# Patient Record
Sex: Male | Born: 1950 | Race: White | Hispanic: No | Marital: Single | Smoking: Current every day smoker
Health system: Southern US, Community
[De-identification: ages and names within clinical notes are randomized; demographics above are authoritative.]

## PROBLEM LIST (undated history)

## (undated) DIAGNOSIS — K219 Gastro-esophageal reflux disease without esophagitis: Secondary | ICD-10-CM

## (undated) DIAGNOSIS — N182 Chronic kidney disease, stage 2 (mild): Secondary | ICD-10-CM

## (undated) DIAGNOSIS — F329 Major depressive disorder, single episode, unspecified: Secondary | ICD-10-CM

## (undated) DIAGNOSIS — J449 Chronic obstructive pulmonary disease, unspecified: Secondary | ICD-10-CM

## (undated) DIAGNOSIS — I2510 Atherosclerotic heart disease of native coronary artery without angina pectoris: Secondary | ICD-10-CM

## (undated) DIAGNOSIS — Z95810 Presence of automatic (implantable) cardiac defibrillator: Secondary | ICD-10-CM

## (undated) DIAGNOSIS — F32A Depression, unspecified: Secondary | ICD-10-CM

## (undated) DIAGNOSIS — I471 Supraventricular tachycardia: Secondary | ICD-10-CM

## (undated) DIAGNOSIS — I4891 Unspecified atrial fibrillation: Secondary | ICD-10-CM

## (undated) DIAGNOSIS — E785 Hyperlipidemia, unspecified: Secondary | ICD-10-CM

## (undated) DIAGNOSIS — G20 Parkinson's disease: Secondary | ICD-10-CM

## (undated) DIAGNOSIS — S72001A Fracture of unspecified part of neck of right femur, initial encounter for closed fracture: Secondary | ICD-10-CM

## (undated) DIAGNOSIS — E1165 Type 2 diabetes mellitus with hyperglycemia: Secondary | ICD-10-CM

## (undated) DIAGNOSIS — I4821 Permanent atrial fibrillation: Secondary | ICD-10-CM

## (undated) DIAGNOSIS — E1140 Type 2 diabetes mellitus with diabetic neuropathy, unspecified: Secondary | ICD-10-CM

## (undated) DIAGNOSIS — I739 Peripheral vascular disease, unspecified: Secondary | ICD-10-CM

## (undated) DIAGNOSIS — R4182 Altered mental status, unspecified: Secondary | ICD-10-CM

## (undated) DIAGNOSIS — E119 Type 2 diabetes mellitus without complications: Secondary | ICD-10-CM

## (undated) DIAGNOSIS — I119 Hypertensive heart disease without heart failure: Secondary | ICD-10-CM

## (undated) DIAGNOSIS — Z09 Encounter for follow-up examination after completed treatment for conditions other than malignant neoplasm: Secondary | ICD-10-CM

## (undated) DIAGNOSIS — A0472 Enterocolitis due to Clostridium difficile, not specified as recurrent: Secondary | ICD-10-CM

## (undated) DIAGNOSIS — Z96642 Presence of left artificial hip joint: Secondary | ICD-10-CM

## (undated) DIAGNOSIS — Z9889 Other specified postprocedural states: Secondary | ICD-10-CM

---

## 2015-06-25 ENCOUNTER — Encounter (HOSPITAL_COMMUNITY): Payer: Self-pay

## 2015-06-25 ENCOUNTER — Inpatient Hospital Stay (HOSPITAL_COMMUNITY)
Admission: AD | Admit: 2015-06-25 | Discharge: 2015-07-01 | DRG: 469 | Disposition: A | Discharge status: Skilled Nursing Facility | Payer: Medicare PPO | Source: Other Acute Inpatient Hospital | Attending: Internal Medicine | Admitting: Internal Medicine

## 2015-06-25 ENCOUNTER — Inpatient Hospital Stay (HOSPITAL_COMMUNITY): Payer: Medicare PPO

## 2015-06-25 ENCOUNTER — Encounter (HOSPITAL_COMMUNITY): Payer: Self-pay | Admitting: Pulmonary Disease

## 2015-06-25 DIAGNOSIS — R6510 Systemic inflammatory response syndrome (SIRS) of non-infectious origin without acute organ dysfunction: Secondary | ICD-10-CM | POA: Diagnosis present

## 2015-06-25 DIAGNOSIS — I82412 Acute embolism and thrombosis of left femoral vein: Secondary | ICD-10-CM | POA: Diagnosis present

## 2015-06-25 DIAGNOSIS — J449 Chronic obstructive pulmonary disease, unspecified: Secondary | ICD-10-CM | POA: Diagnosis present

## 2015-06-25 DIAGNOSIS — F329 Major depressive disorder, single episode, unspecified: Secondary | ICD-10-CM | POA: Diagnosis present

## 2015-06-25 DIAGNOSIS — S72009A Fracture of unspecified part of neck of unspecified femur, initial encounter for closed fracture: Secondary | ICD-10-CM

## 2015-06-25 DIAGNOSIS — E782 Mixed hyperlipidemia: Secondary | ICD-10-CM | POA: Diagnosis present

## 2015-06-25 DIAGNOSIS — G8918 Other acute postprocedural pain: Secondary | ICD-10-CM | POA: Diagnosis not present

## 2015-06-25 DIAGNOSIS — K5900 Constipation, unspecified: Secondary | ICD-10-CM | POA: Diagnosis not present

## 2015-06-25 DIAGNOSIS — R0600 Dyspnea, unspecified: Secondary | ICD-10-CM | POA: Diagnosis present

## 2015-06-25 DIAGNOSIS — W19XXXS Unspecified fall, sequela: Secondary | ICD-10-CM | POA: Diagnosis present

## 2015-06-25 DIAGNOSIS — I2699 Other pulmonary embolism without acute cor pulmonale: Secondary | ICD-10-CM | POA: Diagnosis present

## 2015-06-25 DIAGNOSIS — G20A1 Parkinson's disease without dyskinesia, without mention of fluctuations: Secondary | ICD-10-CM | POA: Diagnosis present

## 2015-06-25 DIAGNOSIS — G20 Parkinson's disease: Secondary | ICD-10-CM | POA: Diagnosis present

## 2015-06-25 DIAGNOSIS — S72001A Fracture of unspecified part of neck of right femur, initial encounter for closed fracture: Principal | ICD-10-CM | POA: Diagnosis present

## 2015-06-25 DIAGNOSIS — I482 Chronic atrial fibrillation: Secondary | ICD-10-CM | POA: Diagnosis present

## 2015-06-25 DIAGNOSIS — N182 Chronic kidney disease, stage 2 (mild): Secondary | ICD-10-CM | POA: Diagnosis present

## 2015-06-25 DIAGNOSIS — K760 Fatty (change of) liver, not elsewhere classified: Secondary | ICD-10-CM | POA: Diagnosis present

## 2015-06-25 DIAGNOSIS — Z7982 Long term (current) use of aspirin: Secondary | ICD-10-CM

## 2015-06-25 DIAGNOSIS — I2601 Septic pulmonary embolism with acute cor pulmonale: Secondary | ICD-10-CM | POA: Diagnosis not present

## 2015-06-25 DIAGNOSIS — J9601 Acute respiratory failure with hypoxia: Secondary | ICD-10-CM | POA: Diagnosis present

## 2015-06-25 DIAGNOSIS — D62 Acute posthemorrhagic anemia: Secondary | ICD-10-CM | POA: Diagnosis present

## 2015-06-25 DIAGNOSIS — R55 Syncope and collapse: Secondary | ICD-10-CM | POA: Diagnosis present

## 2015-06-25 DIAGNOSIS — E1122 Type 2 diabetes mellitus with diabetic chronic kidney disease: Secondary | ICD-10-CM | POA: Diagnosis present

## 2015-06-25 DIAGNOSIS — Z95810 Presence of automatic (implantable) cardiac defibrillator: Secondary | ICD-10-CM | POA: Diagnosis not present

## 2015-06-25 DIAGNOSIS — E872 Acidosis: Secondary | ICD-10-CM | POA: Diagnosis present

## 2015-06-25 DIAGNOSIS — K219 Gastro-esophageal reflux disease without esophagitis: Secondary | ICD-10-CM | POA: Diagnosis present

## 2015-06-25 DIAGNOSIS — I129 Hypertensive chronic kidney disease with stage 1 through stage 4 chronic kidney disease, or unspecified chronic kidney disease: Secondary | ICD-10-CM | POA: Diagnosis present

## 2015-06-25 DIAGNOSIS — Z79899 Other long term (current) drug therapy: Secondary | ICD-10-CM

## 2015-06-25 DIAGNOSIS — I471 Supraventricular tachycardia: Secondary | ICD-10-CM | POA: Diagnosis not present

## 2015-06-25 DIAGNOSIS — I2510 Atherosclerotic heart disease of native coronary artery without angina pectoris: Secondary | ICD-10-CM | POA: Diagnosis present

## 2015-06-25 DIAGNOSIS — R269 Unspecified abnormalities of gait and mobility: Secondary | ICD-10-CM | POA: Diagnosis not present

## 2015-06-25 DIAGNOSIS — S72141D Displaced intertrochanteric fracture of right femur, subsequent encounter for closed fracture with routine healing: Secondary | ICD-10-CM | POA: Diagnosis not present

## 2015-06-25 DIAGNOSIS — I2782 Chronic pulmonary embolism: Secondary | ICD-10-CM

## 2015-06-25 DIAGNOSIS — F17200 Nicotine dependence, unspecified, uncomplicated: Secondary | ICD-10-CM | POA: Diagnosis present

## 2015-06-25 DIAGNOSIS — I82402 Acute embolism and thrombosis of unspecified deep veins of left lower extremity: Secondary | ICD-10-CM | POA: Diagnosis not present

## 2015-06-25 HISTORY — DX: Atherosclerotic heart disease of native coronary artery without angina pectoris: I25.10

## 2015-06-25 HISTORY — DX: Chronic kidney disease, stage 2 (mild): N18.2

## 2015-06-25 HISTORY — DX: Parkinson's disease: G20

## 2015-06-25 HISTORY — DX: Supraventricular tachycardia: I47.1

## 2015-06-25 HISTORY — DX: Fracture of unspecified part of neck of right femur, initial encounter for closed fracture: S72.001A

## 2015-06-25 HISTORY — DX: Chronic obstructive pulmonary disease, unspecified: J44.9

## 2015-06-25 HISTORY — DX: Gastro-esophageal reflux disease without esophagitis: K21.9

## 2015-06-25 HISTORY — DX: Hyperlipidemia, unspecified: E78.5

## 2015-06-25 HISTORY — DX: Depression, unspecified: F32.A

## 2015-06-25 HISTORY — DX: Presence of automatic (implantable) cardiac defibrillator: Z95.810

## 2015-06-25 HISTORY — DX: Unspecified atrial fibrillation: I48.91

## 2015-06-25 HISTORY — DX: Major depressive disorder, single episode, unspecified: F32.9

## 2015-06-25 LAB — BASIC METABOLIC PANEL
Anion gap: 8 (ref 5–15)
BUN: 21 mg/dL — AB (ref 6–20)
CALCIUM: 8.4 mg/dL — AB (ref 8.9–10.3)
CHLORIDE: 101 mmol/L (ref 101–111)
CO2: 27 mmol/L (ref 22–32)
CREATININE: 1.28 mg/dL — AB (ref 0.61–1.24)
GFR calc Af Amer: >60 mL/min (ref >60)
GFR calc non Af Amer: 57 mL/min — ABNORMAL LOW (ref >60)
Glucose, Bld: 132 mg/dL — ABNORMAL HIGH (ref 65–99)
Potassium: 4.3 mmol/L (ref 3.5–5.1)
SODIUM: 136 mmol/L (ref 135–145)

## 2015-06-25 LAB — CBC
HCT: 41.2 % (ref 39.0–52.0)
HEMOGLOBIN: 13.6 g/dL (ref 13.0–17.0)
MCH: 31.6 pg (ref 26.0–34.0)
MCHC: 33 g/dL (ref 30.0–36.0)
MCV: 95.8 fL (ref 78.0–100.0)
PLATELETS: 201 10*3/uL (ref 150–400)
RBC: 4.3 MIL/uL (ref 4.22–5.81)
RDW: 13.2 % (ref 11.5–15.5)
WBC: 11.8 10*3/uL — ABNORMAL HIGH (ref 4.0–10.5)

## 2015-06-25 LAB — GLUCOSE, CAPILLARY
GLUCOSE-CAPILLARY: 127 mg/dL — AB (ref 65–99)
Glucose-Capillary: 127 mg/dL — ABNORMAL HIGH (ref 65–99)

## 2015-06-25 LAB — COMPREHENSIVE METABOLIC PANEL
ALBUMIN: 3.3 g/dL — AB (ref 3.5–5.0)
ALT: 22 U/L (ref 17–63)
ANION GAP: 8 (ref 5–15)
AST: 22 U/L (ref 15–41)
Alkaline Phosphatase: 32 U/L — ABNORMAL LOW (ref 38–126)
BUN: 22 mg/dL — ABNORMAL HIGH (ref 6–20)
CHLORIDE: 103 mmol/L (ref 101–111)
CO2: 26 mmol/L (ref 22–32)
Calcium: 8.5 mg/dL — ABNORMAL LOW (ref 8.9–10.3)
Creatinine, Ser: 1.26 mg/dL — ABNORMAL HIGH (ref 0.61–1.24)
GFR calc non Af Amer: 58 mL/min — ABNORMAL LOW (ref >60)
GLUCOSE: 140 mg/dL — AB (ref 65–99)
Potassium: 4.1 mmol/L (ref 3.5–5.1)
SODIUM: 137 mmol/L (ref 135–145)
Total Bilirubin: 0.9 mg/dL (ref 0.3–1.2)
Total Protein: 6.2 g/dL — ABNORMAL LOW (ref 6.5–8.1)

## 2015-06-25 LAB — HEPARIN LEVEL (UNFRACTIONATED)
Heparin Unfractionated: 0.17 IU/mL — ABNORMAL LOW (ref 0.30–0.70)
Heparin Unfractionated: 0.51 IU/mL (ref 0.30–0.70)
Heparin Unfractionated: 0.37 IU/mL (ref 0.30–0.70)

## 2015-06-25 LAB — LACTIC ACID, PLASMA: LACTIC ACID, VENOUS: 1.4 mmol/L (ref 0.5–2.0)

## 2015-06-25 LAB — PHOSPHORUS: PHOSPHORUS: 3.1 mg/dL (ref 2.5–4.6)

## 2015-06-25 LAB — TROPONIN I
TROPONIN I: 0.06 ng/mL — AB (ref <0.031)
TROPONIN I: 0.06 ng/mL — AB (ref <0.031)
TROPONIN I: 0.04 ng/mL — AB (ref <0.031)

## 2015-06-25 LAB — MAGNESIUM: Magnesium: 1.9 mg/dL (ref 1.7–2.4)

## 2015-06-25 LAB — ANTITHROMBIN III: AntiThromb III Func: 88 % (ref 75–120)

## 2015-06-25 LAB — BRAIN NATRIURETIC PEPTIDE: B Natriuretic Peptide: 200.8 pg/mL — ABNORMAL HIGH (ref 0.0–100.0)

## 2015-06-25 LAB — MRSA PCR SCREENING: MRSA BY PCR: NEGATIVE

## 2015-06-25 MED ORDER — FENTANYL CITRATE (PF) 100 MCG/2ML IJ SOLN
25.0000 ug | INTRAMUSCULAR | Status: DC | PRN
  Administered 2015-06-25: 100 ug via INTRAVENOUS
  Filled 2015-06-25: qty 2

## 2015-06-25 MED ORDER — AMIODARONE HCL 100 MG PO TABS
200.0000 mg | ORAL_TABLET | Freq: Every day | ORAL | Status: DC
  Administered 2015-06-25 – 2015-07-01 (×7): 200 mg via ORAL
  Filled 2015-06-25: qty 2
  Filled 2015-06-25 (×2): qty 1
  Filled 2015-06-25: qty 2
  Filled 2015-06-25: qty 1
  Filled 2015-06-25: qty 2
  Filled 2015-06-25: qty 1

## 2015-06-25 MED ORDER — CARVEDILOL 6.25 MG PO TABS
6.2500 mg | ORAL_TABLET | Freq: Two times a day (BID) | ORAL | Status: DC
Start: 2015-06-25 — End: 2015-07-02
  Administered 2015-06-25 – 2015-07-01 (×13): 6.25 mg via ORAL
  Filled 2015-06-25 (×14): qty 1

## 2015-06-25 MED ORDER — SODIUM CHLORIDE 0.9 % IV SOLN
INTRAVENOUS | Status: DC
Start: 2015-06-25 — End: 2015-06-25
  Administered 2015-06-25: 03:00:00 via INTRAVENOUS

## 2015-06-25 MED ORDER — PERFLUTREN LIPID MICROSPHERE
1.0000 mL | INTRAVENOUS | Status: AC | PRN
Start: 2015-06-25 — End: 2015-06-25
  Administered 2015-06-25: 2 mL via INTRAVENOUS
  Filled 2015-06-25 (×2): qty 10

## 2015-06-25 MED ORDER — ASPIRIN EC 325 MG PO TBEC
325.0000 mg | DELAYED_RELEASE_TABLET | Freq: Every day | ORAL | Status: DC
  Administered 2015-06-25 – 2015-07-01 (×7): 325 mg via ORAL
  Filled 2015-06-25 (×8): qty 1

## 2015-06-25 MED ORDER — OMEGA-3-ACID ETHYL ESTERS 1 G PO CAPS
1.0000 g | ORAL_CAPSULE | Freq: Three times a day (TID) | ORAL | Status: DC
  Administered 2015-06-25 – 2015-07-01 (×19): 1 g via ORAL
  Filled 2015-06-25 (×20): qty 1

## 2015-06-25 MED ORDER — HEPARIN BOLUS VIA INFUSION
3000.0000 [IU] | Freq: Once | INTRAVENOUS | Status: AC
  Administered 2015-06-25: 3000 [IU] via INTRAVENOUS
  Filled 2015-06-25: qty 3000

## 2015-06-25 MED ORDER — PANTOPRAZOLE SODIUM 40 MG PO TBEC
40.0000 mg | DELAYED_RELEASE_TABLET | Freq: Every day | ORAL | Status: DC
  Administered 2015-06-25 – 2015-07-01 (×7): 40 mg via ORAL
  Filled 2015-06-25 (×8): qty 1

## 2015-06-25 MED ORDER — MOMETASONE FURO-FORMOTEROL FUM 100-5 MCG/ACT IN AERO
2.0000 | INHALATION_SPRAY | Freq: Two times a day (BID) | RESPIRATORY_TRACT | Status: DC
  Administered 2015-06-25 – 2015-07-01 (×12): 2 via RESPIRATORY_TRACT
  Filled 2015-06-25 (×2): qty 8.8

## 2015-06-25 MED ORDER — MAGNESIUM SULFATE 2 GM/50ML IV SOLN
2.0000 g | Freq: Once | INTRAVENOUS | Status: AC
  Administered 2015-06-25: 2 g via INTRAVENOUS
  Filled 2015-06-25: qty 50

## 2015-06-25 MED ORDER — SPIRONOLACTONE 25 MG PO TABS
25.0000 mg | ORAL_TABLET | Freq: Every day | ORAL | Status: DC
  Administered 2015-06-25 – 2015-07-01 (×7): 25 mg via ORAL
  Filled 2015-06-25 (×7): qty 1

## 2015-06-25 MED ORDER — ONDANSETRON HCL 4 MG/2ML IJ SOLN: 4.0000 mg | Freq: Four times a day (QID) | INTRAMUSCULAR | Status: DC | PRN

## 2015-06-25 MED ORDER — CETYLPYRIDINIUM CHLORIDE 0.05 % MT LIQD
7.0000 mL | Freq: Two times a day (BID) | OROMUCOSAL | Status: DC
  Administered 2015-06-25 – 2015-07-01 (×12): 7 mL via OROMUCOSAL

## 2015-06-25 MED ORDER — TIOTROPIUM BROMIDE MONOHYDRATE 18 MCG IN CAPS
18.0000 ug | ORAL_CAPSULE | Freq: Every day | RESPIRATORY_TRACT | Status: DC
  Administered 2015-06-25 – 2015-07-01 (×7): 18 ug via RESPIRATORY_TRACT
  Filled 2015-06-25 (×2): qty 5

## 2015-06-25 MED ORDER — ACETAMINOPHEN 325 MG PO TABS: 650.0000 mg | ORAL_TABLET | ORAL | Status: DC | PRN

## 2015-06-25 MED ORDER — SIMVASTATIN 20 MG PO TABS
20.0000 mg | ORAL_TABLET | Freq: Every day | ORAL | Status: DC
  Administered 2015-06-25 – 2015-07-01 (×7): 20 mg via ORAL
  Filled 2015-06-25 (×7): qty 1

## 2015-06-25 MED ORDER — SODIUM CHLORIDE 0.9 % IV SOLN: 250.0000 mL | INTRAVENOUS | Status: DC | PRN

## 2015-06-25 MED ORDER — CARBIDOPA-LEVODOPA ER 50-200 MG PO TBCR
2.0000 | EXTENDED_RELEASE_TABLET | Freq: Two times a day (BID) | ORAL | Status: DC
  Administered 2015-06-25 – 2015-06-27 (×5): 2 via ORAL
  Filled 2015-06-25 (×6): qty 2

## 2015-06-25 MED ORDER — MORPHINE SULFATE (PF) 2 MG/ML IV SOLN
2.0000 mg | INTRAVENOUS | Status: DC | PRN
  Administered 2015-06-25 (×2): 4 mg via INTRAVENOUS
  Administered 2015-06-25: 2 mg via INTRAVENOUS
  Administered 2015-06-26 (×2): 4 mg via INTRAVENOUS
  Administered 2015-06-27 – 2015-06-28 (×4): 2 mg via INTRAVENOUS
  Filled 2015-06-25: qty 1
  Filled 2015-06-25: qty 2
  Filled 2015-06-25 (×2): qty 1
  Filled 2015-06-25: qty 2
  Filled 2015-06-25 (×2): qty 1
  Filled 2015-06-25 (×2): qty 2
  Filled 2015-06-25: qty 1

## 2015-06-25 MED ORDER — FENOFIBRATE 160 MG PO TABS
160.0000 mg | ORAL_TABLET | Freq: Every day | ORAL | Status: DC
  Administered 2015-06-25 – 2015-07-01 (×7): 160 mg via ORAL
  Filled 2015-06-25 (×7): qty 1

## 2015-06-25 MED ORDER — HEPARIN (PORCINE) IN NACL 100-0.45 UNIT/ML-% IJ SOLN
2200.0000 [IU]/h | INTRAMUSCULAR | Status: DC
  Administered 2015-06-25 (×2): 2000 [IU]/h via INTRAVENOUS
  Administered 2015-06-25: 1600 [IU]/h via INTRAVENOUS
  Administered 2015-06-26: 2200 [IU]/h via INTRAVENOUS
  Filled 2015-06-25 (×5): qty 250

## 2015-06-25 NOTE — Progress Notes (Signed)
[phone] called Elink MD, pt c/o 8/10 pain in right hip. Pt states fentanyl did not provide relief. Orders given for Morphine 2-4 mg q 3 PRN.

## 2015-06-25 NOTE — Progress Notes (Signed)
eLink Physician-Brief Progress Note Patient Name: Jonathan SosDavid B Stafford DOB: [birth year] MRN: [id number]   Date of Service  06/25/2015  HPI/Events of Note  New arrival from [facility] Syncope today > hip fracture > ER > found to have large PE with RV strain HD stable, but requiring venturi mask for to maintain O2 saturation   eICU Interventions  On ground team to evaluate Will place basic admission orders, heparin per pharmacy, O2, pain meds, basic labs     Intervention Category Evaluation Type: New Patient Evaluation  Max FickleDouglas Jahnyla Parrillo 06/25/2015, 2:26 AM

## 2015-06-25 NOTE — Progress Notes (Signed)
Transferred -in from medical ICU awake and alert.

## 2015-06-25 NOTE — Consult Note (Signed)
ORTHOPAEDIC CONSULTATION  REQUESTING PHYSICIAN: Marshell Garfinkel, MD  Chief Complaint: Right hip fracture.  HPI: Jonathan Stafford is a 64 y.o. male who complains of  a syncopal fall onto his right side with pain. He has a history of PE and is on a heparin drip for this which was discovered a day ago. He has a history of A. fib.  Past Medical History  Diagnosis Date  . Atrial fibrillation ([place])   . CAD (coronary artery disease)   . HLD (hyperlipidemia)   . COPD (chronic obstructive pulmonary disease) ([place])   . Chronic kidney disease (CKD), stage II (mild)   . SVT (supraventricular tachycardia) ([place])   . Presence of combination internal cardiac defibrillator (ICD) and pacemaker   . GERD (gastroesophageal reflux disease)   . Parkinson's disease ([place])   . Depression    History reviewed. No pertinent past surgical history. Social History   Social History  . Marital Status: Single    Spouse Name: N/A  . Number of Children: N/A  . Years of Education: N/A   Social History Main Topics  . Smoking status: Current Every Day Smoker  . Smokeless tobacco: None  . Alcohol Use: None  . Drug Use: None  . Sexual Activity: Not Asked   Other Topics Concern  . None   Social History Narrative   History reviewed. No pertinent family history. No Known Allergies Prior to Admission medications   Medication Sig Start Date End Date Taking? Authorizing Provider  amiodarone (PACERONE) 200 MG tablet Take 200 mg by mouth daily.   Yes Historical Provider, MD  aspirin 325 MG EC tablet Take 325 mg by mouth daily.   Yes Historical Provider, MD  carbidopa-levodopa (SINEMET CR) 50-200 MG tablet Take 2 tablets by mouth 3 (three) times daily.    Yes Historical Provider, MD  carvedilol (COREG) 6.25 MG tablet Take 6.25 mg by mouth 2 (two) times daily with a meal.   Yes Historical Provider, MD  fenofibrate (TRICOR) 145 MG tablet Take 145 mg by mouth daily.   Yes Historical Provider, MD    Fluticasone-Salmeterol (ADVAIR) 250-50 MCG/DOSE AEPB Inhale 1 puff into the lungs 2 (two) times daily.   Yes Historical Provider, MD  omega-3 acid ethyl esters (LOVAZA) 1 G capsule Take 1 g by mouth 3 (three) times daily.   Yes Historical Provider, MD  pantoprazole (PROTONIX) 40 MG tablet Take 40 mg by mouth daily.   Yes Historical Provider, MD  ranolazine (RANEXA) 500 MG 12 hr tablet Take 500 mg by mouth 2 (two) times daily.   Yes Historical Provider, MD  simvastatin (ZOCOR) 20 MG tablet Take 20 mg by mouth daily.   Yes Historical Provider, MD  spironolactone (ALDACTONE) 25 MG tablet Take 25 mg by mouth daily.   Yes Historical Provider, MD  sucralfate (CARAFATE) 1 G tablet Take 1 g by mouth 4 (four) times daily.   Yes Historical Provider, MD  tiotropium (SPIRIVA) 18 MCG inhalation capsule Place 18 mcg into inhaler and inhale daily.   Yes Historical Provider, MD  Vortioxetine HBr (BRINTELLIX) 20 MG TABS Take 20 mg by mouth daily.   Yes Historical Provider, MD  zolpidem (AMBIEN) 5 MG tablet Take 5-10 mg by mouth at bedtime as needed for sleep.   Yes Historical Provider, MD   No results found.  Positive ROS: All other systems have been reviewed and were otherwise negative with the exception of those mentioned in the HPI and as above.  Labs cbc  Recent Labs  06/25/15 0805  WBC 11.8*  HGB 13.6  HCT 41.2  PLT 201    Labs inflam No results for input(s): CRP in the last 72 hours.  Invalid input(s): ESR  Labs coag No results for input(s): INR, PTT in the last 72 hours.  Invalid input(s): PT   Recent Labs  06/25/15 0341 06/25/15 0805  NA 137 136  K 4.1 4.3  CL 103 101  CO2 26 27  GLUCOSE 140* 132*  BUN 22* 21*  CREATININE 1.26* 1.28*  CALCIUM 8.5* 8.4*    Physical Exam: Filed Vitals:   06/25/15 1000  BP: 105/57  Pulse: 83  Temp:   Resp: 17   General: Alert, no acute distress Cardiovascular: No pedal edema Respiratory: No cyanosis, no use of accessory  musculature GI: No organomegaly, abdomen is soft and non-tender Skin: No lesions in the area of chief complaint other than those listed below in MSK exam.  Neurologic: Sensation intact distally Psychiatric: Patient is competent for consent with normal mood and affect Lymphatic: No axillary or cervical lymphadenopathy  MUSCULOSKELETAL:  Right lower sham E compartments are soft mild ecchymosis over his lateral thigh. He is distally neurovascularly intact. No increased work of breathing. Other extremities are atraumatic with painless ROM and NVI.  Assessment: Right femoral neck fracture  Plan: I discussed this case with the critical care team specifically Dr. Felix Ahmadi who they do not require any additional pain workup and are comfortable with holding heparin on the day of surgery. We will restarted after surgery. Weight Bearing Status: WBAT post op PT VTE px: SCD's and heparin post op   Renette Butters, MD Cell [phone]   06/25/2015 10:46 AM

## 2015-06-25 NOTE — Progress Notes (Signed)
  Echocardiogram 2D Echocardiogram with Definity has been performed.  Nolon RodBrown, Tony 06/25/2015, 1:48 PM

## 2015-06-25 NOTE — Progress Notes (Signed)
ANTICOAGULATION CONSULT NOTE - Follow Up Consult  Pharmacy Consult for heparin Indication: pulmonary embolus   Labs:  Recent Labs  06/25/15 0341  HEPARINUNFRC 0.17*     Assessment: 64yo male subtherapeutic on heparin with initial dosing for PE.  Goal of Therapy:  Heparin level 0.3-0.7 units/ml   Plan:  Will bolus with heparin 3000 units and increase gtt by 4 units/kg/hr to 2000 units/hr and check level in 6hr.  Vernard GamblesVeronda Rosilyn Coachman, PharmD, BCPS  06/25/2015,4:25 AM

## 2015-06-25 NOTE — Progress Notes (Addendum)
Verbal orders given per ortho to NOT ambulate pt before surgery tomorrow. Molli KnockYacoub made aware.

## 2015-06-25 NOTE — Progress Notes (Signed)
Orders given to ambulate patient. I will ambulate with PT after pt has had lower extremity doppler has been completed.

## 2015-06-25 NOTE — Progress Notes (Addendum)
Returned call to [facility]. Nurse still unavailable to take report. Will call back in 15 min

## 2015-06-25 NOTE — Progress Notes (Signed)
PULMONARY / CRITICAL CARE MEDICINE   Name: Jonathan Stafford MRN: [id number] DOB: Nov 27, 1950    ADMISSION DATE:  06/25/2015 CONSULTATION DATE:  06/25/2015  REFERRING MD :  Duke Salvia EDP  CHIEF COMPLAINT:  Syncope  INITIAL PRESENTATION: 64 year old male with PMH of cardiovascular disease including tachyarrhythmia. Has had progressive SOB over the past few weeks. Syncopal episode 10/25. Found to have large PE with significant clot burden and increased RV/LV ratio. Transferred to [facility] for possible EKOS.    STUDIES:  CTA chest 10/25 > Large volume emboli throughout bilateral lungs. RV/LV ratio 1.09. Underlying COPD. Concern for cirrhosis. DG hip 10/25 > Mildly displaced, non-comminuted, fracture of R femoral neck with mild carus angulation.   SIGNIFICANT EVENTS: 10/25 > presented to [facility] with syncope. PE diagnosed 10/26 > transfer to [facility] for possible EKOS 10/26 > Transfer to SDU and to [facility]  HISTORY OF PRESENT ILLNESS:  64 year old male with PMH as below, which includes SVT and Atrial fibrillation with indwelling pacemaker/defibrillator, CAD, COPD, CKD2, and parkinson's disease. He was recently admitted to [facility] 05/2015 for ablation. He was there for 3 days, and almost immediately after being discharged he reports "not feeling right".  Since that time his has had significant functional decline. For the past week or two SOB has developed, which greatly reduced his functional status. He has been unable to even leave the house, and more recently (last couple of days) he had been unable to ambulate 10 feet without becoming markedly short of breath. He denies associated chest pain, fevers, chills, or cough. 10/25 he suffered a syncopal episode. He denies prodrome, and states that he just fell out out of nowhere. He did lose consciousness. He does not recall hitting his head during fall. He presented to [facility] ED after syncope. CTA of the chest was performed and found Large volume  emboli throughout bilateral lungs. RV/LV ratio 1.09. He was hypoxemic requiring venti mask to keep sats in 90s. Also due to fall he suffered R hip fracture.   SUBJECTIVE: No events overnight.  VITAL SIGNS: Temp:  [98.1 F (36.7 C)-99.4 F (37.4 C)] 98.1 F (36.7 C) (10/26 0744) Pulse Rate:  [83-103] 83 (10/26 1000) Resp:  [14-23] 17 (10/26 1000) BP: (96-150)/(57-88) 105/57 mmHg (10/26 1000) SpO2:  [92 %-97 %] 95 % (10/26 1000) FiO2 (%):  [50 %] 50 % (10/26 0215) Weight:  [105.5 kg (232 lb 9.4 oz)] 105.5 kg (232 lb 9.4 oz) (10/26 0252) HEMODYNAMICS:   VENTILATOR SETTINGS: Vent Mode:  [-]  FiO2 (%):  [50 %] 50 % INTAKE / OUTPUT:  Intake/Output Summary (Last 24 hours) at 06/25/15 1035 Last data filed at 06/25/15 0700  Gross per 24 hour  Intake 357.62 ml  Output    925 ml  Net -567.38 ml    PHYSICAL EXAMINATION: General:  Male appear older than stated age Neuro:  Alert, oriented, non-focal. Mild tremor Head: [place]/AT. EENT:  No JVD. PERRL, EOM-I. Cardiovascular:  RRR, no MRG Lungs:  Clear bilateral breath sounds Abdomen:  Soft, non-tender, non-distended Musculoskeletal:  Pain R hip, peripheral pulses intact Skin:  Grossly intact  LABS:  CBC  Recent Labs Lab 06/25/15 0805  WBC 11.8*  HGB 13.6  HCT 41.2  PLT 201   Coag's No results for input(s): APTT, INR in the last 168 hours. BMET  Recent Labs Lab 06/25/15 0341 06/25/15 0805  NA 137 136  K 4.1 4.3  CL 103 101  CO2 26 27  BUN 22* 21*  CREATININE 1.26* 1.28*  GLUCOSE 140* 132*   Electrolytes  Recent Labs Lab 06/25/15 0341 06/25/15 0805  CALCIUM 8.5* 8.4*  MG 1.9  --   PHOS 3.1  --    Sepsis Markers  Recent Labs Lab 06/25/15 0341  LATICACIDVEN 1.4   ABG No results for input(s): PHART, PCO2ART, PO2ART in the last 168 hours. Liver Enzymes  Recent Labs Lab 06/25/15 0341  AST 22  ALT 22  ALKPHOS 32*  BILITOT 0.9  ALBUMIN 3.3*   Cardiac Enzymes  Recent Labs Lab 06/25/15 0341  06/25/15 0805  TROPONINI 0.06* 0.06*   Glucose  Recent Labs Lab 06/25/15 0227 06/25/15 0740  GLUCAP 127* 127*   Imaging No results found.  ASSESSMENT / PLAN:  PULMONARY A: Acute hypoxemic respiratory failure secondary to large volume pulmonary emboli.  COPD without acute exacerbation.   P:   Supplemental O2 as needed to maintain SpO2 greater than 92% Continue home advair, spiriva Smoking cessation. Will not start coumadin until after the hip is addressed.  CARDIOVASCULAR A:  Atrial fibrillation s/p recent ablation (currently sinus) Syncope with history of same, cardiogenic in past, suspect now secondary to PE. HTN H/o CAD, HLD Pacemaker, defibrillator in-situ  P:  Telemetry monitoring. Interrogate pacemaker to ensure no cardiac cause of syncope. Continue home amiodarone, ASA, coreg, fenofibrate, simvastatin. Echo today and pending.  RENAL A:   CKD stage 2, unclear if acute component. SCr 1.3 on admission.  High AG metabolic acidosis  P:   Follow Bmet. Replace electrolytes as indicated. KVO IVF.  GASTROINTESTINAL A:   GERD ?Cirrhosis noted on CTA chest  P:   Heart healthy diet. Protonix.  HEMATOLOGIC A:   Bilateral submassive pulmonary emboli > provoked in setting of recent hospitalization and sedentary.  P:  Heparin per pharmacy. Trend coag's. BLE dopplers pending. No lytic therapy.  MUSCULOSKELETAL  A:   R hip fracture  P:   Ortho consult called. Fentanyl for pain control.  INFECTIOUS A:   SIRS, no obvious source Leukocytosis (18 on admission)  P:   Monitor off ABX Trend WBC and fever curve.  ENDOCRINE A:   DM managed by diet  P:   Follow glucose on chemistries  NEUROLOGIC A:   Parkinson's disease Depression  P:   RASS goal: 0 Hold sedating meds Continue carbidopa/levadopa.  Hold brintellix > dizziness side effect  FAMILY  - Updates: patient and son updated at bedside  - Inter-disciplinary family meet or  Palliative Care meeting due by:  11/2  [facility] 10/27.  Alyson ReedyWesam G. Jedediah Noda, M.D. [facility] Pulmonary/Critical Care Medicine. Pager: [phone]. After hours pager: [phone].  06/25/2015 10:35 AM

## 2015-06-25 NOTE — Progress Notes (Signed)
ANTICOAGULATION CONSULT NOTE - Initial Consult  Pharmacy Consult for Heparin  Indication: pulmonary embolus  No Known Allergies  Vital Signs: Temp: 99.1 F (37.3 C) (10/26 0230) Temp Source: Oral (10/26 0230) BP: 150/88 mmHg (10/26 0215) Pulse Rate: 97 (10/26 0215)  Labs: Outside hospital   Assessment: 64 y/[facility], heparin is currently infusing at 1600 units/hr, looks like it was started around 1430 on 10/25. Hgb/INR are WNL from [facility].   Goal of Therapy:  Heparin level 0.3-0.7 units/ml Monitor platelets by anticoagulation protocol: Yes   Plan:  -Continue heparin at 1600 units/hr -Will check a HL now -Daily CBC/HL -Monitor for bleeding  Abran DukeLedford, Vaughan Garfinkle 06/25/2015,2:30 AM

## 2015-06-25 NOTE — Progress Notes (Signed)
ANTICOAGULATION CONSULT NOTE - Follow Up Consult  Pharmacy Consult for Heparin Indication: pulmonary embolus  No Known Allergies  Patient Measurements: Height: 5\' 11"  (180.3 cm) Weight: 232 lb 9.4 oz (105.5 kg) IBW/kg (Calculated) : 75.3 Heparin Dosing Weight:   Vital Signs: Temp: 98.1 F (36.7 C) (10/26 0744) Temp Source: Oral (10/26 0744) BP: 105/57 mmHg (10/26 1000) Pulse Rate: 83 (10/26 1000)  Labs:  Recent Labs  06/25/15 0341 06/25/15 0805 06/25/15 1010  HGB  --  13.6  --   HCT  --  41.2  --   PLT  --  201  --   HEPARINUNFRC 0.17*  --  0.51  CREATININE 1.26* 1.28*  --   TROPONINI 0.06* 0.06*  --     Estimated Creatinine Clearance: 71.1 mL/min (by C-G formula based on Cr of 1.28).   Medications:  Infusions:  . heparin 2,000 Units/hr (06/25/15 0600)   Assessment: 64 year old male on IV heparin for submassive PE.   Heparin level is therapeutic this AM on 2000 units/hr.   Goal of Therapy:  Heparin level 0.3-0.7 units/ml Monitor platelets by anticoagulation protocol: Yes   Plan:  Continue heparin at 2000 units/hr. Recheck heparin level in 6 hours.  Daily heparin level and CBC.  Monitor for bleeding.   Link SnufferJessica Woodley Petzold, PharmD, BCPS Clinical Pharmacist [phone] 06/25/2015,11:32 AM

## 2015-06-25 NOTE — Progress Notes (Signed)
ANTICOAGULATION CONSULT NOTE - Follow Up Consult  Pharmacy Consult for Heparin Indication: pulmonary embolus  No Known Allergies  Patient Measurements: Height: 5\' 11"  (180.3 cm) Weight: 232 lb 9.4 oz (105.5 kg) IBW/kg (Calculated) : 75.3   Vital Signs: Temp: 98.9 F (37.2 C) (10/26 1936) Temp Source: Oral (10/26 1936) BP: 111/61 mmHg (10/26 1936) Pulse Rate: 84 (10/26 2100)  Labs:  Recent Labs  06/25/15 0341 06/25/15 0805 06/25/15 1010 06/25/15 1410 06/25/15 2056  HGB  --  13.6  --   --   --   HCT  --  41.2  --   --   --   PLT  --  201  --   --   --   HEPARINUNFRC 0.17*  --  0.51  --  0.37  CREATININE 1.26* 1.28*  --   --   --   TROPONINI 0.06* 0.06*  --  0.04*  --     Estimated Creatinine Clearance: 71.1 mL/min (by C-G formula based on Cr of 1.28).   Medications:  Scheduled:  . amiodarone  200 mg Oral Daily  . antiseptic oral rinse  7 mL Mouth Rinse BID  . aspirin  325 mg Oral Daily  . carbidopa-levodopa  2 tablet Oral BID  . carvedilol  6.25 mg Oral BID WC  . fenofibrate  160 mg Oral Daily  . mometasone-formoterol  2 puff Inhalation BID  . omega-3 acid ethyl esters  1 g Oral TID  . pantoprazole  40 mg Oral Daily  . simvastatin  20 mg Oral Daily  . spironolactone  25 mg Oral Daily  . tiotropium  18 mcg Inhalation Daily   Infusions:  . heparin 2,000 Units/hr (06/25/15 1529)    Assessment: 64 year old male on IV heparin for submassive PE.    Results for Jonathan Stafford, Jonathan Stafford (MRN [id number]) as of 06/25/2015 21:27  Ref. Range 06/25/2015 10:10 06/25/2015 20:56  Heparin Unfractionated Latest Ref Range: 0.30-0.70 IU/mL 0.51 0.37    Goal of Therapy:  Heparin level 0.3-0.7 units/ml Monitor platelets by anticoagulation protocol: Yes   Plan:  Increase heparin to 2050 units/hr Continue Daily HL and CBC Monitor s/sxs bleeding  Elige KoLatousha P. Ciarrah Rae, Pharm.D., BCPS Clinical Pharmacist [phone] Pager 06/25/2015 9:33 PM

## 2015-06-25 NOTE — Progress Notes (Signed)
eLink Physician-Brief Progress Note Patient Name: Jonathan SosDavid B Stafford DOB: [birth year] MRN: [id number]   Date of Service  06/25/2015  HPI/Events of Note  Hip pain, fentanyl ineffective  eICU Interventions  Morphine PRN D/c fentanyl     Intervention Category Intermediate Interventions: Pain - evaluation and management  Max FickleDouglas Kelin Nixon 06/25/2015, 4:28 AM

## 2015-06-25 NOTE — Progress Notes (Signed)
Attempted to call report to 2H. Nurse unavailable. Will call back in 20 minutes.

## 2015-06-25 NOTE — H&P (Signed)
PULMONARY / CRITICAL CARE MEDICINE   Name: Jonathan Stafford MRN: [id number] DOB: [birth year]    ADMISSION DATE:  06/25/2015 CONSULTATION DATE:  06/25/2015  REFERRING MD :  [facility] Salvia EDP  CHIEF COMPLAINT:  Syncope  INITIAL PRESENTATION: 64 year old male with PMH of cardiovascular disease including tachyarrhythmia. Has had progressive SOB over the past few weeks. Syncopal episode 10/25. Found to have large PE with significant clot burden and increased RV/LV ratio. Transferred to [facility] for possible EKOS.    STUDIES:  CTA chest 10/25 > Large volume emboli throughout bilateral lungs. RV/LV ratio 1.09. Underlying COPD. Concern for cirrhosis. DG hip 10/25 > Mildly displaced, non-comminuted, fracture of R femoral neck with mild carus angulation.   SIGNIFICANT EVENTS: 10/25 > presented to [facility] with syncope. PE diagnosed 10/26 > transfer to [facility] for possible EKOS   HISTORY OF PRESENT ILLNESS:  64 year old male with PMH as below, which includes SVT and Atrial fibrillation with indwelling pacemaker/defibrillator, CAD, COPD, CKD2, and parkinson's disease. He was recently admitted to [facility] regional 05/2015 for ablation. He was there for 3 days, and almost immediately after being discharged he reports "not feeling right".  Since that time his has had significant functional decline. For the past week or two SOB has developed, which greatly reduced his functional status. He has been unable to even leave the house, and more recently (last couple of days) he had been unable to ambulate 10 feet without becoming markedly short of breath. He denies associated chest pain, fevers, chills, or cough. 10/25 he suffered a syncopal episode. He denies prodrome, and states that he just fell out out of nowhere. He did lose consciousness. He does not recall hitting his head during fall. He presented to [facility] ED after syncope. CTA of the chest was performed and found Large volume emboli throughout bilateral lungs.  RV/LV ratio 1.09. He was hypoxemic requiring venti mask to keep sats in 90s. Also due to fall he suffered R hip fracture.   PAST MEDICAL HISTORY :   has a past medical history of Atrial fibrillation (HCC); CAD (coronary artery disease); HLD (hyperlipidemia); COPD (chronic obstructive pulmonary disease) (HCC); Chronic kidney disease (CKD), stage II (mild); SVT (supraventricular tachycardia) (HCC); Presence of combination internal cardiac defibrillator (ICD) and pacemaker; GERD (gastroesophageal reflux disease); Parkinson's disease (HCC); and Depression.  has no past surgical history on file. Prior to Admission medications   Not on File   No Known Allergies  FAMILY HISTORY:  has no family status information on file.  SOCIAL HISTORY:  reports that he has been smoking.  He does not have any smokeless tobacco history on file.  REVIEW OF SYSTEMS:   Review of Systems:   Bolds are positive  Constitutional: weight loss, gain, night sweats, Fevers, chills, fatigue .  HEENT: headaches, Sore throat, sneezing, nasal congestion, post nasal drip, Difficulty swallowing, Tooth/dental problems, visual complaints visual changes, ear ache CV:  chest pain, radiates: ,Orthopnea, PND, swelling in lower extremities, dizziness, palpitations, syncope.  GI  heartburn, indigestion, abdominal pain, nausea, vomiting, diarrhea, change in bowel habits, loss of appetite, bloody stools.  Resp: cough, productive: , hemoptysis, dyspnea on exertion, chest pain, pleuritic.  Skin: rash or itching or icterus GU: dysuria, change in color of urine, urgency or frequency. flank pain, hematuria  MS: joint pain or swelling. decreased range of motion  Psych: change in mood or affect. depression or anxiety.  Neuro: difficulty with speech, weakness, numbness, ataxia    SUBJECTIVE:  VITAL SIGNS: Temp:  [99.1 F (37.3 C)] 99.1 F (37.3 C) (10/26 0230) Pulse Rate:  [93-97] 94 (10/26 0245) Resp:  [16-23] 16 (10/26 0245) BP:  (117-150)/(70-88) 123/75 mmHg (10/26 0245) SpO2:  [95 %-97 %] 95 % (10/26 0245) FiO2 (%):  [50 %] 50 % (10/26 0215) Weight:  [105.5 kg (232 lb 9.4 oz)] 105.5 kg (232 lb 9.4 oz) (10/26 0252) HEMODYNAMICS:   VENTILATOR SETTINGS: Vent Mode:  [-]  FiO2 (%):  [50 %] 50 % INTAKE / OUTPUT:  Intake/Output Summary (Last 24 hours) at 06/25/15 0258 Last data filed at 06/25/15 0200  Gross per 24 hour  Intake      0 ml  Output    700 ml  Net   -700 ml    PHYSICAL EXAMINATION: General:  Male appear older than stated age Neuro:  Alert, oriented, non-focal. Mild tremor HEENT:  [place]/AT, no JVD. PERRL Cardiovascular:  RRR, no MRG Lungs:  Clear bilateral breath sounds Abdomen:  Soft, non-tender, non-distended Musculoskeletal:  Pain R hip, peripheral pulses intact Skin:  Grossly intact  LABS:  CBC No results for input(s): WBC, HGB, HCT, PLT in the last 168 hours. Coag's No results for input(s): APTT, INR in the last 168 hours. BMET No results for input(s): NA, K, CL, CO2, BUN, CREATININE, GLUCOSE in the last 168 hours. Electrolytes No results for input(s): CALCIUM, MG, PHOS in the last 168 hours. Sepsis Markers No results for input(s): LATICACIDVEN, PROCALCITON, O2SATVEN in the last 168 hours. ABG No results for input(s): PHART, PCO2ART, PO2ART in the last 168 hours. Liver Enzymes No results for input(s): AST, ALT, ALKPHOS, BILITOT, ALBUMIN in the last 168 hours. Cardiac Enzymes No results for input(s): TROPONINI, PROBNP in the last 168 hours. Glucose  Recent Labs Lab 06/25/15 0227  GLUCAP 127*    Imaging No results found.   ASSESSMENT / PLAN:  PULMONARY A: Acute hypoxemic respiratory failure secondary to large volume pulmonary emboli.  COPD without acute exacerbation.   P:   Supplemental O2 as needed to maintain SpO2 greater than 92% Continue home advair, spiriva  CARDIOVASCULAR A:  Atrial fibrillation s/p recent ablation (currently sinus) Syncope with history of  same, cardiogenic in past, suspect now secondary to PE. HTN H/o CAD, HLD Pacemaker, defibrillator in-situ  P:  Telemetry monitoring Interrogate pacemaker to ensure no cardiac cause of syncope Assess lactic acid, troponin Continue home amiodarone, ASA, coreg, fenofibrate, simvastatin  RENAL A:   CKD stage 2, unclear if acute component. SCr 1.3 on admission.  High AG metabolic acidosis  P:   Follow Bmet Lactic pending  GASTROINTESTINAL A:   GERD ?Cirrhosis noted on CTA chest  P:   NPO except sips for now while considering EKOS Protonix Check LFT  HEMATOLOGIC A:   Bilateral submassive pulmonary emboli > provoked in setting of recent hospitalization and sedentary.  P:  Heparin per pharmacy Trend coag's Assess BLE doppler Echocardiogram Possible EKOS candidate, will discuss with attending physician. Please see his attestation.   MUSCULOSKELETAL  A:   R hip fracture  P:   Consult Ortho in AM Fentanyl for pain control  INFECTIOUS A:   SIRS, no obvious source Leukocytosis (18 on admission)  P:   Monitor off ABX Trend WBC and fever curve.  ENDOCRINE A:   DM managed by diet  P:   Follow glucose on chemistries  NEUROLOGIC A:   Parkinson's disease Depression  P:   RASS goal: 0 Hold sedating meds Continue carbidopa/levadopa.  Hold brintellix >  dizziness side effect  FAMILY  - Updates: patient and son updated at bedside  - Inter-disciplinary family meet or Palliative Care meeting due by:  11/2   Joneen Roach, AGACNP-BC [place] [facility]/Critical [facility] Pager [phone] or [phone]  06/25/2015 3:37 AM

## 2015-06-26 ENCOUNTER — Ambulatory Visit (HOSPITAL_COMMUNITY): Payer: Medicare PPO

## 2015-06-26 ENCOUNTER — Encounter (HOSPITAL_COMMUNITY): Payer: Self-pay | Admitting: Certified Registered"

## 2015-06-26 ENCOUNTER — Inpatient Hospital Stay (HOSPITAL_COMMUNITY): Payer: Medicare PPO | Admitting: Anesthesiology

## 2015-06-26 ENCOUNTER — Encounter (HOSPITAL_COMMUNITY)
Admission: AD | Disposition: A | Discharge status: Skilled Nursing Facility | Payer: Self-pay | Source: Other Acute Inpatient Hospital | Attending: Internal Medicine

## 2015-06-26 ENCOUNTER — Inpatient Hospital Stay (HOSPITAL_COMMUNITY): Payer: Medicare PPO

## 2015-06-26 DIAGNOSIS — I2699 Other pulmonary embolism without acute cor pulmonale: Secondary | ICD-10-CM

## 2015-06-26 HISTORY — PX: HIP ARTHROPLASTY: SHX981

## 2015-06-26 LAB — PHOSPHORUS: PHOSPHORUS: 2.3 mg/dL — AB (ref 2.5–4.6)

## 2015-06-26 LAB — BETA-2-GLYCOPROTEIN I ABS, IGG/M/A: Beta-2-Glycoprotein I IgM: <9 GPI IgM units (ref 0–32)

## 2015-06-26 LAB — CARDIOLIPIN ANTIBODIES, IGG, IGM, IGA: Anticardiolipin IgM: <9 MPL U/mL (ref 0–12)

## 2015-06-26 LAB — CBC
HEMATOCRIT: 37.5 % — AB (ref 39.0–52.0)
Hemoglobin: 12.7 g/dL — ABNORMAL LOW (ref 13.0–17.0)
MCH: 32.2 pg (ref 26.0–34.0)
MCHC: 33.9 g/dL (ref 30.0–36.0)
MCV: 94.9 fL (ref 78.0–100.0)
PLATELETS: 199 10*3/uL (ref 150–400)
RBC: 3.95 MIL/uL — ABNORMAL LOW (ref 4.22–5.81)
RDW: 12.9 % (ref 11.5–15.5)
WBC: 12.2 10*3/uL — AB (ref 4.0–10.5)

## 2015-06-26 LAB — PROTIME-INR
INR: 1.24 (ref 0.00–1.49)
Prothrombin Time: 15.7 seconds — ABNORMAL HIGH (ref 11.6–15.2)

## 2015-06-26 LAB — BASIC METABOLIC PANEL
ANION GAP: 8 (ref 5–15)
BUN: 19 mg/dL (ref 6–20)
CALCIUM: 8 mg/dL — AB (ref 8.9–10.3)
CO2: 26 mmol/L (ref 22–32)
Chloride: 99 mmol/L — ABNORMAL LOW (ref 101–111)
Creatinine, Ser: 1.19 mg/dL (ref 0.61–1.24)
Glucose, Bld: 139 mg/dL — ABNORMAL HIGH (ref 65–99)
Potassium: 3.8 mmol/L (ref 3.5–5.1)
Sodium: 133 mmol/L — ABNORMAL LOW (ref 135–145)

## 2015-06-26 LAB — HEPARIN LEVEL (UNFRACTIONATED): HEPARIN UNFRACTIONATED: 0.28 [IU]/mL — AB (ref 0.30–0.70)

## 2015-06-26 LAB — HOMOCYSTEINE: HOMOCYSTEINE-NORM: 8.8 umol/L (ref 0.0–15.0)

## 2015-06-26 LAB — APTT: APTT: 29 s (ref 24–37)

## 2015-06-26 LAB — MAGNESIUM: MAGNESIUM: 2.2 mg/dL (ref 1.7–2.4)

## 2015-06-26 LAB — PROTEIN C, TOTAL: Protein C, Total: 84 % (ref 60–150)

## 2015-06-26 SURGERY — HEMIARTHROPLASTY, HIP, DIRECT ANTERIOR APPROACH, FOR FRACTURE
Anesthesia: Spinal | Site: Hip | Laterality: Right

## 2015-06-26 MED ORDER — ROCURONIUM BROMIDE 50 MG/5ML IV SOLN
INTRAVENOUS | Status: AC
  Filled 2015-06-26: qty 1

## 2015-06-26 MED ORDER — CEFAZOLIN SODIUM-DEXTROSE 2-3 GM-% IV SOLR
2.0000 g | INTRAVENOUS | Status: AC
  Administered 2015-06-26: 2 g via INTRAVENOUS
  Filled 2015-06-26 (×2): qty 50

## 2015-06-26 MED ORDER — MENTHOL 3 MG MT LOZG: 1.0000 | LOZENGE | OROMUCOSAL | Status: DC | PRN

## 2015-06-26 MED ORDER — HEPARIN BOLUS VIA INFUSION
2000.0000 [IU] | Freq: Once | INTRAVENOUS | Status: AC
Start: 2015-06-26 — End: 2015-06-26
  Administered 2015-06-26: 2000 [IU] via INTRAVENOUS
  Filled 2015-06-26: qty 2000

## 2015-06-26 MED ORDER — POLYETHYLENE GLYCOL 3350 17 G PO PACK: 17.0000 g | PACK | Freq: Every day | ORAL | Status: DC | PRN

## 2015-06-26 MED ORDER — CHLORHEXIDINE GLUCONATE 4 % EX LIQD: 60.0000 mL | Freq: Once | CUTANEOUS | Status: DC

## 2015-06-26 MED ORDER — PHENYLEPHRINE HCL 10 MG/ML IJ SOLN
10.0000 mg | INTRAVENOUS | Status: DC | PRN
  Administered 2015-06-26: 25 ug/min via INTRAVENOUS

## 2015-06-26 MED ORDER — PHENOL 1.4 % MT LIQD: 1.0000 | OROMUCOSAL | Status: DC | PRN

## 2015-06-26 MED ORDER — FENTANYL CITRATE (PF) 250 MCG/5ML IJ SOLN
INTRAMUSCULAR | Status: DC | PRN
  Administered 2015-06-26 (×2): 50 ug via INTRAVENOUS

## 2015-06-26 MED ORDER — CEFAZOLIN SODIUM-DEXTROSE 2-3 GM-% IV SOLR
2.0000 g | Freq: Four times a day (QID) | INTRAVENOUS | Status: AC
  Administered 2015-06-26 – 2015-06-27 (×2): 2 g via INTRAVENOUS
  Filled 2015-06-26 (×2): qty 50

## 2015-06-26 MED ORDER — ACETAMINOPHEN 325 MG PO TABS: 325.0000 mg | ORAL_TABLET | ORAL | Status: DC | PRN

## 2015-06-26 MED ORDER — MIDAZOLAM HCL 2 MG/2ML IJ SOLN
INTRAMUSCULAR | Status: AC
  Filled 2015-06-26: qty 4

## 2015-06-26 MED ORDER — ACETAMINOPHEN 160 MG/5ML PO SOLN
325.0000 mg | ORAL | Status: DC | PRN
  Filled 2015-06-26: qty 20.3

## 2015-06-26 MED ORDER — ONDANSETRON HCL 4 MG/2ML IJ SOLN
INTRAMUSCULAR | Status: AC
  Filled 2015-06-26: qty 2

## 2015-06-26 MED ORDER — DEXAMETHASONE SODIUM PHOSPHATE 4 MG/ML IJ SOLN
INTRAMUSCULAR | Status: AC
  Filled 2015-06-26: qty 2

## 2015-06-26 MED ORDER — OXYCODONE HCL 5 MG/5ML PO SOLN: 5.0000 mg | Freq: Once | ORAL | Status: DC | PRN

## 2015-06-26 MED ORDER — LIDOCAINE HCL (CARDIAC) 20 MG/ML IV SOLN
INTRAVENOUS | Status: AC
  Filled 2015-06-26: qty 5

## 2015-06-26 MED ORDER — PROPOFOL 10 MG/ML IV BOLUS
INTRAVENOUS | Status: AC
  Filled 2015-06-26: qty 20

## 2015-06-26 MED ORDER — METHOCARBAMOL 500 MG PO TABS: 500.0000 mg | ORAL_TABLET | Freq: Four times a day (QID) | ORAL | Status: AC

## 2015-06-26 MED ORDER — METOCLOPRAMIDE HCL 10 MG PO TABS: 5.0000 mg | ORAL_TABLET | Freq: Three times a day (TID) | ORAL | Status: DC | PRN

## 2015-06-26 MED ORDER — MIDAZOLAM HCL 5 MG/5ML IJ SOLN
INTRAMUSCULAR | Status: DC | PRN
  Administered 2015-06-26 (×2): 1 mg via INTRAVENOUS

## 2015-06-26 MED ORDER — LACTATED RINGERS IV SOLN
INTRAVENOUS | Status: DC
  Administered 2015-06-26 (×2): via INTRAVENOUS

## 2015-06-26 MED ORDER — METOCLOPRAMIDE HCL 5 MG/ML IJ SOLN: 5.0000 mg | Freq: Three times a day (TID) | INTRAMUSCULAR | Status: DC | PRN

## 2015-06-26 MED ORDER — PROPOFOL 500 MG/50ML IV EMUL
INTRAVENOUS | Status: DC | PRN
  Administered 2015-06-26: 20 ug/kg/min via INTRAVENOUS

## 2015-06-26 MED ORDER — OXYCODONE HCL 5 MG PO TABS: 5.0000 mg | ORAL_TABLET | Freq: Once | ORAL | Status: DC | PRN

## 2015-06-26 MED ORDER — FENTANYL CITRATE (PF) 100 MCG/2ML IJ SOLN: 25.0000 ug | INTRAMUSCULAR | Status: DC | PRN

## 2015-06-26 MED ORDER — EPHEDRINE SULFATE 50 MG/ML IJ SOLN
INTRAMUSCULAR | Status: DC | PRN
  Administered 2015-06-26 (×2): 10 mg via INTRAVENOUS

## 2015-06-26 MED ORDER — 0.9 % SODIUM CHLORIDE (POUR BTL) OPTIME
TOPICAL | Status: DC | PRN
  Administered 2015-06-26: 1000 mL

## 2015-06-26 MED ORDER — HEPARIN (PORCINE) IN NACL 100-0.45 UNIT/ML-% IJ SOLN
2800.0000 [IU]/h | INTRAMUSCULAR | Status: AC
  Administered 2015-06-27: 2200 [IU]/h via INTRAVENOUS
  Administered 2015-06-27 – 2015-06-28 (×2): 2400 [IU]/h via INTRAVENOUS
  Administered 2015-06-28: 2800 [IU]/h via INTRAVENOUS
  Administered 2015-06-28: 2400 [IU]/h via INTRAVENOUS
  Administered 2015-06-29 – 2015-06-30 (×3): 2800 [IU]/h via INTRAVENOUS
  Filled 2015-06-26 (×10): qty 250

## 2015-06-26 MED ORDER — BUPIVACAINE IN DEXTROSE 0.75-8.25 % IT SOLN
INTRATHECAL | Status: DC | PRN
  Administered 2015-06-26: 1.6 mL via INTRATHECAL

## 2015-06-26 MED ORDER — FENTANYL CITRATE (PF) 250 MCG/5ML IJ SOLN
INTRAMUSCULAR | Status: AC
  Filled 2015-06-26: qty 5

## 2015-06-26 MED ORDER — HYDROCODONE-ACETAMINOPHEN 5-325 MG PO TABS: 1.0000 | ORAL_TABLET | Freq: Four times a day (QID) | ORAL | Status: AC | PRN

## 2015-06-26 MED ORDER — ACETAMINOPHEN 500 MG PO TABS
1000.0000 mg | ORAL_TABLET | Freq: Once | ORAL | Status: AC
  Administered 2015-06-26: 1000 mg via ORAL
  Filled 2015-06-26: qty 2

## 2015-06-26 MED ORDER — HYDROCODONE-ACETAMINOPHEN 5-325 MG PO TABS
1.0000 | ORAL_TABLET | Freq: Four times a day (QID) | ORAL | Status: DC | PRN
  Administered 2015-06-26: 2 via ORAL
  Filled 2015-06-26: qty 2

## 2015-06-26 SURGICAL SUPPLY — 53 items
BIT DRILL 7/64X5 DISP (BIT) ×3 IMPLANT
BLADE SAGITTAL 25.0X1.27X90 (BLADE) ×2 IMPLANT
BLADE SAGITTAL 25.0X1.27X90MM (BLADE) ×1
CAPT HIP HEMI 2 ×3 IMPLANT
CLOSURE STERI-STRIP 1/2X4 (GAUZE/BANDAGES/DRESSINGS)
CLOSURE WOUND 1/2 X4 (GAUZE/BANDAGES/DRESSINGS) ×1
CLSR STERI-STRIP ANTIMIC 1/2X4 (GAUZE/BANDAGES/DRESSINGS) IMPLANT
COVER SURGICAL LIGHT HANDLE (MISCELLANEOUS) ×3 IMPLANT
DRAPE IMP U-DRAPE 54X76 (DRAPES) ×3 IMPLANT
DRAPE ORTHO SPLIT 77X108 STRL (DRAPES) ×4
DRAPE SURG ORHT 6 SPLT 77X108 (DRAPES) ×2 IMPLANT
DRAPE U-SHAPE 47X51 STRL (DRAPES) ×3 IMPLANT
DRSG MEPILEX BORDER 4X8 (GAUZE/BANDAGES/DRESSINGS) ×3 IMPLANT
DURAPREP 26ML APPLICATOR (WOUND CARE) ×3 IMPLANT
ELECT BLADE 4.0 EZ CLEAN MEGAD (MISCELLANEOUS) ×3
ELECT CAUTERY BLADE 6.4 (BLADE) ×3 IMPLANT
ELECT REM PT RETURN 9FT ADLT (ELECTROSURGICAL) ×3
ELECTRODE BLDE 4.0 EZ CLN MEGD (MISCELLANEOUS) ×1 IMPLANT
ELECTRODE REM PT RTRN 9FT ADLT (ELECTROSURGICAL) ×1 IMPLANT
FACESHIELD WRAPAROUND (MASK) ×3 IMPLANT
GLOVE BIO SURGEON STRL SZ7 (GLOVE) ×3 IMPLANT
GLOVE BIO SURGEON STRL SZ7.5 (GLOVE) ×3 IMPLANT
GLOVE BIOGEL PI IND STRL 7.0 (GLOVE) ×1 IMPLANT
GLOVE BIOGEL PI IND STRL 8 (GLOVE) ×1 IMPLANT
GLOVE BIOGEL PI INDICATOR 7.0 (GLOVE) ×2
GLOVE BIOGEL PI INDICATOR 8 (GLOVE) ×2
GOWN STRL REUS W/ TWL LRG LVL3 (GOWN DISPOSABLE) ×1 IMPLANT
GOWN STRL REUS W/ TWL XL LVL3 (GOWN DISPOSABLE) ×2 IMPLANT
GOWN STRL REUS W/TWL LRG LVL3 (GOWN DISPOSABLE) ×2
GOWN STRL REUS W/TWL XL LVL3 (GOWN DISPOSABLE) ×4
HIP CAPITATED HEMI 2 ×1 IMPLANT
KIT BASIN OR (CUSTOM PROCEDURE TRAY) ×3 IMPLANT
KIT ROOM TURNOVER OR (KITS) ×3 IMPLANT
MANIFOLD NEPTUNE II (INSTRUMENTS) ×3 IMPLANT
NS IRRIG 1000ML POUR BTL (IV SOLUTION) ×3 IMPLANT
PACK TOTAL JOINT (CUSTOM PROCEDURE TRAY) ×3 IMPLANT
PACK UNIVERSAL I (CUSTOM PROCEDURE TRAY) ×3 IMPLANT
PAD ARMBOARD 7.5X6 YLW CONV (MISCELLANEOUS) ×6 IMPLANT
PILLOW ABDUCTION HIP (SOFTGOODS) IMPLANT
RETRIEVER SUT HEWSON (MISCELLANEOUS) ×3 IMPLANT
STRIP CLOSURE SKIN 1/2X4 (GAUZE/BANDAGES/DRESSINGS) ×2 IMPLANT
SUT FIBERWIRE #2 38 REV NDL BL (SUTURE) ×6
SUT MNCRL AB 4-0 PS2 18 (SUTURE) IMPLANT
SUT MON AB 2-0 CT1 36 (SUTURE) ×3 IMPLANT
SUT VIC AB 0 CT1 27 (SUTURE) ×2
SUT VIC AB 0 CT1 27XBRD ANBCTR (SUTURE) ×1 IMPLANT
SUT VIC AB 1 CT1 27 (SUTURE) ×4
SUT VIC AB 1 CT1 27XBRD ANBCTR (SUTURE) ×2 IMPLANT
SUT VIC AB 2-0 CT1 27 (SUTURE) ×4
SUT VIC AB 2-0 CT1 TAPERPNT 27 (SUTURE) ×2 IMPLANT
SUTURE FIBERWR#2 38 REV NDL BL (SUTURE) ×2 IMPLANT
TOWEL OR 17X24 6PK STRL BLUE (TOWEL DISPOSABLE) ×3 IMPLANT
TOWEL OR 17X26 10 PK STRL BLUE (TOWEL DISPOSABLE) ×3 IMPLANT

## 2015-06-26 NOTE — Progress Notes (Signed)
PT Cancellation Note and D/C note  Patient Details Name: Jonathan Stafford MRN: [id number] DOB: [birth year]   Cancelled Treatment:    Reason Eval/Treat Not Completed: Patient at procedure or test/unavailable (Pt going for hip arthroplasty.  Will need reorders.  Thanks.)Signing off.  Please reorder after surgery.     [name], Trejuan Matherne F 06/26/2015, 11:57 AM [name],PT Acute Rehabilitation [phone] (pager)

## 2015-06-26 NOTE — Anesthesia Procedure Notes (Signed)
Spinal Patient location during procedure: OR Staffing Anesthesiologist: Chellsea Beckers Preanesthetic Checklist Completed: patient identified, surgical consent, pre-op evaluation, timeout performed, IV checked, risks and benefits discussed and monitors and equipment checked Spinal Block Patient position: right lateral decubitus Prep: site prepped and draped and DuraPrep Patient monitoring: heart rate, cardiac monitor, continuous pulse ox and blood pressure Approach: midline Location: L3-4 Injection technique: single-shot Needle Needle type: Pencan  Needle gauge: 24 G Needle length: 10 cm Assessment Sensory level: T6   

## 2015-06-26 NOTE — Progress Notes (Signed)
ANTICOAGULATION CONSULT NOTE - Follow Up Consult  Pharmacy Consult for Heparin  Indication: pulmonary embolus  No Known Allergies  Patient Measurements: Height: 5\' 11"  (180.3 cm) Weight: 234 lb (106.142 kg) IBW/kg (Calculated) : 75.3  Vital Signs: Temp: 98.8 F (37.1 C) (10/27 0004) Temp Source: Oral (10/27 0004) BP: 96/52 mmHg (10/27 0004) Pulse Rate: 86 (10/27 0004)  Labs:  Recent Labs  06/25/15 0341 06/25/15 0805 06/25/15 1010 06/25/15 1410 06/25/15 2056 06/26/15 0225  HGB  --  13.6  --   --   --  12.7*  HCT  --  41.2  --   --   --  37.5*  PLT  --  201  --   --   --  199  HEPARINUNFRC 0.17*  --  0.51  --  0.37 0.28*  CREATININE 1.26* 1.28*  --   --   --   --   TROPONINI 0.06* 0.06*  --  0.04*  --   --     Estimated Creatinine Clearance: 71.3 mL/min (by C-G formula based on Cr of 1.28).  Assessment: HL is sub-therapeutic this AM, no issues per RN.   Goal of Therapy:  Heparin level 0.3-0.7 units/ml Monitor platelets by anticoagulation protocol: Yes   Plan:  -Heparin 2000 units BOLUS -Increase heparin to 2200 units/hr -1100 HL -Daily CBC/HL -Monitor for bleeding  Abran DukeLedford, Ron Beske 06/26/2015,3:21 AM

## 2015-06-26 NOTE — Progress Notes (Signed)
*  PRELIMINARY RESULTS* Vascular Ultrasound Lower extremity venous duplex has been completed.  Preliminary findings: Subacute DVT noted in the left common femoral vein. Chronic vs indeterminate age DVT noted in the left popliteal vein. No DVT RLE.  Farrel DemarkJill Eunice, RDMS, RVT  06/26/2015, 2:10 PM

## 2015-06-26 NOTE — Progress Notes (Signed)
[place] TEAM 1 - Stepdown/ICU TEAM Progress Note  Jonathan Stafford ZOX:[id number] DOB: [birth year] DOA: 06/25/2015 PCP: No primary care provider on file.  Admit HPI / Brief Narrative: 64 year old male with PMHx  Depression, SVT and Atrial fibrillation with indwelling pacemaker/defibrillator, CAD, COPD, CKD stage 2, and parkinson's disease.   Recently admitted to [facility] regional 05/2015 for ablation. He was there for 3 days, and almost immediately after being discharged he reports "not feeling right". Since that time his has had significant functional decline. For the past week or two SOB has developed, which greatly reduced his functional status. He has been unable to even leave the house, and more recently (last couple of days) he had been unable to ambulate 10 feet without becoming markedly short of breath. He denies associated chest pain, fevers, chills, or cough. 10/25 he suffered a syncopal episode. He denies prodrome, and states that he just fell out out of nowhere. He did lose consciousness. He does not recall hitting his head during fall. He presented to [facility] ED after syncope. CTA of the chest was performed and found Large volume emboli throughout bilateral lungs. RV/LV ratio 1.09. He was hypoxemic requiring venti mask to keep sats in 90s. Also due to fall he suffered R hip fracture.   HPI/Subjective: 10/27 attempted to see patient twice today patient remains in the OR.  Assessment/Plan:  Acute hypoxemic respiratory failure secondary to large volume pulmonary emboli.    COPD without acute exacerbation.   Bilateral submassive pulmonary emboli or DVT -provoked in setting of recent hospitalization and sedentary. -Heparin per pharmacy and surgery -Echocardiogram; RV dysfunction moderate to severe see results below  Atrial fibrillation s/p recent ablation (currently sinus)   Syncope with history of same, cardiogenic in past, suspect now secondary to PE.   HTN  H/o CAD,  Pacemaker, defibrillator in-situ   HLD   CKD stage 2, unclear if acute component. SCr 1.3 on admission.    ?Cirrhosis noted on CTA chest  Rt hip fracture -Currently in OR right hip   SIRS, no obvious source Leukocytosis (18 on admission)  DM managed by diet -Hemoglobin A1c pending -Lipid panel pending  NEUROLOGIC A:  Parkinson's disease Depression   Code Status: FULL Family Communication: no family present at time of exam Disposition Plan: Per surgery    Consultants: Dr.Timothy D Murphy (orthopedic surgery)   Procedure/Significant Events: 10/26 echocardiogram;- LVEF= 50% to 55%. - (grade 1 diastolic dysfunction). - Ventricular septum: diastolic flattening. - Right ventricle: Systolic function was moderately toseverely reduced.    Culture   Antibiotics: Ancef 10/27 1 dose  DVT prophylaxis: SCD's and heparin post op   Devices    LINES / TUBES:      Continuous Infusions: . heparin 2,200 Units/hr (06/26/15 0333)    Objective: VITAL SIGNS: Temp: 98.2 F (36.8 C) (10/27 0736) Temp Source: Oral (10/27 0736) BP: 116/66 mmHg (10/27 0736) Pulse Rate: 85 (10/27 0736) SPO2; FIO2:   Intake/Output Summary (Last 24 hours) at 06/26/15 0757 Last data filed at 06/26/15 0700  Gross per 24 hour  Intake 1214.5 ml  Output   1125 ml  Net   89.5 ml     Exam: General: In OR    Data Reviewed: Basic Metabolic Panel:  Recent Labs Lab 06/25/15 0341 06/25/15 0805 06/26/15 0225  NA 137 136 133*  K 4.1 4.3 3.8  CL 103 101 99*  CO2 GLUCOSE 140* 132* 139*  BUN 22* 21* 19  CREATININE 1.26* 1.28* 1.19  CALCIUM 8.5* 8.4* 8.0*  MG 1.9  --  2.2  PHOS 3.1  --  2.3*   Liver Function Tests:  Recent Labs Lab 06/25/15 0341  AST 22  ALT 22  ALKPHOS 32*  BILITOT 0.9  PROT 6.2*  ALBUMIN 3.3*   No results for input(s): LIPASE, AMYLASE in the last 168 hours. No results for input(s): AMMONIA in the last 168 hours. CBC:  Recent  Labs Lab 06/25/15 0805 06/26/15 0225  WBC 11.8* 12.2*  HGB 13.6 12.7*  HCT 41.2 37.5*  MCV 95.8 94.9  PLT 201 199   Cardiac Enzymes:  Recent Labs Lab 06/25/15 0341 06/25/15 0805 06/25/15 1410  TROPONINI 0.06* 0.06* 0.04*   BNP (last 3 results)  Recent Labs  06/25/15 0341  BNP 200.8*    ProBNP (last 3 results) No results for input(s): PROBNP in the last 8760 hours.  CBG:  Recent Labs Lab 06/25/15 0227 06/25/15 0740  GLUCAP 127* 127*    Recent Results (from the past 240 hour(s))  MRSA PCR Screening     Status: None   Collection Time: 06/25/15  2:11 AM  Result Value Ref Range Status   MRSA by PCR NEGATIVE NEGATIVE Final    Comment:        The GeneXpert MRSA Assay (FDA approved for NASAL specimens only), is one component of a comprehensive MRSA colonization surveillance program. It is not intended to diagnose MRSA infection nor to guide or monitor treatment for MRSA infections.      Studies:  Recent x-ray studies have been reviewed in detail by the Attending Physician  Scheduled Meds:  Scheduled Meds: . amiodarone  200 mg Oral Daily  . antiseptic oral rinse  7 mL Mouth Rinse BID  . aspirin  325 mg Oral Daily  . carbidopa-levodopa  2 tablet Oral BID  . carvedilol  6.25 mg Oral BID WC  . fenofibrate  160 mg Oral Daily  . mometasone-formoterol  2 puff Inhalation BID  . omega-3 acid ethyl esters  1 g Oral TID  . pantoprazole  40 mg Oral Daily  . simvastatin  20 mg Oral Daily  . spironolactone  25 mg Oral Daily  . tiotropium  18 mcg Inhalation Daily    Time spent on care of this patient: 40 mins   WOODS, Roselind MessierURTIS J , MD  [facility] Office  [phone] Pager [phone]- [phone]  On-Call/Text Page:      [email or web address]      password [email or web address]  If 7PM-7AM, please contact night-coverage [email or web address] Password [email or web address] 06/26/2015, 7:57 AM   LOS: 1 day   Care during the described time interval was provided by me .  I have reviewed this patient's  available data, including medical history, events of note, physical examination, and all test results as part of my evaluation. I have personally reviewed and interpreted all radiology studies.   Carolyne Littlesurtis Woods, MD [phone] Pager

## 2015-06-26 NOTE — Op Note (Signed)
06/25/2015 - 06/26/2015  7:05 PM  PATIENT:  Jonathan Stafford   MRN: [id number]  PRE-OPERATIVE DIAGNOSIS:  R HIP FRACTURE  POST-OPERATIVE DIAGNOSIS:  R HIP FRACTURE  PROCEDURE:  Procedure(s): ARTHROPLASTY BIPOLAR HIP (HEMIARTHROPLASTY)  PREOPERATIVE INDICATIONS:  Jonathan Stafford is an 64 y.o. male who was admitted 06/25/2015 with a diagnosis of <principal problem not specified> and elected for surgical management.  The risks benefits and alternatives were discussed with the patient including but not limited to the risks of nonoperative treatment, versus surgical intervention including infection, bleeding, nerve injury, periprosthetic fracture, the need for revision surgery, dislocation, leg length discrepancy, blood clots, cardiopulmonary complications, morbidity, mortality, among others, and they were willing to proceed.  Predicted outcome is good, although there will be at least a six to nine month expected recovery.   OPERATIVE REPORT     SURGEON:  Edmonia Lynch, MD    ASSISTANT:  Lovett Calender, PA-C, She was present and scrubbed throughout the case, critical for completion in a timely fashion, and for retraction, instrumentation, and closure.     ANESTHESIA:  General    COMPLICATIONS:  None.      COMPONENTS:  [employer] Acolade: Femoral stem: 5, Femoral Head:56, Neck:0   PROCEDURE IN DETAIL: The patient was met in the holding area and identified.  The appropriate hip  was marked at the operative site. The patient was then transported to the OR and  placed under general anesthesia.  At that point, the patient was  placed in the lateral decubitus position with the operative side up and  secured to the operating room table and all bony prominences padded.     The operative lower extremity was prepped from the iliac crest to the toes.  Sterile draping was performed.  Time out was performed prior to incision.      A routine posterolateral approach was utilized via sharp dissection   carried down to the subcutaneous tissue.  Gross bleeders were Bovie  coagulated.  The iliotibial band was identified and incised  along the length of the skin incision.  Self-retaining retractors were  inserted.  With the hip internally rotated, the short external rotators  were identified. The piriformis was tagged with FiberWire, and the hip capsule released in a T-type fashion.  The femoral neck was exposed, and I resected the femoral neck using the appropriate jig. This was performed at approximately a thumb's breadth above the lesser trochanter.    I then exposed the deep acetabulum, cleared out any tissue including the ligamentum teres, and included the hip capsule in the FiberWire used above and below the T.    I then prepared the proximal femur using the cookie-cutter, the lateralizing reamer, and then sequentially broached.  A trial utilized, and I reduced the hip and it was found to have excellent stability with functional range of motion. The trial components were then removed.   The canal and acetabulum were thoroughly irrigated  I inserted the pressfit stem and placed the head and neck collar. The hip was reduced with appropriate force and was stable through a range of motion.   I then used a 2 mm drill bits to pass the FiberWire suture from the capsule and puriform is through the greater trochanter, and secured this. Excellent posterior capsular repair was achieved. I also closed the T in the capsule.  I then irrigated the hip copiously again with pulse lavage, and repaired the fascia with Vicryl, followed by Vicryl for the subcutaneous tissue, Monocryl  for the skin, Steri-Strips and sterile gauze. The wounds were injected. The patient was then awakened and returned to PACU in stable and satisfactory condition. There were no complications.  POST-OP PLAN: Weight bearing as tolerated. DVT px will consist of SCD's and Heparin drip  Edmonia Lynch, MD Orthopedic Surgeon [phone]    06/26/2015 7:05 PM   This note was generated using a template and dragon dictation system. In light of that, I have reviewed the note and all aspects of it are applicable to this case. Any dictation errors are due to the computerized dictation system.

## 2015-06-26 NOTE — Transfer of Care (Signed)
Immediate Anesthesia Transfer of Care Note  Patient: Jonathan Stafford  Procedure(s) Performed: Procedure(s): ARTHROPLASTY BIPOLAR HIP (HEMIARTHROPLASTY) (Right)  Patient Location: PACU  Anesthesia Type:Spinal  Level of Consciousness: awake, oriented and patient cooperative  Airway & Oxygen Therapy: Patient Spontanous Breathing and Patient connected to nasal cannula oxygen  Post-op Assessment: Report given to RN, Post -op Vital signs reviewed and stable and Spinal  Moving uppper ext  Post vital signs: Reviewed and stable  Last Vitals:  Filed Vitals:   06/26/15 1934  BP:   Pulse:   Temp: 36.8 C  Resp:     Complications: No apparent anesthesia complications

## 2015-06-26 NOTE — Anesthesia Preprocedure Evaluation (Addendum)
Anesthesia Evaluation  Patient identified by MRN, date of birth, ID band Patient awake    Reviewed: Allergy & Precautions, NPO status , Patient's Chart, lab work & pertinent test results  History of Anesthesia Complications Negative for: history of anesthetic complications  Airway Mallampati: II  TM Distance: >3 FB Neck ROM: Full    Dental  (+) Teeth Intact   Pulmonary COPD,  COPD inhaler, Current Smoker, PE   breath sounds clear to auscultation       Cardiovascular hypertension, + CAD  (-) Past MI and (-) CHF + dysrhythmias Atrial Fibrillation + pacemaker + Cardiac Defibrillator  Rhythm:Regular     Neuro/Psych PSYCHIATRIC DISORDERS Depression parkinsons CVA, No Residual Symptoms    GI/Hepatic GERD  Medicated and Controlled,(+) Cirrhosis       ,   Endo/Other  negative endocrine ROS  Renal/GU CRFRenal disease     Musculoskeletal  (+) Arthritis ,   Abdominal   Peds  Hematology negative hematology ROS (+)   Anesthesia Other Findings   Reproductive/Obstetrics                            Anesthesia Physical Anesthesia Plan  ASA: III  Anesthesia Plan: Spinal   Post-op Pain Management:    Induction: Intravenous  Airway Management Planned: Nasal Cannula  Additional Equipment: None  Intra-op Plan:   Post-operative Plan:   Informed Consent: I have reviewed the patients History and Physical, chart, labs and discussed the procedure including the risks, benefits and alternatives for the proposed anesthesia with the patient or authorized representative who has indicated his/her understanding and acceptance.   Dental advisory given  Plan Discussed with: CRNA and Surgeon  Anesthesia Plan Comments:        Anesthesia Quick Evaluation

## 2015-06-26 NOTE — Progress Notes (Signed)
ANTICOAGULATION CONSULT NOTE - Follow Up Consult  Pharmacy Consult for Heparin  Indication: pulmonary embolus  No Known Allergies  Patient Measurements: Height: 5\' 11"  (180.3 cm) Weight: 234 lb (106.142 kg) IBW/kg (Calculated) : 75.3  Vital Signs: Temp: 96.8 F (36 C) (10/27 2110) Temp Source: Oral (10/27 1222) BP: 109/66 mmHg (10/27 2051) Pulse Rate: 85 (10/27 2110)  Labs:  Recent Labs  06/25/15 0341 06/25/15 0805 06/25/15 1010 06/25/15 1410 06/25/15 2056 06/26/15 0225 06/26/15 1715  HGB  --  13.6  --   --   --  12.7*  --   HCT  --  41.2  --   --   --  37.5*  --   PLT  --  201  --   --   --  199  --   APTT  --   --   --   --   --   --  29  LABPROT  --   --   --   --   --   --  15.7*  INR  --   --   --   --   --   --  1.24  HEPARINUNFRC 0.17*  --  0.51  --  0.37 0.28*  --   CREATININE 1.26* 1.28*  --   --   --  1.19  --   TROPONINI 0.06* 0.06*  --  0.04*  --   --   --     Assessment: [age] yoM with submassive PE now s/p R hip sx on 10/27  Heparin dosing:  10/26: HL - 0351 on 2000/ hr continue same  10/26: HL - 0.37 on 2000/hr - rate increased to 2050/hr  10/27: HL - 0.28 on 2050/hr - bolus 2000 units and rate increased to 2200 units/hr  10/27: restart heparin at 2200 units/hr   Goal of Therapy:  Heparin level 0.3-0.7 units/ml Monitor platelets by anticoagulation protocol: Yes   Plan:  -resume heparin at 2200 units/hr -HL in 6 hours  -Daily CBC/HL -Monitor for bleeding  Pollyann SamplesAndy Jacquel Redditt, PharmD, BCPS 06/26/2015, 9:29 PM Pager: [phone]

## 2015-06-26 NOTE — Progress Notes (Signed)
Patient is to have heparin restarted post-op.  Pharmacy will continue to manage.

## 2015-06-26 NOTE — Anesthesia Postprocedure Evaluation (Signed)
  Anesthesia Post-op Note  Patient: Jonathan Stafford  Procedure(s) Performed: Procedure(s) (LRB): ARTHROPLASTY BIPOLAR HIP (HEMIARTHROPLASTY) (Right)  Patient Location: PACU  Anesthesia Type: Spinal  Level of Consciousness: awake and alert   Airway and Oxygen Therapy: Patient Spontanous Breathing  Post-op Pain: mild  Post-op Assessment: Post-op Vital signs reviewed, Patient's Cardiovascular Status Stable, Respiratory Function Stable, Patent Airway and No signs of Nausea or vomiting  Last Vitals:  Filed Vitals:   06/26/15 2006  BP: 107/62  Pulse: 81  Temp:   Resp: 15    Post-op Vital Signs: stable   Complications: No apparent anesthesia complications

## 2015-06-27 ENCOUNTER — Encounter (HOSPITAL_COMMUNITY): Payer: Self-pay | Admitting: Orthopedic Surgery

## 2015-06-27 DIAGNOSIS — S72141D Displaced intertrochanteric fracture of right femur, subsequent encounter for closed fracture with routine healing: Secondary | ICD-10-CM

## 2015-06-27 DIAGNOSIS — I482 Chronic atrial fibrillation: Secondary | ICD-10-CM

## 2015-06-27 DIAGNOSIS — G20 Parkinson's disease: Secondary | ICD-10-CM

## 2015-06-27 DIAGNOSIS — G8918 Other acute postprocedural pain: Secondary | ICD-10-CM

## 2015-06-27 DIAGNOSIS — I2699 Other pulmonary embolism without acute cor pulmonale: Secondary | ICD-10-CM

## 2015-06-27 DIAGNOSIS — R269 Unspecified abnormalities of gait and mobility: Secondary | ICD-10-CM

## 2015-06-27 DIAGNOSIS — I25810 Atherosclerosis of coronary artery bypass graft(s) without angina pectoris: Secondary | ICD-10-CM

## 2015-06-27 DIAGNOSIS — I471 Supraventricular tachycardia: Secondary | ICD-10-CM

## 2015-06-27 DIAGNOSIS — D62 Acute posthemorrhagic anemia: Secondary | ICD-10-CM

## 2015-06-27 DIAGNOSIS — I4891 Unspecified atrial fibrillation: Secondary | ICD-10-CM

## 2015-06-27 DIAGNOSIS — Z9660 Presence of unspecified orthopedic joint implant: Secondary | ICD-10-CM

## 2015-06-27 LAB — CBC WITH DIFFERENTIAL/PLATELET
Basophils Absolute: 0 10*3/uL (ref 0.0–0.1)
Basophils Relative: 0 %
Eosinophils Absolute: 0.4 10*3/uL (ref 0.0–0.7)
Eosinophils Relative: 5 %
HCT: 33.3 % — ABNORMAL LOW (ref 39.0–52.0)
Hemoglobin: 11.1 g/dL — ABNORMAL LOW (ref 13.0–17.0)
Lymphocytes Relative: 25 %
Lymphs Abs: 2.4 10*3/uL (ref 0.7–4.0)
MCH: 31.1 pg (ref 26.0–34.0)
MCHC: 33.3 g/dL (ref 30.0–36.0)
MCV: 93.3 fL (ref 78.0–100.0)
Monocytes Absolute: 1.4 10*3/uL — ABNORMAL HIGH (ref 0.1–1.0)
Monocytes Relative: 14 %
Neutro Abs: 5.5 10*3/uL (ref 1.7–7.7)
Neutrophils Relative %: 56 %
Platelets: 199 10*3/uL (ref 150–400)
RBC: 3.57 MIL/uL — ABNORMAL LOW (ref 4.22–5.81)
RDW: 12.7 % (ref 11.5–15.5)
WBC: 9.8 10*3/uL (ref 4.0–10.5)

## 2015-06-27 LAB — COMPREHENSIVE METABOLIC PANEL
ALBUMIN: 2.7 g/dL — AB (ref 3.5–5.0)
ALT: 13 U/L — ABNORMAL LOW (ref 17–63)
AST: 21 U/L (ref 15–41)
Alkaline Phosphatase: 29 U/L — ABNORMAL LOW (ref 38–126)
Anion gap: 12 (ref 5–15)
BUN: 15 mg/dL (ref 6–20)
CHLORIDE: 100 mmol/L — AB (ref 101–111)
CO2: 23 mmol/L (ref 22–32)
Calcium: 8.1 mg/dL — ABNORMAL LOW (ref 8.9–10.3)
Creatinine, Ser: 1.05 mg/dL (ref 0.61–1.24)
GFR calc Af Amer: >60 mL/min (ref >60)
Glucose, Bld: 122 mg/dL — ABNORMAL HIGH (ref 65–99)
POTASSIUM: 3.6 mmol/L (ref 3.5–5.1)
Sodium: 135 mmol/L (ref 135–145)
Total Bilirubin: 0.6 mg/dL (ref 0.3–1.2)
Total Protein: 5.7 g/dL — ABNORMAL LOW (ref 6.5–8.1)

## 2015-06-27 LAB — LIPID PANEL
Cholesterol: 125 mg/dL (ref 0–200)
HDL: 32 mg/dL — ABNORMAL LOW (ref >40)
LDL Cholesterol: 62 mg/dL (ref 0–99)
Total CHOL/HDL Ratio: 3.9 RATIO
Triglycerides: 153 mg/dL — ABNORMAL HIGH (ref <150)
VLDL: 31 mg/dL (ref 0–40)

## 2015-06-27 LAB — MAGNESIUM: Magnesium: 2 mg/dL (ref 1.7–2.4)

## 2015-06-27 LAB — PROTEIN S ACTIVITY: Protein S Activity: 116 % (ref 63–140)

## 2015-06-27 LAB — PROTEIN S, TOTAL: PROTEIN S AG TOTAL: 141 % (ref 60–150)

## 2015-06-27 LAB — HEPARIN LEVEL (UNFRACTIONATED)
HEPARIN UNFRACTIONATED: 0.22 [IU]/mL — AB (ref 0.30–0.70)
HEPARIN UNFRACTIONATED: 0.43 [IU]/mL (ref 0.30–0.70)

## 2015-06-27 LAB — PROTEIN C ACTIVITY: PROTEIN C ACTIVITY: 114 % (ref 73–180)

## 2015-06-27 MED ORDER — CYCLOBENZAPRINE HCL 10 MG PO TABS
10.0000 mg | ORAL_TABLET | Freq: Once | ORAL | Status: AC
  Administered 2015-06-27: 10 mg via ORAL
  Filled 2015-06-27: qty 1

## 2015-06-27 MED ORDER — MORPHINE SULFATE (PF) 4 MG/ML IV SOLN
4.0000 mg | Freq: Once | INTRAVENOUS | Status: AC
  Administered 2015-06-27: 4 mg via INTRAVENOUS
  Filled 2015-06-27: qty 1

## 2015-06-27 MED ORDER — HYDROCODONE-ACETAMINOPHEN 5-325 MG PO TABS
1.0000 | ORAL_TABLET | ORAL | Status: DC | PRN
  Administered 2015-06-27: 2 via ORAL
  Administered 2015-06-28 – 2015-06-30 (×4): 1 via ORAL
  Filled 2015-06-27 (×2): qty 1
  Filled 2015-06-27: qty 2
  Filled 2015-06-27 (×2): qty 1

## 2015-06-27 MED ORDER — BETHANECHOL CHLORIDE 10 MG PO TABS
10.0000 mg | ORAL_TABLET | Freq: Four times a day (QID) | ORAL | Status: DC
  Filled 2015-06-27 (×3): qty 1

## 2015-06-27 MED ORDER — BETHANECHOL CHLORIDE 10 MG PO TABS
10.0000 mg | ORAL_TABLET | Freq: Four times a day (QID) | ORAL | Status: AC
  Administered 2015-06-27 – 2015-06-28 (×3): 10 mg via ORAL
  Filled 2015-06-27 (×3): qty 1

## 2015-06-27 MED ORDER — CARBIDOPA-LEVODOPA ER 50-200 MG PO TBCR
2.0000 | EXTENDED_RELEASE_TABLET | Freq: Three times a day (TID) | ORAL | Status: DC
  Filled 2015-06-27 (×2): qty 2

## 2015-06-27 MED ORDER — HYDROCODONE-ACETAMINOPHEN 5-325 MG PO TABS
2.0000 | ORAL_TABLET | Freq: Once | ORAL | Status: AC
  Administered 2015-06-27: 2 via ORAL
  Filled 2015-06-27: qty 2

## 2015-06-27 MED ORDER — CARBIDOPA-LEVODOPA ER 50-200 MG PO TBCR
2.0000 | EXTENDED_RELEASE_TABLET | Freq: Three times a day (TID) | ORAL | Status: DC
  Administered 2015-06-27 – 2015-07-01 (×13): 2 via ORAL
  Filled 2015-06-27 (×15): qty 2

## 2015-06-27 NOTE — Clinical Documentation Improvement (Addendum)
Internal Medicine  Can the diagnosis of Hyperlipidemia be further specified? Thank you   Specify the type as being:  Group A- pure hypercholesterolemia.  Group B- pure hyperglyceridemia.  Group C- mixed hyperlipidemia.  Group D- hyperchylomicronemia.  Familial combined hyperlipidemia.  Other condition  Clinically Undertermined    Please exercise your independent, professional judgment when responding. A specific answer is not anticipated or expected.   Thank You, Menucha Dicesare J Nasean [employer] [place] [phone] 

## 2015-06-27 NOTE — Progress Notes (Signed)
[place] TEAM 1 - Stepdown/ICU TEAM PROGRESS NOTE  Jonathan Stafford ZOX:[id number] DOB: Dec 30, 1950 DOA: 06/25/2015 PCP: No primary care provider on file.  Admit HPI / Brief Narrative: 64 year old male with HxDepression, SVT and Atrial fibrillation with indwelling pacemaker/defibrillator, CAD, COPD, CKD stage 2, and Parkinson's disease admitted to [facility] 05/2015 for an ablation. He was there for 3 days and almost immediately after being discharged reported "not feeling right". Since that time he experienced significant functional decline. SOB developed which greatly reduced his functional status. He had been unable to ambulate 10 feet without becoming markedly short of breath.  10/25 he suffered a syncopal episode.  He presented to [facility] ED. CTA of the chest noted large volume emboli throughout bilateral lungs. RV/LV ratio 1.09. He was hypoxemic requiring venti mask to keep sats in 90s. Also due to fall he suffered R hip fracture.   SIGNIFICANT EVENTS: 10/25 > presented to [facility] with syncope. PE diagnosed 10/26 > transfer to [facility] for possible EKOS 10/26 > transfer to SDU and to [facility]  HPI/Subjective: The patient is having a significant amount hip pain but is tolerating it well.  He denies chest pain shortness of breath fevers chills nausea vomiting or abdominal pain.  Assessment/Plan:  Acute hypoxemic respiratory failure secondary to large volume pulmonary emboli Clinically stable at present - continue anticoagulation   Bilateral submassive pulmonary emboli or DVT -provoked in setting of recent hospitalization  -Heparin per pharmacy  -TTE - RV dysfunction moderate to severe   Rt hip fracture -s/p surgical correction - PT/OT have begun   Atrial fibrillation s/p recent ablation NSR presently - follow on tele   Syncope history of same, cardiogenic in past, suspect now secondary to PE  HTN BP currently well controlled  H/o CAD, Pacemaker, defibrillator  in-situ  HLD  CKD stage 2 Cr 1.3 on admission - improved w/ hydration - follow    ?Cirrhosis noted on CTA chest Check acute hepatitis panel - f/u LFTs in AM   COPD without acute exacerbation Well compensated at present   SIRS, no obvious source of infection  Resolved   DM managed by diet A1c pending - CBG well controlled  Parkinson's disease Cont home medical regimen   Depression  Code Status: FULL Family Communication: no family present at time of exam Disposition Plan: SDU overnight   Consultants: PCCM  Orthopedic Surgery   Procedures: 10/26 TTE EF 50% to 55% - grade 1 diastolic dysfunction - Right ventricle: Systolic function was moderately toseverely reduced 10/27 Venous dopplers - Subacute DVT noted in the left common femoral vein. Chronic vs indeterminate age DVT noted in the left popliteal vein. No DVT RLE. 10/27 R hip bipolar hemiarthroplasty   Antibiotics: none  DVT prophylaxis: IV heparin   Objective: Blood pressure 137/74, pulse 92, temperature 97.9 F (36.6 C), temperature source Oral, resp. rate 19, height  (1.803 m), weight 106.142 kg (234 lb), SpO2 94 %.  Intake/Output Summary (Last 24 hours) at 06/27/15 1016 Last data filed at 06/27/15 0700  Gross per 24 hour  Intake 1558.87 ml  Output   1250 ml  Net 308.87 ml   Exam: General: No acute respiratory distress Lungs: Clear to auscultation bilaterally without wheezes or crackles Cardiovascular: Regular rate and rhythm without murmur gallop or rub normal S1 and S2 Abdomen: Nontender, nondistended, soft, bowel sounds positive, no rebound, no ascites, no appreciable mass Extremities: No significant cyanosis, clubbing, or edema bilateral lower extremities  Data Reviewed:  Basic Metabolic Panel:  Recent Labs Lab 06/25/15 0341 06/25/15 0805 06/26/15 0225 06/27/15 0400  NA 137 136 133* 135  K 4.1 4.3 3.8 3.6  CL 103 101 99* 100*  CO2 26 27 26 23   GLUCOSE 140* 132* 139* 122*  BUN  22* 21* 19 15  CREATININE 1.26* 1.28* 1.19 1.05  CALCIUM 8.5* 8.4* 8.0* 8.1*  MG 1.9  --  2.2 2.0  PHOS 3.1  --  2.3*  --     CBC:  Recent Labs Lab 06/25/15 0805 06/26/15 0225 06/27/15 0400  WBC 11.8* 12.2* 9.8  NEUTROABS  --   --  5.5  HGB 13.6 12.7* 11.1*  HCT 41.2 37.5* 33.3*  MCV 95.8 94.9 93.3  PLT 201 199 199    Liver Function Tests:  Recent Labs Lab 06/25/15 0341 06/27/15 0400  AST 22 21  ALT 22 13*  ALKPHOS 32* 29*  BILITOT 0.9 0.6  PROT 6.2* 5.7*  ALBUMIN 3.3* 2.7*   Coags:  Recent Labs Lab 06/26/15 1715  INR 1.24    Recent Labs Lab 06/26/15 1715  APTT 29    Cardiac Enzymes:  Recent Labs Lab 06/25/15 0341 06/25/15 0805 06/25/15 1410  TROPONINI 0.06* 0.06* 0.04*    CBG:  Recent Labs Lab 06/25/15 0227 06/25/15 0740  GLUCAP 127* 127*    Recent Results (from the past 240 hour(s))  MRSA PCR Screening     Status: None   Collection Time: 06/25/15  2:11 AM  Result Value Ref Range Status   MRSA by PCR NEGATIVE NEGATIVE Final    Comment:        The GeneXpert MRSA Assay (FDA approved for NASAL specimens only), is one component of a comprehensive MRSA colonization surveillance program. It is not intended to diagnose MRSA infection nor to guide or monitor treatment for MRSA infections.      Studies:   Recent x-ray studies have been reviewed in detail by the Attending Physician  Scheduled Meds:  Scheduled Meds: . amiodarone  200 mg Oral Daily  . antiseptic oral rinse  7 mL Mouth Rinse BID  . aspirin  325 mg Oral Daily  . carbidopa-levodopa  2 tablet Oral BID  . carvedilol  6.25 mg Oral BID WC  . fenofibrate  160 mg Oral Daily  . mometasone-formoterol  2 puff Inhalation BID  . omega-3 acid ethyl esters  1 g Oral TID  . pantoprazole  40 mg Oral Daily  . simvastatin  20 mg Oral Daily  . spironolactone  25 mg Oral Daily  . tiotropium  18 mcg Inhalation Daily    Time spent on care of this patient: 35  mins   MCCLUNG,JEFFREY T , MD   [facility] Office  [phone] Pager - Text Page per Loretha StaplerAmion as per below:  On-Call/Text Page:      [email or web address]      password [email or web address]  If 7PM-7AM, please contact night-coverage [email or web address] Password [email or web address] 06/27/2015, 10:16 AM   LOS: 2 days

## 2015-06-27 NOTE — Progress Notes (Signed)
     Subjective:  POD#1 R hip hemiarthroplasty. Patient reports pain as mild to moderate.  Resting comfortably in bed this morning.  Heparin restarted after surgery for bilateral PE.  Will see how the patient mobilizes with PT today but suspect he will need SNF at discharge.   Objective:   VITALS:   Filed Vitals:   06/27/15 0400 06/27/15 0500 06/27/15 0743 06/27/15 0800  BP: 123/66 123/57  137/74  Pulse: 95 95 93 92  Temp:    97.9 F (36.6 C)  TempSrc:    Oral  Resp: 17 17 14 19   Height:      Weight:      SpO2: 93% 93% 95% 94%    Neurologically intact ABD soft Neurovascular intact Sensation intact distally Intact pulses distally Dorsiflexion/Plantar flexion intact Incision: dressing C/D/I   Lab Results  Component Value Date   WBC 9.8 06/27/2015   HGB 11.1* 06/27/2015   HCT 33.3* 06/27/2015   MCV 93.3 06/27/2015   PLT 199 06/27/2015   BMET    Component Value Date/Time   NA 135 06/27/2015 0400   K 3.6 06/27/2015 0400   CL 100* 06/27/2015 0400   CO2 23 06/27/2015 0400   GLUCOSE 122* 06/27/2015 0400   BUN 15 06/27/2015 0400   CREATININE 1.05 06/27/2015 0400   CALCIUM 8.1* 06/27/2015 0400   GFRNONAA >60 06/27/2015 0400   GFRAA >60 06/27/2015 0400     Assessment/Plan: 1 Day Post-Op   Active Problems:   Pulmonary embolism (HCC)   Up with therapy WBAT in the RLE with posterior hip precautions for 6 weeks Heparin drip resumed for bilateral PE   Heleena Miceli Marie 06/27/2015, 8:08 AM Cell [phone]

## 2015-06-27 NOTE — Progress Notes (Signed)
ANTICOAGULATION CONSULT NOTE - Follow Up Consult  Pharmacy Consult for Heparin Indication: pulmonary embolus  No Known Allergies  Patient Measurements: Height: 5\' 11"  (180.3 cm) Weight: 214 lb 15.2 oz (97.5 kg) IBW/kg (Calculated) : 75.3 Heparin Dosing Weight: 94 kg  Vital Signs: Temp: 98.1 F (36.7 C) (10/28 1125) Temp Source: Oral (10/28 1125) BP: 111/78 mmHg (10/28 1125) Pulse Rate: 95 (10/28 1125)  Labs:  Recent Labs  06/25/15 0341  06/25/15 0805  06/25/15 1410  06/26/15 0225 06/26/15 1715 06/27/15 0400 06/27/15 1246  HGB  --   < > 13.6  --   --   --  12.7*  --  11.1*  --   HCT  --   --  41.2  --   --   --  37.5*  --  33.3*  --   PLT  --   --  201  --   --   --  199  --  199  --   APTT  --   --   --   --   --   --   --  29  --   --   LABPROT  --   --   --   --   --   --   --  15.7*  --   --   INR  --   --   --   --   --   --   --  1.24  --   --   HEPARINUNFRC 0.17*  --   --   < >  --   < > 0.28*  --  0.22* 0.43  CREATININE 1.26*  --  1.28*  --   --   --  1.19  --  1.05  --   TROPONINI 0.06*  --  0.06*  --  0.04*  --   --   --   --   --   < > = values in this interval not displayed.  Estimated Creatinine Clearance: 83.5 mL/min (by C-G formula based on Cr of 1.05).  Assessment:   On IV heparin for submassive PE 06/24/15.    Right hip fracture due to fall with syncopal event prior on 06/24/15. Found to have submassive PE. [facility]     POD# 1 right THA.   Heparin drip resumed post-op on 10/27 pm.    Heparin level is now therapeutic (0.43) on 2400 units/hr, after rate increased from 2200 units/hr this am, when heparin level was 0.22.  Goal of Therapy:  Heparin level 0.3-0.7 units/ml Monitor platelets by anticoagulation protocol: Yes   Plan:   Continue Heparin drip at 2400 units/hr.  Daily heparin level and CBC.  Next labs in am.  Dennie Fettersgan, Karysa Heft Donovan, RPh Pager: [phone] 06/27/2015,2:28 PM

## 2015-06-27 NOTE — Consult Note (Signed)
Physical Medicine and Rehabilitation Consult Reason for Consult: Pulmonary emboli, right hip fracture with postop acute respiratory failure Referring Physician: [facility]   HPI: Jonathan Stafford is a 64 y.o. male with history of coronary artery disease, atrial fibrillation maintained on aspirin 325 mg daily, SVT with pacemaker, Parkinson's disease. Independent prior to admission living alone in first floor apt. With one step to enter. Patient does not drive. Sister and nephew assist as needed. Presented 06/24/2015 with reports of syncope and fall without loss of consciousness complaints of right hip pain. EKG was unremarkable. CTA of the chest showed large volume emboli throughout bilateral lungs. X-rays and imaging with right femoral neck fracture. Placed on intravenous heparin. Echocardiogram ejection fraction 55% grade 1 diastolic dysfunction no wall motion abnormalities. Venous Dopplers lower extremity showed subacute DVT left common femoral vein. Chronic versus indeterminate age DVT noted left popliteal vein. No DVT right lower extremity. Patient cleared for surgery underwent right hip bipolar hemiarthroplasty 06/26/2015 per Dr. Margarita Rana. Weightbearing as tolerated with posterior hip cautions for 6 weeks. Hospital course pain management. Currently remains on IV heparin as well as aspirin. Follow-up physical therapy evaluation after hip surgery pending. M.D. has requested physical medicine rehabilitation consult.   Review of Systems  Constitutional: Negative for fever and chills.  HENT: Negative for hearing loss.   Eyes: Negative for blurred vision and double vision.  Respiratory: Negative for cough and shortness of breath.   Cardiovascular: Positive for palpitations and leg swelling.  Gastrointestinal: Positive for constipation. Negative for nausea, vomiting and abdominal pain.       GERD  Genitourinary: Negative for dysuria and hematuria.  Musculoskeletal: Positive for joint pain  and falls.  Skin: Negative for rash.  Neurological: Positive for tremors. Negative for seizures, loss of consciousness and headaches.  Psychiatric/Behavioral: Positive for depression.  All other systems reviewed and are negative.  Past Medical History  Diagnosis Date  . Atrial fibrillation (HCC)   . CAD (coronary artery disease)   . HLD (hyperlipidemia)   . COPD (chronic obstructive pulmonary disease) (HCC)   . Chronic kidney disease (CKD), stage II (mild)   . SVT (supraventricular tachycardia) (HCC)   . Presence of combination internal cardiac defibrillator (ICD) and pacemaker   . GERD (gastroesophageal reflux disease)   . Parkinson's disease (HCC)   . Depression    History reviewed. No pertinent past surgical history. History reviewed. No pertinent family history. Social History:  reports that he has been smoking.  He does not have any smokeless tobacco history on file. His alcohol and drug histories are not on file. Allergies: No Known Allergies Medications Prior to Admission  Medication Sig Dispense Refill  . amiodarone (PACERONE) 200 MG tablet Take 200 mg by mouth daily.    [name] aspirin 325 MG EC tablet Take 325 mg by mouth daily.    . carbidopa-levodopa (SINEMET CR) 50-200 MG tablet Take 2 tablets by mouth 3 (three) times daily.     . carvedilol (COREG) 6.25 MG tablet Take 6.25 mg by mouth 2 (two) times daily with a meal.    . fenofibrate (TRICOR) 145 MG tablet Take 145 mg by mouth daily.    . Fluticasone-Salmeterol (ADVAIR) 250-50 MCG/DOSE AEPB Inhale 1 puff into the lungs 2 (two) times daily.    [name] omega-3 acid ethyl esters (LOVAZA) 1 G capsule Take 1 g by mouth 3 (three) times daily.    . pantoprazole (PROTONIX) 40 MG tablet Take 40 mg by mouth daily.    [name]  ranolazine (RANEXA) 500 MG 12 hr tablet Take 500 mg by mouth 2 (two) times daily.    . simvastatin (ZOCOR) 20 MG tablet Take 20 mg by mouth daily.    [name] spironolactone (ALDACTONE) 25 MG tablet Take 25 mg by mouth daily.    .  sucralfate (CARAFATE) 1 G tablet Take 1 g by mouth 4 (four) times daily.    [name] tiotropium (SPIRIVA) 18 MCG inhalation capsule Place 18 mcg into inhaler and inhale daily.    . Vortioxetine HBr (BRINTELLIX) 20 MG TABS Take 20 mg by mouth daily.    [name] zolpidem (AMBIEN) 5 MG tablet Take 5-10 mg by mouth at bedtime as needed for sleep.      Home: Home Living Family/patient expects to be discharged to:: Private residence Living Arrangements: Alone Available Help at Discharge: Family, Other (Comment), Available PRN/intermittently (sister, nephew next door; sister unable to physically assist) Type of Home: Apartment Home Access: Level entry Home Layout: One level Bathroom Shower/Tub: Tub/shower unit, Engineer, building services: Standard Home Equipment: Arts development officer, Environmental consultant - 2 wheels, Shower seat  Functional History: Prior Function Level of Independence: Independent with assistive device(s) (RW) Functional Status:  Mobility: Bed Mobility Overal bed mobility: Needs Assistance, +2 for physical assistance Bed Mobility: Supine to Sit Supine to sit: Mod assist, +2 for physical assistance General bed mobility comments: VCs to maintain hip precautions        ADL: ADL Overall ADL's : Needs assistance/impaired Eating/Feeding: Independent  Cognition: Cognition Overall Cognitive Status: Within Functional Limits for tasks assessed Orientation Level: Oriented X4 Cognition Arousal/Alertness: Awake/alert Behavior During Therapy: WFL for tasks assessed/performed Overall Cognitive Status: Within Functional Limits for tasks assessed  Blood pressure 99/84, pulse 96, temperature 97.9 F (36.6 C), temperature source Oral, resp. rate 19, height 5\' 11"  (1.803 m), weight 106.142 kg (234 lb), SpO2 96 %. Physical Exam  Vitals reviewed. Constitutional: He is oriented to person, place, and time. He appears well-developed and well-nourished.  HENT:  Head: Normocephalic and atraumatic.  Eyes:  Conjunctivae and EOM are normal.  Neck: Normal range of motion. Neck supple. No thyromegaly present.  Cardiovascular: Regular rhythm.   No murmur heard. Tachycardic  Respiratory: Effort normal and breath sounds normal. No respiratory distress.  +[place]  GI: Soft. Bowel sounds are normal. He exhibits no distension.  Musculoskeletal: He exhibits edema and tenderness.  Strength b/l UE 5/5 grossly RLE hip flexion 2/5, ankle dorsi/plantar flexion 5/5 LLE hip flexion 4-/5, ankle dorsi/plantar flexion 5/5  Neurological: He is alert and oriented to person, place, and time.  DTRs symmetric   Skin: Skin is warm and dry.  Hip incision is dressed appropriately tender Scattered abrasions RLE  Psychiatric: He has a normal mood and affect. His behavior is normal.    Results for orders placed or performed during the hospital encounter of 06/25/15 (from the past 24 hour(s))  Protime-INR     Status: Abnormal   Collection Time: 06/26/15  5:15 PM  Result Value Ref Range   Prothrombin Time 15.7 (H) 11.6 - 15.2 seconds   INR 1.24 0.00 - 1.49  APTT     Status: None   Collection Time: 06/26/15  5:15 PM  Result Value Ref Range   aPTT 29 24 - 37 seconds  Comprehensive metabolic panel     Status: Abnormal   Collection Time: 06/27/15  4:00 AM  Result Value Ref Range   Sodium 135 135 - 145 mmol/L   Potassium 3.6 3.5 - 5.1 mmol/L  Chloride 100 (L) 101 - 111 mmol/L   CO2 23 22 - 32 mmol/L   Glucose, Bld 122 (H) 65 - 99 mg/dL   BUN 15 6 - 20 mg/dL   Creatinine, Ser 1.61 0.61 - 1.24 mg/dL   Calcium 8.1 (L) 8.9 - 10.3 mg/dL   Total Protein 5.7 (L) 6.5 - 8.1 g/dL   Albumin 2.7 (L) 3.5 - 5.0 g/dL   AST 21 15 - 41 U/L   ALT 13 (L) 17 - 63 U/L   Alkaline Phosphatase 29 (L) 38 - 126 U/L   Total Bilirubin 0.6 0.3 - 1.2 mg/dL   GFR calc non Af Amer >60 >60 mL/min   GFR calc Af Amer >60 >60 mL/min   Anion gap 12 5 - 15  CBC with Differential/Platelet     Status: Abnormal   Collection Time: 06/27/15  4:00 AM    Result Value Ref Range   WBC 9.8 4.0 - 10.5 K/uL   RBC 3.57 (L) 4.22 - 5.81 MIL/uL   Hemoglobin 11.1 (L) 13.0 - 17.0 g/dL   HCT 09.6 (L) 04.5 - 40.9 %   MCV 93.3 78.0 - 100.0 fL   MCH 31.1 26.0 - 34.0 pg   MCHC 33.3 30.0 - 36.0 g/dL   RDW 81.1 91.4 - 78.2 %   Platelets 199 150 - 400 K/uL   Neutrophils Relative % 56 %   Neutro Abs 5.5 1.7 - 7.7 K/uL   Lymphocytes Relative 25 %   Lymphs Abs 2.4 0.7 - 4.0 K/uL   Monocytes Relative 14 %   Monocytes Absolute 1.4 (H) 0.1 - 1.0 K/uL   Eosinophils Relative 5 %   Eosinophils Absolute 0.4 0.0 - 0.7 K/uL   Basophils Relative 0 %   Basophils Absolute 0.0 0.0 - 0.1 K/uL  Magnesium     Status: None   Collection Time: 06/27/15  4:00 AM  Result Value Ref Range   Magnesium 2.0 1.7 - 2.4 mg/dL  Lipid panel     Status: Abnormal   Collection Time: 06/27/15  4:00 AM  Result Value Ref Range   Cholesterol 125 0 - 200 mg/dL   Triglycerides 956 (H) <150 mg/dL   HDL 32 (L) >21 mg/dL   Total CHOL/HDL Ratio 3.9 RATIO   VLDL 31 0 - 40 mg/dL   LDL Cholesterol 62 0 - 99 mg/dL  Heparin level (unfractionated)     Status: Abnormal   Collection Time: 06/27/15  4:00 AM  Result Value Ref Range   Heparin Unfractionated 0.22 (L) 0.30 - 0.70 IU/mL   Pelvis Portable  06/26/2015  CLINICAL DATA:  Postop right hip arthroplasty. EXAM: PORTABLE PELVIS 1-2 VIEWS COMPARISON:  06/24/2015 FINDINGS: Patient has undergone right hip hemiarthroplasty. The femoral head component appears seated within the acetabulum on this frontal view. No acute fractures are identified. IMPRESSION: Status post hemi arthroplasty.  No adverse features. Electronically Signed   By: Norva Pavlov M.D.   On: 06/26/2015 20:23    Assessment/Plan: Diagnosis: Right hip fracture with postop acute respiratory failure with hx of Parkinson's Labs and images independently reviewed.  Records reviewed and summated above. Continue incentive spirometry to prevent atelectasis.   Fall precautions.  Cont  statin, BB, ASA, at home BP control goal <150 SBP per JNC 8 Oral pharmacological pain control  Bowel program: consider colace, miralax and/or Senna. PRN suppository Monitor surgical wound and skin especially over pressure sensitive areas Prevent immobility complications: pressure ulcers, contractures, HO Consider modalities, such as TENs  1. Does the need for close, 24 hr/day medical supervision in concert with the patient's rehab needs make it unreasonable for this patient to be served in a less intensive setting? Yes 2. Co-Morbidities requiring supervision/potential complications:  coronary artery disease (Monitor in accordance with increased physical exertion and avoid UE resistance exercises, cont meds), atrial fibrillation (Cont meds, monitor with increased activity), SVT with pacemaker (Monitor with therapies), Parkinson's disease (Cont meds, use adaptive techniques to address limitations, when necessary, i.e. Wrist weights, metronome, etc.), tachypnea, post-op pain (Consider transition to oral pain meds), ABLA (transfuse if necessary to ensure appropriate perfusion for increased activity tolerance), PE (Cont meds and monitor for signs of increased distress) 3. Due to safety, skin/wound care, disease management, medication administration, pain management and patient education, does the patient require 24 hr/day rehab nursing? Yes 4. Does the patient require coordinated care of a physician, rehab nurse, PT (1-2 hrs/day, 5 days/week) and OT (1-2 hrs/day, 5 days/week) to address physical and functional deficits in the context of the above medical diagnosis(es)? Yes Addressing deficits in the following areas: bathing, dressing, toileting and psychosocial support, strength, endurance, mobility, balance, safety, transfers, ambulation, and ADL's with assistive devices as necessary.  5. Can the patient actively participate in an intensive therapy program of at least 3 hrs of therapy per day at least 5  days per week? Potentially 6. The potential for patient to make measurable gains while on inpatient rehab is excellent 7. Anticipated functional outcomes upon discharge from inpatient rehab are modified independent  with PT, independent and modified independent with OT, n/a with SLP. 8. Estimated rehab length of stay to reach the above functional goals is: 12-15 days. 9. Does the patient have adequate social supports and living environment to accommodate these discharge functional goals? Potentially 10. Anticipated D/C setting: Home 11. Anticipated post D/C treatments: HH therapy and Home excercise program 12. Overall Rehab/Functional Prognosis: excellent  RECOMMENDATIONS: This patient's condition is appropriate for continued rehabilitative care in the following setting: CIR Patient has agreed to participate in recommended program. Yes Note that insurance prior authorization may be required for reimbursement for recommended care.  Comment: Rehab Admissions Coordinator to follow up.  Maryla MorrowAnkit Patel, MD 06/27/2015

## 2015-06-27 NOTE — Clinical Documentation Improvement (Addendum)
Internal Medicine  Can the diagnosis of Atrial Fibrillation be further specified? Thank you    Chronic Atrial fibrillation  Paroxysmal Atrial fibrillation  Permanent Atrial fibrillation  Persistent Atrial fibrillation  Other  Clinically Undetermined  Document any associated diagnoses/conditions.     Please exercise your independent, professional judgment when responding. A specific answer is not anticipated or expected.   Thank You,  Lavonda JumboLawanda J Kalynne [facility] [place] [phone]

## 2015-06-27 NOTE — Evaluation (Signed)
Physical Therapy Evaluation Patient Details Name: Jonathan Stafford MRN: [id number] DOB: Jul 14, 1951 Today's Date: 06/27/2015   History of Present Illness  64 year old male with PMH of cardiovascular disease including tachyarrhythmia. Has had progressive SOB over the past few weeks. Syncopal episode 10/25 with Mildly displaced, non-comminuted, fracture of R femoral neck . Found to have large PE with significant clot burden and increased RV/LV ratio. Transferred to [facility] for possible EKOS. S/P HEMIARTHROPLASTY 06/26/15  Clinical Impression  Pt presents with impairments as indicated below.  He would benefit from further skilled acute PT services to maximize safety and independence with functional mobility and to ensure understanding/compliance with posterior hip precautions.  PT recommends CIR to progress pt to modified independence prior to discharge home.    Follow Up Recommendations CIR;Supervision for mobility/OOB    Equipment Recommendations  Rolling walker with 5" wheels;None recommended by PT    Recommendations for Other Services Rehab consult     Precautions / Restrictions Precautions Precautions: Posterior Hip Precaution Booklet Issued: Yes (comment) (reviewed with pt) Precaution Comments: Pt educated on posterior hip precautions with good recall. Restrictions Weight Bearing Restrictions: No RLE Weight Bearing: Weight bearing as tolerated      Mobility  Bed Mobility Overal bed mobility: Needs Assistance;+2 for physical assistance Bed Mobility: Supine to Sit     Supine to sit: Mod assist;+2 for physical assistance     General bed mobility comments: VCs to maintain hip precautions  Transfers Overall transfer level: Needs assistance Equipment used: Rolling walker (2 wheeled);2 person hand held assist Transfers: Sit to/from Stand Sit to Stand: Max assist;+2 physical assistance;From elevated surface            Ambulation/Gait Ambulation/Gait assistance: +2 physical  assistance;Mod assist Ambulation Distance (Feet): 5 Feet Assistive device: Rolling walker (2 wheeled) Gait Pattern/deviations: Step-to pattern;Antalgic;Trunk flexed Gait velocity: slow Gait velocity interpretation: <1.8 ft/sec, indicative of risk for recurrent falls General Gait Details: Antalgic gait.  VCs for technique.  Stairs            Wheelchair Mobility    Modified Rankin (Stroke Patients Only)       Balance Overall balance assessment: Needs assistance Sitting-balance support: No upper extremity supported;Feet unsupported Sitting balance-Leahy Scale: Fair     Standing balance support: Bilateral upper extremity supported Standing balance-Leahy Scale: Poor                               Pertinent Vitals/Pain Pain Assessment: 0-10 Pain Score: 5  Pain Location: RLE Pain Descriptors / Indicators: Aching;Guarding;Operative site guarding Pain Intervention(s): Limited activity within patient's tolerance;Premedicated before session;Monitored during session    Home Living Family/patient expects to be discharged to:: Private residence Living Arrangements: Alone Available Help at Discharge: Family;Other (Comment);Available PRN/intermittently Type of Home: Apartment Home Access: Level entry     Home Layout: One level Home Equipment: Bedside commode;Walker - 2 wheels;Shower seat      Prior Function Level of Independence: Independent with assistive device(s) (RW)               Hand Dominance   Dominant Hand: Right    Extremity/Trunk Assessment   Upper Extremity Assessment: Overall WFL for tasks assessed           Lower Extremity Assessment: RLE deficits/detail RLE Deficits / Details: s/p R femur hemiarthroplasty       Communication   Communication: No difficulties  Cognition Arousal/Alertness: Awake/alert Behavior During Therapy: [facility]  for tasks assessed/performed Overall Cognitive Status: Within Functional Limits for tasks  assessed                      General Comments      Exercises        Assessment/Plan    PT Assessment Patient needs continued PT services  PT Diagnosis Difficulty walking;Acute pain   PT Problem List Decreased strength;Decreased range of motion;Decreased activity tolerance;Decreased balance;Decreased mobility;Decreased coordination;Decreased knowledge of use of DME;Decreased safety awareness;Decreased knowledge of precautions  PT Treatment Interventions DME instruction;Gait training;Functional mobility training;Therapeutic activities;Therapeutic exercise;Balance training;Patient/family education   PT Goals (Current goals can be found in the Care Plan section) Acute Rehab PT Goals Patient Stated Goal: to not go to SNF PT Goal Formulation: With patient Time For Goal Achievement: 07/11/15 Potential to Achieve Goals: Good    Frequency Min 4X/week   Barriers to discharge Decreased caregiver support      Co-evaluation PT/OT/SLP Co-Evaluation/Treatment: Yes Reason for Co-Treatment: Complexity of the patient's impairments (multi-system involvement);For patient/therapist safety PT goals addressed during session: Mobility/safety with mobility;Proper use of DME OT goals addressed during session: Strengthening/ROM       End of Session Equipment Utilized During Treatment: Gait belt;Oxygen Activity Tolerance: Patient tolerated treatment well Patient left: in chair;with call bell/phone within reach;with chair alarm set;with nursing/sitter in room Nurse Communication: Mobility status         Time: [id number] PT Time Calculation (min) (ACUTE ONLY): 32 min   Charges:    1 PT Visit 1 PT Eval      PT G CodesArnoldo Morale:        Cher Egnor 06/27/2015, 12:35 PM  Arnoldo MoraleAmanda Lucille Witts, SPT

## 2015-06-27 NOTE — Progress Notes (Signed)
ANTICOAGULATION CONSULT NOTE - Follow Up Consult  Pharmacy Consult for Heparin  Indication: pulmonary embolus  No Known Allergies  Patient Measurements: Height: 5\' 11"  (180.3 cm) Weight: 234 lb (106.142 kg) IBW/kg (Calculated) : 75.3  Vital Signs: Temp: 98.6 F (37 C) (10/28 0300) Temp Source: Oral (10/28 0300) BP: 123/66 mmHg (10/28 0400) Pulse Rate: 95 (10/28 0400)  Labs:  Recent Labs  06/25/15 0341 06/25/15 0805  06/25/15 1410 06/25/15 2056 06/26/15 0225 06/26/15 1715 06/27/15 0400  HGB  --  13.6  --   --   --  12.7*  --   --   HCT  --  41.2  --   --   --  37.5*  --   --   PLT  --  201  --   --   --  199  --   --   APTT  --   --   --   --   --   --  29  --   LABPROT  --   --   --   --   --   --  15.7*  --   INR  --   --   --   --   --   --  1.24  --   HEPARINUNFRC 0.17*  --   < >  --  0.37 0.28*  --  0.22*  CREATININE 1.26* 1.28*  --   --   --  1.19  --   --   TROPONINI 0.06* 0.06*  --  0.04*  --   --   --   --   < > = values in this interval not displayed.  Estimated Creatinine Clearance: 76.7 mL/min (by C-G formula based on Cr of 1.19).  Assessment: HL is sub-therapeutic this AM after re-start s/p ortho surgery. No issues per RN.   Goal of Therapy:  Heparin level 0.3-0.7 units/ml Monitor platelets by anticoagulation protocol: Yes   Plan:  -Increase heparin to 2400 units/hr -1300 HL -Daily CBC/HL -Monitor for bleeding  Abran DukeLedford, Johnthan Axtman 06/27/2015,5:40 AM

## 2015-06-27 NOTE — Evaluation (Signed)
Occupational Therapy Evaluation Patient Details Name: Jonathan Stafford MRN: [id number] DOB: 1951/01/03 Today's Date: 06/27/2015    History of Present Illness 64 year old male with PMH of cardiovascular disease including tachyarrhythmia. Has had progressive SOB over the past few weeks. Syncopal episode 10/25 with Mildly displaced, non-comminuted, fracture of R femoral neck . Found to have large PE with significant clot burden and increased RV/LV ratio. Transferred to [facility] for possible EKOS. S/P HEMIARTHROPLASTY 06/26/15   Clinical Impression   This 64 yo male admitted and underwent above presents to acute OT with decreased balance, decreased mobility, posterior hip precautions, and increased pain all affecting his ability to care for himself at an independent level as he was pta. He will benefit from acute OT with follow up OT on CIR to get to a Mod I to return home with only occassional A from sister and nephew that live next door.    Follow Up Recommendations  CIR    Equipment Recommendations   (TBD next venue)       Precautions / Restrictions Precautions Precautions: Posterior Hip Precaution Booklet Issued: Yes (comment) (reviewed with pt) Precaution Comments: Pt educated on posterior hip precautions with good recall. Restrictions Weight Bearing Restrictions: No RLE Weight Bearing: Weight bearing as tolerated      Mobility Bed Mobility Overal bed mobility: Needs Assistance;+2 for physical assistance Bed Mobility: Supine to Sit     Supine to sit: Mod assist;+2 for physical assistance     General bed mobility comments: VCs to maintain hip precautions  Transfers Overall transfer level: Needs assistance Equipment used: Rolling walker (2 wheeled);2 person hand held assist Transfers: Sit to/from Stand Sit to Stand: Max assist;+2 physical assistance;From elevated surface              Balance Overall balance assessment: Needs assistance Sitting-balance support: No  upper extremity supported;Feet supported Sitting balance-Leahy Scale: Fair     Standing balance support: Bilateral upper extremity supported Standing balance-Leahy Scale: Poor                              ADL Overall ADL's : Needs assistance/impaired Eating/Feeding: Independent;Sitting   Grooming: Set up;Sitting   Upper Body Bathing: Set up;Sitting   Lower Body Bathing: Maximal assistance (with Max +2 sit<>stand from raised bed)   Upper Body Dressing : Set up;Sitting   Lower Body Dressing: Total assistance (with Max +2 sit<>stand from raised bed)   Toilet Transfer: Maximal assistance;+2 for physical assistance;Ambulation;RW (raised bed>8 feet>sit in recliner behind him)   Toileting- ArchitectClothing Manipulation and Hygiene: Total assistance (with Max +2 sit<>stand from raised bed)                         Pertinent Vitals/Pain Pain Assessment: 0-10 Pain Score: 5  Pain Location: RLE Pain Descriptors / Indicators: Aching;Guarding;Operative site guarding Pain Intervention(s): Limited activity within patient's tolerance;Premedicated before session;Monitored during session     Hand Dominance Right   Extremity/Trunk Assessment Upper Extremity Assessment Upper Extremity Assessment: Overall WFL for tasks assessed   Lower Extremity Assessment Lower Extremity Assessment: RLE deficits/detail RLE Deficits / Details: s/p R femur hemiarthroplasty       Communication Communication Communication: No difficulties   Cognition Arousal/Alertness: Awake/alert Behavior During Therapy: WFL for tasks assessed/performed Overall Cognitive Status: Within Functional Limits for tasks assessed  Home Living Family/patient expects to be discharged to:: Private residence Living Arrangements: Alone Available Help at Discharge: Family;Other (Comment);Available PRN/intermittently (sister, nephew next door; sister unable to physically  assist) Type of Home: Apartment Home Access: Level entry     Home Layout: One level     Bathroom Shower/Tub: Tub/shower unit;Curtain Shower/tub characteristics: Engineer, building services: Standard     Home Equipment: Bedside commode;Walker - 2 wheels;Shower seat          Prior Functioning/Environment Level of Independence: Independent with assistive device(s) (RW)             OT Diagnosis: Generalized weakness;Acute pain   OT Problem List: Decreased strength;Decreased range of motion;Impaired balance (sitting and/or standing);Pain;Decreased knowledge of use of DME or AE;Decreased knowledge of precautions   OT Treatment/Interventions: Self-care/ADL training;Patient/family education;Balance training;DME and/or AE instruction;Therapeutic activities    OT Goals(Current goals can be found in the care plan section) Acute Rehab OT Goals Patient Stated Goal: to not go to SNF OT Goal Formulation: With patient Time For Goal Achievement: 07/11/15 Potential to Achieve Goals: Good  OT Frequency: Min 2X/week   Barriers to D/C: Decreased caregiver support          Co-evaluation PT/OT/SLP Co-Evaluation/Treatment: Yes Reason for Co-Treatment: Complexity of the patient's impairments (multi-system involvement);For patient/therapist safety PT goals addressed during session: Mobility/safety with mobility;Proper use of DME OT goals addressed during session: Strengthening/ROM      End of Session Equipment Utilized During Treatment: Gait belt;Rolling walker Nurse Communication: Mobility status (+2 max A sit<>stand;+2 min-mod A ambulation; posterior hip precautions placed above HOB)  Activity Tolerance: Patient tolerated treatment well Patient left: in chair;with call bell/phone within reach;with chair alarm set   Time: 1610-9604 OT Time Calculation (min): 33 min Charges:  OT General Charges $OT Visit: 1 Procedure OT Evaluation $Initial OT Evaluation Tier I: 1  Procedure  Evette Georges [phone] 06/27/2015, 12:18 PM

## 2015-06-28 ENCOUNTER — Encounter (HOSPITAL_COMMUNITY): Payer: Self-pay | Admitting: Orthopedic Surgery

## 2015-06-28 DIAGNOSIS — I82402 Acute embolism and thrombosis of unspecified deep veins of left lower extremity: Secondary | ICD-10-CM

## 2015-06-28 DIAGNOSIS — S72001A Fracture of unspecified part of neck of right femur, initial encounter for closed fracture: Secondary | ICD-10-CM

## 2015-06-28 HISTORY — DX: Fracture of unspecified part of neck of right femur, initial encounter for closed fracture: S72.001A

## 2015-06-28 LAB — CBC
HEMATOCRIT: 31.1 % — AB (ref 39.0–52.0)
Hemoglobin: 10.6 g/dL — ABNORMAL LOW (ref 13.0–17.0)
MCH: 32.1 pg (ref 26.0–34.0)
MCHC: 34.1 g/dL (ref 30.0–36.0)
MCV: 94.2 fL (ref 78.0–100.0)
Platelets: 225 10*3/uL (ref 150–400)
RBC: 3.3 MIL/uL — AB (ref 4.22–5.81)
RDW: 12.9 % (ref 11.5–15.5)
WBC: 11.2 10*3/uL — AB (ref 4.0–10.5)

## 2015-06-28 LAB — COMPREHENSIVE METABOLIC PANEL
ALBUMIN: 2.4 g/dL — AB (ref 3.5–5.0)
ALT: 5 U/L — ABNORMAL LOW (ref 17–63)
ANION GAP: 10 (ref 5–15)
AST: 21 U/L (ref 15–41)
Alkaline Phosphatase: 28 U/L — ABNORMAL LOW (ref 38–126)
BILIRUBIN TOTAL: 0.7 mg/dL (ref 0.3–1.2)
BUN: 12 mg/dL (ref 6–20)
CHLORIDE: 98 mmol/L — AB (ref 101–111)
CO2: 26 mmol/L (ref 22–32)
Calcium: 8.1 mg/dL — ABNORMAL LOW (ref 8.9–10.3)
Creatinine, Ser: 1.03 mg/dL (ref 0.61–1.24)
GFR calc Af Amer: >60 mL/min (ref >60)
GFR calc non Af Amer: >60 mL/min (ref >60)
GLUCOSE: 123 mg/dL — AB (ref 65–99)
POTASSIUM: 4.2 mmol/L (ref 3.5–5.1)
SODIUM: 134 mmol/L — AB (ref 135–145)
TOTAL PROTEIN: 5.6 g/dL — AB (ref 6.5–8.1)

## 2015-06-28 LAB — HEPARIN LEVEL (UNFRACTIONATED)
Heparin Unfractionated: 0.27 IU/mL — ABNORMAL LOW (ref 0.30–0.70)
Heparin Unfractionated: 0.22 IU/mL — ABNORMAL LOW (ref 0.30–0.70)

## 2015-06-28 LAB — HEMOGLOBIN A1C
Hgb A1c MFr Bld: 5.9 % — ABNORMAL HIGH (ref 4.8–5.6)
Mean Plasma Glucose: 123 mg/dL

## 2015-06-28 MED ORDER — RANOLAZINE ER 500 MG PO TB12
500.0000 mg | ORAL_TABLET | Freq: Two times a day (BID) | ORAL | Status: DC
  Administered 2015-06-28 – 2015-07-01 (×7): 500 mg via ORAL
  Filled 2015-06-28 (×7): qty 1

## 2015-06-28 MED ORDER — VORTIOXETINE HBR 20 MG PO TABS
20.0000 mg | ORAL_TABLET | Freq: Every day | ORAL | Status: DC
  Administered 2015-06-28 – 2015-07-01 (×4): 20 mg via ORAL
  Filled 2015-06-28 (×5): qty 20

## 2015-06-28 MED ORDER — SENNOSIDES-DOCUSATE SODIUM 8.6-50 MG PO TABS
1.0000 | ORAL_TABLET | Freq: Two times a day (BID) | ORAL | Status: DC
  Administered 2015-06-28 – 2015-07-01 (×7): 1 via ORAL
  Filled 2015-06-28 (×7): qty 1

## 2015-06-28 MED ORDER — MAGNESIUM CITRATE PO SOLN
1.0000 | Freq: Once | ORAL | Status: DC | PRN
  Filled 2015-06-28: qty 296

## 2015-06-28 MED ORDER — POLYETHYLENE GLYCOL 3350 17 G PO PACK
17.0000 g | PACK | Freq: Two times a day (BID) | ORAL | Status: DC
  Administered 2015-06-28 – 2015-07-01 (×6): 17 g via ORAL
  Filled 2015-06-28 (×8): qty 1

## 2015-06-28 MED ORDER — HEPARIN BOLUS VIA INFUSION
2000.0000 [IU] | Freq: Once | INTRAVENOUS | Status: AC
  Administered 2015-06-28: 2000 [IU] via INTRAVENOUS
  Filled 2015-06-28: qty 2000

## 2015-06-28 NOTE — Progress Notes (Signed)
ANTICOAGULATION CONSULT NOTE - Follow Up Consult  Pharmacy Consult for Heparin Indication: pulmonary embolus  No Known Allergies  Patient Measurements: Height: 5\' 11"  (180.3 cm) Weight: 230 lb 6.4 oz (104.509 kg) IBW/kg (Calculated) : 75.3 Heparin Dosing Weight: 94 kg  Vital Signs: Temp: 98 F (36.7 C) (10/29 1156) Temp Source: Oral (10/29 1156) BP: 107/63 mmHg (10/29 1300) Pulse Rate: 87 (10/29 1300)  Labs:  Recent Labs  06/26/15 0225 06/26/15 1715 06/27/15 0400 06/27/15 1246 06/28/15 0242  HGB 12.7*  --  11.1*  --  10.6*  HCT 37.5*  --  33.3*  --  31.1*  PLT 199  --  199  --  225  APTT  --  29  --   --   --   LABPROT  --  15.7*  --   --   --   INR  --  1.24  --   --   --   HEPARINUNFRC 0.28*  --  0.22* 0.43 0.27*  CREATININE 1.19  --  1.05  --  1.03    Estimated Creatinine Clearance: 88 mL/min (by C-G formula based on Cr of 1.03).  Assessment: 64 y/o M on IV heparin for submassive PE 06/24/15. Right hip fracture due to fall with syncopal event prior on 06/24/15. Found to have submassive PE. [facility]. POD# 2 right THA. Heparin drip resumed post-op on 10/27 pm. HL was therapeutic at 0.42 on 2400u/hr, however, AM HL came back at 0.27. Hgb 10.6, plts 225, CrCl 88 mL/min.   Goal of Therapy:  Heparin level 0.3-0.7 units/ml Monitor platelets by anticoagulation protocol: Yes   Plan:  Increase heparin to 2600 units/hr Will check 6 hr HL (2030) Daily HL and CBC  Sandi CarneNick Julene Rahn, PharmD Pharmacy Resident Pager: [phone]  06/28/2015,2:22 PM

## 2015-06-28 NOTE — Clinical Social Work Note (Addendum)
Clinical Social Work Assessment  Patient Details  Name: Jonathan Stafford MRN: [id number] Date of Birth: 02/24/1951  Date of referral:  06/28/15               Reason for consult:  Facility Placement                Permission sought to share information with:  Other Permission granted to share information::  Yes, Verbal Permission Granted  Name::     [place]per::     Relationship::     Contact Information:     Housing/Transportation Living arrangements for the past 2 months:  Apartment Source of Information:  Patient Patient Interpreter Needed:  None Criminal Activity/Legal Involvement Pertinent to Current Situation/Hospitalization:  No - Comment as needed Significant Relationships:  Siblings, Adult Children, Other Family Members Lives with:  Self Do you feel safe going back to the place where you live?  Yes Need for family participation in patient care:  No (Coment)  Care giving concerns: Is agreeable to go to CIR for short term rehab; agrees with PT's recommendation. Lives home alone.  Social Worker assessment / plan:  CSW met with patient this afternoon to discuss PT's recommendation for CIR and possible other d/c options for after hospital care. Patient states that he would agree to CIR but adamantly refuses SNF option.  Patient states that his sister lives next door in her own apartment and he has a great deal of family support including his son, nephew and other family.  Patient states that he was independent of his ADL's prior to this hospitalization and feels that he would be able to manage well at home. He is agreeable to [facility] if he cannot go to CIR. Does not have a preference for [facility] agencies.  Note left for weekend [facility] re: above. CSW discussed with patient that due to his current [employer]- it may not pay for CIR. He verbalizes understanding of same and feels home would be only other option to CIR. CSW will sign off  but available to assist should d/c plan change.    Employment status:  Retired Nurse, adult PT Recommendations:  Inpatient [place] / Referral to community resources:   (None at this time)  Patient/Family's Response to care:  Patient feels he is doing better and wants to get better to return home. He does not feel the need to consider short term SNF. "I am already doing much better."  Patient/Family's Understanding of and Emotional Response to Diagnosis, Current Treatment, and Prognosis: Patient is able to verbalize a good understanding of his current medical condition, treatment needs and prognosis. He states that he recognizes the benefits of rehab and that he wants to go home as soon as possible but does not feel that SNF placement would be a good "fit" for him.   Emotional Assessment Appearance:  Appears stated age Attitude/Demeanor/Rapport:   (Cooperative, rational, relaxed) Affect (typically observed):  Pleasant, Quiet, Calm Orientation:  Oriented to Self, Oriented to Place, Oriented to  Time, Oriented to Situation Alcohol / Substance use:  Tobacco Use Psych involvement (Current and /or in the community):  No (Comment)  Discharge Needs  Concerns to be addressed:  Care Coordination Readmission within the last 30 days:  No Current discharge risk:  Lives alone Barriers to Discharge:  Continued Medical Work up   Kendell Bane T, Marlinda Mike 06/28/2015, 6:10 PM

## 2015-06-28 NOTE — Progress Notes (Signed)
ANTICOAGULATION CONSULT NOTE - Follow Up Consult  Pharmacy Consult for Heparin Indication: pulmonary embolus  No Known Allergies  Patient Measurements: Height: 5\' 11"  (180.3 cm) Weight: 230 lb 6.4 oz (104.509 kg) IBW/kg (Calculated) : 75.3 Heparin Dosing Weight: 94 kg  Vital Signs: Temp: 99.6 F (37.6 C) (10/29 2013) Temp Source: Oral (10/29 2013) BP: 108/61 mmHg (10/29 2013) Pulse Rate: 84 (10/29 2013)  Labs:  Recent Labs  06/26/15 0225 06/26/15 1715 06/27/15 0400 06/27/15 1246 06/28/15 0242 06/28/15 2019  HGB 12.7*  --  11.1*  --  10.6*  --   HCT 37.5*  --  33.3*  --  31.1*  --   PLT 199  --  199  --  225  --   APTT  --  29  --   --   --   --   LABPROT  --  15.7*  --   --   --   --   INR  --  1.24  --   --   --   --   HEPARINUNFRC 0.28*  --  0.22* 0.43 0.27* 0.22*  CREATININE 1.19  --  1.05  --  1.03  --     Estimated Creatinine Clearance: 88 mL/min (by C-G formula based on Cr of 1.03).  Assessment: 64 y/o M on IV heparin for submassive PE 06/24/15. Right hip fracture due to fall with syncopal event prior on 06/24/15. Found to have submassive PE. [facility] on [facility]. POD# 2 right THA. Heparin drip resumed post-op on 10/27 pm. HL was therapeutic at 0.42 on 2400u/hr, however, AM HL came back at 0.27. Hgb 10.6, plts 225, CrCl 88 mL/min.   Follow up HL came back low at 0.22 on 2600 units/hr. Will give 2000 unit bolus and increase rate to 2800 units/hr.  Goal of Therapy:  Heparin level 0.3-0.7 units/ml Monitor platelets by anticoagulation protocol: Yes   Plan:  Heparin 2000 unit bolus Increase heparin to 2800 units/hr Daily HL and CBC  Sheppard CoilFrank Wilson PharmD., BCPS Clinical Pharmacist Pager [phone] 06/28/2015 9:27 PM

## 2015-06-28 NOTE — Progress Notes (Signed)
[facility] Progress Note Weekend Coverage  Subjective  Doing well R hip sore but ok  No SOB  Tolerating diet  Last BM was Monday (06/23/2015) + Flatus   ROS As above   Objective   BP 93/61 mmHg  Pulse 83  Temp(Src) 98 F (36.7 C) (Oral)  Resp 18  Ht _0  (1.803 m)  Wt 104.509 kg (230 lb 6.4 oz)  BMI 32.15 kg/m2  SpO2 96%  Intake/Output      10/28 0701 - 10/29 0700 10/29 0701 - 10/30 0700   P.O. 1070 240   I.V. (mL/kg) 606 (6.2) 120 (1.1)   IV Piggyback     Total Intake(mL/kg) 1676 (17.2) 360 (3.4)   Urine (mL/kg/hr) 775 (0.3) 275 (0.5)   Total Output 775 275   Net +901 +85          Labs  Results for Jonathan Stafford, Jonathan Stafford (MRN [id number]) as of 06/28/2015 12:44  Ref. Range 06/28/2015 02:42  Sodium Latest Ref Range: 135-145 mmol/L 134 (L)  Potassium Latest Ref Range: 3.5-5.1 mmol/L 4.2  Chloride Latest Ref Range: 101-111 mmol/L 98 (L)  CO2 Latest Ref Range: 22-32 mmol/L 26  BUN Latest Ref Range: 6-20 mg/dL 12  Creatinine Latest Ref Range: 0.61-1.24 mg/dL 1.03  Calcium Latest Ref Range: 8.9-10.3 mg/dL 8.1 (L)  EGFR (Non-African Amer.) Latest Ref Range: >60 mL/min >60  EGFR (African American) Latest Ref Range: >60 mL/min >60  Glucose Latest Ref Range: 65-99 mg/dL 123 (H)  Anion gap Latest Ref Range: 5-15  10  Alkaline Phosphatase Latest Ref Range: 38-126 U/L 28 (L)  Albumin Latest Ref Range: 3.5-5.0 g/dL 2.4 (L)  AST Latest Ref Range: 15-41 U/L 21  ALT Latest Ref Range: 17-63 U/L 5 (L)  Total Protein Latest Ref Range: 6.5-8.1 g/dL 5.6 (L)  Total Bilirubin Latest Ref Range: 0.3-1.2 mg/dL 0.7  WBC Latest Ref Range: 4.0-10.5 K/uL 11.2 (H)  RBC Latest Ref Range: 4.22-5.81 MIL/uL 3.30 (L)  Hemoglobin Latest Ref Range: 13.0-17.0 g/dL 10.6 (L)  HCT Latest Ref Range: 39.0-52.0 % 31.1 (L)  MCV Latest Ref Range: 78.0-100.0 fL 94.2  MCH Latest Ref Range: 26.0-34.0 pg 32.1  MCHC Latest Ref Range: 30.0-36.0 g/dL 34.1  RDW Latest Ref Range: 11.5-15.5  % 12.9  Platelets Latest Ref Range: 150-400 K/uL 225    Exam  Gen: resting comfortably, sitting in chair Lungs: clear anterior fields Cardiac: irregular  Abd: + BS, NT, mildly distended  Ext:       Right Lower Extremity   Dressing stable  Scant drainage noted  Swelling stable  Ext warm   + DP pulse  No DCT  Distal motor and sensory functions intact    Assessment and Plan   POD/HD#: 2   64 y/o male s/p ground level fall with R femoral neck fracture and B PE   1. Fall  2. R FNF s/p R hip hemiarthroplasty  WBAT  Posterior hip precautions  Dressing change tomorrow  PT/OT  Ice prn   3. B PE  Per medicine  4. FEN  Advance bowel regimen   5. Dispo  Ortho issues stable  Continue with therapies    Jari Pigg, PA-C [facility] [phone] (O) 06/28/2015 12:43 PM

## 2015-06-28 NOTE — Progress Notes (Signed)
[place] TEAM 1 - Stepdown/ICU TEAM PROGRESS NOTE  Jonathan Stafford HQI:[id number] DOB: 06/18/1951 DOA: 06/25/2015 PCP: No primary care provider on file.  Admit HPI / Brief Narrative: 64 year old male with HxDepression, SVT and Atrial fibrillation with indwelling pacemaker/defibrillator, CAD, COPD, CKD stage 2, and Parkinson's disease admitted to [facility] 05/2015 for an ablation. He was there for 3 days and almost immediately after being discharged reported "not feeling right". Since that time he experienced significant functional decline. SOB developed which greatly reduced his functional status. He had been unable to ambulate 10 feet without becoming markedly short of breath.  10/25 he suffered a syncopal episode.  He presented to [facility] ED. CTA of the chest noted large volume emboli throughout bilateral lungs. RV/LV ratio 1.09. He was hypoxemic requiring venti mask to keep sats in 90s. Also due to fall he suffered R hip fracture.   SIGNIFICANT EVENTS: 10/25 > presented to [facility] with syncope. PE diagnosed 10/26 > transfer to [facility] for possible EKOS 10/26 > transfer to SDU and to [facility]  HPI/Subjective: The patient is resting comfortably in bed.  He reports very little hip pain.  He denies chest pain shortness of breath fevers chills nausea or vomiting.  He has not had a bowel movement in many days.  Assessment/Plan:  Acute hypoxemic respiratory failure secondary to large volume acute pulmonary emboli Clinically stable at present - requiring only 2 L supplemental nasal cannula oxygen - continue anticoagulation   Bilateral submassive pulmonary emboli or DVT -provoked in setting of recent hospitalization  -Heparin per pharmacy - discussed choices for conversion to oral treatment to include benefits and disadvantages associated with each agent and patient desires a NOAC - will ask case management to investigate co-pay/cost of the new medications and transition when that  information is available -TTE - RV dysfunction moderate to severe   Rt hip fracture -s/p surgical correction - PT/OT to continue per Orthopedics   Chronic Atrial fibrillation s/p ablation Sept 2016 NSR presently - follow on tele - not previously on chronic anticoagulation  Syncope history of same, cardiogenic in past, suspect now secondary to PE  HTN BP currently well controlled  H/o CAD, defibrillator in-situ  HLD  CKD stage 2 Cr 1.3 on admission - renal function has now normalized  ?Cirrhosis noted on CTA chest LFTs normal - coags normal - acute hepatitis panel pending - further monitoring in outpt setting will be indicated    COPD without acute exacerbation Well compensated at present   SIRS, no obvious source of infection  Resolved   DM managed by diet A1c 5.9 - CBG well controlled  Parkinson's disease Cont home medical regimen   Depression  Code Status: FULL Family Communication: no family present at time of exam Disposition Plan: Stable for transfer to medical bed - continue PT/OT - ? CIR though patient highly motivated to DC home  Consultants: PCCM  Orthopedic Surgery   Procedures: 10/26 TTE EF 50% to 55% - grade 1 diastolic dysfunction - Right ventricle: Systolic function was moderately toseverely reduced 10/27 Venous dopplers - Subacute DVT noted in the left common femoral vein. Chronic vs indeterminate age DVT noted in the left popliteal vein. No DVT RLE. 10/27 R hip bipolar hemiarthroplasty   Antibiotics: none  DVT prophylaxis: IV heparin   Objective: Blood pressure 111/58, pulse 88, temperature 98.1 F (36.7 C), temperature source Oral, resp. rate 17, height  (1.803 m), weight 104.509 kg (230 lb 6.4 oz), SpO2  95 %.  Intake/Output Summary (Last 24 hours) at 06/28/15 1042 Last data filed at 06/28/15 1000  Gross per 24 hour  Intake   1526 ml  Output   1050 ml  Net    476 ml   Exam: General: No acute respiratory distress - alert and  conversant Lungs: Clear to auscultation bilaterally without wheezes/crackles Cardiovascular: Regular rate and rhythm without murmur gallop or rub  Abdomen: Nontender, nondistended, soft, bowel sounds positive, no rebound, no ascites, no appreciable mass Extremities: No significant cyanosis, clubbing, edema bilateral lower extremities  Data Reviewed: Basic Metabolic Panel:  Recent Labs Lab 06/25/15 0341 06/25/15 0805 06/26/15 0225 06/27/15 0400 06/28/15 0242  NA 137 136 133* 135 134*  K 4.1 4.3 3.8 3.6 4.2  CL 103 101 99* 100* 98*  CO2 26 27 26 23 26   GLUCOSE 140* 132* 139* 122* 123*  BUN 22* 21* 19 15 12   CREATININE 1.26* 1.28* 1.19 1.05 1.03  CALCIUM 8.5* 8.4* 8.0* 8.1* 8.1*  MG 1.9  --  2.2 2.0  --   PHOS 3.1  --  2.3*  --   --     CBC:  Recent Labs Lab 06/25/15 0805 06/26/15 0225 06/27/15 0400 06/28/15 0242  WBC 11.8* 12.2* 9.8 11.2*  NEUTROABS  --   --  5.5  --   HGB 13.6 12.7* 11.1* 10.6*  HCT 41.2 37.5* 33.3* 31.1*  MCV 95.8 94.9 93.3 94.2  PLT 201 199 199 225    Liver Function Tests:  Recent Labs Lab 06/25/15 0341 06/27/15 0400 06/28/15 0242  AST 22 21 21   ALT 22 13* 5*  ALKPHOS 32* 29* 28*  BILITOT 0.9 0.6 0.7  PROT 6.2* 5.7* 5.6*  ALBUMIN 3.3* 2.7* 2.4*   Coags:  Recent Labs Lab 06/26/15 1715  INR 1.24    Recent Labs Lab 06/26/15 1715  APTT 29    Cardiac Enzymes:  Recent Labs Lab 06/25/15 0341 06/25/15 0805 06/25/15 1410  TROPONINI 0.06* 0.06* 0.04*    CBG:  Recent Labs Lab 06/25/15 0227 06/25/15 0740  GLUCAP 127* 127*    Recent Results (from the past 240 hour(s))  MRSA PCR Screening     Status: None   Collection Time: 06/25/15  2:11 AM  Result Value Ref Range Status   MRSA by PCR NEGATIVE NEGATIVE Final    Comment:        The GeneXpert MRSA Assay (FDA approved for NASAL specimens only), is one component of a comprehensive MRSA colonization surveillance program. It is not intended to diagnose  MRSA infection nor to guide or monitor treatment for MRSA infections.      Studies:   Recent x-ray studies have been reviewed in detail by the Attending Physician  Scheduled Meds:  Scheduled Meds: . amiodarone  200 mg Oral Daily  . antiseptic oral rinse  7 mL Mouth Rinse BID  . aspirin  325 mg Oral Daily  . carbidopa-levodopa  2 tablet Oral TID  . carvedilol  6.25 mg Oral BID WC  . fenofibrate  160 mg Oral Daily  . mometasone-formoterol  2 puff Inhalation BID  . omega-3 acid ethyl esters  1 g Oral TID  . pantoprazole  40 mg Oral Daily  . simvastatin  20 mg Oral Daily  . spironolactone  25 mg Oral Daily  . tiotropium  18 mcg Inhalation Daily    Time spent on care of this patient: 35 mins   Rachael Zapanta T , MD   [facility] Office  [phone] Pager - Text Page per Loretha Stapler as per below:  On-Call/Text Page:      [email or web address]      password [email or web address]  If 7PM-7AM, please contact night-coverage [email or web address] Password [email or web address] 06/28/2015, 10:42 AM   LOS: 3 days

## 2015-06-28 NOTE — Progress Notes (Signed)
I will follow up Monday [facility] for a possible inpt rehab admission pendng their approval. SNF backup is advised with this payor. [phone]

## 2015-06-29 DIAGNOSIS — I2601 Septic pulmonary embolism with acute cor pulmonale: Secondary | ICD-10-CM

## 2015-06-29 DIAGNOSIS — D62 Acute posthemorrhagic anemia: Secondary | ICD-10-CM | POA: Diagnosis present

## 2015-06-29 DIAGNOSIS — R55 Syncope and collapse: Secondary | ICD-10-CM | POA: Diagnosis present

## 2015-06-29 DIAGNOSIS — G20 Parkinson's disease: Secondary | ICD-10-CM | POA: Diagnosis present

## 2015-06-29 DIAGNOSIS — I82412 Acute embolism and thrombosis of left femoral vein: Secondary | ICD-10-CM | POA: Diagnosis present

## 2015-06-29 LAB — HEPATITIS PANEL, ACUTE
HEP A IGM: NEGATIVE
HEP B C IGM: NEGATIVE
HEP B S AG: NEGATIVE

## 2015-06-29 LAB — CBC
HCT: 28.9 % — ABNORMAL LOW (ref 39.0–52.0)
Hemoglobin: 9.6 g/dL — ABNORMAL LOW (ref 13.0–17.0)
MCH: 31.1 pg (ref 26.0–34.0)
MCHC: 33.2 g/dL (ref 30.0–36.0)
MCV: 93.5 fL (ref 78.0–100.0)
PLATELETS: 265 10*3/uL (ref 150–400)
RBC: 3.09 MIL/uL — ABNORMAL LOW (ref 4.22–5.81)
RDW: 12.7 % (ref 11.5–15.5)
WBC: 11.3 10*3/uL — ABNORMAL HIGH (ref 4.0–10.5)

## 2015-06-29 LAB — HEPARIN LEVEL (UNFRACTIONATED): HEPARIN UNFRACTIONATED: 0.53 [IU]/mL (ref 0.30–0.70)

## 2015-06-29 NOTE — Progress Notes (Signed)
[facility] Progress Note Weekend Coverage  Subjective  Doing well R hip sore but tolerable  No SOB, chest pain, N/V, numbness or tingling in extremities  Tolerating diet  Still no BM despite bowel regimen  Last BM was Monday (06/23/2015)  + Flatus   ROS As above   Objective   BP 128/66 mmHg  Pulse 80  Temp(Src) 98.4 F (36.9 C) (Oral)  Resp 16  Ht 5\' 11"  (1.803 m)  Wt 106.6 kg (235 lb 0.2 oz)  BMI 32.79 kg/m2  SpO2 95%  Intake/Output      10/29 0701 - 10/30 0700 10/30 0701 - 10/31 0700   P.O. 240 240   I.V. (mL/kg) 368 (3.5)    Total Intake(mL/kg) 608 (5.7) 240 (2.3)   Urine (mL/kg/hr) 1275 (0.5)    Total Output 1275     Net -667 +240        Urine Occurrence 2 x      Labs  Results for [name], Jonathan Stafford (MRN [id number]) as of 06/29/2015 12:23  Ref. Range 06/29/2015 08:45  WBC Latest Ref Range: 4.0-10.5 K/uL 11.3 (H)  RBC Latest Ref Range: 4.22-5.81 MIL/uL 3.09 (L)  Hemoglobin Latest Ref Range: 13.0-17.0 g/dL 9.6 (L)  HCT Latest Ref Range: 39.0-52.0 % 28.9 (L)  MCV Latest Ref Range: 78.0-100.0 fL 93.5  MCH Latest Ref Range: 26.0-34.0 pg 31.1  MCHC Latest Ref Range: 30.0-36.0 g/dL 40.933.2  RDW Latest Ref Range: 11.5-15.5 % 12.7  Platelets Latest Ref Range: 150-400 K/uL 265     Exam  Gen: resting comfortably in bed Lungs: clear anterior fields Cardiac: irregular  Abd: + BS, NT, mildly distended  Ext:       Right Lower Extremity   Dressing stable, scant drainage  Swelling stable  Ext warm   + DP pulse  No DCT  Distal motor and sensory functions intact    Assessment and Plan   POD/HD#: 873   64 y/o male s/p ground level fall with R femoral neck fracture and Stafford PE   1. Fall  2. R FNF s/p R hip hemiarthroplasty  WBAT  Posterior hip precautions  Dressing changes as needed  PT/OT  Ice prn    Acute blood loss anemia   Appears asymptomatic    Monitor   CBC in am   3. Stafford PE  Per medicine  4. FEN  Continue bowel regimen    5. Dispo  Ortho issues stable  Continue with therapies    Mearl LatinKeith W. Noah Lembke, PA-C [facility] [phone](P) [phone] (O) 06/29/2015 12:22 PM

## 2015-06-29 NOTE — Progress Notes (Signed)
ANTICOAGULATION CONSULT NOTE - Follow Up Consult  Pharmacy Consult for heparin Indication: pulmonary embolus  No Known Allergies  Patient Measurements: Height: 5\' 11"  (180.3 cm) Weight: 235 lb 0.2 oz (106.6 kg) IBW/kg (Calculated) : 75.3 Heparin Dosing Weight: 94 kg  Vital Signs: Temp: 98.4 F (36.9 C) (10/30 0521) Temp Source: Oral (10/30 0521) BP: 128/66 mmHg (10/30 0521) Pulse Rate: 80 (10/30 0521)  Labs:  Recent Labs  06/26/15 1715  06/27/15 0400  06/28/15 0242 06/28/15 2019 06/29/15 0845  HGB  --   < > 11.1*  --  10.6*  --  9.6*  HCT  --   --  33.3*  --  31.1*  --  28.9*  PLT  --   --  199  --  225  --  265  APTT 29  --   --   --   --   --   --   LABPROT 15.7*  --   --   --   --   --   --   INR 1.24  --   --   --   --   --   --   HEPARINUNFRC  --   --  0.22*  < > 0.27* 0.22* 0.53  CREATININE  --   --  1.05  --  1.03  --   --   < > = values in this interval not displayed.  Estimated Creatinine Clearance: 88.8 mL/min (by C-G formula based on Cr of 1.03).  Assessment:  64 y/o M on IV heparin for submassive PE 06/24/15. Right hip fracture due to fall with syncopal event prior on 06/24/15. Found to have submassive PE. [facility]. POD# 3 right THA. Heparin drip resumed post-op on 10/27 pm. CrCl ~ 88 mL/min, hgb 9.6, plts 265. HL last PM subtherapeutic at 0.22. Pt was given a 2000u bolus and inc rate to 2800 units/hr. Now HL 0.53  Goal of Therapy:  Heparin level 0.3-0.7 units/ml Monitor platelets by anticoagulation protocol: Yes   Plan:  Continue heparin 2800 units/hr Daily HL and CBC Monitor for S&S of bleed  Sandi CarneNick Josphine Laffey, PharmD Pharmacy Resident Pager: [phone] 06/29/2015,11:46 AM

## 2015-06-29 NOTE — Progress Notes (Signed)
PROGRESS NOTE  Jonathan Stafford ZOX:[id number] DOB: 1951/01/20 DOA: 06/25/2015 PCP: No primary care provider on file.  Admit HPI / Brief Narrative: 64 year old male with HxDepression, SVT and Atrial fibrillation with indwelling pacemaker/defibrillator, CAD, COPD, CKD stage 2, and Parkinson's disease admitted to [facility] 05/2015 for an ablation. He was there for 3 days and almost immediately after being discharged reported "not feeling right". Since that time he experienced significant functional decline. SOB developed which greatly reduced his functional status. He had been unable to ambulate 10 feet without becoming markedly short of breath.  10/25 he suffered a syncopal episode.  He presented to [facility] ED. CTA of the chest noted large volume emboli throughout bilateral lungs. RV/LV ratio 1.09. He was hypoxemic requiring venti mask to keep sats in 90s. Also due to fall he suffered R hip fracture.   SIGNIFICANT EVENTS: 10/25 > presented to [facility] with syncope. PE diagnosed 10/26 > transfer to [facility] for possible EKOS 10/26 > transfer to SDU and to [facility]  HPI/Subjective: The patient is resting comfortably in bed.  He reports very little hip pain.  He denies chest pain shortness of breath fevers chills nausea or vomiting.  He has not had a bowel movement in many days.  Assessment/Plan:  Acute hypoxemic respiratory failure secondary to large volume acute pulmonary emboli Clinically stable at present - requiring only 2 L supplemental nasal cannula oxygen - continue anticoagulation   Bilateral submassive pulmonary emboli or DVT -Provoked in setting of recent hospitalization  -Heparin per pharmacy - discussed choices for conversion to oral treatment to include benefits and disadvantages associated with each agent and patient desires a NOAC - will ask case management to investigate co-pay/cost of the new medications and transition when that information is available -TTE - RV  dysfunction moderate to severe  Will switch to Eliquis today. Ask pharmacy for news adjustment.  Rt hip fracture -s/p surgical correction - PT/OT to continue per Orthopedics   Chronic Atrial fibrillation s/p ablation Sept 2016 NSR presently - follow on tele - not previously on chronic anticoagulation  Syncope history of same, cardiogenic in past, suspect now secondary to PE  HTN BP currently well controlled  H/o CAD, defibrillator in-situ  HLD  CKD stage 2 Cr 1.3 on admission - renal function has now normalized  ?Cirrhosis noted on CTA chest Normal LFTs and INR, negative acute hepatitis panel. Likely hepatic steatosis rather than hepatic cirrhosis. Probably safe for the NOACs.  COPD without acute exacerbation Well compensated at present   SIRS, no obvious source of infection  Resolved   DM managed by diet A1c 5.9 - CBG well controlled  Parkinson's disease Cont home medical regimen   Depression  Code Status: FULL Family Communication: no family present at time of exam Disposition Plan: Stable for transfer to medical bed - continue PT/OT - ? CIR though patient highly motivated to DC home  Consultants: PCCM  Orthopedic Surgery   Procedures: 10/26 TTE EF 50% to 55% - grade 1 diastolic dysfunction - Right ventricle: Systolic function was moderately toseverely reduced 10/27 Venous dopplers - Subacute DVT noted in the left common femoral vein. Chronic vs indeterminate age DVT noted in the left popliteal vein. No DVT RLE. 10/27 R hip bipolar hemiarthroplasty   Antibiotics: none  DVT prophylaxis: IV heparin   Objective: Blood pressure 128/66, pulse 80, temperature 98.4 F (36.9 C), temperature source Oral, resp. rate 16, height  (1.803 m), weight 106.6 kg (235 lb 0.2  oz), SpO2 95 %.  Intake/Output Summary (Last 24 hours) at 06/29/15 1300 Last data filed at 06/29/15 0900  Gross per 24 hour  Intake    464 ml  Output   1000 ml  Net   -536 ml    Exam: General: No acute respiratory distress - alert and conversant Lungs: Clear to auscultation bilaterally without wheezes/crackles Cardiovascular: Regular rate and rhythm without murmur gallop or rub  Abdomen: Nontender, nondistended, soft, bowel sounds positive, no rebound, no ascites, no appreciable mass Extremities: No significant cyanosis, clubbing, edema bilateral lower extremities  Data Reviewed: Basic Metabolic Panel:  Recent Labs Lab 06/25/15 0341 06/25/15 0805 06/26/15 0225 06/27/15 0400 06/28/15 0242  NA 137 136 133* 135 134*  K 4.1 4.3 3.8 3.6 4.2  CL 103 101 99* 100* 98*  CO2 GLUCOSE 140* 132* 139* 122* 123*  BUN 22* 21* CREATININE 1.26* 1.28* 1.19 1.05 1.03  CALCIUM 8.5* 8.4* 8.0* 8.1* 8.1*  MG 1.9  --  2.2 2.0  --   PHOS 3.1  --  2.3*  --   --     CBC:  Recent Labs Lab 06/25/15 0805 06/26/15 0225 06/27/15 0400 06/28/15 0242 06/29/15 0845  WBC 11.8* 12.2* 9.8 11.2* 11.3*  NEUTROABS  --   --  5.5  --   --   HGB 13.6 12.7* 11.1* 10.6* 9.6*  HCT 41.2 37.5* 33.3* 31.1* 28.9*  MCV 95.8 94.9 93.3 94.2 93.5  PLT 201 199 199 225 265    Liver Function Tests:  Recent Labs Lab 06/25/15 0341 06/27/15 0400 06/28/15 0242  AST ALT 22 13* 5*  ALKPHOS 32* 29* 28*  BILITOT 0.9 0.6 0.7  PROT 6.2* 5.7* 5.6*  ALBUMIN 3.3* 2.7* 2.4*   Coags:  Recent Labs Lab 06/26/15 1715  INR 1.24    Recent Labs Lab 06/26/15 1715  APTT 29    Cardiac Enzymes:  Recent Labs Lab 06/25/15 0341 06/25/15 0805 06/25/15 1410  TROPONINI 0.06* 0.06* 0.04*    CBG:  Recent Labs Lab 06/25/15 0227 06/25/15 0740  GLUCAP 127* 127*    Recent Results (from the past 240 hour(s))  MRSA PCR Screening     Status: None   Collection Time: 06/25/15  2:11 AM  Result Value Ref Range Status   MRSA by PCR NEGATIVE NEGATIVE Final    Comment:        The GeneXpert MRSA Assay (FDA approved for NASAL specimens only), is one component  of a comprehensive MRSA colonization surveillance program. It is not intended to diagnose MRSA infection nor to guide or monitor treatment for MRSA infections.      Studies:   Recent x-ray studies have been reviewed in detail by the Attending Physician  Scheduled Meds:  Scheduled Meds: . amiodarone  200 mg Oral Daily  . antiseptic oral rinse  7 mL Mouth Rinse BID  . aspirin  325 mg Oral Daily  . carbidopa-levodopa  2 tablet Oral TID  . carvedilol  6.25 mg Oral BID WC  . fenofibrate  160 mg Oral Daily  . mometasone-formoterol  2 puff Inhalation BID  . omega-3 acid ethyl esters  1 g Oral TID  . pantoprazole  40 mg Oral Daily  . polyethylene glycol  17 g Oral BID  . ranolazine  500 mg Oral BID  . senna-docusate  1 tablet Oral BID  . simvastatin  20 mg Oral Daily  .  spironolactone  25 mg Oral Daily  . tiotropium  18 mcg Inhalation Daily  . Vortioxetine HBr  20 mg Oral Daily    Time spent on care of this patient: 35 mins   Clint LippsELMAHI,Kayelynn Abdou A , MD   [facility] Office  [phone] Pager - Text Page per Loretha StaplerAmion as per below:  On-Call/Text Page:      [email or web address]      password [email or web address]  If 7PM-7AM, please contact night-coverage [email or web address] Password [email or web address] 06/29/2015, 1:00 PM   LOS: 4 days

## 2015-06-30 DIAGNOSIS — R55 Syncope and collapse: Secondary | ICD-10-CM

## 2015-06-30 DIAGNOSIS — I82412 Acute embolism and thrombosis of left femoral vein: Secondary | ICD-10-CM

## 2015-06-30 DIAGNOSIS — G20 Parkinson's disease: Secondary | ICD-10-CM

## 2015-06-30 LAB — CBC
HEMATOCRIT: 28.6 % — AB (ref 39.0–52.0)
HEMOGLOBIN: 9.4 g/dL — AB (ref 13.0–17.0)
MCH: 30.9 pg (ref 26.0–34.0)
MCHC: 32.9 g/dL (ref 30.0–36.0)
MCV: 94.1 fL (ref 78.0–100.0)
Platelets: 329 10*3/uL (ref 150–400)
RBC: 3.04 MIL/uL — AB (ref 4.22–5.81)
RDW: 12.9 % (ref 11.5–15.5)
WBC: 11.8 10*3/uL — ABNORMAL HIGH (ref 4.0–10.5)

## 2015-06-30 LAB — FACTOR 5 LEIDEN

## 2015-06-30 LAB — PROTHROMBIN GENE MUTATION

## 2015-06-30 LAB — HEPARIN LEVEL (UNFRACTIONATED): HEPARIN UNFRACTIONATED: 0.33 [IU]/mL (ref 0.30–0.70)

## 2015-06-30 MED ORDER — ASPIRIN 81 MG PO TBEC: 81.0000 mg | DELAYED_RELEASE_TABLET | Freq: Every day | ORAL | Status: AC

## 2015-06-30 MED ORDER — APIXABAN 5 MG PO TABS
10.0000 mg | ORAL_TABLET | Freq: Two times a day (BID) | ORAL | Status: DC
  Administered 2015-06-30 – 2015-07-01 (×3): 10 mg via ORAL
  Filled 2015-06-30 (×3): qty 2

## 2015-06-30 MED ORDER — APIXABAN 5 MG PO TABS: 5.0000 mg | ORAL_TABLET | Freq: Two times a day (BID) | ORAL | Status: DC

## 2015-06-30 MED ORDER — ASPIRIN 81 MG PO TBEC: 325.0000 mg | DELAYED_RELEASE_TABLET | Freq: Every day | ORAL | Status: DC

## 2015-06-30 MED ORDER — APIXABAN 5 MG PO TABS: ORAL_TABLET | ORAL | Status: AC

## 2015-06-30 NOTE — Progress Notes (Signed)
     Subjective:  POD#4 R hip hemiarthroplasty. Patient reports pain as mild.  Resting comfortably in bed this morning.  Still feeling constipated but passing gas. Patient expresses desire to go home at discharge.  He will have one flight of stairs to climb to enter his residence.  Will have PT work with him today on stairs.   Objective:   VITALS:   Filed Vitals:   06/29/15 1500 06/29/15 1950 06/29/15 1956 06/30/15 0536  BP: 126/63  114/61 102/63  Pulse: 78  72 72  Temp: 98.3 F (36.8 C)  98.7 F (37.1 C) 98.3 F (36.8 C)  TempSrc: Oral  Oral Oral  Resp: 18  19 19   Height:      Weight:      SpO2: 98% 97% 99% 98%    Neurologically intact ABD soft Neurovascular intact Sensation intact distally Intact pulses distally Dorsiflexion/Plantar flexion intact Incision: scant drainage   Lab Results  Component Value Date   WBC 11.8* 06/30/2015   HGB 9.4* 06/30/2015   HCT 28.6* 06/30/2015   MCV 94.1 06/30/2015   PLT 329 06/30/2015   BMET    Component Value Date/Time   NA 134* 06/28/2015 0242   K 4.2 06/28/2015 0242   CL 98* 06/28/2015 0242   CO2 26 06/28/2015 0242   GLUCOSE 123* 06/28/2015 0242   BUN 12 06/28/2015 0242   CREATININE 1.03 06/28/2015 0242   CALCIUM 8.1* 06/28/2015 0242   GFRNONAA >60 06/28/2015 0242   GFRAA >60 06/28/2015 0242     Assessment/Plan: 4 Days Post-Op   Principal Problem:   Pulmonary embolism (HCC) Active Problems:   Fracture of femoral neck, right (HCC)   Acute blood loss anemia   Syncope and collapse   Parkinson's disease (HCC)   Acute deep vein thrombosis (DVT) of femoral vein of left lower extremity (HCC)   Up with therapy WBAT in the RLE with posterior hip precautions for 6 weeks Heparin drip for dvt prophylaxis for now but medicine plans to switch to orals Will order a soap suds enema to help with constipation OK from ortho standpoint to discharge home when ready from medicine standpoint.  Will continue to follow on  outpatient basis.     Khristen Cheyney Hilda LiasMarie 06/30/2015, 6:33 AM Cell [phone]

## 2015-06-30 NOTE — Progress Notes (Signed)
Occupational Therapy Treatment Patient Details Name: Jonathan Stafford MRN: [id number] DOB: October 16, 1950 Today's Date: 06/30/2015    History of present illness 64 year old male with PMH of cardiovascular disease including tachyarrhythmia. Has had progressive SOB over the past few weeks. Syncopal episode 10/25 with Mildly displaced, non-comminuted, fracture of R femoral neck . Found to have large PE with significant clot burden and increased RV/LV ratio. [facility] EKOS. S/P HEMIARTHROPLASTY 06/26/15   OT comments  Pt states that he plans to d/c home - not planning on SNF rehab. [facility] and 24/7 supervision as well as a/e secondary to posterior hip precautions. Currently Min guard assist for functional mobility and transfers and Min guard LB ADL's using a/e w/ Min vc's for hip precautions. Pt states that he has tub bench at home & plans to sponge bathe initially.   Follow Up Recommendations  Home health OT;Supervision/Assistance - 24 hour    Equipment Recommendations  Other (comment) (A/E recomended )    Recommendations for Other Services      Precautions / Restrictions Precautions Precautions: Posterior Hip Precaution Booklet Issued: Yes (comment) Precaution Comments: Pt educated on posterior hip precautions with good recall. Restrictions Weight Bearing Restrictions: Yes RLE Weight Bearing: Weight bearing as tolerated       Mobility Bed Mobility               General bed mobility comments: Pt up in recliner   Transfers Overall transfer level: Needs assistance Equipment used: Rolling walker (2 wheeled) Transfers: Sit to/from UGI CorporationStand;Stand Pivot Transfers Sit to Stand: Min guard Stand pivot transfers: Min guard            Balance Overall balance assessment: Needs assistance Sitting-balance support: No upper extremity supported;Feet supported Sitting balance-Leahy Scale: Good     Standing balance support: Bilateral upper  extremity supported Standing balance-Leahy Scale: Fair                     ADL Overall ADL's : Needs assistance/impaired     Grooming: Wash/dry hands;Wash/dry face;Standing;Supervision/safety;Min guard               Lower Body Dressing: Min guard;Supervision/safety;Sit to/from stand;Adhering to hip precautions;With adaptive equipment   Toilet Transfer: Min guard;RW;Ambulation;Comfort height toilet   Toileting- ArchitectClothing Manipulation and Hygiene: Min guard;Sit to/from stand;Adhering to hip precautions   Tub/ Shower Transfer: Tub transfer;Minimal assistance;Adhering to hip precautions (Reviewed tub transfer verbally as pt states that he has tub bench at home but currently plans to sponge bathe after d/c)   Functional mobility during ADLs: Supervision/safety;Min guard;Rolling walker General ADL Comments: Pt participated in ADL retraining session today with focus on transfers, grooming standing at sink and LB dressing using a/e. Pt should benefit from A/E and this was reviewed with him secondary to posterior hip precautions. Reviewed 3/3 total hip precautions with pt and min vc's for adhering to these during functional activities related to ADL's/transfers today.      Vision  No change from baseline                   Perception     Praxis      Cognition   Behavior During Therapy: [facility] for tasks assessed/performed Overall Cognitive Status: Within Functional Limits for tasks assessed                       Extremity/Trunk Assessment  Exercises     Shoulder Instructions       General Comments      Pertinent Vitals/ Pain       Pain Assessment: 0-10 Pain Score: 3  Pain Location: RLE Pain Descriptors / Indicators: Sore;Operative site guarding Pain Intervention(s): Limited activity within patient's tolerance;Premedicated before session;Monitored during session;Repositioned  Home Living                                           Prior Functioning/Environment              Frequency Min 2X/week     Progress Toward Goals  OT Goals(current goals can now be found in the care plan section)  Progress towards OT goals: Progressing toward goals  Acute Rehab OT Goals Patient Stated Goal: to not go to SNF - to go home with intermittent family assist  Plan Discharge plan needs to be updated    Co-evaluation                 End of Session Equipment Utilized During Treatment: Gait belt;Rolling walker   Activity Tolerance Patient tolerated treatment well   Patient Left in chair;with call bell/phone within reach   Nurse Communication          Time: 2956-2130 OT Time Calculation (min): 24 min  Charges: OT General Charges $OT Visit: 1 Procedure OT Treatments $Self Care/Home Management : 23-37 mins  Barnhill, Amy Beth Dixon, OTR/L 06/30/2015, 9:46 AM

## 2015-06-30 NOTE — Progress Notes (Signed)
I met with pt at bedside. He prefers to go directly home if possible. His sister and nephew live next door and are unemployed. He needs to practice stairs with therapy today. I will follow up later today. [phone]

## 2015-06-30 NOTE — Progress Notes (Signed)
ANTICOAGULATION CONSULT NOTE - Follow Up Consult  Pharmacy Consult for Heparin -> Elquis Indication: pulmonary embolus  No Known Allergies  Patient Measurements: Height: 5\' 11"  (180.3 cm) Weight: 235 lb 0.2 oz (106.6 kg) IBW/kg (Calculated) : 75.3 Heparin Dosing Weight: 94 kg  Vital Signs: Temp: 98.3 F (36.8 C) (10/31 0536) Temp Source: Oral (10/31 0536) BP: 102/63 mmHg (10/31 0536) Pulse Rate: 72 (10/31 0536)  Labs:  Recent Labs  06/28/15 0242 06/28/15 2019 06/29/15 0845 06/30/15 0332  HGB 10.6*  --  9.6* 9.4*  HCT 31.1*  --  28.9* 28.6*  PLT 225  --  265 329  HEPARINUNFRC 0.27* 0.22* 0.53 0.33  CREATININE 1.03  --   --   --     Estimated Creatinine Clearance: 88.8 mL/min (by C-G formula based on Cr of 1.03).  Assessment:   On IV heparin for submassive PE 06/24/15.    Right hip fracture due to fall with syncopal event prior on 06/24/15. Found to have submassive PE. [facility].     POD#4  right THA.   Heparin drip resumed post-op on 10/27 pm.   Heparin level is therapeutic (0.33) on 2800 units/hr.   To transition to Eliquis today.   On Aspirin 325 mg daily as prior to admission.  Goal of Therapy:  Heparin level 0.3-0.7 units/ml appropriate Eliquis dose for indication Monitor platelets by anticoagulation protocol: Yes   Plan:   Eliquis 10 mg po BID x 7 days, then 5 mg BID  Heparin to stop when giving first Eliquis dose.  Decrease Aspirin from 325 to 81 mg daily?  Dennie FettersEgan, Evaleen Sant Donovan, RPh Pager: [phone] 06/30/2015,9:20 AM

## 2015-06-30 NOTE — Progress Notes (Signed)
Physical Therapy Treatment Patient Details Name: Jonathan Stafford MRN: [id number] DOB: 1951/08/20 Today's Date: 06/30/2015    History of Present Illness 64 year old male with PMH of cardiovascular disease including tachyarrhythmia. Has had progressive SOB over the past few weeks. Syncopal episode 10/25 with Mildly displaced, non-comminuted, fracture of R femoral neck . Found to have large PE with significant clot burden and increased RV/LV ratio. Transferred to [facility] for possible EKOS. S/P HEMIARTHROPLASTY 06/26/15    PT Comments    Pt able to gait train ~70 feet, pt c/o shortness of breath, O2 sats were 90. Pt stated he will be going to a SNF once discharged. PT will perform stair/curb training tomorrow.   Follow Up Recommendations  SNF           Precautions / Restrictions Precautions Precautions: Posterior Hip Precaution Booklet Issued: Yes (comment) Precaution Comments: Pt able to recall precautions, sheet with precautions given for future reference. Restrictions Weight Bearing Restrictions: Yes RLE Weight Bearing: Weight bearing as tolerated    Mobility  Bed Mobility Overal bed mobility: Modified Independent Bed Mobility: Supine to Sit (HOB elevated)     Supine to sit: Modified independent (Device/Increase time)     General bed mobility comments: Pt able to sit EOB requiring modified assistance from bedrails.   Transfers Overall transfer level: Needs assistance Equipment used: Rolling walker (2 wheeled) Transfers: Sit to/from Stand Sit to Stand: Min guard Stand pivot transfers: Min guard       General transfer comment: Pt required min guard assistance to increase safety and decrease fast descent to recliner. Pt educated on sticking RLE out when sitting down to avoid bending hip past 90 degrees.  Ambulation/Gait Ambulation/Gait assistance: Min guard Ambulation Distance (Feet): 70 Feet Assistive device: Rolling walker (2 wheeled) Gait Pattern/deviations:  Decreased stance time - right;Step-through pattern;Decreased weight shift to right;Antalgic;Trunk flexed   Gait velocity interpretation: <1.8 ft/sec, indicative of risk for recurrent falls General Gait Details: Pt displayed signs of antalgic gait. Pt stated it was slightly painful to bear weight through RLE.                        Cognition Arousal/Alertness: Awake/alert Behavior During Therapy: WFL for tasks assessed/performed Overall Cognitive Status: Within Functional Limits for tasks assessed                      Exercises Total Joint Exercises Ankle Circles/Pumps: AROM;Both;20 reps;Seated Quad Sets: Strengthening;Right;15 reps;Seated (5 second hold) Heel Slides: Strengthening;Right;10 reps;Seated (reclined back of recliner to avoid bending hip past 90) Long Arc Quad: Strengthening;Both;10 reps;Seated (5 second hold)    General Comments General comments (skin integrity, edema, etc.): Pt able to follow hip precautions during treatment.       Pertinent Vitals/Pain Pain Assessment: No/denies pain Pain Score: 0-No pain           PT Goals (current goals can now be found in the care plan section) Progress towards PT goals: Progressing toward goals    Frequency  Min 4X/week    PT Plan Current plan remains appropriate       End of Session Equipment Utilized During Treatment: Gait belt Activity Tolerance: Patient tolerated treatment well Patient left: in chair;with call bell/phone within reach     Time: 4098-1191 PT Time Calculation (min) (ACUTE ONLY): 21 min  Charges:  1 Gait  Laurena SlimmerKayli Zamari Stafford, VirginiaPTA [phone] OFFICE  06/30/2015, 4:27 PM

## 2015-06-30 NOTE — Discharge Instructions (Signed)
INSTRUCTIONS o Remove items at home which could result in a fall. This includes throw rugs or furniture in walking pathways o ICE to the affected joint every three hours while awake for 30 minutes at a time, for at least the first 3-5 days, and then as needed for pain and swelling.  Continue to use ice for pain and swelling. You may notice swelling that will progress down to the foot and ankle.  This is normal after surgery.  Elevate your leg when you are not up walking on it.   o Continue to use the breathing machine you got in the hospital (incentive spirometer) which will help keep your temperature down.  It is common for your temperature to cycle up and down following surgery, especially at night when you are not up moving around and exerting yourself.  The breathing machine keeps your lungs expanded and your temperature down.   DIET:  As you were doing prior to hospitalization, we recommend a well-balanced diet.  DRESSING / WOUND CARE / SHOWERING  Keep the surgical dressing until follow up.  IF THE DRESSING FALLS OFF or the wound gets wet inside, change the dressing with sterile gauze.  Please use good hand washing techniques before changing the dressing.  Do not use any lotions or creams on the incision until instructed by your surgeon.    ACTIVITY  o Increase activity slowly as tolerated, but follow the weight bearing instructions below.   o No driving for 6 weeks or until further direction given by your physician.  You cannot drive while taking narcotics.  o No lifting or carrying greater than 10 lbs. until further directed by your surgeon. o Avoid periods of inactivity such as sitting longer than an hour when not asleep. This helps prevent blood clots.  o You may return to work once you are authorized by your doctor.     WEIGHT BEARING   Weight bearing as tolerated with assist device (walker, cane, etc) as directed, use it as long as suggested by your surgeon or therapist, typically  at least 4-6 weeks. POSTERIOR HIP PRECAUTIONS FOR 6WKS   CONSTIPATION  Constipation is defined medically as fewer than three stools per week and severe constipation as less than one stool per week.  Even if you have a regular bowel pattern at home, your normal regimen is likely to be disrupted due to multiple reasons following surgery.  Combination of anesthesia, postoperative narcotics, change in appetite and fluid intake all can affect your bowels.   YOU MUST use at least one of the following options; they are listed in order of increasing strength to get the job done.  They are all available over the counter, and you may need to use some, POSSIBLY even all of these options:    Drink plenty of fluids (prune juice may be helpful) and high fiber foods Colace 100 mg by mouth twice a day  Senokot for constipation as directed and as needed Dulcolax (bisacodyl), take with full glass of water  Miralax (polyethylene glycol) once or twice a day as needed.  If you have tried all these things and are unable to have a bowel movement in the first 3-4 days after surgery call either your surgeon or your primary doctor.    If you experience loose stools or diarrhea, hold the medications until you stool forms back up.  If your symptoms do not get better within 1 week or if they get worse, check with your doctor.  If you experience "the worst abdominal pain ever" or develop nausea or vomiting, please contact the office immediately for further recommendations for treatment.   ITCHING:  If you experience itching with your medications, try taking only a single pain pill, or even half a pain pill at a time.  You can also use Benadryl over the counter for itching or also to help with sleep.   TED HOSE STOCKINGS:  Use stockings on both legs until for at least 2 weeks or as directed by physician office. They may be removed at night for sleeping.  MEDICATIONS:  See your medication summary on the After Visit Summary  that nursing will review with you.  You may have some home medications which will be placed on hold until you complete the course of blood thinner medication.  It is important for you to complete the blood thinner medication as prescribed.  PRECAUTIONS:  If you experience chest pain or shortness of breath - call 911 immediately for transfer to the hospital emergency department.   If you develop a fever greater that 101 F, purulent drainage from wound, increased redness or drainage from wound, foul odor from the wound/dressing, or calf pain - CONTACT YOUR SURGEON.                                                   FOLLOW-UP APPOINTMENTS:  If you do not already have a post-op appointment, please call the office for an appointment to be seen by your surgeon.  Guidelines for how soon to be seen are listed in your After Visit Summary, but are typically between 1-4 weeks after surgery.  MAKE SURE YOU:   Understand these instructions.   Get help right away if you are not doing well or get worse.    Thank you for letting us be a part of your medical care team.  It is a privilege we respect greatly.  We hope these instructions will help you stay on track for a fast and full recovery!   ----------------------------------------------------------------------------------------------------------------------------------------  Information on my medicine - ELIQUIS (apixaban)  This medication education was reviewed with me or my healthcare representative as part of my discharge preparation.  The pharmacist that spoke with me during my hospital stay was:  Dennie Fettersgan, Charvi Gammage Donovan, [facility]  Why was Eliquis prescribed for you? Eliquis was prescribed to treat blood clots that may have been found in the veins of your legs (deep vein thrombosis) or in your lungs (pulmonary embolism) and to reduce the risk of them occurring again.  What do You need to know about Eliquis ? The starting dose is 10 mg (two 5 mg  tablets) taken TWICE daily for the FIRST SEVEN (7) DAYS, then on (enter date)  07/07/15  the dose is reduced to ONE 5 mg tablet taken TWICE daily.  Eliquis may be taken with or without food.   Try to take the dose about the same time in the morning and in the evening. If you have difficulty swallowing the tablet whole please discuss with your pharmacist how to take the medication safely.  Take Eliquis exactly as prescribed and DO NOT stop taking Eliquis without talking to the doctor who prescribed the medication.  Stopping may increase your risk of developing a new blood clot.  Refill your prescription before you run out.  After discharge,  you should have regular check-up appointments with your healthcare provider that is prescribing your Eliquis.    What do you do if you miss a dose? If a dose of ELIQUIS is not taken at the scheduled time, take it as soon as possible on the same day and twice-daily administration should be resumed. The dose should not be doubled to make up for a missed dose.  Important Safety Information A possible side effect of Eliquis is bleeding. You should call your healthcare provider right away if you experience any of the following: ? Bleeding from an injury or your nose that does not stop. ? Unusual colored urine (red or dark brown) or unusual colored stools (red or black). ? Unusual bruising for unknown reasons. ? A serious fall or if you hit your head (even if there is no bleeding).  Some medicines may interact with Eliquis and might increase your risk of bleeding or clotting while on Eliquis. To help avoid this, consult your healthcare provider or pharmacist prior to using any new prescription or non-prescription medications, including herbals, vitamins, non-steroidal anti-inflammatory drugs (NSAIDs) and supplements.  This website has more information on Eliquis (apixaban): http://www.eliquis.com/eliquis/home

## 2015-06-30 NOTE — Care Management Important Message (Signed)
Important Message  Patient Details  Name: Jonathan Stafford MRN: [id number] Date of Birth: 03-29-1951   Medicare Important Message Given:  Yes-second notification given    Kyla BalzarineShealy, Drury Ardizzone [place]ena 06/30/2015, 5:42 PM

## 2015-06-30 NOTE — Discharge Summary (Addendum)
Physician Discharge Summary  Jonathan Stafford JXB:[id number] DOB: 1950/10/13 DOA: 06/25/2015  PCP: No primary care provider on file.  Admit date: 06/25/2015 Discharge date: 06/30/2015  Time spent: 40 minutes  Recommendations for Outpatient Follow-up:  1. Follow up with the nursing home M.D. 2. Eliquis prescribed for PE, take 2 mg by mouth twice a day for total 14 days, then continue 5 mg by mouth twice a day for at least 6 months.  Discharge Diagnoses:  Principal Problem:   Pulmonary embolism (HCC) Active Problems:   Fracture of femoral neck, right (HCC)   Acute blood loss anemia   Syncope and collapse   Parkinson's disease (HCC)   Acute deep vein thrombosis (DVT) of femoral vein of left lower extremity [facility])   Discharge Condition: Stable  Diet recommendation: Heart healthy  Filed Weights   06/27/15 0900 06/28/15 0816 06/29/15 0500  Weight: 97.5 kg (214 lb 15.2 oz) 104.509 kg (230 lb 6.4 oz) 106.6 kg (235 lb 0.2 oz)    History of present illness:  64 year old male with PMH as below, which includes SVT and Atrial fibrillation with indwelling pacemaker/defibrillator, CAD, COPD, CKD2, and parkinson's disease. He was recently admitted to [facility] 05/2015 for ablation. He was there for 3 days, and almost immediately after being discharged he reports "not feeling right". Since that time his has had significant functional decline. For the past week or two SOB has developed, which greatly reduced his functional status. He has been unable to even leave the house, and more recently (last couple of days) he had been unable to ambulate 10 feet without becoming markedly short of breath. He denies associated chest pain, fevers, chills, or cough. 10/25 he suffered a syncopal episode. He denies prodrome, and states that he just fell out out of nowhere. He did lose consciousness. He does not recall hitting his head during fall. He presented to [facility] ED after syncope. CTA of the chest  was performed and found Large volume emboli throughout bilateral lungs. RV/LV ratio 1.09. He was hypoxemic requiring venti mask to keep sats in 90s. Also due to fall he suffered R hip fracture.  Hospital Course:   Acute hypoxemic respiratory failure secondary to large volume acute pulmonary emboli Clinically stable at present - requiring only 2 L supplemental nasal cannula oxygen - continue anticoagulation   Bilateral submassive pulmonary emboli or DVT -Provoked in setting of recent hospitalization  -TTE - RV dysfunction moderate to severe  -This is treated initially with heparin for 5 conservative days. -Started on Eliquis on discharge, suggest to take for 6 month.  Rt hip fracture -Secondary to fall which is likely secondary to the submassive PE. -Status post hip replacement. PT/OT to continue per Orthopedics  -Discharged to nursing home, patient is on Eliquis for treatment of PE, covers for DVT prophylaxis.  Chronic Atrial fibrillation s/p ablation Sept 2016 NSR presently,  not previously on chronic anticoagulation  Syncope history of same, cardiogenic in past, suspect now secondary to PE  HTN BP currently well controlled  H/o CAD, defibrillator in-situ  HLD  CKD stage 2 Cr 1.3 on admission - renal function has now normalized  ?Cirrhosis noted on CTA chest Normal LFTs and INR, negative acute hepatitis panel. Likely hepatic steatosis rather than hepatic cirrhosis. Probably safe for the NOACs.  COPD without acute exacerbation Well compensated at present   SIRS, no obvious source of infection  Resolved   DM managed by diet A1c 5.9 - CBG well controlled  Parkinson's disease Cont home medical regimen    Procedures:  Right hip hemiarthroplasty done by Dr. Wandra Feinstein. Murphy on 06/26/2015  Consultations:  Orthopedics surgery.  Patient was under PCCM, till 10/27.  Discharge Exam: Filed Vitals:   06/30/15 1300  BP: 114/57  Pulse: 74  Temp: 98.2 F (36.8 C)   Resp: 20   General: Alert and awake, oriented x3, not in any acute distress. HEENT: anicteric sclera, pupils reactive to light and accommodation, EOMI CVS: S1-S2 clear, no murmur rubs or gallops Chest: clear to auscultation bilaterally, no wheezing, rales or rhonchi Abdomen: soft nontender, nondistended, normal bowel sounds, no organomegaly Extremities: no cyanosis, clubbing or edema noted bilaterally Neuro: Cranial nerves II-XII intact, no focal neurological deficits  Discharge Instructions   Discharge Instructions    Diet - low sodium heart healthy    Complete by:  As directed      Increase activity slowly    Complete by:  As directed      Posterior total hip precautions    Complete by:  As directed      Weight bearing as tolerated    Complete by:  As directed   Laterality:  right  Extremity:  Lower          Current Discharge Medication List    START taking these medications   Details  apixaban (ELIQUIS) 5 MG TABS tablet Take 2 tablets total of 10 mg by mouth twice a day for 14 days, then switch to 5 mg twice a day    HYDROcodone-acetaminophen (NORCO) 5-325 MG tablet Take 1-2 tablets by mouth every 6 (six) hours as needed for moderate pain. Qty: 90 tablet, Refills: 0    methocarbamol (ROBAXIN) 500 MG tablet Take 1 tablet (500 mg total) by mouth 4 (four) times daily. Qty: 60 tablet, Refills: 0      CONTINUE these medications which have CHANGED   Details  aspirin 81 MG EC tablet Take 1 tablet (81 mg total) by mouth daily. Qty: 30 tablet      CONTINUE these medications which have NOT CHANGED   Details  amiodarone (PACERONE) 200 MG tablet Take 200 mg by mouth daily.    carbidopa-levodopa (SINEMET CR) 50-200 MG tablet Take 2 tablets by mouth 3 (three) times daily.     carvedilol (COREG) 6.25 MG tablet Take 6.25 mg by mouth 2 (two) times daily with a meal.    fenofibrate (TRICOR) 145 MG tablet Take 145 mg by mouth daily.    Fluticasone-Salmeterol (ADVAIR) 250-50  MCG/DOSE AEPB Inhale 1 puff into the lungs 2 (two) times daily.    omega-3 acid ethyl esters (LOVAZA) 1 G capsule Take 1 g by mouth 3 (three) times daily.    pantoprazole (PROTONIX) 40 MG tablet Take 40 mg by mouth daily.    ranolazine (RANEXA) 500 MG 12 hr tablet Take 500 mg by mouth 2 (two) times daily.    simvastatin (ZOCOR) 20 MG tablet Take 20 mg by mouth daily.    spironolactone (ALDACTONE) 25 MG tablet Take 25 mg by mouth daily.    sucralfate (CARAFATE) 1 G tablet Take 1 g by mouth 4 (four) times daily.    tiotropium (SPIRIVA) 18 MCG inhalation capsule Place 18 mcg into inhaler and inhale daily.    Vortioxetine HBr (BRINTELLIX) 20 MG TABS Take 20 mg by mouth daily.    zolpidem (AMBIEN) 5 MG tablet Take 5-10 mg by mouth at bedtime as needed for sleep.       No  Known Allergies Follow-up Information    Follow up with MURPHY, TIMOTHY D, MD In 10 days.   Specialty:  Orthopedic Surgery   Contact information:   [address] [place] [address] [phone]        The results of significant diagnostics from this hospitalization (including imaging, microbiology, ancillary and laboratory) are listed below for reference.    Significant Diagnostic Studies: Pelvis Portable  06/26/2015  CLINICAL DATA:  Postop right hip arthroplasty. EXAM: PORTABLE PELVIS 1-2 VIEWS COMPARISON:  06/24/2015 FINDINGS: Patient has undergone right hip hemiarthroplasty. The femoral head component appears seated within the acetabulum on this frontal view. No acute fractures are identified. IMPRESSION: Status post hemi arthroplasty.  No adverse features. Electronically Signed   By: Norva Pavlov M.D.   On: 06/26/2015 20:23    Microbiology: Recent Results (from the past 240 hour(s))  MRSA PCR Screening     Status: None   Collection Time: 06/25/15  2:11 AM  Result Value Ref Range Status   MRSA by PCR NEGATIVE NEGATIVE Final    Comment:        The GeneXpert MRSA Assay  (FDA approved for NASAL specimens only), is one component of a comprehensive MRSA colonization surveillance program. It is not intended to diagnose MRSA infection nor to guide or monitor treatment for MRSA infections.      Labs: Basic Metabolic Panel:  Recent Labs Lab 06/25/15 0341 06/25/15 0805 06/26/15 0225 06/27/15 0400 06/28/15 0242  NA 137 136 133* 135 134*  K 4.1 4.3 3.8 3.6 4.2  CL 103 101 99* 100* 98*  CO2 GLUCOSE 140* 132* 139* 122* 123*  BUN 22* 21* CREATININE 1.26* 1.28* 1.19 1.05 1.03  CALCIUM 8.5* 8.4* 8.0* 8.1* 8.1*  MG 1.9  --  2.2 2.0  --   PHOS 3.1  --  2.3*  --   --    Liver Function Tests:  Recent Labs Lab 06/25/15 0341 06/27/15 0400 06/28/15 0242  AST ALT 22 13* 5*  ALKPHOS 32* 29* 28*  BILITOT 0.9 0.6 0.7  PROT 6.2* 5.7* 5.6*  ALBUMIN 3.3* 2.7* 2.4*   No results for input(s): LIPASE, AMYLASE in the last 168 hours. No results for input(s): AMMONIA in the last 168 hours. CBC:  Recent Labs Lab 06/26/15 0225 06/27/15 0400 06/28/15 0242 06/29/15 0845 06/30/15 0332  WBC 12.2* 9.8 11.2* 11.3* 11.8*  NEUTROABS  --  5.5  --   --   --   HGB 12.7* 11.1* 10.6* 9.6* 9.4*  HCT 37.5* 33.3* 31.1* 28.9* 28.6*  MCV 94.9 93.3 94.2 93.5 94.1  PLT 199 199 225 265 329   Cardiac Enzymes:  Recent Labs Lab 06/25/15 0341 06/25/15 0805 06/25/15 1410  TROPONINI 0.06* 0.06* 0.04*   BNP: BNP (last 3 results)  Recent Labs  06/25/15 0341  BNP 200.8*    ProBNP (last 3 results) No results for input(s): PROBNP in the last 8760 hours.  CBG:  Recent Labs Lab 06/25/15 0227 06/25/15 0740  GLUCAP 127* 127*       Signed:  Avni Traore A  [facility] 06/30/2015, 3:17 PM

## 2015-07-01 LAB — CBC
HEMATOCRIT: 28.4 % — AB (ref 39.0–52.0)
Hemoglobin: 9.3 g/dL — ABNORMAL LOW (ref 13.0–17.0)
MCH: 30.9 pg (ref 26.0–34.0)
MCHC: 32.7 g/dL (ref 30.0–36.0)
MCV: 94.4 fL (ref 78.0–100.0)
PLATELETS: 374 10*3/uL (ref 150–400)
RBC: 3.01 MIL/uL — ABNORMAL LOW (ref 4.22–5.81)
RDW: 13.1 % (ref 11.5–15.5)
WBC: 11.1 10*3/uL — AB (ref 4.0–10.5)

## 2015-07-01 NOTE — Progress Notes (Signed)
PROGRESS NOTE  Jonathan SosDavid B Laws ZOX:[id number] DOB: Apr 06, 1951 DOA: 06/25/2015 PCP: No primary care provider on file.  Admit HPI / Brief Narrative: 64 year old male with HxDepression, SVT and Atrial fibrillation with indwelling pacemaker/defibrillator, CAD, COPD, CKD stage 2, and Parkinson's disease admitted to [facility] 05/2015 for an ablation. He was there for 3 days and almost immediately after being discharged reported "not feeling right". Since that time he experienced significant functional decline. SOB developed which greatly reduced his functional status. He had been unable to ambulate 10 feet without becoming markedly short of breath.  10/25 he suffered a syncopal episode.  [facility] ED. CTA of the chest noted large volume emboli throughout bilateral lungs. RV/LV ratio 1.09. He was hypoxemic requiring venti mask to keep sats in 90s. Also due to fall he suffered R hip fracture.   SIGNIFICANT EVENTS: 10/25 > presented to [facility] with syncope. PE diagnosed 10/26 > [facility] for [facility] EKOS 10/26 > transfer to SDU and to [facility]  HPI/Subjective: Discharged yesterday but still waiting for the bed. Likely to be discharged today.  Assessment/Plan:  Acute hypoxemic respiratory failure secondary to large volume acute pulmonary emboli Clinically stable at present - requiring only 2 L supplemental nasal cannula oxygen - continue anticoagulation   Bilateral submassive pulmonary emboli and DVT -Provoked in setting of recent hospitalization  -Heparin per pharmacy - discussed choices for conversion to oral treatment to include benefits and disadvantages associated with each agent and patient desires a NOAC - will ask case management to investigate co-pay/cost of the new medications and transition when that information is available -TTE - RV dysfunction moderate to severe  Will switch to Eliquis today. Ask pharmacy for news adjustment.  Rt hip fracture -S/p  surgical correction - PT/OT to continue per Orthopedics   Chronic Atrial fibrillation s/p ablation Sept 2016 NSR presently - follow on tele - not previously on chronic anticoagulation  Syncope history of same, cardiogenic in past, suspect now secondary to PE  HTN BP currently well controlled  H/o CAD, defibrillator in-situ  Mixed hyperlipidemia Total cholesterol 125, LDL is 62 and HDL is 32. Triglycerides 153.  CKD stage 2 Cr 1.3 on admission - renal function has now normalized  ?Cirrhosis noted on CTA chest Normal LFTs and INR, negative acute hepatitis panel. Likely hepatic steatosis rather than hepatic cirrhosis. Probably safe for the NOACs.  COPD without acute exacerbation Well compensated at present   SIRS, no obvious source of infection  Resolved   DM managed by diet A1c 5.9 - CBG well controlled  Parkinson's disease Cont home medical regimen   Depression  Code Status: FULL Family Communication: no family present at time of exam Disposition Plan: Stable for transfer to medical bed - continue PT/OT - ? CIR though patient highly motivated to DC home  Consultants: PCCM  Orthopedic Surgery   Procedures: 10/26 TTE EF 50% to 55% - grade 1 diastolic dysfunction - Right ventricle: Systolic function was moderately toseverely reduced 10/27 Venous dopplers - Subacute DVT noted in the left common femoral vein. Chronic vs indeterminate age DVT noted in the left popliteal vein. No DVT RLE. 10/27 R hip bipolar hemiarthroplasty   Antibiotics: none  DVT prophylaxis: IV heparin   Objective: Blood pressure 117/78, pulse 91, temperature 98.3 F (36.8 C), temperature source Oral, resp. rate 19, height 5\' 11"  (1.803 m), weight 106.9 kg (235 lb 10.8 oz), SpO2 94 %.  Intake/Output Summary (Last 24 hours) at 07/01/15  1339 Last data filed at 07/01/15 0839  Gross per 24 hour  Intake    840 ml  Output   1800 ml  Net   -960 ml   Exam: General: No acute respiratory  distress - alert and conversant Lungs: Clear to auscultation bilaterally without wheezes/crackles Cardiovascular: Regular rate and rhythm without murmur gallop or rub  Abdomen: Nontender, nondistended, soft, bowel sounds positive, no rebound, no ascites, no appreciable mass Extremities: No significant cyanosis, clubbing, edema bilateral lower extremities  Data Reviewed: Basic Metabolic Panel:  Recent Labs Lab 06/25/15 0341 06/25/15 0805 06/26/15 0225 06/27/15 0400 06/28/15 0242  NA 137 136 133* 135 134*  K 4.1 4.3 3.8 3.6 4.2  CL 103 101 99* 100* 98*  CO2 GLUCOSE 140* 132* 139* 122* 123*  BUN 22* 21* CREATININE 1.26* 1.28* 1.19 1.05 1.03  CALCIUM 8.5* 8.4* 8.0* 8.1* 8.1*  MG 1.9  --  2.2 2.0  --   PHOS 3.1  --  2.3*  --   --     CBC:  Recent Labs Lab 06/27/15 0400 06/28/15 0242 06/29/15 0845 06/30/15 0332 07/01/15 0546  WBC 9.8 11.2* 11.3* 11.8* 11.1*  NEUTROABS 5.5  --   --   --   --   HGB 11.1* 10.6* 9.6* 9.4* 9.3*  HCT 33.3* 31.1* 28.9* 28.6* 28.4*  MCV 93.3 94.2 93.5 94.1 94.4  PLT 199 225 265 329 374    Liver Function Tests:  Recent Labs Lab 06/25/15 0341 06/27/15 0400 06/28/15 0242  AST ALT 22 13* 5*  ALKPHOS 32* 29* 28*  BILITOT 0.9 0.6 0.7  PROT 6.2* 5.7* 5.6*  ALBUMIN 3.3* 2.7* 2.4*   Coags:  Recent Labs Lab 06/26/15 1715  INR 1.24    Recent Labs Lab 06/26/15 1715  APTT 29    Cardiac Enzymes:  Recent Labs Lab 06/25/15 0341 06/25/15 0805 06/25/15 1410  TROPONINI 0.06* 0.06* 0.04*    CBG:  Recent Labs Lab 06/25/15 0227 06/25/15 0740  GLUCAP 127* 127*    Recent Results (from the past 240 hour(s))  MRSA PCR Screening     Status: None   Collection Time: 06/25/15  2:11 AM  Result Value Ref Range Status   MRSA by PCR NEGATIVE NEGATIVE Final    Comment:        The GeneXpert MRSA Assay (FDA approved for NASAL specimens only), is one component of a comprehensive MRSA  colonization surveillance program. It is not intended to diagnose MRSA infection nor to guide or monitor treatment for MRSA infections.      Studies:   Recent x-ray studies have been reviewed in detail by the Attending Physician  Scheduled Meds:  Scheduled Meds: . amiodarone  200 mg Oral Daily  . antiseptic oral rinse  7 mL Mouth Rinse BID  . apixaban  10 mg Oral BID   Followed by  . [START ON 07/07/2015] apixaban  5 mg Oral BID  . aspirin  325 mg Oral Daily  . carbidopa-levodopa  2 tablet Oral TID  . carvedilol  6.25 mg Oral BID WC  . fenofibrate  160 mg Oral Daily  . mometasone-formoterol  2 puff Inhalation BID  . omega-3 acid ethyl esters  1 g Oral TID  . pantoprazole  40 mg Oral Daily  . polyethylene glycol  17 g Oral BID  . ranolazine  500 mg Oral BID  . senna-docusate  1 tablet Oral  BID  . simvastatin  20 mg Oral Daily  . spironolactone  25 mg Oral Daily  . tiotropium  18 mcg Inhalation Daily  . Vortioxetine HBr  20 mg Oral Daily    Time spent on care of this patient: 35 mins   Clint Lipps , MD   [facility] Office  [phone] Pager - Text Page per Loretha Stapler as per below:  On-Call/Text Page:      [email or web address]      password [email or web address]  If 7PM-7AM, please contact night-coverage [email or web address] Password [email or web address] 07/01/2015, 1:39 PM   LOS: 6 days

## 2015-07-01 NOTE — Progress Notes (Signed)
Physical Therapy Treatment Patient Details Name: Sigurd SosDavid B Tubby MRN: [id number] DOB: 11-08-1950 Today's Date: 07/01/2015    History of Present Illness 64 year old male with PMH of cardiovascular disease including tachyarrhythmia. Has had progressive SOB over the past few weeks. Syncopal episode 10/25 with Mildly displaced, non-comminuted, fracture of R femoral neck . Found to have large PE with significant clot burden and increased RV/LV ratio. Transferred to [facility] for possible EKOS. S/P HEMIARTHROPLASTY 06/26/15    PT Comments    Pt performed stair training well today, requiring min guard for safety. Pt had reciprocal stepping pattern while stair training. Pt became short of breath after 2 trials of stair training, O2 sats 94. Pt unable to gait train after stair training due to an increase in fatigue. Pt stated he was feeling his hip "pop" however it was not painful, PT informed. HEP exercises performed in recliner with no increase in pain.   Follow Up Recommendations  SNF           Precautions / Restrictions Precautions Precautions: Posterior Hip Precaution Booklet Issued: Yes (comment) Precaution Comments: Pt able to follow precautions given to him. Restrictions Weight Bearing Restrictions: Yes RLE Weight Bearing: Weight bearing as tolerated    Mobility                    Transfers Overall transfer level: Needs assistance Equipment used: Rolling walker (2 wheeled) Transfers: Sit to/from Stand Sit to Stand: Min guard Stand pivot transfers: Min guard       General transfer comment: Pt still descending into recliner quickly. Required min guard to increase safety. Head of recliner tilted back to avoid hip bending past 90.                   Stairs Stairs: Yes   Stair Management: Alternating pattern;Two rails Number of Stairs: 3 (x2 trials) General stair comments: Pt stair training went very well. Pt able to ambulate stairs requiring min guard assistance  for safety. Pt did curb training with walker requiring min guard for safety.                 Cognition Arousal/Alertness: Awake/alert Behavior During Therapy: WFL for tasks assessed/performed Overall Cognitive Status: Within Functional Limits for tasks assessed                      Exercises Total Joint Exercises Ankle Circles/Pumps: AROM;Both;20 reps;Seated Quad Sets: Strengthening;Right;15 reps;Seated (5 second hold) Short Arc Quad: AROM;10 reps;Right;Seated Heel Slides: Strengthening;Right;10 reps;Seated (head of recliner back) Hip ABduction/ADduction: AROM;5 reps;Right (pt stated he felt slight pain during hip abduction exercise)        Pertinent Vitals/Pain Pain Assessment: 0-10 Pain Score: 3  Pain Location: RLE Pain Intervention(s): Monitored during session           PT Goals (current goals can now be found in the care plan section) Acute Rehab PT Goals Patient Stated Goal: to not be in SNF for longer than two weeks. Progress towards PT goals: Progressing toward goals    Frequency  Min 4X/week    PT Plan Current plan remains appropriate       End of Session Equipment Utilized During Treatment: Gait belt Activity Tolerance: Patient tolerated treatment well Patient left: in chair;with call bell/phone within reach     Time: 1028-1058 PT Time Calculation (min) (ACUTE ONLY): 30 min  Charges:    1 Gait 1TE  Laurena Slimmer, [place] [phone] OFFICE  07/01/2015, 11:18 AM

## 2015-07-01 NOTE — Discharge Summary (Signed)
Physician Discharge Summary  Jonathan Stafford JXB:[id number] DOB: 08-30-1951 DOA: 06/25/2015  PCP: No primary care provider on file.  Admit date: 06/25/2015 Discharge date: 07/01/2015  Time spent: 40 minutes  Recommendations for Outpatient Follow-up:  1. Follow up with the nursing home M.D. 2. Eliquis prescribed for PE, take 2 mg by mouth twice a day for total 14 days, then continue 5 mg by mouth twice a day for at least 6 months.  Discharge Diagnoses:  Principal Problem:   Pulmonary embolism (HCC) Active Problems:   Fracture of femoral neck, right (HCC)   Acute blood loss anemia   Syncope and collapse   Parkinson's disease (HCC)   Acute deep vein thrombosis (DVT) of femoral vein of left lower extremity [place])   Discharge Condition: Stable  Diet recommendation: Heart healthy  Filed Weights   06/28/15 0816 06/29/15 0500 07/01/15 0650  Weight: 104.509 kg (230 lb 6.4 oz) 106.6 kg (235 lb 0.2 oz) 106.9 kg (235 lb 10.8 oz)    History of present illness:  64 year old male with PMH as below, which includes SVT and Atrial fibrillation with indwelling pacemaker/defibrillator, CAD, COPD, CKD2, and parkinson's disease. He was recently admitted to [facility] 05/2015 for ablation. He was there for 3 days, and almost immediately after being discharged he reports "not feeling right". Since that time his has had significant functional decline. For the past week or two SOB has developed, which greatly reduced his functional status. He has been unable to even leave the house, and more recently (last couple of days) he had been unable to ambulate 10 feet without becoming markedly short of breath. He denies associated chest pain, fevers, chills, or cough. 10/25 he suffered a syncopal episode. He denies prodrome, and states that he just fell out out of nowhere. He did lose consciousness. He does not recall hitting his head during fall. He presented to [facility] ED after syncope. CTA of the chest  was performed and found Large volume emboli throughout bilateral lungs. RV/LV ratio 1.09. He was hypoxemic requiring venti mask to keep sats in 90s. Also due to fall he suffered R hip fracture.  Hospital Course:   Acute hypoxemic respiratory failure secondary to large volume acute pulmonary emboli Clinically stable at present - requiring only 2 L supplemental nasal cannula oxygen - continue anticoagulation   Bilateral submassive pulmonary emboli or DVT -Provoked in setting of recent hospitalization  -TTE - RV dysfunction moderate to severe  -This is treated initially with heparin for 5 conservative days. -Started on Eliquis on discharge, suggest to take for 6 month.  Rt hip fracture -Secondary to fall which is likely secondary to the submassive PE. -Status post hip replacement. PT/OT to continue per Orthopedics  -Discharged to nursing home, patient is on Eliquis for treatment of PE, covers for DVT prophylaxis.  History of Chronic Atrial fibrillation s/p ablation Sept 2016 NSR presently,  status post ablation, currently normal sinus rhythm with first-degree AV block.  Syncope history of same, cardiogenic in past, suspect now secondary to PE  HTN BP currently well controlled  H/o CAD, defibrillator in-situ  HLD Total cholesterol 125, LDL is 62 and HDL is 32. Triglycerides 153.  CKD stage 2 Cr 1.3 on admission - renal function has now normalized  ?Cirrhosis noted on CTA chest Normal LFTs and INR, negative acute hepatitis panel. Likely hepatic steatosis rather than hepatic cirrhosis. Probably safe for the NOACs.  COPD without acute exacerbation Well compensated at present   SIRS,  no obvious source of infection  Resolved   DM managed by diet A1c 5.9 - CBG well controlled  Parkinson's disease Cont home medical regimen    Procedures:  Right hip hemiarthroplasty done by Dr. Wandra Feinstein. Murphy on 06/26/2015  Consultations:  Orthopedics surgery.  Patient was under PCCM,  till 10/27.  Discharge Exam: Filed Vitals:   07/01/15 1045  BP:   Pulse: 91  Temp:   Resp:    General: Alert and awake, oriented x3, not in any acute distress. HEENT: anicteric sclera, pupils reactive to light and accommodation, EOMI CVS: S1-S2 clear, no murmur rubs or gallops Chest: clear to auscultation bilaterally, no wheezing, rales or rhonchi Abdomen: soft nontender, nondistended, normal bowel sounds, no organomegaly Extremities: no cyanosis, clubbing or edema noted bilaterally Neuro: Cranial nerves II-XII intact, no focal neurological deficits  Discharge Instructions   Discharge Instructions    Diet - low sodium heart healthy    Complete by:  As directed      Increase activity slowly    Complete by:  As directed      Posterior total hip precautions    Complete by:  As directed      Weight bearing as tolerated    Complete by:  As directed   Laterality:  right  Extremity:  Lower          Current Discharge Medication List    START taking these medications   Details  apixaban (ELIQUIS) 5 MG TABS tablet Take 2 tablets total of 10 mg by mouth twice a day for 14 days, then switch to 5 mg twice a day    HYDROcodone-acetaminophen (NORCO) 5-325 MG tablet Take 1-2 tablets by mouth every 6 (six) hours as needed for moderate pain. Qty: 90 tablet, Refills: 0    methocarbamol (ROBAXIN) 500 MG tablet Take 1 tablet (500 mg total) by mouth 4 (four) times daily. Qty: 60 tablet, Refills: 0      CONTINUE these medications which have CHANGED   Details  aspirin 81 MG EC tablet Take 1 tablet (81 mg total) by mouth daily. Qty: 30 tablet      CONTINUE these medications which have NOT CHANGED   Details  amiodarone (PACERONE) 200 MG tablet Take 200 mg by mouth daily.    carbidopa-levodopa (SINEMET CR) 50-200 MG tablet Take 2 tablets by mouth 3 (three) times daily.     carvedilol (COREG) 6.25 MG tablet Take 6.25 mg by mouth 2 (two) times daily with a meal.    fenofibrate  (TRICOR) 145 MG tablet Take 145 mg by mouth daily.    Fluticasone-Salmeterol (ADVAIR) 250-50 MCG/DOSE AEPB Inhale 1 puff into the lungs 2 (two) times daily.    omega-3 acid ethyl esters (LOVAZA) 1 G capsule Take 1 g by mouth 3 (three) times daily.    pantoprazole (PROTONIX) 40 MG tablet Take 40 mg by mouth daily.    ranolazine (RANEXA) 500 MG 12 hr tablet Take 500 mg by mouth 2 (two) times daily.    simvastatin (ZOCOR) 20 MG tablet Take 20 mg by mouth daily.    spironolactone (ALDACTONE) 25 MG tablet Take 25 mg by mouth daily.    sucralfate (CARAFATE) 1 G tablet Take 1 g by mouth 4 (four) times daily.    tiotropium (SPIRIVA) 18 MCG inhalation capsule Place 18 mcg into inhaler and inhale daily.    Vortioxetine HBr (BRINTELLIX) 20 MG TABS Take 20 mg by mouth daily.    zolpidem (AMBIEN) 5 MG tablet Take  5-10 mg by mouth at bedtime as needed for sleep.       No Known Allergies Follow-up Information    Follow up with MURPHY, TIMOTHY D, MD In 10 days.   Specialty:  Orthopedic Surgery   Contact information:   [address] [place] [address] [phone]        The results of significant diagnostics from this hospitalization (including imaging, microbiology, ancillary and laboratory) are listed below for reference.    Significant Diagnostic Studies: Pelvis Portable  06/26/2015  CLINICAL DATA:  Postop right hip arthroplasty. EXAM: PORTABLE PELVIS 1-2 VIEWS COMPARISON:  06/24/2015 FINDINGS: Patient has undergone right hip hemiarthroplasty. The femoral head component appears seated within the acetabulum on this frontal view. No acute fractures are identified. IMPRESSION: Status post hemi arthroplasty.  No adverse features. Electronically Signed   By: Norva Pavlov M.D.   On: 06/26/2015 20:23    Microbiology: Recent Results (from the past 240 hour(s))  MRSA PCR Screening     Status: None   Collection Time: 06/25/15  2:11 AM  Result Value Ref Range Status    MRSA by PCR NEGATIVE NEGATIVE Final    Comment:        The GeneXpert MRSA Assay (FDA approved for NASAL specimens only), is one component of a comprehensive MRSA colonization surveillance program. It is not intended to diagnose MRSA infection nor to guide or monitor treatment for MRSA infections.      Labs: Basic Metabolic Panel:  Recent Labs Lab 06/25/15 0341 06/25/15 0805 06/26/15 0225 06/27/15 0400 06/28/15 0242  NA 137 136 133* 135 134*  K 4.1 4.3 3.8 3.6 4.2  CL 103 101 99* 100* 98*  CO2 GLUCOSE 140* 132* 139* 122* 123*  BUN 22* 21* CREATININE 1.26* 1.28* 1.19 1.05 1.03  CALCIUM 8.5* 8.4* 8.0* 8.1* 8.1*  MG 1.9  --  2.2 2.0  --   PHOS 3.1  --  2.3*  --   --    Liver Function Tests:  Recent Labs Lab 06/25/15 0341 06/27/15 0400 06/28/15 0242  AST ALT 22 13* 5*  ALKPHOS 32* 29* 28*  BILITOT 0.9 0.6 0.7  PROT 6.2* 5.7* 5.6*  ALBUMIN 3.3* 2.7* 2.4*   No results for input(s): LIPASE, AMYLASE in the last 168 hours. No results for input(s): AMMONIA in the last 168 hours. CBC:  Recent Labs Lab 06/27/15 0400 06/28/15 0242 06/29/15 0845 06/30/15 0332 07/01/15 0546  WBC 9.8 11.2* 11.3* 11.8* 11.1*  NEUTROABS 5.5  --   --   --   --   HGB 11.1* 10.6* 9.6* 9.4* 9.3*  HCT 33.3* 31.1* 28.9* 28.6* 28.4*  MCV 93.3 94.2 93.5 94.1 94.4  PLT 199 225 265 329 374   Cardiac Enzymes:  Recent Labs Lab 06/25/15 0341 06/25/15 0805 06/25/15 1410  TROPONINI 0.06* 0.06* 0.04*   BNP: BNP (last 3 results)  Recent Labs  06/25/15 0341  BNP 200.8*    ProBNP (last 3 results) No results for input(s): PROBNP in the last 8760 hours.  CBG:  Recent Labs Lab 06/25/15 0227 06/25/15 0740  GLUCAP 127* 127*       Signed:  Kortney Schoenfelder A  [facility] 07/01/2015, 1:51 PM

## 2015-07-01 NOTE — Progress Notes (Signed)
Noted plans for SNF at this time. Min guard assist level. Pt not in need of intense inpt rehab at this level. We will sign off. [phone]

## 2015-07-01 NOTE — Clinical Social Work Placement (Signed)
   CLINICAL SOCIAL WORK PLACEMENT  NOTE  Date:  07/01/2015  Patient Details  Name: Sigurd SosDavid B Laban MRN: [id number] Date of Birth: 04-Sep-1950  Clinical Social Work is seeking post-discharge placement for this patient at the Skilled  Nursing Facility level of care (*CSW will initial, date and re-position this form in  chart as items are completed):  Yes   Patient/family provided with [facility] Clinical Social Work Department's list of facilities offering this level of care within the geographic area requested by the patient (or if unable, by the patient's family).  Yes   Patient/family informed of their freedom to choose among providers that offer the needed level of care, that participate in Medicare, Medicaid or managed care program needed by the patient, have an available bed and are willing to accept the patient.  Yes   Patient/family informed of [facility]'s ownership interest in [facility] and [facility], as well as of the fact that they are under no obligation to receive care at these facilities.  PASRR submitted to EDS on 07/01/15     PASRR number received on 07/01/15     Existing PASRR number confirmed on  (n/a)     FL2 transmitted to all facilities in geographic area requested by pt/family on 07/01/15     FL2 transmitted to all facilities within larger geographic area on       Patient informed that his/her managed care company has contracts with or will negotiate with certain facilities, including the following:            Patient/family informed of bed offers received.  Patient chooses bed at       Physician recommends and patient chooses bed at      Patient to be transferred to   on  .  Patient to be transferred to facility by       Patient family notified on   of transfer.  Name of family member notified:        PHYSICIAN Please sign FL2     Additional Comment:    _______________________________________________ Rod MaeVaughn, Shemeika Starzyk S,  LCSW 07/01/2015, 11:41 AM

## 2015-07-01 NOTE — [place] FL2 (Signed)
[place] MEDICAID FL2 LEVEL OF CARE SCREENING TOOL     IDENTIFICATION  Patient Name: Jonathan Stafford Birthdate: February 22, 1951 Sex: male Admission Date (Current Location): 06/25/2015  [facility] and [place] Number:     Facility and Address:  [facility] [place], [address], [place], [place] [address]      Provider Number: [id number]  Attending Physician Name and Address:  Clydia Llano, MD  Relative Name and Phone Number:       Current Level of Care: Hospital Recommended Level of Care: Skilled Nursing Facility Prior Approval Number:    Date Approved/Denied:   PASRR Number:   [id number]  Discharge Plan: SNF    Current Diagnoses: Patient Active Problem List   Diagnosis Date Noted  . Acute blood loss anemia 06/29/2015  . Syncope and collapse 06/29/2015  . Parkinson's disease (HCC) 06/29/2015  . Acute deep vein thrombosis (DVT) of femoral vein of left lower extremity (HCC) 06/29/2015  . Fracture of femoral neck, right (HCC) 06/28/2015  . Pulmonary embolism (HCC) 06/25/2015    Orientation ACTIVITIES/SOCIAL BLADDER RESPIRATION    Self, Time, Situation, Place  Family supportive, Active Continent O2 (As needed) (2/ L min)  BEHAVIORAL SYMPTOMS/MOOD NEUROLOGICAL BOWEL NUTRITION STATUS  Other (Comment) (n/a)  (n/a) Continent Diet (Carb modified, please see discharge summary for any changes.)  PHYSICIAN VISITS COMMUNICATION OF NEEDS Height & Weight Skin    Verbally  (180.3 cm) 235 lbs. Surgical wounds (R hip)          AMBULATORY STATUS RESPIRATION     (Minimal guard) O2 (As needed) (2/ L min)      Personal Care Assistance Level of Assistance  Bathing, Dressing Bathing Assistance: Limited assistance   Dressing Assistance: Limited assistance      Functional Limitations Info   (n/a)             SPECIAL CARE FACTORS FREQUENCY  PT (By licensed PT), OT (By licensed OT)     PT Frequency: 5 OT Frequency: 5           Additional  Factors Info  Code Status, Allergies Code Status Info: FULL Allergies Info: No known allergies.           Current Medications (07/01/2015): Current Facility-Administered Medications  Medication Dose Route Frequency Provider Last Rate Last Dose  . 0.9 %  sodium chloride infusion  250 mL Intravenous PRN Lupita Leash, MD      . acetaminophen (TYLENOL) tablet 650 mg  650 mg Oral Q4H PRN Lupita Leash, MD      . amiodarone (PACERONE) tablet 200 mg  200 mg Oral Daily Duayne Cal, NP   200 mg at 06/30/15 1117  . antiseptic oral rinse (CPC / CETYLPYRIDINIUM CHLORIDE 0.05%) solution 7 mL  7 mL Mouth Rinse BID Praveen Mannam, MD   7 mL at 06/30/15 2257  . apixaban (ELIQUIS) tablet 10 mg  10 mg Oral BID Scarlett Presto, RPH   10 mg at 06/30/15 2256   Followed by  . [START ON 07/07/2015] apixaban (ELIQUIS) tablet 5 mg  5 mg Oral BID Scarlett Presto, [facility]      . aspirin EC tablet 325 mg  325 mg Oral Daily Duayne Cal, NP   325 mg at 06/30/15 1119  . carbidopa-levodopa (SINEMET CR) 50-200 MG per tablet controlled release 2 tablet  2 tablet Oral TID Scarlett Presto, [facility]   2 tablet at 07/01/15 2841  . carvedilol (COREG)  tablet 6.25 mg  6.25 mg Oral BID WC Duayne CalPaul W Hoffman, NP   6.25 mg at 07/01/15 [id number]  . fenofibrate tablet 160 mg  160 mg Oral Daily Duayne CalPaul W Hoffman, NP   160 mg at 06/30/15 1118  . HYDROcodone-acetaminophen (NORCO/VICODIN) 5-325 MG per tablet 1-2 tablet  1-2 tablet Oral Q4H PRN Leanne ChangKatherine P Schorr, NP   1 tablet at 06/30/15 2256  . magnesium citrate solution 1 Bottle  1 Bottle Oral Once PRN Lonia BloodJeffrey T McClung, MD      . menthol-cetylpyridinium (CEPACOL) lozenge 3 mg  1 lozenge Oral PRN Janalee DaneBrittney Kelly, PA-C       Or  . phenol (CHLORASEPTIC) mouth spray 1 spray  1 spray Mouth/Throat PRN Janalee DaneBrittney Kelly, PA-C      . mometasone-formoterol (DULERA) 100-5 MCG/ACT inhaler 2 puff  2 puff Inhalation BID Duayne CalPaul W Hoffman, NP   2 puff at 07/01/15 0818  . morphine 2 MG/ML injection 2-4 mg  2-4 mg  Intravenous Q3H PRN Lupita Leashouglas B McQuaid, MD   2 mg at 06/28/15 [id number]  . omega-3 acid ethyl esters (LOVAZA) capsule 1 g  1 g Oral TID Duayne CalPaul W Hoffman, NP   1 g at 06/30/15 2256  . ondansetron (ZOFRAN) injection 4 mg  4 mg Intravenous Q6H PRN Lupita Leashouglas B McQuaid, MD      . pantoprazole (PROTONIX) EC tablet 40 mg  40 mg Oral Daily Duayne CalPaul W Hoffman, NP   40 mg at 06/30/15 1124  . polyethylene glycol (MIRALAX / GLYCOLAX) packet 17 g  17 g Oral BID Lonia BloodJeffrey T McClung, MD   17 g at 06/30/15 2256  . ranolazine (RANEXA) 12 hr tablet 500 mg  500 mg Oral BID Lonia BloodJeffrey T McClung, MD   500 mg at 06/30/15 2256  . senna-docusate (Senokot-S) tablet 1 tablet  1 tablet Oral BID Lonia BloodJeffrey T McClung, MD   1 tablet at 06/30/15 2255  . simvastatin (ZOCOR) tablet 20 mg  20 mg Oral Daily Duayne CalPaul W Hoffman, NP   20 mg at 06/30/15 1117  . spironolactone (ALDACTONE) tablet 25 mg  25 mg Oral Daily Duayne CalPaul W Hoffman, NP   25 mg at 06/30/15 1123  . tiotropium (SPIRIVA) inhalation capsule 18 mcg  18 mcg Inhalation Daily Duayne CalPaul W Hoffman, NP   18 mcg at 07/01/15 0818  . Vortioxetine HBr (BRINTELLIX) 20 MG tablet 20 mg  20 mg Oral Daily Lonia BloodJeffrey T McClung, MD   20 mg at 06/30/15 1123   Do not use this list as official medication orders. Please verify with discharge summary.  Discharge Medications:   Medication List    TAKE these medications        amiodarone 200 MG tablet  Commonly known as:  PACERONE  Take 200 mg by mouth daily.     apixaban 5 MG Tabs tablet  Commonly known as:  ELIQUIS  Take 2 tablets total of 10 mg by mouth twice a day for 14 days, then switch to 5 mg twice a day     aspirin 81 MG EC tablet  Take 1 tablet (81 mg total) by mouth daily.     carbidopa-levodopa 50-200 MG tablet  Commonly known as:  SINEMET CR  Take 2 tablets by mouth 3 (three) times daily.     carvedilol 6.25 MG tablet  Commonly known as:  COREG  Take 6.25 mg by mouth 2 (two) times daily with a meal.     fenofibrate 145 MG tablet  Commonly known  as:  TRICOR  Take 145 mg by mouth daily.     Fluticasone-Salmeterol 250-50 MCG/DOSE Aepb  Commonly known as:  ADVAIR  Inhale 1 puff into the lungs 2 (two) times daily.     HYDROcodone-acetaminophen 5-325 MG tablet  Commonly known as:  NORCO  Take 1-2 tablets by mouth every 6 (six) hours as needed for moderate pain.     methocarbamol 500 MG tablet  Commonly known as:  ROBAXIN  Take 1 tablet (500 mg total) by mouth 4 (four) times daily.     omega-3 acid ethyl esters 1 G capsule  Commonly known as:  LOVAZA  Take 1 g by mouth 3 (three) times daily.     pantoprazole 40 MG tablet  Commonly known as:  PROTONIX  Take 40 mg by mouth daily.     ranolazine 500 MG 12 hr tablet  Commonly known as:  RANEXA  Take 500 mg by mouth 2 (two) times daily.     simvastatin 20 MG tablet  Commonly known as:  ZOCOR  Take 20 mg by mouth daily.     spironolactone 25 MG tablet  Commonly known as:  ALDACTONE  Take 25 mg by mouth daily.     sucralfate 1 G tablet  Commonly known as:  CARAFATE  Take 1 g by mouth 4 (four) times daily.     tiotropium 18 MCG inhalation capsule  Commonly known as:  SPIRIVA  Place 18 mcg into inhaler and inhale daily.     Vortioxetine HBr 20 MG Tabs  Commonly known as:  BRINTELLIX  Take 20 mg by mouth daily.     zolpidem 5 MG tablet  Commonly known as:  AMBIEN  Take 5-10 mg by mouth at bedtime as needed for sleep.        Relevant Imaging Results:  Relevant Lab Results:  Recent Labs    Additional Information    Rojelio Brenner [phone]

## 2015-07-01 NOTE — Clinical Social Work Placement (Signed)
   CLINICAL SOCIAL WORK PLACEMENT  NOTE  Date:  07/01/2015  Patient Details  Name: Jonathan Stafford MRN: [id number] Date of Birth: 06/20/1951  Clinical Social Work is seeking post-discharge placement for this patient at the Skilled  Nursing Facility level of care (*CSW will initial, date and re-position this form in  chart as items are completed):  Yes   Patient/family provided with [facility] Clinical Social Work Department's list of facilities offering this level of care within the geographic area requested by the patient (or if unable, by the patient's family).  Yes   Patient/family informed of their freedom to choose among providers that offer the needed level of care, that participate in Medicare, Medicaid or managed care program needed by the patient, have an available bed and are willing to accept the patient.  Yes   Patient/family informed of [facility]'s ownership interest in [facility] and [facility], as well as of the fact that they are under no obligation to receive care at these facilities.  PASRR submitted to EDS on 07/01/15     PASRR number received on 07/01/15     Existing PASRR number confirmed on  (n/a)     FL2 transmitted to all facilities in geographic area requested by pt/family on 07/01/15     FL2 transmitted to all facilities within larger geographic area on       Patient informed that his/her managed care company has contracts with or will negotiate with certain facilities, including the following:        Yes   Patient/family informed of bed offers received.  [facility]     Physician recommends and patient chooses bed at      [facility] 07[facility].  Patient to be transferred to facility by [facility]     Patient family notified on 07/01/15 of transfer.  Name of family member notified:  Patient at bedside.     PHYSICIAN       Additional Comment:     _______________________________________________ Rod MaeVaughn, Denai Caba S, LCSW 07/01/2015, 3:52 PM

## 2015-07-01 NOTE — Discharge Planning (Signed)
Patient to be discharged to [facility]. Patient updated regarding discharge.  Facility[facility] RN report number: [phone] Transportation: EMS  Marcelline Deistmily Niaomi Cartaya, [place] - [phone] Clinical Social Work Department Orthopedics [phone]([id number]) and Surgical [phone]([id number])

## 2015-07-01 NOTE — Clinical Social Work Note (Signed)
CSW updated this a.m patient now agreeable to SNF placement at time of discharge. CSW met with patient to discuss discharge disposition. Patient understanding of PT recommendation of SNF placement, and would prefer placement in [place] area. Per patient, patient's sister has previously completed short-term rehabilitation at [facility]. CSW to contact facility and complete SNF search for possible discharge to SNF today.  Per patient's chart, patient medically stable for discharge.  Barrier to discharge: Patient currently with no bed offers. CSW actively working on SNF placement.  Lubertha Sayres, [place] [facility] Clinical Social Work Department Orthopedics [phone]) and Surgical [phone])

## 2015-07-08 LAB — DRVVT MIX: dRVVT Mix: 45.1 s — ABNORMAL HIGH (ref 0.0–44.0)

## 2015-07-08 LAB — LUPUS ANTICOAGULANT PANEL
DRVVT: 45.4 s — ABNORMAL HIGH (ref 0.0–44.0)
PTT Lupus Anticoagulant: 109.6 s — ABNORMAL HIGH (ref 0.0–40.6)

## 2015-07-08 LAB — PTT-LA MIX: PTT-LA MIX: 71.2 s — AB (ref 0.0–40.6)

## 2015-07-08 LAB — HEXAGONAL PHASE PHOSPHOLIPID: Hexagonal Phase Phospholipid: 13 s — ABNORMAL HIGH (ref 0–11)

## 2015-07-08 LAB — DRVVT CONFIRM: dRVVT Confirm: 1 ratio (ref 0.8–1.2)

## 2016-02-23 IMAGING — CR DG PORTABLE PELVIS
1 series · 1 of 1 positions shown · non-contrast
Comparison: 06/24/2015

CLINICAL DATA: Postop right hip arthroplasty.

EXAM:
PORTABLE PELVIS 1-2 VIEWS

[AP]
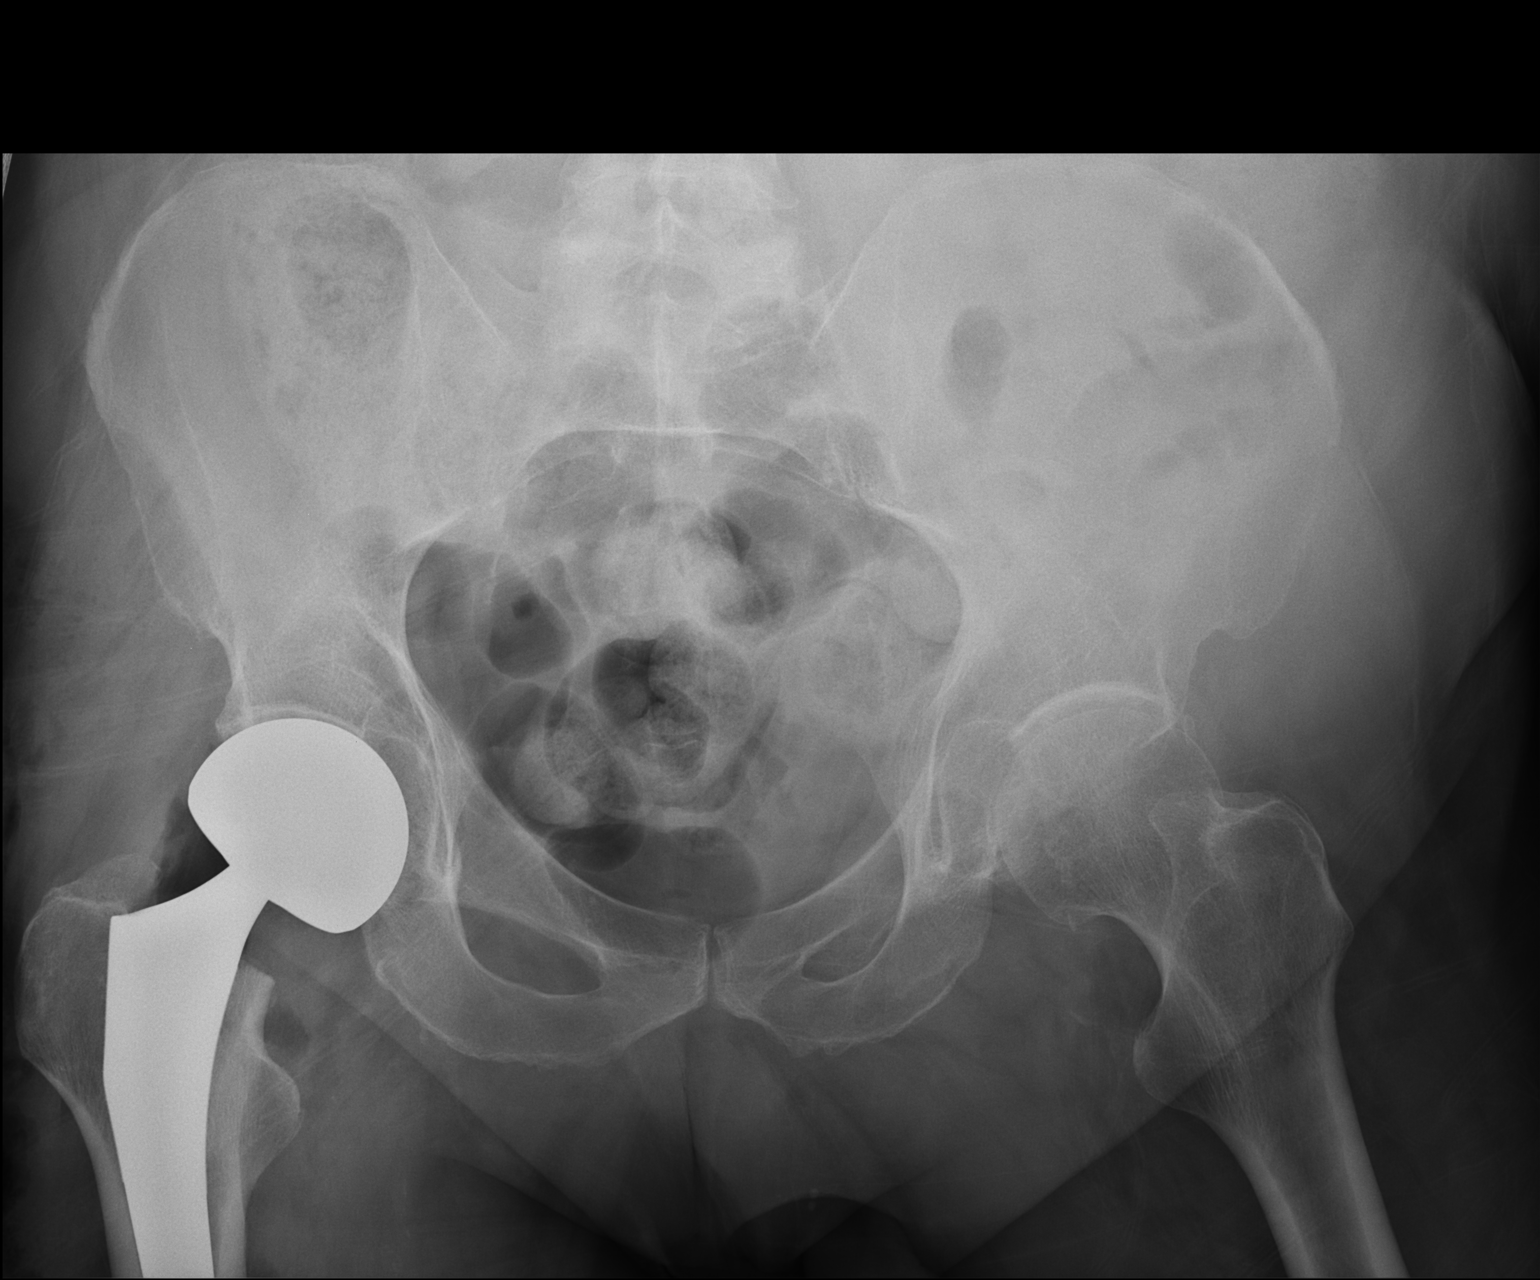

[1 of 1 positions shown; findings below may reference images not displayed]

FINDINGS: Patient has undergone right hip hemiarthroplasty. The femoral head
component appears seated within the acetabulum on this frontal view.
No acute fractures are identified.
IMPRESSION: Status post hemi arthroplasty.  No adverse features.

## 2019-03-07 ENCOUNTER — Encounter: Payer: MEDICARE | Attending: Internal Medicine | Primary: Family Medicine

## 2019-03-08 ENCOUNTER — Encounter: Admit: 2019-03-08 | Discharge: 2019-03-08 | Payer: MEDICARE | Attending: Internal Medicine | Primary: Family Medicine

## 2019-04-20 MED ORDER — SPIRONOLACTONE 25 MG TAB
25 mg | ORAL_TABLET | ORAL | 0 refills | Status: DC
Start: 2019-04-20 — End: 2019-06-16

## 2019-04-20 NOTE — Telephone Encounter (Signed)
Patient's son left message on voicemail stating that his father's prescriptions were denied because patient needs to be seen.  Son questioning this because he just had a virtual visit on 03/08/2019.

## 2019-04-25 MED ORDER — TRINTELLIX 20 MG TABLET
20 mg | ORAL_TABLET | ORAL | 0 refills | Status: DC
Start: 2019-04-25 — End: 2019-07-30

## 2019-04-25 NOTE — Telephone Encounter (Signed)
Called and advised patient's son and he stated that it wasn't for the potassium pills.  It is for all his other medications he is in need of.  He said that Carlos Petersen is out of his Trintellix.  His son stated that they don't have a vehicle he can get in right now.  They had to sell stuff to put a down payment on a house.

## 2019-05-31 MED ORDER — SIMVASTATIN 20 MG TAB
20 mg | ORAL_TABLET | ORAL | 2 refills | Status: DC
Start: 2019-05-31 — End: 2019-11-27

## 2019-05-31 NOTE — Telephone Encounter (Signed)
Has been sent to the pharmacy

## 2019-06-16 MED ORDER — SPIRONOLACTONE 25 MG TAB
25 mg | ORAL_TABLET | ORAL | 0 refills | Status: AC
Start: 2019-06-16 — End: 2019-09-14

## 2019-06-16 MED ORDER — CARVEDILOL 6.25 MG TAB
6.25 mg | ORAL_TABLET | ORAL | 0 refills | Status: AC
Start: 2019-06-16 — End: 2019-09-14

## 2019-07-03 MED ORDER — FENOFIBRATE NANOCRYSTALLIZED 145 MG TAB
145 mg | ORAL_TABLET | ORAL | 0 refills | Status: DC
Start: 2019-07-03 — End: 2019-09-13

## 2019-07-30 MED ORDER — TRINTELLIX 20 MG TABLET
20 mg | ORAL_TABLET | ORAL | 1 refills | Status: DC
Start: 2019-07-30 — End: 2019-08-06

## 2019-08-06 ENCOUNTER — Telehealth

## 2019-08-06 NOTE — Telephone Encounter (Signed)
Pts trintellix is not covered for BID but it is covered for every day can this be changed to every day?

## 2019-08-07 ENCOUNTER — Telehealth

## 2019-08-07 MED ORDER — VORTIOXETINE 20 MG TABLET
20 mg | ORAL_TABLET | Freq: Every day | ORAL | 1 refills | Status: DC
Start: 2019-08-07 — End: 2019-11-27

## 2019-08-07 NOTE — Telephone Encounter (Signed)
[facility] sent a fax requesting     Novolog Flexpen Syringe - Inject 2 units per ever 50 above 150 in sliding scale as discussed.    Last dispensed 08/17/2018    LOV 03/08/2019

## 2019-08-09 NOTE — Telephone Encounter (Signed)
Called Pharmacy spoke to pharmacist she said it was for the son and you already send order.

## 2019-09-13 MED ORDER — FENOFIBRATE NANOCRYSTALLIZED 145 MG TAB
145 mg | ORAL_TABLET | ORAL | 1 refills | Status: DC
Start: 2019-09-13 — End: 2019-11-27

## 2019-09-13 MED ORDER — ELIQUIS 5 MG TABLET
5 mg | ORAL_TABLET | ORAL | 2 refills | Status: DC
Start: 2019-09-13 — End: 2019-11-27

## 2019-09-13 MED ORDER — TRELEGY ELLIPTA 100 MCG-62.5 MCG-25 MCG POWDER FOR INHALATION
RESPIRATORY_TRACT | 4 refills | Status: DC
Start: 2019-09-13 — End: 2019-11-27

## 2019-09-13 MED ORDER — RANOLAZINE SR 500 MG 12 HR TAB
500 mg | ORAL_TABLET | ORAL | 3 refills | Status: DC
Start: 2019-09-13 — End: 2019-11-27

## 2019-10-22 ENCOUNTER — Encounter: Payer: MEDICARE | Attending: Family Medicine | Primary: Family Medicine

## 2019-10-22 MED ORDER — CARVEDILOL 6.25 MG TAB
6.25 mg | ORAL_TABLET | ORAL | 0 refills | Status: DC
Start: 2019-10-22 — End: 2019-11-27

## 2019-10-22 MED ORDER — SPIRONOLACTONE 25 MG TAB
25 mg | ORAL_TABLET | ORAL | 0 refills | Status: DC
Start: 2019-10-22 — End: 2019-11-27

## 2019-10-22 MED ORDER — OMEPRAZOLE 40 MG CAP, DELAYED RELEASE
40 mg | ORAL_CAPSULE | ORAL | 0 refills | Status: DC
Start: 2019-10-22 — End: 2019-11-27

## 2019-11-02 ENCOUNTER — Encounter: Primary: Family Medicine

## 2019-11-09 ENCOUNTER — Ambulatory Visit: Admit: 2019-11-09 | Discharge: 2019-11-09 | Payer: MEDICARE | Attending: Family Medicine | Primary: Family Medicine

## 2019-11-09 ENCOUNTER — Ambulatory Visit: Attending: Family Medicine | Primary: Family Medicine

## 2019-11-09 DIAGNOSIS — I119 Hypertensive heart disease without heart failure: Secondary | ICD-10-CM

## 2019-11-09 NOTE — Progress Notes (Signed)
Carlos Petersen is a 69 y.o. male and presents with New Patient, Establish Care, Fall (Patient states he fell yesterdy getting out of the shower and thinks he may have dislocated his right shoulder ), and Dizziness (Patient states he has been having dizziness x2 years. )  .  HPI   69 yo WM with a hx of HTN and CAD here to establish and s/p fall last night from a shower chair with right shoulder displacement and pain that is not worsening  Subjective:  Cardiovascular Review:  The patient has hypertension   Diet and Lifestyle: generally follows a low fat low cholesterol diet, generally follows a low sodium diet, exercises sporadically  Home BP Monitoring: is not measured at home.  Pertinent ROS: taking medications as instructed, no medication side effects noted, no TIA's, no chest pain on exertion, no dyspnea on exertion, no swelling of ankles.     Review of Systems  Review of Systems   Constitutional: Negative.  Negative for chills and fever.   HENT: Negative.  Negative for congestion, ear discharge, hearing loss, nosebleeds and tinnitus.    Eyes: Negative.  Negative for blurred vision, double vision, photophobia and pain.   Respiratory: Positive for shortness of breath. Negative for cough, hemoptysis and sputum production.    Cardiovascular: Negative.  Negative for chest pain and palpitations.   Gastrointestinal: Negative.  Negative for heartburn, nausea and vomiting.   Genitourinary: Negative.  Negative for dysuria, frequency and urgency.   Musculoskeletal: Positive for falls and joint pain. Negative for back pain and myalgias.   Skin: Negative.    Neurological: Positive for tremors and weakness. Negative for dizziness, tingling and headaches.   Endo/Heme/Allergies: Negative.    Psychiatric/Behavioral: Negative.  Negative for depression and suicidal ideas. The patient does not have insomnia.    All other systems reviewed and are negative.        Past Medical History:   Diagnosis Date   ??? COPD (chronic  obstructive pulmonary disease) (HCC)    ??? Depression    ??? Diabetes (HCC)    ??? Emphysema lung (HCC)    ??? Heart disease    ??? Hypercholesterolemia    ??? Parkinson disease (HCC)    ??? Pulmonary embolism [facility])      Past Surgical History:   Procedure Laterality Date   ??? HX COLONOSCOPY     ??? HX HIP REPLACEMENT Right 01/2016   ??? HX IMPLANTABLE CARDIOVERTER DEFIBRILLATOR     ??? HX PACEMAKER     ??? HX PACEMAKER PLACEMENT       Social History     Socioeconomic History   ??? Marital status: UNKNOWN     Spouse name: Not on file   ??? Number of children: Not on file   ??? Years of education: Not on file   ??? Highest education level: Not on file   Tobacco Use   ??? Smoking status: Current Every Day Smoker     Packs/day: 1.00     Years: 45.00     Pack years: 45.00     Types: Cigarettes   ??? Smokeless tobacco: Never Used   Substance and Sexual Activity   ??? Alcohol use: Never     Frequency: Never     Binge frequency: Never   ??? Drug use: Never     No family history on file.  Current Outpatient Medications   Medication Sig Dispense Refill   ??? albuterol (PROVENTIL HFA, VENTOLIN HFA, PROAIR HFA) 90 mcg/actuation inhaler  Take 1 Puff by inhalation every four (4) hours as needed.     ??? alendronate (FOSAMAX) 70 mg tablet Take 1 Tab by mouth every seven (7) days.     ??? amiodarone (CORDARONE) 200 mg tablet Take 1 Tab by mouth daily.     ??? carbidopa-levodopa ER (SINEMET CR) 50-200 mg per tablet Take 2 Tabs by mouth three (3) times daily.     ??? omeprazole (PRILOSEC) 40 mg capsule TAKE ONE CAPSULE BY MOUTH DAILY 30 Cap 0   ??? spironolactone (ALDACTONE) 25 mg tablet TAKE ONE TABLET BY MOUTH DAILY 30 Tab 0   ??? carvediloL (COREG) 6.25 mg tablet TAKE 1 TABLET BY MOUTH TWICE DAILY. 180 Tab 0   ??? ranolazine ER (RANEXA) 500 mg SR tablet TAKE 1 TABLET BY MOUTH TWICE DAILY. 60 Tab 3   ??? Trelegy Ellipta 100-62.5-25 mcg inhaler INHALE 1 PUFF DAILY AS DIRECTED 1 Inhaler 4   ??? fenofibrate nanocrystallized (TRICOR) 145 mg tablet TAKE ONE TABLET BY MOUTH DAILY 90 Tab 1   ???  Eliquis 5 mg tablet TAKE 1 TABLET BY MOUTH TWICE DAILY. 180 Tab 2   ??? vortioxetine (Trintellix) 20 mg tablet Take 1 Tab by mouth daily for 180 days. 90 Tab 1   ??? simvastatin (ZOCOR) 20 mg tablet TAKE ONE TABLET BY MOUTH DAILY. 90 Tab 2     No Known Allergies    Objective:  Visit Vitals  BP (!) 140/70 (BP 1 Location: Right upper arm, BP Patient Position: Sitting, BP Cuff Size: Adult)   Pulse 87   Temp 98 ??F (36.7 ??C) (Temporal)   Resp 20   Ht 5\' 11"  (1.803 m)   Wt 242 lb (109.8 kg)   SpO2 96% Comment: room air   BMI 33.75 kg/m??       Physical Exam:   Physical Exam  Vitals signs and nursing note reviewed.   Constitutional:       General: He is not in acute distress.     Appearance: Normal appearance. He is obese. He is not ill-appearing, toxic-appearing or diaphoretic.   HENT:      Head: Normocephalic and atraumatic.      Right Ear: Tympanic membrane, ear canal and external ear normal. There is no impacted cerumen.      Left Ear: Tympanic membrane, ear canal and external ear normal. There is no impacted cerumen.      Nose: Nose normal. No congestion or rhinorrhea.      Mouth/Throat:      Mouth: Mucous membranes are moist.      Pharynx: Oropharynx is clear. No oropharyngeal exudate or posterior oropharyngeal erythema.   Eyes:      General: No scleral icterus.        Right eye: No discharge.         Left eye: No discharge.      Extraocular Movements: Extraocular movements intact.      Conjunctiva/sclera: Conjunctivae normal.      Pupils: Pupils are equal, round, and reactive to light.   Neck:      Musculoskeletal: Normal range of motion and neck supple. No neck rigidity or muscular tenderness.      Vascular: No carotid bruit.   Cardiovascular:      Rate and Rhythm: Normal rate and regular rhythm.      Pulses: Normal pulses.      Heart sounds: Normal heart sounds. No murmur. No friction rub. No gallop.    Pulmonary:  Effort: Pulmonary effort is normal. No respiratory distress.      Breath sounds: Normal breath  sounds. No stridor. No wheezing, rhonchi or rales.   Chest:      Chest wall: No tenderness.   Abdominal:      General: Abdomen is flat. Bowel sounds are normal. There is no distension.      Palpations: Abdomen is soft. There is no mass.      Tenderness: There is no abdominal tenderness. There is no right CVA tenderness, left CVA tenderness, guarding or rebound.      Hernia: No hernia is present.   Musculoskeletal: Normal range of motion.         General: Tenderness (right humerus with marked decreased abduction) present. No swelling, deformity or signs of injury.      Right lower leg: No edema.      Left lower leg: No edema.   Lymphadenopathy:      Cervical: No cervical adenopathy.   Skin:     General: Skin is warm.      Capillary Refill: Capillary refill takes 2 to 3 seconds.      Coloration: Skin is not jaundiced or pale.      Findings: No bruising, erythema, lesion or rash.   Neurological:      General: No focal deficit present.      Mental Status: He is alert and oriented to person, place, and time. Mental status is at baseline.      Cranial Nerves: No cranial nerve deficit.      Sensory: No sensory deficit.      Motor: No weakness.      Coordination: Coordination normal.      Gait: Gait normal.      Deep Tendon Reflexes: Reflexes normal.   Psychiatric:         Mood and Affect: Mood normal.         Behavior: Behavior normal.         Thought Content: Thought content normal.         Judgment: Judgment normal.             No results found for this or any previous visit.    Assessment/Plan:    ICD-10-CM ICD-9-CM    1. Hypertensive heart disease, unspecified whether heart failure present  I11.9 402.90 REFERRAL TO CARDIOLOGY      METABOLIC PANEL, COMPREHENSIVE      CBC W/O DIFF   2. Mixed hyperlipidemia  E78.2 272.2 LIPID PANEL   3. Gastroesophageal reflux disease with esophagitis without hemorrhage  K21.00 530.81      530.10    4. Parkinson disease (HCC)  G20 332.0 REFERRAL TO NEUROLOGY   5. Pacemaker  Z95.0 V45.01  REFERRAL TO CARDIOLOGY   6. Panlobular emphysema (HCC)  J43.1 492.8    7. Tobacco abuse counseling  Z71.6 V65.42      305.1    8. Tobacco dependency  F17.200 305.1    9. Fall, initial encounter  W19.XXXA E888.9 XR SHOULDER RT AP/LAT MIN 2 V   10. Acute pain of right shoulder  M25.511 719.41 XR SHOULDER RT AP/LAT MIN 2 V     Orders Placed This Encounter   ??? XR SHOULDER RT AP/LAT MIN 2 V     Standing Status:   Future     Standing Expiration Date:   12/10/2019   ??? METABOLIC PANEL, COMPREHENSIVE   ??? LIPID PANEL   ??? CBC W/O DIFF   ??? REFERRAL  TO CARDIOLOGY     Referral Priority:   Routine     Referral Type:   Consultation     Referral Reason:   Specialty Services Required     Referred to Provider:   Charlestine Night, MD     Number of Visits Requested:   1   ??? REFERRAL TO NEUROLOGY     Referral Priority:   Routine     Referral Type:   Consultation     Referral Reason:   Specialty Services Required     Referred to Provider:   Sallee Lange, MD     Number of Visits Requested:   1   ??? albuterol (PROVENTIL HFA, VENTOLIN HFA, PROAIR HFA) 90 mcg/actuation inhaler     Sig: Take 1 Puff by inhalation every four (4) hours as needed.   ??? alendronate (FOSAMAX) 70 mg tablet     Sig: Take 1 Tab by mouth every seven (7) days.   ??? amiodarone (CORDARONE) 200 mg tablet     Sig: Take 1 Tab by mouth daily.   ??? carbidopa-levodopa ER (SINEMET CR) 50-200 mg per tablet     Sig: Take 2 Tabs by mouth three (3) times daily.     Cannot display discharge medications since this is not an admission.      Follow-up and Dispositions    ?? Return in about 3 months (around 02/09/2020).

## 2019-11-09 NOTE — Progress Notes (Signed)
Carlos Petersen is a 69 y.o. male and presents with New Patient, Establish Care, Fall (Patient states he fell yesterdy getting out of the shower and thinks he may have dislocated his right shoulder ), and Dizziness (Patient states he has been having dizziness x2 years. )  .  HPI   69 yo WM with a hx of HTN and CAD here to establish and s/p fall last night from a shower chair with right shoulder displacement and pain that is not worsening  Subjective:  Cardiovascular Review:  The patient has hypertension   Diet and Lifestyle: generally follows a low fat low cholesterol diet, generally follows a low sodium diet, exercises sporadically  Home BP Monitoring: is not measured at home.  Pertinent ROS: taking medications as instructed, no medication side effects noted, no TIA's, no chest pain on exertion, no dyspnea on exertion, no swelling of ankles.     Review of Systems  Review of Systems   Constitutional: Negative.  Negative for chills and fever.   HENT: Negative.  Negative for congestion, ear discharge, hearing loss, nosebleeds and tinnitus.    Eyes: Negative.  Negative for blurred vision, double vision, photophobia and pain.   Respiratory: Positive for shortness of breath. Negative for cough, hemoptysis and sputum production.    Cardiovascular: Negative.  Negative for chest pain and palpitations.   Gastrointestinal: Negative.  Negative for heartburn, nausea and vomiting.   Genitourinary: Negative.  Negative for dysuria, frequency and urgency.   Musculoskeletal: Positive for falls and joint pain. Negative for back pain and myalgias.   Skin: Negative.    Neurological: Positive for tremors and weakness. Negative for dizziness, tingling and headaches.   Endo/Heme/Allergies: Negative.    Psychiatric/Behavioral: Negative.  Negative for depression and suicidal ideas. The patient does not have insomnia.    All other systems reviewed and are negative.        Past Medical History:   Diagnosis Date   ??? COPD (chronic  obstructive pulmonary disease) (HCC)    ??? Depression    ??? Diabetes (HCC)    ??? Emphysema lung (HCC)    ??? Heart disease    ??? Hypercholesterolemia    ??? Parkinson disease (HCC)    ??? Pulmonary embolism [facility])      Past Surgical History:   Procedure Laterality Date   ??? HX COLONOSCOPY     ??? HX HIP REPLACEMENT Right 01/2016   ??? HX IMPLANTABLE CARDIOVERTER DEFIBRILLATOR     ??? HX PACEMAKER     ??? HX PACEMAKER PLACEMENT       Social History     Socioeconomic History   ??? Marital status: UNKNOWN     Spouse name: Not on file   ??? Number of children: Not on file   ??? Years of education: Not on file   ??? Highest education level: Not on file   Tobacco Use   ??? Smoking status: Current Every Day Smoker     Packs/day: 1.00     Years: 45.00     Pack years: 45.00     Types: Cigarettes   ??? Smokeless tobacco: Never Used   Substance and Sexual Activity   ??? Alcohol use: Never     Frequency: Never     Binge frequency: Never   ??? Drug use: Never     No family history on file.  Current Outpatient Medications   Medication Sig Dispense Refill   ??? albuterol (PROVENTIL HFA, VENTOLIN HFA, PROAIR HFA) 90 mcg/actuation inhaler  Take 1 Puff by inhalation every four (4) hours as needed.     ??? alendronate (FOSAMAX) 70 mg tablet Take 1 Tab by mouth every seven (7) days.     ??? amiodarone (CORDARONE) 200 mg tablet Take 1 Tab by mouth daily.     ??? carbidopa-levodopa ER (SINEMET CR) 50-200 mg per tablet Take 2 Tabs by mouth three (3) times daily.     ??? omeprazole (PRILOSEC) 40 mg capsule TAKE ONE CAPSULE BY MOUTH DAILY 30 Cap 0   ??? spironolactone (ALDACTONE) 25 mg tablet TAKE ONE TABLET BY MOUTH DAILY 30 Tab 0   ??? carvediloL (COREG) 6.25 mg tablet TAKE 1 TABLET BY MOUTH TWICE DAILY. 180 Tab 0   ??? ranolazine ER (RANEXA) 500 mg SR tablet TAKE 1 TABLET BY MOUTH TWICE DAILY. 60 Tab 3   ??? Trelegy Ellipta 100-62.5-25 mcg inhaler INHALE 1 PUFF DAILY AS DIRECTED 1 Inhaler 4   ??? fenofibrate nanocrystallized (TRICOR) 145 mg tablet TAKE ONE TABLET BY MOUTH DAILY 90 Tab 1   ???  Eliquis 5 mg tablet TAKE 1 TABLET BY MOUTH TWICE DAILY. 180 Tab 2   ??? vortioxetine (Trintellix) 20 mg tablet Take 1 Tab by mouth daily for 180 days. 90 Tab 1   ??? simvastatin (ZOCOR) 20 mg tablet TAKE ONE TABLET BY MOUTH DAILY. 90 Tab 2     No Known Allergies    Objective:  Visit Vitals  BP (!) 140/70 (BP 1 Location: Right upper arm, BP Patient Position: Sitting, BP Cuff Size: Adult)   Pulse 87   Temp 98 ??F (36.7 ??C) (Temporal)   Resp 20   Ht 5\' 11"  (1.803 m)   Wt 242 lb (109.8 kg)   SpO2 96% Comment: room air   BMI 33.75 kg/m??       Physical Exam:   Physical Exam  Vitals signs and nursing note reviewed.   Constitutional:       General: He is not in acute distress.     Appearance: Normal appearance. He is obese. He is not ill-appearing, toxic-appearing or diaphoretic.   HENT:      Head: Normocephalic and atraumatic.      Right Ear: Tympanic membrane, ear canal and external ear normal. There is no impacted cerumen.      Left Ear: Tympanic membrane, ear canal and external ear normal. There is no impacted cerumen.      Nose: Nose normal. No congestion or rhinorrhea.      Mouth/Throat:      Mouth: Mucous membranes are moist.      Pharynx: Oropharynx is clear. No oropharyngeal exudate or posterior oropharyngeal erythema.   Eyes:      General: No scleral icterus.        Right eye: No discharge.         Left eye: No discharge.      Extraocular Movements: Extraocular movements intact.      Conjunctiva/sclera: Conjunctivae normal.      Pupils: Pupils are equal, round, and reactive to light.   Neck:      Musculoskeletal: Normal range of motion and neck supple. No neck rigidity or muscular tenderness.      Vascular: No carotid bruit.   Cardiovascular:      Rate and Rhythm: Normal rate and regular rhythm.      Pulses: Normal pulses.      Heart sounds: Normal heart sounds. No murmur. No friction rub. No gallop.    Pulmonary:  Effort: Pulmonary effort is normal. No respiratory distress.      Breath sounds: Normal breath  sounds. No stridor. No wheezing, rhonchi or rales.   Chest:      Chest wall: No tenderness.   Abdominal:      General: Abdomen is flat. Bowel sounds are normal. There is no distension.      Palpations: Abdomen is soft. There is no mass.      Tenderness: There is no abdominal tenderness. There is no right CVA tenderness, left CVA tenderness, guarding or rebound.      Hernia: No hernia is present.   Musculoskeletal: Normal range of motion.         General: Tenderness (right humerus with marked decreased abduction) present. No swelling, deformity or signs of injury.      Right lower leg: No edema.      Left lower leg: No edema.   Lymphadenopathy:      Cervical: No cervical adenopathy.   Skin:     General: Skin is warm.      Capillary Refill: Capillary refill takes 2 to 3 seconds.      Coloration: Skin is not jaundiced or pale.      Findings: No bruising, erythema, lesion or rash.   Neurological:      General: No focal deficit present.      Mental Status: He is alert and oriented to person, place, and time. Mental status is at baseline.      Cranial Nerves: No cranial nerve deficit.      Sensory: No sensory deficit.      Motor: No weakness.      Coordination: Coordination normal.      Gait: Gait normal.      Deep Tendon Reflexes: Reflexes normal.   Psychiatric:         Mood and Affect: Mood normal.         Behavior: Behavior normal.         Thought Content: Thought content normal.         Judgment: Judgment normal.             No results found for this or any previous visit.    Assessment/Plan:    ICD-10-CM ICD-9-CM    1. Hypertensive heart disease, unspecified whether heart failure present  I11.9 402.90 REFERRAL TO CARDIOLOGY      METABOLIC PANEL, COMPREHENSIVE      CBC W/O DIFF   2. Mixed hyperlipidemia  E78.2 272.2 LIPID PANEL   3. Gastroesophageal reflux disease with esophagitis without hemorrhage  K21.00 530.81      530.10    4. Parkinson disease (HCC)  G20 332.0 REFERRAL TO NEUROLOGY   5. Pacemaker  Z95.0 V45.01  REFERRAL TO CARDIOLOGY   6. Panlobular emphysema (HCC)  J43.1 492.8    7. Tobacco abuse counseling  Z71.6 V65.42      305.1    8. Tobacco dependency  F17.200 305.1    9. Fall, initial encounter  W19.XXXA E888.9 XR SHOULDER RT AP/LAT MIN 2 V   10. Acute pain of right shoulder  M25.511 719.41 XR SHOULDER RT AP/LAT MIN 2 V     Orders Placed This Encounter   ??? XR SHOULDER RT AP/LAT MIN 2 V     Standing Status:   Future     Standing Expiration Date:   12/10/2019   ??? METABOLIC PANEL, COMPREHENSIVE   ??? LIPID PANEL   ??? CBC W/O DIFF   ??? REFERRAL   TO CARDIOLOGY     Referral Priority:   Routine     Referral Type:   Consultation     Referral Reason:   Specialty Services Required     Referred to Provider:   Charlestine Night, MD     Number of Visits Requested:   1   ??? REFERRAL TO NEUROLOGY     Referral Priority:   Routine     Referral Type:   Consultation     Referral Reason:   Specialty Services Required     Referred to Provider:   Sallee Lange, MD     Number of Visits Requested:   1   ??? albuterol (PROVENTIL HFA, VENTOLIN HFA, PROAIR HFA) 90 mcg/actuation inhaler     Sig: Take 1 Puff by inhalation every four (4) hours as needed.   ??? alendronate (FOSAMAX) 70 mg tablet     Sig: Take 1 Tab by mouth every seven (7) days.   ??? amiodarone (CORDARONE) 200 mg tablet     Sig: Take 1 Tab by mouth daily.   ??? carbidopa-levodopa ER (SINEMET CR) 50-200 mg per tablet     Sig: Take 2 Tabs by mouth three (3) times daily.     Cannot display discharge medications since this is not an admission.      Follow-up and Dispositions    ?? Return in about 3 months (around 02/09/2020).

## 2019-11-26 ENCOUNTER — Encounter

## 2019-11-27 MED ORDER — VORTIOXETINE 20 MG TABLET
20 mg | ORAL_TABLET | Freq: Every day | ORAL | 1 refills | Status: DC
Start: 2019-11-27 — End: 2020-05-06

## 2019-11-27 MED ORDER — SIMVASTATIN 20 MG TAB
20 mg | ORAL_TABLET | ORAL | 0 refills | Status: DC
Start: 2019-11-27 — End: 2020-02-18

## 2019-11-27 MED ORDER — CARVEDILOL 6.25 MG TAB
6.25 mg | ORAL_TABLET | ORAL | 0 refills | Status: DC
Start: 2019-11-27 — End: 2020-02-04

## 2019-11-27 MED ORDER — ALENDRONATE 70 MG TAB
70 mg | ORAL_TABLET | ORAL | 0 refills | Status: DC
Start: 2019-11-27 — End: 2020-01-03

## 2019-11-27 MED ORDER — CARBIDOPA-LEVODOPA ER 50-200 MG TABLET, EXTENDED RELEASE
50-200 mg | ORAL_TABLET | Freq: Three times a day (TID) | ORAL | 1 refills | Status: DC
Start: 2019-11-27 — End: 2020-02-04

## 2019-11-27 MED ORDER — ALBUTEROL SULFATE HFA 90 MCG/ACTUATION AEROSOL INHALER
90 mcg/actuation | RESPIRATORY_TRACT | 1 refills | Status: DC | PRN
Start: 2019-11-27 — End: 2020-01-16

## 2019-11-27 MED ORDER — TRELEGY ELLIPTA 100 MCG-62.5 MCG-25 MCG POWDER FOR INHALATION
RESPIRATORY_TRACT | 4 refills | Status: DC
Start: 2019-11-27 — End: 2020-04-27

## 2019-11-27 MED ORDER — APIXABAN 5 MG TABLET
5 mg | ORAL_TABLET | ORAL | 0 refills | Status: DC
Start: 2019-11-27 — End: 2020-02-10

## 2019-11-27 MED ORDER — SPIRONOLACTONE 25 MG TAB
25 mg | ORAL_TABLET | ORAL | 1 refills | Status: DC
Start: 2019-11-27 — End: 2020-02-04

## 2019-11-27 MED ORDER — RANOLAZINE SR 500 MG 12 HR TAB
500 mg | ORAL_TABLET | ORAL | 0 refills | Status: DC
Start: 2019-11-27 — End: 2020-02-10

## 2019-11-27 MED ORDER — AMIODARONE 200 MG TAB
200 mg | ORAL_TABLET | Freq: Every day | ORAL | 0 refills | Status: DC
Start: 2019-11-27 — End: 2020-02-07

## 2019-11-27 MED ORDER — OMEPRAZOLE 40 MG CAP, DELAYED RELEASE
40 mg | ORAL_CAPSULE | ORAL | 0 refills | Status: DC
Start: 2019-11-27 — End: 2020-01-09

## 2019-11-27 MED ORDER — FENOFIBRATE NANOCRYSTALLIZED 145 MG TAB
145 mg | ORAL_TABLET | ORAL | 1 refills | Status: DC
Start: 2019-11-27 — End: 2020-04-27

## 2019-11-27 NOTE — Telephone Encounter (Addendum)
Spoke with stephanie at [facility] for last med pick dates and informed her to cancel all refills due to pt transfer to different pharmacy     Last time med was refilled     Albuterol: 11/26/19  Fosamax 09/10/19   Amiodarone 11/10/19   Carbidoba: 11/10/19   Omeprazole 2/22/2  sprinolactone 10/22/19  Coreg 08/20/20  Ranexa: 11/26/19  Eliquis: 11/26/19  Trellegy: 11/05/19  trintellix 11/10/19  simvastatiin 11/26/19  Fenofibrate" ?     PCP: Lawrence Santiago, MD    Last appt: 11/09/2019  Future Appointments   Date Time Provider Department Center   02/08/2020 10:00 AM Lawrence Santiago, MD [facility]       Requested Prescriptions     Pending Prescriptions Disp Refills   ??? albuterol (PROVENTIL HFA, VENTOLIN HFA, PROAIR HFA) 90 mcg/actuation inhaler 3 Inhaler 1     Sig: Take 1 Puff by inhalation every four (4) hours as needed (Every 4 hours as needed).   ??? alendronate (FOSAMAX) 70 mg tablet 12 Tab 0     Sig: Take 1 Tab by mouth every seven (7) days.   ??? amiodarone (CORDARONE) 200 mg tablet 90 Tab 0     Sig: Take 1 Tab by mouth daily.   ??? carbidopa-levodopa ER (SINEMET CR) 50-200 mg per tablet 270 Tab 1     Sig: Take 2 Tabs by mouth three (3) times daily.   ??? omeprazole (PRILOSEC) 40 mg capsule 90 Cap 0     Sig: TAKE ONE CAPSULE BY MOUTH DAILY   ??? ranolazine ER (RANEXA) 500 mg SR tablet 180 Tab 0     Sig: TAKE 1 TABLET BY MOUTH TWICE DAILY.   ??? simvastatin (ZOCOR) 20 mg tablet 90 Tab 0     Sig: TAKE ONE TABLET BY MOUTH DAILY.   ??? apixaban (Eliquis) 5 mg tablet 180 Tab 0     Sig: TAKE 1 TABLET BY MOUTH TWICE DAILY.   ??? spironolactone (ALDACTONE) 25 mg tablet 90 Tab 1     Sig: TAKE ONE TABLET BY MOUTH DAILY   ??? carvediloL (COREG) 6.25 mg tablet 180 Tab 0     Sig: TAKE 1 TABLET BY MOUTH TWICE DAILY.   ??? fenofibrate nanocrystallized (TRICOR) 145 mg tablet 90 Tab 1     Sig: TAKE ONE TABLET BY MOUTH DAILY   ??? fluticasone-umeclidinium-vilanterol (Trelegy Ellipta) 100-62.5-25 mcg inhaler 1 Inhaler 4     Sig: INHALE 1 PUFF DAILY AS  DIRECTED   ??? vortioxetine (Trintellix) 20 mg tablet 90 Tab 1     Sig: Take 1 Tab by mouth daily for 180 days.

## 2020-01-03 MED ORDER — ALENDRONATE 70 MG TAB
70 mg | ORAL_TABLET | ORAL | 0 refills | Status: DC
Start: 2020-01-03 — End: 2020-03-21

## 2020-01-09 MED ORDER — OMEPRAZOLE 40 MG CAP, DELAYED RELEASE
40 mg | ORAL_CAPSULE | ORAL | 0 refills | Status: DC
Start: 2020-01-09 — End: 2020-05-06

## 2020-01-16 MED ORDER — ALBUTEROL SULFATE HFA 90 MCG/ACTUATION AEROSOL INHALER
90 mcg/actuation | RESPIRATORY_TRACT | 0 refills | Status: DC
Start: 2020-01-16 — End: 2020-05-06

## 2020-02-04 MED ORDER — CARVEDILOL 6.25 MG TAB
6.25 mg | ORAL_TABLET | ORAL | 0 refills | Status: DC
Start: 2020-02-04 — End: 2020-04-17

## 2020-02-04 MED ORDER — SPIRONOLACTONE 25 MG TAB
25 mg | ORAL_TABLET | ORAL | 0 refills | Status: DC
Start: 2020-02-04 — End: 2020-04-18

## 2020-02-04 MED ORDER — CARBIDOPA-LEVODOPA ER 50-200 MG TABLET, EXTENDED RELEASE
50-200 mg | ORAL_TABLET | ORAL | 0 refills | Status: DC
Start: 2020-02-04 — End: 2020-04-18

## 2020-02-07 MED ORDER — AMIODARONE 200 MG TAB
200 mg | ORAL_TABLET | ORAL | 0 refills | Status: DC
Start: 2020-02-07 — End: 2020-05-06

## 2020-02-08 ENCOUNTER — Encounter: Payer: MEDICARE | Attending: Family Medicine | Primary: Family Medicine

## 2020-02-10 MED ORDER — APIXABAN 5 MG TABLET
5 mg | ORAL_TABLET | ORAL | 0 refills | Status: DC
Start: 2020-02-10 — End: 2020-05-06

## 2020-02-10 MED ORDER — RANOLAZINE SR 500 MG 12 HR TAB
500 mg | ORAL_TABLET | ORAL | 0 refills | Status: DC
Start: 2020-02-10 — End: 2020-05-06

## 2020-02-18 MED ORDER — SIMVASTATIN 20 MG TAB
20 mg | ORAL_TABLET | ORAL | 0 refills | Status: DC
Start: 2020-02-18 — End: 2020-05-06

## 2020-03-21 MED ORDER — ALENDRONATE 70 MG TAB
70 mg | ORAL_TABLET | ORAL | 0 refills | Status: DC
Start: 2020-03-21 — End: 2020-05-06

## 2020-03-21 NOTE — Telephone Encounter (Signed)
Left a msg to call and scheduls an appointment

## 2020-04-17 MED ORDER — CARVEDILOL 6.25 MG TAB
6.25 mg | ORAL_TABLET | ORAL | 0 refills | Status: DC
Start: 2020-04-17 — End: 2020-08-09

## 2020-04-18 ENCOUNTER — Encounter: Payer: MEDICARE | Attending: Family Medicine | Primary: Family Medicine

## 2020-04-18 MED ORDER — CARBIDOPA-LEVODOPA ER 50-200 MG TABLET, EXTENDED RELEASE
50-200 mg | ORAL_TABLET | ORAL | 0 refills | Status: DC
Start: 2020-04-18 — End: 2020-05-06

## 2020-04-18 MED ORDER — SPIRONOLACTONE 25 MG TAB
25 mg | ORAL_TABLET | ORAL | 0 refills | Status: DC
Start: 2020-04-18 — End: 2020-05-06

## 2020-04-27 MED ORDER — TRELEGY ELLIPTA 100 MCG-62.5 MCG-25 MCG POWDER FOR INHALATION
RESPIRATORY_TRACT | 0 refills | Status: DC
Start: 2020-04-27 — End: 2020-06-23

## 2020-04-27 MED ORDER — FENOFIBRATE NANOCRYSTALLIZED 145 MG TAB
145 mg | ORAL_TABLET | ORAL | 0 refills | Status: DC
Start: 2020-04-27 — End: 2020-07-15

## 2020-05-06 ENCOUNTER — Ambulatory Visit: Admit: 2020-05-06 | Discharge: 2020-05-06 | Payer: MEDICARE | Attending: Family Medicine | Primary: Family Medicine

## 2020-05-06 ENCOUNTER — Ambulatory Visit: Attending: Family Medicine | Primary: Family Medicine

## 2020-05-06 DIAGNOSIS — K2100 Gastro-esophageal reflux disease with esophagitis, without bleeding: Secondary | ICD-10-CM

## 2020-05-06 MED ORDER — APIXABAN 5 MG TABLET
5 mg | ORAL_TABLET | ORAL | 0 refills | Status: DC
Start: 2020-05-06 — End: 2021-01-13

## 2020-05-06 MED ORDER — SPIRONOLACTONE 25 MG TAB
25 mg | ORAL_TABLET | Freq: Every day | ORAL | 0 refills | Status: AC
Start: 2020-05-06 — End: 2020-08-04

## 2020-05-06 MED ORDER — OMEPRAZOLE 40 MG CAP, DELAYED RELEASE
40 mg | ORAL_CAPSULE | ORAL | 0 refills | Status: DC
Start: 2020-05-06 — End: 2020-08-08

## 2020-05-06 MED ORDER — VORTIOXETINE 20 MG TABLET
20 mg | ORAL_TABLET | Freq: Every day | ORAL | 0 refills | Status: DC
Start: 2020-05-06 — End: 2020-07-15

## 2020-05-06 MED ORDER — PNEUMOCOCCAL 13-VAL CONJ VACCINE-DIP CRM (PF) 0.5 ML IM SYRINGE
0.5 mL | Freq: Once | INTRAMUSCULAR | 0 refills | Status: DC
Start: 2020-05-06 — End: 2020-05-06

## 2020-05-06 MED ORDER — SIMVASTATIN 20 MG TAB
20 mg | ORAL_TABLET | ORAL | 0 refills | Status: DC
Start: 2020-05-06 — End: 2020-10-03

## 2020-05-06 MED ORDER — ALBUTEROL SULFATE HFA 90 MCG/ACTUATION AEROSOL INHALER
90 mcg/actuation | Freq: Four times a day (QID) | RESPIRATORY_TRACT | 0 refills | Status: DC | PRN
Start: 2020-05-06 — End: 2020-08-03

## 2020-05-06 MED ORDER — AMIODARONE 200 MG TAB
200 mg | ORAL_TABLET | Freq: Every day | ORAL | 0 refills | Status: DC
Start: 2020-05-06 — End: 2020-08-09

## 2020-05-06 MED ORDER — CARBIDOPA-LEVODOPA ER 50-200 MG TABLET, EXTENDED RELEASE
50-200 mg | ORAL_TABLET | Freq: Three times a day (TID) | ORAL | 0 refills | Status: DC
Start: 2020-05-06 — End: 2020-09-05

## 2020-05-06 MED ORDER — RANOLAZINE SR 500 MG 12 HR TAB
500 mg | ORAL_TABLET | Freq: Two times a day (BID) | ORAL | 0 refills | Status: DC
Start: 2020-05-06 — End: 2020-08-08

## 2020-05-06 MED ORDER — ALENDRONATE 70 MG TAB
70 mg | ORAL_TABLET | ORAL | 0 refills | Status: AC
Start: 2020-05-06 — End: 2020-08-04

## 2020-05-06 NOTE — Progress Notes (Signed)
 Chief Complaint   Patient presents with   . Follow Up Chronic Condition   . Hypertension   . Medication Refill     1. Have you been to the ER, urgent care clinic since your last visit?  Hospitalized since your last visit?No    2. Have you seen or consulted any other health care providers outside of the [facility] since your last visit?  Include any pap smears or colon screening. No     Visit Vitals  BP 138/82 (BP 1 Location: Right upper arm, BP Patient Position: Sitting, BP Cuff Size: Adult)   Pulse 81   Temp 97.3 F (36.3 C) (Temporal)   Resp 20   Ht 5' 11 (1.803 m)   Wt 239 lb 12.8 oz (108.8 kg)   SpO2 96% Comment: room air   BMI 33.45 kg/m

## 2020-05-06 NOTE — Addendum Note (Signed)
Addendum Note by Lawrence Santiago, MD at 05/06/20 1145                Author: Lawrence Santiago, MD  Service: --  Author Type: Physician       Filed: 05/06/20 1215  Encounter Date: 05/06/2020  Status: Signed          Editor: Lawrence Santiago, MD (Physician)          Addended by: Lawrence Santiago on: 05/06/2020 12:15 PM    Modules accepted: Orders

## 2020-05-06 NOTE — Progress Notes (Signed)
Carlos SosDavid B Petersen is a 69 y.o. male and presents with Follow Up Chronic Condition, Hypertension, and Medication Refill  .  HPI     Subjective:  Cardiovascular Review:  The patient has hypertension   Diet and Lifestyle: generally follows a low fat low cholesterol diet, generally follows a low sodium diet, exercises sporadically  Home BP Monitoring: is not measured at home.  Pertinent ROS: taking medications as instructed, no medication side effects noted, no TIA's, no chest pain on exertion, no dyspnea on exertion, no swelling of ankles.     Review of Systems  Review of Systems   Constitutional: Negative.  Negative for chills and fever.   HENT: Negative.  Negative for congestion, ear discharge, hearing loss, nosebleeds and tinnitus.    Eyes: Negative.  Negative for blurred vision, double vision, photophobia and pain.   Respiratory: Negative.  Negative for cough, hemoptysis and sputum production.    Cardiovascular: Negative.  Negative for chest pain and palpitations.   Gastrointestinal: Negative.  Negative for heartburn, nausea and vomiting.   Genitourinary: Negative.  Negative for dysuria, frequency and urgency.   Musculoskeletal: Negative.  Negative for back pain and myalgias.   Skin: Negative.    Neurological: Negative.  Negative for dizziness, tingling, weakness and headaches.   Endo/Heme/Allergies: Negative.    Psychiatric/Behavioral: Negative.  Negative for depression and suicidal ideas. The patient does not have insomnia.    All other systems reviewed and are negative.        Past Medical History:   Diagnosis Date   ??? COPD (chronic obstructive pulmonary disease) (HCC)    ??? Depression    ??? Diabetes (HCC)    ??? Emphysema lung (HCC)    ??? Heart disease    ??? Hypercholesterolemia    ??? Parkinson disease (HCC)    ??? Pulmonary embolism [facility](HCC)      Past Surgical History:   Procedure Laterality Date   ??? HX COLONOSCOPY     ??? HX HIP REPLACEMENT Right 01/2016   ??? HX IMPLANTABLE CARDIOVERTER DEFIBRILLATOR     ??? HX PACEMAKER      ??? HX PACEMAKER PLACEMENT       Social History     Socioeconomic History   ??? Marital status: UNKNOWN     Spouse name: Not on file   ??? Number of children: Not on file   ??? Years of education: Not on file   ??? Highest education level: Not on file   Tobacco Use   ??? Smoking status: Current Every Day Smoker     Packs/day: 1.00     Years: 45.00     Pack years: 45.00     Types: Cigarettes   ??? Smokeless tobacco: Never Used   Substance and Sexual Activity   ??? Alcohol use: Never   ??? Drug use: Never     Social Determinants of Psychologist, prison and probation servicesHealth     Financial Resource Strain:    ??? Difficulty of Paying Living Expenses:    Food Insecurity:    ??? Worried About Programme researcher, broadcasting/film/videounning Out of Food in the Last Year:    ??? Baristaan Out of Food in the Last Year:    Transportation Needs:    ??? Freight forwarderLack of Transportation (Medical):    ??? Lack of Transportation (Non-Medical):    Physical Activity:    ??? Days of Exercise per Week:    ??? Minutes of Exercise per Session:    Stress:    ??? Feeling of Stress :  Social Connections:    ??? Frequency of Communication with Friends and Family:    ??? Frequency of Social Gatherings with Friends and Family:    ??? Attends Religious Services:    ??? Database administrator or Organizations:    ??? Attends Banker Meetings:    ??? Marital Status:      No family history on file.  Current Outpatient Medications   Medication Sig Dispense Refill   ??? alendronate (FOSAMAX) 70 mg tablet Take 1 Tablet by mouth every seven (7) days for 90 days. 12 Tablet 0   ??? amiodarone (CORDARONE) 200 mg tablet Take 1 Tablet by mouth daily. 90 Tablet 0   ??? apixaban (Eliquis) 5 mg tablet Take 1 tablet by mouth twice daily. 180 Tablet 0   ??? carbidopa-levodopa ER (SINEMET CR) 50-200 mg per tablet Take 2 Tablets by mouth three (3) times daily for 90 days. 540 Tablet 0   ??? albuterol (PROVENTIL HFA, VENTOLIN HFA, PROAIR HFA) 90 mcg/actuation inhaler Take 2 Puffs by inhalation every six (6) hours as needed for Wheezing. 18 g 0   ??? spironolactone (ALDACTONE) 25 mg tablet Take  1 Tablet by mouth daily for 90 days. 90 Tablet 0   ??? simvastatin (ZOCOR) 20 mg tablet Take 1 tablet by mouth daily. 90 Tablet 0   ??? ranolazine ER (RANEXA) 500 mg SR tablet Take 1 Tablet by mouth two (2) times a day. 180 Tablet 0   ??? omeprazole (PRILOSEC) 40 mg capsule Take 1 capsule by mouth daily. 90 Capsule 0   ??? fluticasone-umeclidinium-vilanterol (Trelegy Ellipta) 100-62.5-25 mcg inhaler Inhale 1 puff by mouth daily as directed. 1 Inhaler 0   ??? fenofibrate nanocrystallized (TRICOR) 145 mg tablet Take 1 tablet by mouth daily. 90 Tablet 0   ??? carvediloL (COREG) 6.25 mg tablet Take 1 tablet by mouth twice daily. 180 Tablet 0   ??? vortioxetine (Trintellix) 20 mg tablet Take 1 Tab by mouth daily for 180 days. 90 Tab 1     No Known Allergies    Objective:  Visit Vitals  BP 138/82 (BP 1 Location: Right upper arm, BP Patient Position: Sitting, BP Cuff Size: Adult)   Pulse 81   Temp 97.3 ??F (36.3 ??C) (Temporal)   Resp 20   Ht 5\' 11"  (1.803 m)   Wt 239 lb 12.8 oz (108.8 kg)   SpO2 96% Comment: room air   BMI 33.45 kg/m??       Physical Exam:   Physical Exam  Vitals and nursing note reviewed.   Constitutional:       General: He is not in acute distress.     Appearance: Normal appearance. He is obese. He is not ill-appearing, toxic-appearing or diaphoretic.   HENT:      Head: Normocephalic and atraumatic.      Right Ear: Tympanic membrane, ear canal and external ear normal. There is no impacted cerumen.      Left Ear: Tympanic membrane, ear canal and external ear normal. There is no impacted cerumen.      Nose: Nose normal. No congestion or rhinorrhea.      Mouth/Throat:      Mouth: Mucous membranes are moist.      Pharynx: Oropharynx is clear. No oropharyngeal exudate or posterior oropharyngeal erythema.   Eyes:      General: No scleral icterus.        Right eye: No discharge.         Left eye: No  discharge.      Extraocular Movements: Extraocular movements intact.      Conjunctiva/sclera: Conjunctivae normal.      Pupils:  Pupils are equal, round, and reactive to light.   Neck:      Vascular: No carotid bruit.   Cardiovascular:      Rate and Rhythm: Normal rate. Rhythm irregular.      Pulses: Normal pulses.      Heart sounds: Normal heart sounds. No murmur heard.   No friction rub. No gallop.    Pulmonary:      Effort: Pulmonary effort is normal. No respiratory distress.      Breath sounds: Normal breath sounds. No stridor. No wheezing, rhonchi or rales.   Chest:      Chest wall: No tenderness.   Abdominal:      General: Abdomen is flat. Bowel sounds are normal. There is no distension.      Palpations: Abdomen is soft. There is no mass.      Tenderness: There is no abdominal tenderness. There is no right CVA tenderness, left CVA tenderness, guarding or rebound.      Hernia: No hernia is present.   Musculoskeletal:         General: No swelling, tenderness, deformity or signs of injury. Normal range of motion.      Cervical back: Normal range of motion and neck supple. No rigidity. No muscular tenderness.      Right lower leg: No edema.      Left lower leg: No edema.   Lymphadenopathy:      Cervical: No cervical adenopathy.   Skin:     General: Skin is warm.      Capillary Refill: Capillary refill takes 2 to 3 seconds.      Coloration: Skin is not jaundiced or pale.      Findings: No bruising, erythema, lesion or rash.   Neurological:      General: No focal deficit present.      Mental Status: He is alert and oriented to person, place, and time. Mental status is at baseline.      Cranial Nerves: No cranial nerve deficit.      Sensory: No sensory deficit.      Motor: No weakness.      Coordination: Coordination normal.      Gait: Gait normal.      Deep Tendon Reflexes: Reflexes normal.   Psychiatric:         Mood and Affect: Mood normal.         Behavior: Behavior normal.         Thought Content: Thought content normal.         Judgment: Judgment normal.             No results found for this or any previous visit.    Assessment/Plan:     ICD-10-CM ICD-9-CM    1. Gastroesophageal reflux disease with esophagitis without hemorrhage  K21.00 530.81      530.10    2. Depression, unspecified depression type  F32.9 311    3. Parkinson disease (HCC)  G20 332.0 REFERRAL TO NEUROLOGY   4. Mixed hyperlipidemia  E78.2 272.2    5. Tobacco dependency  F17.200 305.1    6. Tobacco abuse counseling  Z71.6 V65.42      305.1    7. Implantable cardioverter-defibrillator (ICD) in situ  Z95.810 V45.02 REFERRAL TO CARDIOLOGY   8. Pacemaker  Z95.0 V45.01 REFERRAL TO CARDIOLOGY  9. Permanent atrial fibrillation (HCC)  I48.21 427.31 REFERRAL TO CARDIOLOGY     Orders Placed This Encounter   ??? [facility]     Referral Priority:   Routine     Referral Type:   Consultation     Referral Reason:   Specialty Services Required     Referred to Provider:   Nelida Meuse, MD     Number of Visits Requested:   1   ??? [facility] Cardiovascular Disease [facility]     Referral Priority:   Routine     Referral Type:   Consultation     Referral Reason:   Specialty Services Required     Referred to Provider:   Namon Cirri, MD     Number of Visits Requested:   1   ??? alendronate (FOSAMAX) 70 mg tablet     Sig: Take 1 Tablet by mouth every seven (7) days for 90 days.     Dispense:  12 Tablet     Refill:  0     Patient needs appointment   ??? amiodarone (CORDARONE) 200 mg tablet     Sig: Take 1 Tablet by mouth daily.     Dispense:  90 Tablet     Refill:  0   ??? apixaban (Eliquis) 5 mg tablet     Sig: Take 1 tablet by mouth twice daily.     Dispense:  180 Tablet     Refill:  0   ??? carbidopa-levodopa ER (SINEMET CR) 50-200 mg per tablet     Sig: Take 2 Tablets by mouth three (3) times daily for 90 days.     Dispense:  540 Tablet     Refill:  0     To be filled by Neurologist   ??? albuterol (PROVENTIL HFA, VENTOLIN HFA, PROAIR HFA) 90 mcg/actuation inhaler     Sig: Take 2 Puffs by inhalation every six (6) hours as needed for Wheezing.     Dispense:  18 g     Refill:  0   ???  spironolactone (ALDACTONE) 25 mg tablet     Sig: Take 1 Tablet by mouth daily for 90 days.     Dispense:  90 Tablet     Refill:  0   ??? simvastatin (ZOCOR) 20 mg tablet     Sig: Take 1 tablet by mouth daily.     Dispense:  90 Tablet     Refill:  0   ??? ranolazine ER (RANEXA) 500 mg SR tablet     Sig: Take 1 Tablet by mouth two (2) times a day.     Dispense:  180 Tablet     Refill:  0   ??? omeprazole (PRILOSEC) 40 mg capsule     Sig: Take 1 capsule by mouth daily.     Dispense:  90 Capsule     Refill:  0     Cannot display discharge medications since this is not an admission.      Follow-up and Dispositions    ?? Return in about 3 months (around 08/05/2020).

## 2020-05-06 NOTE — Addendum Note (Signed)
Addendum  Note by Annamary Carolin at 05/06/20 1145                Author: Annamary Carolin  Service: --  Author Type: Licensed Nurse       Filed: 05/06/20 1226  Encounter Date: 05/06/2020  Status: Signed          Editor: Annamary Carolin (Licensed Nurse)          Addended by: Annamary Carolin on: 05/06/2020 12:26 PM    Modules accepted: Orders

## 2020-05-15 ENCOUNTER — Encounter

## 2020-05-20 ENCOUNTER — Encounter

## 2020-05-20 NOTE — Telephone Encounter (Signed)
Patient has an appointment with [facility] tomorrow but will need a referral from you the cardiologist you referred him to was unable to help him so the son called and he has an appointment with Dr Barnie Alderman

## 2020-05-22 NOTE — Telephone Encounter (Signed)
Patient requesting prescription for nicotine patches

## 2020-06-23 MED ORDER — TRELEGY ELLIPTA 100 MCG-62.5 MCG-25 MCG POWDER FOR INHALATION
RESPIRATORY_TRACT | 1 refills | Status: DC
Start: 2020-06-23 — End: 2020-09-05

## 2020-07-10 ENCOUNTER — Encounter

## 2020-07-15 ENCOUNTER — Encounter

## 2020-07-15 MED ORDER — VORTIOXETINE 20 MG TABLET
20 mg | ORAL_TABLET | Freq: Every day | ORAL | 0 refills | Status: DC
Start: 2020-07-15 — End: 2020-10-10

## 2020-07-15 MED ORDER — FENOFIBRATE NANOCRYSTALLIZED 145 MG TAB
145 mg | ORAL_TABLET | ORAL | 0 refills | Status: DC
Start: 2020-07-15 — End: 2021-03-17

## 2020-07-15 NOTE — Telephone Encounter (Signed)
Patient is needing a refill on fenofibrate 145 and trintellix 20 mg

## 2020-07-29 ENCOUNTER — Encounter: Payer: MEDICARE | Attending: Family Medicine | Primary: Family Medicine

## 2020-08-03 MED ORDER — ALBUTEROL SULFATE HFA 90 MCG/ACTUATION AEROSOL INHALER
90 mcg/actuation | RESPIRATORY_TRACT | 4 refills | Status: DC
Start: 2020-08-03 — End: 2020-10-27

## 2020-08-08 MED ORDER — RANOLAZINE SR 500 MG 12 HR TAB
500 mg | ORAL_TABLET | ORAL | 1 refills | Status: DC
Start: 2020-08-08 — End: 2020-11-04

## 2020-08-08 MED ORDER — SPIRONOLACTONE 25 MG TAB
25 mg | ORAL_TABLET | ORAL | 0 refills | Status: DC
Start: 2020-08-08 — End: 2020-12-22

## 2020-08-08 MED ORDER — ALENDRONATE 70 MG TAB
70 mg | ORAL_TABLET | ORAL | 0 refills | Status: DC
Start: 2020-08-08 — End: 2020-10-03

## 2020-08-08 MED ORDER — OMEPRAZOLE 40 MG CAP, DELAYED RELEASE
40 mg | ORAL_CAPSULE | ORAL | 0 refills | Status: DC
Start: 2020-08-08 — End: 2020-11-04

## 2020-08-09 MED ORDER — CARVEDILOL 6.25 MG TAB
6.25 mg | ORAL_TABLET | ORAL | 0 refills | Status: DC
Start: 2020-08-09 — End: 2020-12-29

## 2020-08-09 MED ORDER — AMIODARONE 200 MG TAB
200 mg | ORAL_TABLET | Freq: Every day | ORAL | 0 refills | Status: DC
Start: 2020-08-09 — End: 2020-11-04

## 2020-08-18 NOTE — Telephone Encounter (Signed)
Dr. Dorthea Cove, The insurance will not pay for Trelegy ellipta 100-62.5-25 mcg/inh aerosol powder. Can you prescribe something else.

## 2020-08-25 NOTE — Telephone Encounter (Signed)
Left msg for patient to call an schedule an appointment

## 2020-09-05 ENCOUNTER — Telehealth: Admit: 2020-09-05 | Discharge: 2020-09-05 | Payer: MEDICARE | Attending: Family Medicine | Primary: Family Medicine

## 2020-09-05 ENCOUNTER — Telehealth: Attending: Family Medicine | Primary: Family Medicine

## 2020-09-05 DIAGNOSIS — K2100 Gastro-esophageal reflux disease with esophagitis, without bleeding: Secondary | ICD-10-CM

## 2020-09-05 MED ORDER — TRELEGY ELLIPTA 100 MCG-62.5 MCG-25 MCG POWDER FOR INHALATION
RESPIRATORY_TRACT | 2 refills | Status: DC
Start: 2020-09-05 — End: 2020-12-05

## 2020-09-05 MED ORDER — CARBIDOPA-LEVODOPA ER 50-200 MG TABLET, EXTENDED RELEASE
50-200 mg | ORAL_TABLET | ORAL | 0 refills | Status: DC
Start: 2020-09-05 — End: 2020-10-10

## 2020-09-05 NOTE — Progress Notes (Signed)
No note

## 2020-09-05 NOTE — Progress Notes (Signed)
Carlos Petersen is a 70 y.o. male and presents with No chief complaint on file.  [name]  HPI     Subjective:  Cardiovascular Review:  The patient has hypertension   Diet and Lifestyle: generally follows a low fat low cholesterol diet, generally follows a low sodium diet, exercises sporadically  Home BP Monitoring: is not measured at home.  Pertinent ROS: taking medications as instructed, no medication side effects noted, no TIA's, no chest pain on exertion, no dyspnea on exertion, no swelling of ankles.     Review of Systems  Review of Systems   Constitutional: Negative.  Negative for chills and fever.   HENT: Negative.  Negative for congestion, ear discharge, hearing loss, nosebleeds and tinnitus.    Eyes: Negative.  Negative for blurred vision, double vision, photophobia and pain.   Respiratory: Negative.  Negative for cough, hemoptysis and sputum production.    Cardiovascular: Negative.  Negative for chest pain and palpitations.   Gastrointestinal: Negative.  Negative for heartburn, nausea and vomiting.   Genitourinary: Negative.  Negative for dysuria, frequency and urgency.   Musculoskeletal: Negative.  Negative for back pain and myalgias.   Skin: Negative.    Neurological: Negative.  Negative for dizziness, tingling, weakness and headaches.   Endo/Heme/Allergies: Negative.    Psychiatric/Behavioral: Negative.  Negative for depression and suicidal ideas. The patient does not have insomnia.          Past Medical History:   Diagnosis Date   ??? COPD (chronic obstructive pulmonary disease) (HCC)    ??? Depression    ??? Diabetes (HCC)    ??? Emphysema lung (HCC)    ??? Heart disease    ??? Hypercholesterolemia    ??? Parkinson disease (HCC)    ??? Pulmonary embolism [facility])      Past Surgical History:   Procedure Laterality Date   ??? HX COLONOSCOPY     ??? HX HIP REPLACEMENT Right 01/2016   ??? HX IMPLANTABLE CARDIOVERTER DEFIBRILLATOR     ??? HX PACEMAKER     ??? HX PACEMAKER PLACEMENT       Social History     Socioeconomic History   ???  Marital status: UNKNOWN   Tobacco Use   ??? Smoking status: Current Every Day Smoker     Packs/day: 1.00     Years: 45.00     Pack years: 45.00     Types: Cigarettes   ??? Smokeless tobacco: Never Used   Substance and Sexual Activity   ??? Alcohol use: Never   ??? Drug use: Never     No family history on file.  Current Outpatient Medications   Medication Sig Dispense Refill   ??? amiodarone (CORDARONE) 200 mg tablet Take 1 tablet by mouth daily. 30 Tablet 0   ??? carvediloL (COREG) 6.25 mg tablet Take 1 tablet by mouth twice daily. 60 Tablet 0   ??? alendronate (FOSAMAX) 70 mg tablet Take 1 tablet by mouth every 7 days. 12 Tablet 0   ??? omeprazole (PRILOSEC) 40 mg capsule Take 1 capsule by mouth daily. 90 Capsule 0   ??? spironolactone (ALDACTONE) 25 mg tablet Take 1 tablet by mouth daily. 90 Tablet 0   ??? ranolazine ER (RANEXA) 500 mg SR tablet Take 1 tablet by mouth twice daily. 60 Tablet 1   ??? albuterol (PROVENTIL HFA, VENTOLIN HFA, PROAIR HFA) 90 mcg/actuation inhaler INHALE 2 PUFFS EVERY SIX HOURS AS NEEDED 18 g 4   ??? fenofibrate nanocrystallized (TRICOR) 145 mg tablet Take  1 tablet by mouth daily. 90 Tablet 0   ??? vortioxetine (Trintellix) 20 mg tablet Take 1 Tablet by mouth daily for 90 days. 90 Tablet 0   ??? fluticasone-umeclidinium-vilanterol (Trelegy Ellipta) 100-62.5-25 mcg inhaler Inhale 1 puff by mouth daily as directed. 1 Each 1   ??? apixaban (Eliquis) 5 mg tablet Take 1 tablet by mouth twice daily. 180 Tablet 0   ??? simvastatin (ZOCOR) 20 mg tablet Take 1 tablet by mouth daily. 90 Tablet 0     No Known Allergies    Objective:  There were no vitals taken for this visit.    Physical Exam:   Physical Exam  Vitals and nursing note reviewed.   Constitutional:       Appearance: Normal appearance. He is obese.   HENT:      Head: Normocephalic and atraumatic.      Right Ear: External ear normal.      Left Ear: External ear normal.      Nose: Nose normal.      Mouth/Throat:      Mouth: Mucous membranes are moist.      Pharynx:  Oropharynx is clear. No oropharyngeal exudate or posterior oropharyngeal erythema.   Eyes:      General: No scleral icterus.        Right eye: No discharge.         Left eye: No discharge.      Extraocular Movements: Extraocular movements intact.      Conjunctiva/sclera: Conjunctivae normal.   Neck:      Vascular: No carotid bruit.   Cardiovascular:      Heart sounds: No murmur heard.  No gallop.    Pulmonary:      Effort: Pulmonary effort is normal. No respiratory distress.      Breath sounds: No stridor. No wheezing, rhonchi or rales.   Chest:      Chest wall: No tenderness.   Abdominal:      General: There is no distension.      Palpations: Abdomen is soft. There is no mass.      Tenderness: There is no abdominal tenderness. There is no right CVA tenderness, left CVA tenderness or rebound.      Hernia: No hernia is present.   Musculoskeletal:         General: No swelling, tenderness, deformity or signs of injury. Normal range of motion.      Cervical back: Normal range of motion and neck supple. No rigidity. No muscular tenderness.      Right lower leg: No edema.      Left lower leg: No edema.   Lymphadenopathy:      Cervical: No cervical adenopathy.   Skin:     General: Skin is warm.      Capillary Refill: Capillary refill takes 2 to 3 seconds.      Coloration: Skin is not jaundiced or pale.      Findings: No bruising, erythema, lesion or rash.   Neurological:      General: No focal deficit present.      Mental Status: He is alert and oriented to person, place, and time.      Cranial Nerves: No cranial nerve deficit.      Sensory: No sensory deficit.      Motor: No weakness.      Coordination: Coordination normal.      Gait: Gait normal.      Deep Tendon Reflexes: Reflexes normal.   Psychiatric:  Mood and Affect: Mood normal.         Behavior: Behavior normal.         Thought Content: Thought content normal.         Judgment: Judgment normal.             No results found for this or any previous  visit.    Assessment/Plan:    ICD-10-CM ICD-9-CM    1. Gastroesophageal reflux disease with esophagitis without hemorrhage  K21.00 530.81      530.10    2. Permanent atrial fibrillation (HCC)  I48.21 427.31    3. Hypertensive heart disease, unspecified whether heart failure present  I11.9 402.90    4. Implantable cardioverter-defibrillator (ICD) in situ  Z95.810 V45.02    5. Major depressive disorder, recurrent, moderate  F33.1 296.32    6. Mixed hyperlipidemia  E78.2 272.2    7. Pacemaker  Z95.0 V45.01    8. Tobacco dependency  F17.200 305.1    9. Tobacco abuse counseling  Z71.6 V65.42      305.1    10. Panlobular emphysema (HCC)  J43.1 492.8    11. Parkinson disease (HCC)  G20 332.0      No orders of the defined types were placed in this encounter.    Cannot display discharge medications since this is not an admission.

## 2020-10-03 MED ORDER — ALENDRONATE 70 MG TAB
70 mg | ORAL_TABLET | ORAL | 0 refills | Status: DC
Start: 2020-10-03 — End: 2020-11-04

## 2020-10-03 MED ORDER — SIMVASTATIN 20 MG TAB
20 mg | ORAL_TABLET | ORAL | 0 refills | Status: DC
Start: 2020-10-03 — End: 2020-11-04

## 2020-10-10 ENCOUNTER — Encounter

## 2020-10-10 MED ORDER — TRINTELLIX 20 MG TABLET
20 mg | ORAL_TABLET | ORAL | 0 refills | Status: DC
Start: 2020-10-10 — End: 2020-11-11

## 2020-10-10 MED ORDER — CARBIDOPA-LEVODOPA ER 50-200 MG TABLET, EXTENDED RELEASE
50-200 mg | ORAL_TABLET | ORAL | 0 refills | Status: DC
Start: 2020-10-10 — End: 2020-11-11

## 2020-10-27 MED ORDER — ALBUTEROL SULFATE HFA 90 MCG/ACTUATION AEROSOL INHALER
90 mcg/actuation | RESPIRATORY_TRACT | 4 refills | Status: DC
Start: 2020-10-27 — End: 2021-05-18

## 2020-10-27 MED ORDER — BUDESONIDE-FORMOTEROL HFA 160 MCG-4.5 MCG/ACTUATION AEROSOL INHALER
Freq: Every day | RESPIRATORY_TRACT | 2 refills | Status: DC
Start: 2020-10-27 — End: 2021-05-18

## 2020-10-27 NOTE — Telephone Encounter (Signed)
Dr. Dorthea Cove,      The insurance company will not pay for Trelegy Ellipta 100-62.5-25MCG/INH aerosol powder.  They said put in a prescription for Symbicort.

## 2020-11-04 MED ORDER — ALENDRONATE 70 MG TAB
70 mg | ORAL_TABLET | ORAL | 0 refills | Status: DC
Start: 2020-11-04 — End: 2020-11-26

## 2020-11-04 MED ORDER — AMIODARONE 200 MG TAB
200 mg | ORAL_TABLET | Freq: Every day | ORAL | 0 refills | Status: DC
Start: 2020-11-04 — End: 2020-11-28

## 2020-11-04 MED ORDER — OMEPRAZOLE 40 MG CAP, DELAYED RELEASE
40 mg | ORAL_CAPSULE | ORAL | 0 refills | Status: DC
Start: 2020-11-04 — End: 2020-11-28

## 2020-11-04 MED ORDER — SIMVASTATIN 20 MG TAB
20 mg | ORAL_TABLET | ORAL | 0 refills | Status: DC
Start: 2020-11-04 — End: 2020-11-28

## 2020-11-04 MED ORDER — RANOLAZINE SR 500 MG 12 HR TAB
500 mg | ORAL_TABLET | ORAL | 0 refills | Status: DC
Start: 2020-11-04 — End: 2020-11-28

## 2020-11-10 ENCOUNTER — Encounter

## 2020-11-11 MED ORDER — CARBIDOPA-LEVODOPA ER 50-200 MG TABLET, EXTENDED RELEASE
50-200 mg | ORAL_TABLET | ORAL | 0 refills | Status: DC
Start: 2020-11-11 — End: 2020-11-28

## 2020-11-11 MED ORDER — TRINTELLIX 20 MG TABLET
20 mg | ORAL_TABLET | ORAL | 0 refills | Status: DC
Start: 2020-11-11 — End: 2020-11-28

## 2020-11-26 MED ORDER — ALENDRONATE 70 MG TAB
70 mg | ORAL_TABLET | ORAL | 0 refills | Status: DC
Start: 2020-11-26 — End: 2020-11-28

## 2020-11-28 ENCOUNTER — Encounter

## 2020-11-28 MED ORDER — RANOLAZINE SR 500 MG 12 HR TAB
500 mg | ORAL_TABLET | ORAL | 0 refills | Status: DC
Start: 2020-11-28 — End: 2020-12-22

## 2020-11-28 MED ORDER — SIMVASTATIN 20 MG TAB
20 mg | ORAL_TABLET | ORAL | 0 refills | Status: DC
Start: 2020-11-28 — End: 2021-01-27

## 2020-11-28 MED ORDER — TRINTELLIX 20 MG TABLET
20 mg | ORAL_TABLET | ORAL | 0 refills | Status: DC
Start: 2020-11-28 — End: 2020-12-22

## 2020-11-28 MED ORDER — AMIODARONE 200 MG TAB
200 mg | ORAL_TABLET | Freq: Every day | ORAL | 0 refills | Status: DC
Start: 2020-11-28 — End: 2020-12-22

## 2020-11-28 MED ORDER — OMEPRAZOLE 40 MG CAP, DELAYED RELEASE
40 mg | ORAL_CAPSULE | ORAL | 0 refills | Status: DC
Start: 2020-11-28 — End: 2020-12-22

## 2020-11-28 MED ORDER — CARBIDOPA-LEVODOPA ER 50-200 MG TABLET, EXTENDED RELEASE
50-200 mg | ORAL_TABLET | ORAL | 0 refills | Status: DC
Start: 2020-11-28 — End: 2020-12-22

## 2020-11-28 MED ORDER — ALENDRONATE 70 MG TAB
70 mg | ORAL_TABLET | ORAL | 0 refills | Status: DC
Start: 2020-11-28 — End: 2021-01-27

## 2020-12-05 MED ORDER — TRELEGY ELLIPTA 100 MCG-62.5 MCG-25 MCG POWDER FOR INHALATION
RESPIRATORY_TRACT | 1 refills | Status: DC
Start: 2020-12-05 — End: 2021-01-27

## 2020-12-22 ENCOUNTER — Encounter

## 2020-12-22 MED ORDER — AMIODARONE 200 MG TAB
200 mg | ORAL_TABLET | Freq: Every day | ORAL | 0 refills | Status: DC
Start: 2020-12-22 — End: 2020-12-23

## 2020-12-22 MED ORDER — OMEPRAZOLE 40 MG CAP, DELAYED RELEASE
40 mg | ORAL_CAPSULE | ORAL | 0 refills | Status: DC
Start: 2020-12-22 — End: 2020-12-23

## 2020-12-22 MED ORDER — SPIRONOLACTONE 25 MG TAB
25 mg | ORAL_TABLET | ORAL | 0 refills | Status: DC
Start: 2020-12-22 — End: 2020-12-23

## 2020-12-22 MED ORDER — RANOLAZINE SR 500 MG 12 HR TAB
500 mg | ORAL_TABLET | ORAL | 0 refills | Status: DC
Start: 2020-12-22 — End: 2020-12-23

## 2020-12-22 MED ORDER — TRINTELLIX 20 MG TABLET
20 mg | ORAL_TABLET | ORAL | 0 refills | Status: DC
Start: 2020-12-22 — End: 2020-12-23

## 2020-12-22 MED ORDER — CARBIDOPA-LEVODOPA ER 50-200 MG TABLET, EXTENDED RELEASE
50-200 mg | ORAL_TABLET | ORAL | 0 refills | Status: DC
Start: 2020-12-22 — End: 2020-12-23

## 2020-12-23 ENCOUNTER — Encounter

## 2020-12-24 MED ORDER — VORTIOXETINE 20 MG TABLET
20 mg | ORAL_TABLET | Freq: Every day | ORAL | 0 refills | Status: DC
Start: 2020-12-24 — End: 2021-01-27

## 2020-12-24 MED ORDER — OMEPRAZOLE 40 MG CAP, DELAYED RELEASE
40 mg | ORAL_CAPSULE | Freq: Every day | ORAL | 0 refills | Status: DC
Start: 2020-12-24 — End: 2021-01-27

## 2020-12-24 MED ORDER — SPIRONOLACTONE 25 MG TAB
25 mg | ORAL_TABLET | ORAL | 0 refills | Status: AC
Start: 2020-12-24 — End: 2021-05-18

## 2020-12-24 MED ORDER — AMIODARONE 200 MG TAB
200 mg | ORAL_TABLET | Freq: Every day | ORAL | 0 refills | Status: DC
Start: 2020-12-24 — End: 2021-01-27

## 2020-12-24 MED ORDER — CARBIDOPA-LEVODOPA ER 50-200 MG TABLET, EXTENDED RELEASE
50-200 mg | ORAL_TABLET | ORAL | 0 refills | Status: DC
Start: 2020-12-24 — End: 2021-03-03

## 2020-12-24 MED ORDER — RANOLAZINE SR 500 MG 12 HR TAB
500 mg | ORAL_TABLET | Freq: Two times a day (BID) | ORAL | 0 refills | Status: DC
Start: 2020-12-24 — End: 2021-01-27

## 2020-12-29 MED ORDER — CARVEDILOL 6.25 MG TAB
6.25 mg | ORAL_TABLET | Freq: Two times a day (BID) | ORAL | 0 refills | Status: DC
Start: 2020-12-29 — End: 2021-01-24

## 2021-01-06 ENCOUNTER — Encounter

## 2021-01-06 NOTE — Telephone Encounter (Signed)
Can you order some basic labs for patient son wants to take him to have them done before his appointment has not had any done with you.

## 2021-01-08 LAB — LIPID PANEL
Cholesterol, Total: 134 mg/dL (ref 100–199)
Cholesterol, total: 134 mg/dL (ref 100–199)
HDL Cholesterol: 34 mg/dL — ABNORMAL LOW (ref >39)
HDL: 34 mg/dL — ABNORMAL LOW (ref >39)
LDL Calculated: 67 mg/dL (ref 0–99)
LDL, calculated: 67 mg/dL (ref 0–99)
Triglyceride: 199 mg/dL — ABNORMAL HIGH (ref 0–149)
Triglycerides: 199 mg/dL — ABNORMAL HIGH (ref 0–149)
VLDL, calculated: 33 mg/dL (ref 5–40)
VLDL: 33 mg/dL (ref 5–40)

## 2021-01-08 LAB — METABOLIC PANEL, COMPREHENSIVE
A-G Ratio: 1.3 (ref 1.2–2.2)
ALT (SGPT): 14 IU/L (ref 0–44)
AST (SGOT): 33 IU/L (ref 0–40)
Albumin: 4.1 g/dL (ref 3.8–4.8)
Alk. phosphatase: 62 IU/L (ref 44–121)
BUN/Creatinine ratio: 12 (ref 10–24)
BUN: 16 mg/dL (ref 8–27)
Bilirubin, total: 0.3 mg/dL (ref 0.0–1.2)
CO2: 21 mmol/L (ref 20–29)
Calcium: 9 mg/dL (ref 8.6–10.2)
Chloride: 102 mmol/L (ref 96–106)
Creatinine: 1.31 mg/dL — ABNORMAL HIGH (ref 0.76–1.27)
GLOBULIN, TOTAL: 3.1 g/dL (ref 1.5–4.5)
Glucose: 209 mg/dL — ABNORMAL HIGH (ref 65–99)
Potassium: 4.6 mmol/L (ref 3.5–5.2)
Protein, total: 7.2 g/dL (ref 6.0–8.5)
Sodium: 140 mmol/L (ref 134–144)
eGFR: 59 mL/min/{1.73_m2} — ABNORMAL LOW (ref >59)

## 2021-01-08 LAB — CBC W/O DIFF
HCT: 36.5 % — ABNORMAL LOW (ref 37.5–51.0)
HGB: 11.9 g/dL — ABNORMAL LOW (ref 13.0–17.7)
MCH: 27.8 pg (ref 26.6–33.0)
MCHC: 32.6 g/dL (ref 31.5–35.7)
MCV: 85 fL (ref 79–97)
PLATELET: 283 10*3/uL (ref 150–450)
RBC: 4.28 x10E6/uL (ref 4.14–5.80)
RDW: 14.4 % (ref 11.6–15.4)
WBC: 7.9 10*3/uL (ref 3.4–10.8)

## 2021-01-08 LAB — CBC
Hematocrit: 36.5 % — ABNORMAL LOW (ref 37.5–51.0)
Hemoglobin: 11.9 g/dL — ABNORMAL LOW (ref 13.0–17.7)
MCH: 27.8 pg (ref 26.6–33.0)
MCHC: 32.6 g/dL (ref 31.5–35.7)
MCV: 85 fL (ref 79–97)
Platelets: 283 10*3/uL (ref 150–450)
RBC: 4.28 x10E6/uL (ref 4.14–5.80)
RDW: 14.4 % (ref 11.6–15.4)
WBC: 7.9 10*3/uL (ref 3.4–10.8)

## 2021-01-08 LAB — COMPREHENSIVE METABOLIC PANEL
ALT: 14 IU/L (ref 0–44)
AST: 33 IU/L (ref 0–40)
Albumin/Globulin Ratio: 1.3 NA (ref 1.2–2.2)
Albumin: 4.1 g/dL (ref 3.8–4.8)
Alkaline Phosphatase: 62 IU/L (ref 44–121)
BUN: 16 mg/dL (ref 8–27)
Bun/Cre Ratio: 12 NA (ref 10–24)
CO2: 21 mmol/L (ref 20–29)
Calcium: 9 mg/dL (ref 8.6–10.2)
Chloride: 102 mmol/L (ref 96–106)
Creatinine: 1.31 mg/dL — ABNORMAL HIGH (ref 0.76–1.27)
Est, Glomerular Filtration Rate: 59 mL/min/{1.73_m2} — ABNORMAL LOW (ref >59)
Globulin, Total: 3.1 g/dL (ref 1.5–4.5)
Glucose: 209 mg/dL — ABNORMAL HIGH (ref 65–99)
Potassium: 4.6 mmol/L (ref 3.5–5.2)
Sodium: 140 mmol/L (ref 134–144)
Total Bilirubin: 0.3 mg/dL (ref 0.0–1.2)
Total Protein: 7.2 g/dL (ref 6.0–8.5)

## 2021-01-13 MED ORDER — APIXABAN 5 MG TABLET
5 mg | ORAL_TABLET | ORAL | 0 refills | Status: DC
Start: 2021-01-13 — End: 2021-01-27

## 2021-01-22 ENCOUNTER — Ambulatory Visit: Admit: 2021-01-22 | Discharge: 2021-01-22 | Payer: MEDICARE | Attending: Family Medicine | Primary: Family Medicine

## 2021-01-22 ENCOUNTER — Ambulatory Visit: Attending: Family Medicine | Primary: Internal Medicine

## 2021-01-22 DIAGNOSIS — K2100 Gastro-esophageal reflux disease with esophagitis, without bleeding: Secondary | ICD-10-CM

## 2021-01-22 MED ORDER — FLUTICASONE-SALMETEROL 250 MCG-50 MCG/DOSE DISK DEVICE FOR INHALATION
250-50 mcg/dose | Freq: Two times a day (BID) | RESPIRATORY_TRACT | 2 refills | Status: AC
Start: 2021-01-22 — End: 2021-02-21

## 2021-01-22 NOTE — Progress Notes (Signed)
Carlos Petersen is a 71 y.o. male and presents with Follow-up and Hypertension  .  HPI     Subjective:  Cardiovascular Review:  The patient has hypertension   Diet and Lifestyle: generally follows a low fat low cholesterol diet, generally follows a low sodium diet, exercises sporadically  Home BP Monitoring: is not measured at home.  Pertinent ROS: taking medications as instructed, no medication side effects noted, no TIA's, no chest pain on exertion, no dyspnea on exertion, no swelling of ankles.     Review of Systems  Review of Systems   Constitutional: Negative.  Negative for chills and fever.   HENT: Negative.  Negative for congestion, ear discharge, hearing loss, nosebleeds and tinnitus.    Eyes: Negative.  Negative for blurred vision, double vision, photophobia and pain.   Respiratory: Positive for cough, sputum production, shortness of breath and wheezing. Negative for hemoptysis.    Cardiovascular: Negative.  Negative for chest pain and palpitations.   Gastrointestinal: Negative.  Negative for heartburn, nausea and vomiting.   Genitourinary: Negative.  Negative for dysuria, frequency and urgency.   Musculoskeletal: Negative.  Negative for back pain and myalgias.   Skin: Negative.    Neurological: Negative.  Negative for dizziness, tingling, weakness and headaches.   Endo/Heme/Allergies: Negative.    Psychiatric/Behavioral: Negative.  Negative for depression and suicidal ideas. The patient does not have insomnia.    All other systems reviewed and are negative.        Past Medical History:   Diagnosis Date   ??? COPD (chronic obstructive pulmonary disease) ([place])    ??? Depression    ??? Diabetes ([place])    ??? Emphysema lung ([place])    ??? Heart disease    ??? Hypercholesterolemia    ??? Parkinson disease ([place])    ??? Pulmonary embolism [facility])      Past Surgical History:   Procedure Laterality Date   ??? HX COLONOSCOPY     ??? HX HIP REPLACEMENT Right 01/2016   ??? HX IMPLANTABLE CARDIOVERTER DEFIBRILLATOR     ??? HX PACEMAKER     ??? HX  PACEMAKER PLACEMENT       Social History     Socioeconomic History   ??? Marital status: UNKNOWN   Tobacco Use   ??? Smoking status: Current Every Day Smoker     Packs/day: 1.00     Years: 45.00     Pack years: 45.00     Types: Cigarettes   ??? Smokeless tobacco: Never Used   Substance and Sexual Activity   ??? Alcohol use: Never   ??? Drug use: Never     No family history on file.  Current Outpatient Medications   Medication Sig Dispense Refill   ??? fluticasone propion-salmeteroL (ADVAIR/WIXELA) 250-50 mcg/dose diskus inhaler Take 1 Puff by inhalation every twelve (12) hours for 30 days. 60 Each 2   ??? apixaban (Eliquis) 5 mg tablet TAKE 1 TABLET BY MOUTH TWICE DAILY. 60 Tablet 0   ??? carvediloL (COREG) 6.25 mg tablet Take 1 Tablet by mouth two (2) times a day. 60 Tablet 0   ??? ranolazine ER (RANEXA) 500 mg SR tablet Take 1 Tablet by mouth two (2) times a day. 60 Tablet 0   ??? carbidopa-levodopa ER (SINEMET CR) 50-200 mg per tablet TAKE (2) TABLETS BY MOUTH THREE TIMES DAILY. 180 Tablet 0   ??? vortioxetine (Trintellix) 20 mg tablet Take 1 Tablet by mouth daily. 30 Tablet 0   ??? spironolactone (ALDACTONE) 25  mg tablet TAKE ONE TABLET BY MOUTH DAILY. 30 Tablet 0   ??? omeprazole (PRILOSEC) 40 mg capsule Take 1 Capsule by mouth daily. 30 Capsule 0   ??? amiodarone (CORDARONE) 200 mg tablet Take 1 Tablet by mouth daily. 30 Tablet 0   ??? Trelegy Ellipta 100-62.5-25 mcg inhaler INHALE 1 PUFF DAILY. 60 Blister 1   ??? alendronate (FOSAMAX) 70 mg tablet 1 TAB WEEKLY W/6-8OZS WATER 30MINS PRIOR TO 1ST MED/FOOD/ BEVERAGE-DO NOT LIE DOWN FOR 30 MINS 4 Tablet 0   ??? simvastatin (ZOCOR) 20 mg tablet TAKE ONE TABLET BY MOUTH DAILY. 30 Tablet 0   ??? albuterol (PROVENTIL HFA, VENTOLIN HFA, PROAIR HFA) 90 mcg/actuation inhaler INHALE 2 PUFFS EVERY SIX HOURS AS NEEDED 18 g 4   ??? budesonide-formoteroL (SYMBICORT) 160-4.5 mcg/actuation HFAA Take 2 Puffs by inhalation daily. 10.2 g 2   ??? fenofibrate nanocrystallized (TRICOR) 145 mg tablet Take 1 tablet by mouth  daily. 90 Tablet 0     No Known Allergies    Objective:  Visit Vitals  BP (!) 86/64 (BP 1 Location: Right upper arm, BP Patient Position: Sitting, BP Cuff Size: Adult)   Pulse 78   Temp 97.7 ??F (36.5 ??C) (Oral)   Resp 17   Ht '5\' 11"'  (1.803 m)   Wt 236 lb 6.4 oz (107.2 kg)   SpO2 96%   BMI 32.97 kg/m??       Physical Exam:   Physical Exam  Vitals and nursing note reviewed.   Constitutional:       General: He is not in acute distress.     Appearance: Normal appearance. He is obese. He is not ill-appearing, toxic-appearing or diaphoretic.   HENT:      Head: Normocephalic and atraumatic.      Right Ear: Tympanic membrane, ear canal and external ear normal. There is no impacted cerumen.      Left Ear: Tympanic membrane, ear canal and external ear normal. There is no impacted cerumen.      Nose: Nose normal. No congestion or rhinorrhea.      Mouth/Throat:      Mouth: Mucous membranes are moist.      Pharynx: Oropharynx is clear. No oropharyngeal exudate or posterior oropharyngeal erythema.   Eyes:      General: No scleral icterus.        Right eye: No discharge.         Left eye: No discharge.      Extraocular Movements: Extraocular movements intact.      Conjunctiva/sclera: Conjunctivae normal.      Pupils: Pupils are equal, round, and reactive to light.   Neck:      Vascular: No carotid bruit.   Cardiovascular:      Rate and Rhythm: Normal rate and regular rhythm.      Pulses: Normal pulses.      Heart sounds: Normal heart sounds. No murmur heard.  No friction rub. No gallop.    Pulmonary:      Effort: Pulmonary effort is normal. No respiratory distress.      Breath sounds: No stridor. Wheezing present. No rhonchi or rales.   Chest:      Chest wall: No tenderness.   Abdominal:      General: Abdomen is flat. Bowel sounds are normal. There is no distension.      Palpations: Abdomen is soft. There is no mass.      Tenderness: There is no abdominal tenderness. There is no right CVA  tenderness, left CVA tenderness, guarding  or rebound.      Hernia: No hernia is present.   Musculoskeletal:         General: No swelling, tenderness, deformity or signs of injury. Normal range of motion.      Cervical back: Normal range of motion and neck supple. No rigidity. No muscular tenderness.      Right lower leg: No edema.      Left lower leg: No edema.   Lymphadenopathy:      Cervical: No cervical adenopathy.   Skin:     General: Skin is warm.      Capillary Refill: Capillary refill takes 2 to 3 seconds.      Coloration: Skin is not jaundiced or pale.      Findings: No bruising, erythema, lesion or rash.   Neurological:      General: No focal deficit present.      Mental Status: He is alert and oriented to person, place, and time. Mental status is at baseline.      Cranial Nerves: No cranial nerve deficit.      Sensory: No sensory deficit.      Motor: No weakness.      Coordination: Coordination normal.      Gait: Gait normal.      Deep Tendon Reflexes: Reflexes normal.   Psychiatric:         Mood and Affect: Mood normal.         Behavior: Behavior normal.         Thought Content: Thought content normal.         Judgment: Judgment normal.             Results for orders placed or performed in visit on 27/25/36   METABOLIC PANEL, COMPREHENSIVE   Result Value Ref Range    Glucose 209 (H) 65 - 99 mg/dL    BUN 16 8 - 27 mg/dL    Creatinine 1.31 (H) 0.76 - 1.27 mg/dL    eGFR 59 (L) >59 mL/min/1.73    BUN/Creatinine ratio 12 10 - 24    Sodium 140 134 - 144 mmol/L    Potassium 4.6 3.5 - 5.2 mmol/L    Chloride 102 96 - 106 mmol/L    CO2 21 20 - 29 mmol/L    Calcium 9.0 8.6 - 10.2 mg/dL    Protein, total 7.2 6.0 - 8.5 g/dL    Albumin 4.1 3.8 - 4.8 g/dL    GLOBULIN, TOTAL 3.1 1.5 - 4.5 g/dL    A-G Ratio 1.3 1.2 - 2.2    Bilirubin, total 0.3 0.0 - 1.2 mg/dL    Alk. phosphatase 62 44 - 121 IU/L    AST (SGOT) 33 0 - 40 IU/L    ALT (SGPT) 14 0 - 44 IU/L   CBC W/O DIFF   Result Value Ref Range    WBC 7.9 3.4 - 10.8 x10E3/uL    RBC 4.28 4.14 - 5.80 x10E6/uL    HGB  11.9 (L) 13.0 - 17.7 g/dL    HCT 36.5 (L) 37.5 - 51.0 %    MCV 85 79 - 97 fL    MCH 27.8 26.6 - 33.0 pg    MCHC 32.6 31.5 - 35.7 g/dL    RDW 14.4 11.6 - 15.4 %    PLATELET 283 150 - 450 x10E3/uL   LIPID PANEL   Result Value Ref Range    Cholesterol, total 134 100 - 199 mg/dL  Triglyceride 199 (H) 0 - 149 mg/dL    HDL Cholesterol 34 (L) >39 mg/dL    VLDL, calculated 33 5 - 40 mg/dL    LDL, calculated 67 0 - 99 mg/dL       Assessment/Plan:    ICD-10-CM ICD-9-CM    1. Gastroesophageal reflux disease with esophagitis without hemorrhage  K21.00 530.81      530.10    2. Permanent atrial fibrillation (HCC)  I48.21 427.31 REFERRAL TO CARDIOLOGY   3. Mixed hyperlipidemia  E78.2 272.2    4. Tobacco dependency  F17.200 305.1    5. Tobacco abuse counseling  Z71.6 V65.42      305.1    6. Pacemaker  Z95.0 V45.01 REFERRAL TO CARDIOLOGY   7. Hypotension due to drugs  I95.2 458.8      E947.9     decrease spironolactone from 25 to 12.41m daily or 278mqod   8. Major depressive disorder, recurrent, moderate  F33.1 296.32    9. [place] disease ([place] G20 332.0 REFERRAL TO NEUROLOGY     Orders Placed This Encounter   ??? REFERRAL TO NEUROLOGY     Referral Priority:   Routine     Referral Type:   Consultation     Referral Reason:   Specialty Services Required     Referred to Provider:   KhJana HakimMD     Number of Visits Requested:   1   ??? [facility]   Referral Priority:   Routine     Referral Type:   Consultation     Referral Reason:   Specialty Services Required     Referred to Provider:   DaIsidore MoosMD     Number of Visits Requested:   1   ??? fluticasone propion-salmeteroL (ADVAIR/WIXELA) 250-50 mcg/dose diskus inhaler     Sig: Take 1 Puff by inhalation every twelve (12) hours for 30 days.     Dispense:  60 Each     Refill:  2     Cannot display discharge medications since this is not an admission.

## 2021-01-22 NOTE — Progress Notes (Signed)
 1. Have you been to the ER, urgent care clinic since your last visit?  Hospitalized since your last visit? No    2. Have you seen or consulted any other health care providers outside of the [facility] since your last visit? No     3. For patients aged 71-75: Has the patient had a colonoscopy / FIT/ Cologuard? No      If the patient is male:    4. For patients aged 45-74: Has the patient had a mammogram within the past 2 years? NA - based on age or sex      60. For patients aged 21-65: Has the patient had a pap smear? NA - based on age or sex     Chief Complaint   Patient presents with   . Follow-up   . Hypertension       Visit Vitals  BP (!) 86/64 (BP 1 Location: Right upper arm, BP Patient Position: Sitting, BP Cuff Size: Adult)   Pulse 78   Temp 97.7 F (36.5 C) (Oral)   Resp 17   Ht 5' 11 (1.803 m)   Wt 236 lb 6.4 oz (107.2 kg)   SpO2 96%   BMI 32.97 kg/m

## 2021-01-25 MED ORDER — CARVEDILOL 6.25 MG TAB
6.25 mg | ORAL_TABLET | ORAL | 0 refills | Status: DC
Start: 2021-01-25 — End: 2021-01-27

## 2021-01-27 ENCOUNTER — Encounter

## 2021-01-27 MED ORDER — TRELEGY ELLIPTA 100 MCG-62.5 MCG-25 MCG POWDER FOR INHALATION
RESPIRATORY_TRACT | 2 refills | Status: DC
Start: 2021-01-27 — End: 2021-04-17

## 2021-01-27 MED ORDER — CARVEDILOL 6.25 MG TAB
6.25 mg | ORAL_TABLET | ORAL | 0 refills | Status: DC
Start: 2021-01-27 — End: 2021-03-11

## 2021-01-27 MED ORDER — ALENDRONATE 70 MG TAB
70 mg | ORAL_TABLET | ORAL | 0 refills | Status: DC
Start: 2021-01-27 — End: 2021-02-23

## 2021-01-27 MED ORDER — SIMVASTATIN 20 MG TAB
20 mg | ORAL_TABLET | ORAL | 0 refills | Status: DC
Start: 2021-01-27 — End: 2021-02-23

## 2021-01-27 MED ORDER — APIXABAN 5 MG TABLET
5 mg | ORAL_TABLET | ORAL | 0 refills | Status: DC
Start: 2021-01-27 — End: 2021-02-23

## 2021-01-27 MED ORDER — AMIODARONE 200 MG TAB
200 mg | ORAL_TABLET | ORAL | 0 refills | Status: DC
Start: 2021-01-27 — End: 2021-02-23

## 2021-01-27 MED ORDER — TRINTELLIX 20 MG TABLET
20 mg | ORAL_TABLET | ORAL | 0 refills | Status: DC
Start: 2021-01-27 — End: 2021-03-17

## 2021-01-27 MED ORDER — RANOLAZINE SR 500 MG 12 HR TAB
500 mg | ORAL_TABLET | ORAL | 0 refills | Status: DC
Start: 2021-01-27 — End: 2021-02-23

## 2021-01-27 MED ORDER — OMEPRAZOLE 40 MG CAP, DELAYED RELEASE
40 mg | ORAL_CAPSULE | ORAL | 0 refills | Status: DC
Start: 2021-01-27 — End: 2021-02-24

## 2021-02-23 MED ORDER — ALENDRONATE 70 MG TAB
70 mg | ORAL_TABLET | ORAL | 0 refills | Status: DC
Start: 2021-02-23 — End: 2021-03-17

## 2021-02-23 MED ORDER — RANOLAZINE SR 500 MG 12 HR TAB
500 mg | ORAL_TABLET | ORAL | 0 refills | Status: DC
Start: 2021-02-23 — End: 2021-03-17

## 2021-02-23 MED ORDER — AMIODARONE 200 MG TAB
200 mg | ORAL_TABLET | ORAL | 0 refills | Status: DC
Start: 2021-02-23 — End: 2021-03-17

## 2021-02-23 MED ORDER — APIXABAN 5 MG TABLET
5 mg | ORAL_TABLET | ORAL | 0 refills | Status: DC
Start: 2021-02-23 — End: 2021-03-11

## 2021-02-23 MED ORDER — SIMVASTATIN 20 MG TAB
20 mg | ORAL_TABLET | ORAL | 0 refills | Status: DC
Start: 2021-02-23 — End: 2021-03-17

## 2021-02-24 MED ORDER — OMEPRAZOLE 40 MG CAP, DELAYED RELEASE
40 mg | ORAL_CAPSULE | ORAL | 0 refills | Status: DC
Start: 2021-02-24 — End: 2021-02-24

## 2021-02-24 MED ORDER — OMEPRAZOLE 40 MG CAP, DELAYED RELEASE
40 mg | ORAL_CAPSULE | Freq: Every day | ORAL | 0 refills | Status: DC
Start: 2021-02-24 — End: 2021-04-17

## 2021-03-03 MED ORDER — CARBIDOPA-LEVODOPA ER 50-200 MG TABLET, EXTENDED RELEASE
50-200 mg | ORAL_TABLET | ORAL | 0 refills | Status: DC
Start: 2021-03-03 — End: 2021-03-04

## 2021-03-04 MED ORDER — CARBIDOPA-LEVODOPA ER 50-200 MG TABLET, EXTENDED RELEASE
50-200 mg | ORAL_TABLET | ORAL | 0 refills | Status: DC
Start: 2021-03-04 — End: 2021-03-11

## 2021-03-11 MED ORDER — CARVEDILOL 6.25 MG TAB
6.25 mg | ORAL_TABLET | Freq: Two times a day (BID) | ORAL | 0 refills | Status: DC
Start: 2021-03-11 — End: 2021-03-17

## 2021-03-11 MED ORDER — APIXABAN 5 MG TABLET
5 mg | ORAL_TABLET | ORAL | 0 refills | Status: DC
Start: 2021-03-11 — End: 2021-04-17

## 2021-03-11 MED ORDER — CARBIDOPA-LEVODOPA ER 50-200 MG TABLET, EXTENDED RELEASE
50-200 mg | ORAL_TABLET | ORAL | 0 refills | Status: DC
Start: 2021-03-11 — End: 2021-03-17

## 2021-03-17 ENCOUNTER — Encounter

## 2021-03-17 MED ORDER — CARBIDOPA-LEVODOPA ER 50-200 MG TABLET, EXTENDED RELEASE
50-200 mg | ORAL_TABLET | ORAL | 0 refills | Status: DC
Start: 2021-03-17 — End: 2021-04-17

## 2021-03-17 MED ORDER — AMIODARONE 200 MG TAB
200 mg | ORAL_TABLET | ORAL | 0 refills | Status: DC
Start: 2021-03-17 — End: 2021-04-17

## 2021-03-17 MED ORDER — ALENDRONATE 70 MG TAB
70 mg | ORAL_TABLET | ORAL | 0 refills | Status: DC
Start: 2021-03-17 — End: 2021-04-17

## 2021-03-17 MED ORDER — CARVEDILOL 6.25 MG TAB
6.25 mg | ORAL_TABLET | ORAL | 0 refills | Status: DC
Start: 2021-03-17 — End: 2021-04-17

## 2021-03-17 MED ORDER — TRINTELLIX 20 MG TABLET
20 mg | ORAL_TABLET | ORAL | 0 refills | Status: DC
Start: 2021-03-17 — End: 2021-04-17

## 2021-03-17 MED ORDER — SIMVASTATIN 20 MG TAB
20 mg | ORAL_TABLET | ORAL | 0 refills | Status: DC
Start: 2021-03-17 — End: 2021-04-17

## 2021-03-17 MED ORDER — RANOLAZINE SR 500 MG 12 HR TAB
500 mg | ORAL_TABLET | ORAL | 0 refills | Status: DC
Start: 2021-03-17 — End: 2021-04-17

## 2021-03-17 MED ORDER — FENOFIBRATE NANOCRYSTALLIZED 145 MG TAB
145 mg | ORAL_TABLET | ORAL | 0 refills | Status: DC
Start: 2021-03-17 — End: 2021-04-17

## 2021-04-17 ENCOUNTER — Encounter

## 2021-04-17 MED ORDER — OMEPRAZOLE 40 MG CAP, DELAYED RELEASE
40 mg | ORAL_CAPSULE | ORAL | 0 refills | Status: DC
Start: 2021-04-17 — End: 2021-05-18

## 2021-04-17 MED ORDER — AMIODARONE 200 MG TAB
200 mg | ORAL_TABLET | ORAL | 0 refills | Status: DC
Start: 2021-04-17 — End: 2021-05-18

## 2021-04-17 MED ORDER — CARVEDILOL 6.25 MG TAB
6.25 mg | ORAL_TABLET | ORAL | 0 refills | Status: DC
Start: 2021-04-17 — End: 2021-05-18

## 2021-04-17 MED ORDER — CARBIDOPA-LEVODOPA ER 50-200 MG TABLET, EXTENDED RELEASE
50-200 mg | ORAL_TABLET | ORAL | 0 refills | Status: DC
Start: 2021-04-17 — End: 2021-05-18

## 2021-04-17 MED ORDER — SIMVASTATIN 20 MG TAB
20 mg | ORAL_TABLET | ORAL | 0 refills | Status: DC
Start: 2021-04-17 — End: 2021-05-18

## 2021-04-17 MED ORDER — ALENDRONATE 70 MG TAB
70 mg | ORAL_TABLET | ORAL | 0 refills | Status: DC
Start: 2021-04-17 — End: 2021-05-18

## 2021-04-17 MED ORDER — RANOLAZINE SR 500 MG 12 HR TAB
500 mg | ORAL_TABLET | ORAL | 0 refills | Status: DC
Start: 2021-04-17 — End: 2021-05-18

## 2021-04-17 MED ORDER — FENOFIBRATE NANOCRYSTALLIZED 145 MG TAB
145 mg | ORAL_TABLET | ORAL | 0 refills | Status: DC
Start: 2021-04-17 — End: 2021-05-18

## 2021-04-17 MED ORDER — TRINTELLIX 20 MG TABLET
20 mg | ORAL_TABLET | ORAL | 0 refills | Status: DC
Start: 2021-04-17 — End: 2021-05-28

## 2021-04-17 MED ORDER — TRELEGY ELLIPTA 100 MCG-62.5 MCG-25 MCG POWDER FOR INHALATION
RESPIRATORY_TRACT | 0 refills | Status: DC
Start: 2021-04-17 — End: 2021-05-18

## 2021-04-17 MED ORDER — APIXABAN 5 MG TABLET
5 mg | ORAL_TABLET | ORAL | 0 refills | Status: DC
Start: 2021-04-17 — End: 2021-05-18

## 2021-04-30 ENCOUNTER — Encounter: Attending: Family Medicine | Primary: Family Medicine

## 2021-05-14 ENCOUNTER — Encounter

## 2021-05-14 NOTE — Telephone Encounter (Signed)
Left msg for patient to call an schedule an appointment

## 2021-05-18 ENCOUNTER — Ambulatory Visit: Admit: 2021-05-18 | Discharge: 2021-05-18 | Payer: MEDICARE | Attending: Internal Medicine | Primary: Internal Medicine

## 2021-05-18 ENCOUNTER — Ambulatory Visit: Attending: Internal Medicine | Primary: Internal Medicine

## 2021-05-18 DIAGNOSIS — Z95810 Presence of automatic (implantable) cardiac defibrillator: Secondary | ICD-10-CM

## 2021-05-18 MED ORDER — EMPAGLIFLOZIN 10 MG TABLET
10 mg | ORAL_TABLET | Freq: Every day | ORAL | 0 refills | Status: DC
Start: 2021-05-18 — End: 2021-08-06

## 2021-05-18 MED ORDER — RANOLAZINE SR 500 MG 12 HR TAB
500 mg | ORAL_TABLET | Freq: Two times a day (BID) | ORAL | 2 refills | Status: DC
Start: 2021-05-18 — End: 2021-11-24

## 2021-05-18 MED ORDER — SEMAGLUTIDE 0.25 MG OR 0.5 MG (2 MG/1.5 ML) SUBCUTANEOUS PEN INJECTOR
0.25 mg or 0.5 mg(2 mg/1.5 mL) | PEN_INJECTOR | SUBCUTANEOUS | 0 refills | Status: DC
Start: 2021-05-18 — End: 2021-06-12

## 2021-05-18 MED ORDER — OMEPRAZOLE 40 MG CAP, DELAYED RELEASE
40 mg | ORAL_CAPSULE | Freq: Every day | ORAL | 1 refills | Status: DC
Start: 2021-05-18 — End: 2021-10-29

## 2021-05-18 MED ORDER — CARBIDOPA-LEVODOPA ER 50-200 MG TABLET, EXTENDED RELEASE
50-200 mg | ORAL_TABLET | Freq: Three times a day (TID) | ORAL | 1 refills | Status: DC
Start: 2021-05-18 — End: 2021-10-29

## 2021-05-18 MED ORDER — SIMVASTATIN 20 MG TAB
20 mg | ORAL_TABLET | Freq: Every day | ORAL | 0 refills | Status: AC
Start: 2021-05-18 — End: 2021-06-12

## 2021-05-18 MED ORDER — CARVEDILOL 6.25 MG TAB
6.25 mg | ORAL_TABLET | Freq: Two times a day (BID) | ORAL | 1 refills | Status: DC
Start: 2021-05-18 — End: 2021-10-29

## 2021-05-18 MED ORDER — TRELEGY ELLIPTA 100 MCG-62.5 MCG-25 MCG POWDER FOR INHALATION
RESPIRATORY_TRACT | 5 refills | Status: DC
Start: 2021-05-18 — End: 2021-10-29

## 2021-05-18 MED ORDER — ALBUTEROL SULFATE HFA 90 MCG/ACTUATION AEROSOL INHALER
90 mcg/actuation | RESPIRATORY_TRACT | 4 refills | Status: AC
Start: 2021-05-18 — End: ?

## 2021-05-18 MED ORDER — GABAPENTIN 100 MG CAP
100 mg | ORAL_CAPSULE | Freq: Two times a day (BID) | ORAL | 2 refills | Status: AC
Start: 2021-05-18 — End: 2021-08-16

## 2021-05-18 MED ORDER — APIXABAN 5 MG TABLET
5 mg | ORAL_TABLET | Freq: Two times a day (BID) | ORAL | 1 refills | Status: DC
Start: 2021-05-18 — End: 2021-10-29

## 2021-05-18 MED ORDER — AMIODARONE 200 MG TAB
200 mg | ORAL_TABLET | Freq: Every day | ORAL | 1 refills | Status: DC
Start: 2021-05-18 — End: 2021-10-29

## 2021-05-18 MED ORDER — ALENDRONATE 70 MG TAB
70 mg | ORAL_TABLET | ORAL | 1 refills | Status: DC
Start: 2021-05-18 — End: 2021-10-29

## 2021-05-18 NOTE — Progress Notes (Signed)
Carlos Petersen is a 70 y.o. male and presents with Medication Refill    Mr. Carlos Petersen is here re establishing care with me, I have not seen him in more than 3 years, he's here with his son Carlos Petersen. He says he's feeling well, denies sob, onlly whn climbing stairs   He has h/o HTN, atrial fibrillation, CAD, HF but he has not seen a cardiologist in years, I do not know his EF, he has ICD, Panlobular emphysema, Diabetes, but he tells me and his son tells me he has not been on medication and his glucose has been always in the 100s. Now, I see in the labs he did in May glucose was 209, I do not see an A1C.. will do one today.    Review of Systems  Review of Systems   Constitutional:  Negative for chills, fatigue, fever and unexpected weight change.   HENT:  Negative for congestion, ear pain, sneezing and sore throat.    Eyes:  Negative for pain and discharge.   Respiratory:  Negative for cough, shortness of breath and wheezing.    Cardiovascular:  Negative for chest pain, palpitations and leg swelling.   Gastrointestinal:  Negative for abdominal pain, blood in stool, constipation and diarrhea.   Endocrine: Negative for polydipsia and polyuria.   Genitourinary:  Negative for difficulty urinating, dysuria, frequency, hematuria and urgency.   Musculoskeletal:  Negative for arthralgias, back pain and joint swelling.   Skin:  Negative for rash.   Allergic/Immunologic: Negative for environmental allergies and food allergies.   Neurological:  Negative for dizziness, speech difficulty, weakness, light-headedness, numbness and headaches.   Hematological:  Negative for adenopathy.   Psychiatric/Behavioral:  Negative for behavioral problems (Depression), sleep disturbance and suicidal ideas.         Past Medical History:   Diagnosis Date    COPD (chronic obstructive pulmonary disease) ([place])     Depression     Diabetes ([place])     Emphysema lung ([place])     Heart disease     Hypercholesterolemia     Parkinson disease ([place])     Pulmonary  embolism (HCC)      Past Surgical History:   Procedure Laterality Date    HX COLONOSCOPY      HX HIP REPLACEMENT Right 01/2016    HX IMPLANTABLE CARDIOVERTER DEFIBRILLATOR      HX PACEMAKER      HX PACEMAKER PLACEMENT       Social History     Socioeconomic History    Marital status: SINGLE   Tobacco Use    Smoking status: Every Day     Packs/day: 1.00     Years: 45.00     Pack years: 45.00     Types: Cigarettes    Smokeless tobacco: Never   Substance and Sexual Activity    Alcohol use: Never    Drug use: Never     History reviewed. No pertinent family history.  Current Outpatient Medications   Medication Sig Dispense Refill    gabapentin (NEURONTIN) 100 mg capsule Take 1 Capsule by mouth two (2) times a day for 90 days. Max Daily Amount: 200 mg. 60 Capsule 2    ranolazine ER (RANEXA) 500 mg SR tablet Take 1 Tablet by mouth two (2) times a day. 180 Tablet 2    omeprazole (PRILOSEC) 40 mg capsule Take 1 Capsule by mouth Daily (before breakfast) for 180 days. 90 Capsule 1    fluticasone-umeclidinium-vilanterol (Trelegy Ellipta) 100-62.5-25 mcg  inhaler INHALE 1 PUFF BY MOUTH DAILY. 28 Blister 5    carvediloL (COREG) 6.25 mg tablet Take 1 Tablet by mouth two (2) times a day for 180 days. 180 Tablet 1    carbidopa-levodopa ER (SINEMET CR) 50-200 mg per tablet Take 2 Tablets by mouth three (3) times daily for 180 days. 540 Tablet 1    apixaban (Eliquis) 5 mg tablet Take 1 Tablet by mouth two (2) times a day for 180 days. TAKE 1 TABLET BY MOUTH TWICE DAILY. 180 Tablet 1    amiodarone (CORDARONE) 200 mg tablet Take 1 Tablet by mouth daily for 180 days. 90 Tablet 1    alendronate (FOSAMAX) 70 mg tablet 1 TAB WEEKLY W/6-8OZS WATER 30MINS PRIOR TO 1ST MED/FOOD/ BEVERAGE-DO NOT LIE DOWN FOR 30 MINS 12 Tablet 1    albuterol (PROVENTIL HFA, VENTOLIN HFA, PROAIR HFA) 90 mcg/actuation inhaler INHALE 2 PUFFS EVERY SIX HOURS AS NEEDED 18 g 4    empagliflozin (Jardiance) 10 mg tablet Take 1 Tablet by mouth daily for 90 days. 90 Tablet  0    semaglutide (Ozempic) 0.25 mg or 0.5 mg/dose (2 mg/1.5 ml) subq pen 0.5 mg by SubCUTAneous route every seven (7) days for 90 days. 3 Pen 0    simvastatin (ZOCOR) 20 mg tablet TAKE ONE TABLET BY MOUTH DAILY. 90 Tablet 3    vortioxetine (Trintellix) 20 mg tablet Take 1 Tablet by mouth daily. 90 Tablet 0     No Known Allergies    Objective:  Visit Vitals  BP (!) 111/54 (BP 1 Location: Right upper arm, BP Patient Position: Sitting, BP Cuff Size: Adult)   Pulse 82   Resp 18   Ht '5\' 11"'  (1.803 m)   Wt 271 lb (122.9 kg)   SpO2 91%   BMI 37.80 kg/m??     Physical Exam:   Physical Exam  Constitutional:       General: He is not in acute distress.     Appearance: Normal appearance.   HENT:      Head: Normocephalic and atraumatic.      Mouth/Throat:      Mouth: Mucous membranes are moist.   Eyes:      Extraocular Movements: Extraocular movements intact.      Conjunctiva/sclera: Conjunctivae normal.      Pupils: Pupils are equal, round, and reactive to light.   Cardiovascular:      Rate and Rhythm: Normal rate and regular rhythm.      Pulses: Normal pulses.      Heart sounds: Normal heart sounds.   Pulmonary:      Effort: Pulmonary effort is normal.      Breath sounds: Normal breath sounds.   Abdominal:      General: Abdomen is flat. Bowel sounds are normal. There is no distension.      Palpations: Abdomen is soft. There is no mass.      Tenderness: no abdominal tenderness   Musculoskeletal:         General: No swelling or deformity.      Cervical back: Normal range of motion and neck supple.      Right lower leg: No edema.      Left lower leg: No edema.   Lymphadenopathy:      Cervical: No cervical adenopathy.   Skin:     General: Skin is warm and dry.      Capillary Refill: Capillary refill takes less than 2 seconds.      Coloration:  Skin is not jaundiced or pale.      Findings: No erythema or rash.   Neurological:      General: No focal deficit present.      Mental Status: He is alert and oriented to person, place, and  time.   Psychiatric:         Mood and Affect: Mood normal.         Behavior: Behavior normal.         Thought Content: Thought content normal.         Judgment: Judgment normal.        Results for orders placed or performed in visit on 36/64/40   METABOLIC PANEL, COMPREHENSIVE   Result Value Ref Range    Glucose 209 (H) 65 - 99 mg/dL    BUN 16 8 - 27 mg/dL    Creatinine 1.31 (H) 0.76 - 1.27 mg/dL    eGFR 59 (L) >59 mL/min/1.73    BUN/Creatinine ratio 12 10 - 24    Sodium 140 134 - 144 mmol/L    Potassium 4.6 3.5 - 5.2 mmol/L    Chloride 102 96 - 106 mmol/L    CO2 21 20 - 29 mmol/L    Calcium 9.0 8.6 - 10.2 mg/dL    Protein, total 7.2 6.0 - 8.5 g/dL    Albumin 4.1 3.8 - 4.8 g/dL    GLOBULIN, TOTAL 3.1 1.5 - 4.5 g/dL    A-G Ratio 1.3 1.2 - 2.2    Bilirubin, total 0.3 0.0 - 1.2 mg/dL    Alk. phosphatase 62 44 - 121 IU/L    AST (SGOT) 33 0 - 40 IU/L    ALT (SGPT) 14 0 - 44 IU/L   CBC W/O DIFF   Result Value Ref Range    WBC 7.9 3.4 - 10.8 x10E3/uL    RBC 4.28 4.14 - 5.80 x10E6/uL    HGB 11.9 (L) 13.0 - 17.7 g/dL    HCT 36.5 (L) 37.5 - 51.0 %    MCV 85 79 - 97 fL    MCH 27.8 26.6 - 33.0 pg    MCHC 32.6 31.5 - 35.7 g/dL    RDW 14.4 11.6 - 15.4 %    PLATELET 283 150 - 450 x10E3/uL   LIPID PANEL   Result Value Ref Range    Cholesterol, total 134 100 - 199 mg/dL    Triglyceride 199 (H) 0 - 149 mg/dL    HDL Cholesterol 34 (L) >39 mg/dL    VLDL, calculated 33 5 - 40 mg/dL    LDL, calculated 67 0 - 99 mg/dL       Assessment/Plan:    ICD-10-CM ICD-9-CM    1. Implantable cardioverter-defibrillator (ICD) in situ  Z95.810 V45.02 ECHO ADULT COMPLETE      REFERRAL TO CARDIOLOGY      2. Severe obesity (BMI 35.0-39.9) with comorbidity ([place])  E66.01 278.01       3. Stage 3a chronic kidney disease (HCC)  H47.42 595.6 METABOLIC PANEL, COMPREHENSIVE      4. Uncontrolled type 2 diabetes mellitus with hyperglycemia (HCC)  L87.56 433.29 METABOLIC PANEL, COMPREHENSIVE      MICROALBUMIN, UR, RAND W/ MICROALB/CREAT RATIO      empagliflozin  (Jardiance) 10 mg tablet      DISCONTINUED: semaglutide (OZEMPIC) 0.25 mg or 0.5 mg/dose (2 mg/1.5 ml) subq pen      5. Chronic congestive heart failure, unspecified heart failure type ([place])  I50.9 428.0 ECHO ADULT COMPLETE  REFERRAL TO CARDIOLOGY      carvediloL (COREG) 6.25 mg tablet      empagliflozin (Jardiance) 10 mg tablet      6. Longstanding persistent atrial fibrillation (HCC)  I48.11 427.31 ECHO ADULT COMPLETE      REFERRAL TO CARDIOLOGY      carvediloL (COREG) 6.25 mg tablet      apixaban (Eliquis) 5 mg tablet      amiodarone (CORDARONE) 200 mg tablet      7. Encounter for monitoring amiodarone therapy  Z51.81 V58.83 TSH 3RD GENERATION    Z79.899 V58.69       8. Parkinson disease (HCC)  G20 332.0 carbidopa-levodopa ER (SINEMET CR) 50-200 mg per tablet      9. Panlobular emphysema (HCC)  J43.1 492.8 fluticasone-umeclidinium-vilanterol (Trelegy Ellipta) 100-62.5-25 mcg inhaler      albuterol (PROVENTIL HFA, VENTOLIN HFA, PROAIR HFA) 90 mcg/actuation inhaler      10. Current use of long term anticoagulation  Z79.01 V58.61 omeprazole (PRILOSEC) 40 mg capsule      11. Coronary artery disease involving native coronary artery of native heart without angina pectoris  I25.10 414.01 ranolazine ER (RANEXA) 500 mg SR tablet      12. Type 2 diabetes mellitus with hyperlipidemia (HCC)  E11.69 250.80 LIPID PANEL    E78.5 272.4 DISCONTINUED: simvastatin (ZOCOR) 20 mg tablet      13. Type 2 diabetes mellitus with diabetic neuropathy, without long-term current use of insulin (HCC)  E11.40 250.60 ANKLE BRACHIAL INDEX     357.2 gabapentin (NEURONTIN) 100 mg capsule      REFERRAL TO PODIATRY      14. Need for hepatitis C screening test  Z11.59 V73.89 HEPATITIS C AB      15. Osteoporosis, unspecified osteoporosis type, unspecified pathological fracture presence  M81.0 733.00 alendronate (FOSAMAX) 70 mg tablet      DEXA BONE DENSITY STUDY AXIAL        Orders Placed This Encounter    DEXA BONE DENSITY STUDY AXIAL      Standing Status:   Future     Number of Occurrences:   1     Standing Expiration Date:   75/64/3329    METABOLIC PANEL, COMPREHENSIVE    LIPID PANEL    MICROALBUMIN, UR, RAND W/ MICROALB/CREAT RATIO    HEPATITIS C AB    TSH 3RD GENERATION    Manuella Ghazi, S Electrophysiology (EP) [facility]     Referral Priority:   Routine     Referral Type:   Consultation     Referral Reason:   Specialty Services Required     Referred to Provider:   Charna Elizabeth, MD     Number of Visits Requested:   1    [facility]     Referral Priority:   Routine     Referral Type:   Consultation     Referral Reason:   Specialty Services Required     Referred to Provider:   Salomon Fick, DPM     Number of Visits Requested:   1    ANKLE BRACHIAL INDEX     Standing Status:   Future     Number of Occurrences:   1     Standing Expiration Date:   11/15/2021    gabapentin (NEURONTIN) 100 mg capsule     Sig: Take 1 Capsule by mouth two (2) times a day for 90 days. Max Daily Amount: 200 mg.     Dispense:  60  Capsule     Refill:  2    DISCONTD: simvastatin (ZOCOR) 20 mg tablet     Sig: Take 1 Tablet by mouth daily.     Dispense:  30 Tablet     Refill:  0    ranolazine ER (RANEXA) 500 mg SR tablet     Sig: Take 1 Tablet by mouth two (2) times a day.     Dispense:  180 Tablet     Refill:  2    omeprazole (PRILOSEC) 40 mg capsule     Sig: Take 1 Capsule by mouth Daily (before breakfast) for 180 days.     Dispense:  90 Capsule     Refill:  1    fluticasone-umeclidinium-vilanterol (Trelegy Ellipta) 100-62.5-25 mcg inhaler     Sig: INHALE 1 PUFF BY MOUTH DAILY.     Dispense:  28 Blister     Refill:  5    carvediloL (COREG) 6.25 mg tablet     Sig: Take 1 Tablet by mouth two (2) times a day for 180 days.     Dispense:  180 Tablet     Refill:  1    carbidopa-levodopa ER (SINEMET CR) 50-200 mg per tablet     Sig: Take 2 Tablets by mouth three (3) times daily for 180 days.     Dispense:  540 Tablet     Refill:  1    apixaban (Eliquis) 5 mg tablet      Sig: Take 1 Tablet by mouth two (2) times a day for 180 days. TAKE 1 TABLET BY MOUTH TWICE DAILY.     Dispense:  180 Tablet     Refill:  1    amiodarone (CORDARONE) 200 mg tablet     Sig: Take 1 Tablet by mouth daily for 180 days.     Dispense:  90 Tablet     Refill:  1    alendronate (FOSAMAX) 70 mg tablet     Sig: 1 TAB WEEKLY W/6-8OZS WATER 30MINS PRIOR TO 1ST MED/FOOD/ BEVERAGE-DO NOT LIE DOWN FOR 30 MINS     Dispense:  12 Tablet     Refill:  1    albuterol (PROVENTIL HFA, VENTOLIN HFA, PROAIR HFA) 90 mcg/actuation inhaler     Sig: INHALE 2 PUFFS EVERY SIX HOURS AS NEEDED     Dispense:  18 g     Refill:  4    DISCONTD: semaglutide (OZEMPIC) 0.25 mg or 0.5 mg/dose (2 mg/1.5 ml) subq pen     Sig: 0.5 mg by SubCUTAneous route every seven (7) days for 90 days. Start with 0.25 mg the first 2 weeks, then increase to 0.5 mg.     Dispense:  3 Pen     Refill:  0    empagliflozin (Jardiance) 10 mg tablet     Sig: Take 1 Tablet by mouth daily for 90 days.     Dispense:  90 Tablet     Refill:  0     lose weight, increase physical activity, follow low fat diet, follow low salt diet, routine labs ordered, call if any problems  There are no Patient Instructions on file for this visit.   Follow-up and Dispositions    Return in about 3 months (around 08/17/2021) for 30 minutes, diabetes, hypertension, hf, afib, emphysema.

## 2021-05-18 NOTE — Progress Notes (Signed)
 1. Have you been to the ER, urgent care clinic since your last visit?  Hospitalized since your last visit? No    2. Have you seen or consulted any other health care providers outside of the [facility] since your last visit? No     3. For patients aged 71-75: Has the patient had a colonoscopy / FIT/ Cologuard? Yes - no Care Gap present      If the patient is male:    4. For patients aged 15-74: Has the patient had a mammogram within the past 2 years? NA - based on age or sex      1. For patients aged 21-65: Has the patient had a pap smear? NA - based on age or sex      Chief Complaint   Patient presents with    Medication Refill        Visit Vitals  BP (!) 111/54 (BP 1 Location: Right upper arm, BP Patient Position: Sitting, BP Cuff Size: Adult)   Pulse 82   Resp 18   Ht 5' 11 (1.803 m)   Wt 271 lb (122.9 kg)   SpO2 91%   BMI 37.80 kg/m

## 2021-05-25 ENCOUNTER — Encounter: Payer: MEDICARE | Attending: Student in an Organized Health Care Education/Training Program | Primary: Internal Medicine

## 2021-05-28 ENCOUNTER — Encounter

## 2021-05-28 NOTE — Telephone Encounter (Signed)
[facility] faxed refill request for the following medication:      Fenofibrate 145 MG Tab  QTY: 30  Take 1 tablet by mouth daily      Trintellix 20 MG Tablet  QTY: 30  Take one tablet by mouth  daily          LOV   05-18-2021  NOV 08-27-2021

## 2021-05-28 NOTE — Telephone Encounter (Signed)
Sent to Dr.Madrid

## 2021-05-30 MED ORDER — VORTIOXETINE 20 MG TABLET
20 mg | ORAL_TABLET | Freq: Every day | ORAL | 0 refills | Status: AC
Start: 2021-05-30 — End: 2021-08-06

## 2021-06-09 ENCOUNTER — Encounter

## 2021-06-11 ENCOUNTER — Inpatient Hospital Stay: Admit: 2021-06-11 | Payer: MEDICARE | Attending: Internal Medicine | Primary: Internal Medicine

## 2021-06-11 DIAGNOSIS — E1140 Type 2 diabetes mellitus with diabetic neuropathy, unspecified: Secondary | ICD-10-CM

## 2021-06-11 DIAGNOSIS — M810 Age-related osteoporosis without current pathological fracture: Secondary | ICD-10-CM

## 2021-06-11 DIAGNOSIS — Z95810 Presence of automatic (implantable) cardiac defibrillator: Secondary | ICD-10-CM

## 2021-06-11 NOTE — Progress Notes (Signed)
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   He's already on apixaban which helps for this, we need to change the simvastatin to a higher potency statin, to start rosuvastatin instead, sending new prescription. No need for anything else asides from medical management and surveillance.   Thanks

## 2021-06-11 NOTE — Progress Notes (Signed)
Overall, echocardiogram is normal.   There is mild regurgitation of aortic valve which is not a significant finding.   Thanks

## 2021-06-12 LAB — ANKLE BRACHIAL INDEX
Left ABI: 1.01
Left TBI: 0.68
Left arm BP: 148 mmHg
Left dorsalis pedis BP: 139 mmHg
Left posterior tibial: 149 mmHg
Left toe pressure: 100 mmHg
Right ABI: 1.05
Right TBI: 0.68
Right arm BP: 142 mmHg
Right dorsalis pedis BP: 148 mmHg
Right posterior tibial: 156 mmHg
Right toe pressure: 100 mmHg

## 2021-06-12 LAB — VAS ANKLE BRACHIAL INDEX (ABI)
Left ABI: 1.01
Left TBI: 0.68
Left arm BP: 148 mmHg
Left dorsalis pedis BP: 139 mmHg
Left posterior tibial: 149 mmHg
Left toe pressure: 100 mmHg
Right ABI: 1.05
Right TBI: 0.68
Right arm BP: 142 mmHg
Right dorsalis pedis BP: 148 mmHg
Right posterior tibial: 156 mmHg
Right toe pressure: 100 mmHg

## 2021-06-12 MED ORDER — OZEMPIC 0.25 MG OR 0.5 MG (2 MG/1.5 ML) SUBCUTANEOUS PEN INJECTOR
0.25 mg or 0.5 mg(2 mg/1.5 mL) | PEN_INJECTOR | SUBCUTANEOUS | 0 refills | Status: DC
Start: 2021-06-12 — End: 2021-07-10

## 2021-06-12 MED ORDER — SIMVASTATIN 20 MG TAB
20 mg | ORAL_TABLET | ORAL | 3 refills | Status: DC
Start: 2021-06-12 — End: 2021-07-17

## 2021-07-08 ENCOUNTER — Encounter

## 2021-07-10 MED ORDER — OZEMPIC 0.25 MG OR 0.5 MG (2 MG/1.5 ML) SUBCUTANEOUS PEN INJECTOR
0.250.521.5 mg or 0.5 mg(2 mg/1.5 mL) | PEN_INJECTOR | SUBCUTANEOUS | 0 refills | Status: DC
Start: 2021-07-10 — End: 2021-10-02

## 2021-07-17 ENCOUNTER — Encounter

## 2021-07-17 ENCOUNTER — Encounter: Primary: Internal Medicine

## 2021-07-17 MED ORDER — ROSUVASTATIN 20 MG TAB
20 mg | ORAL_TABLET | Freq: Every evening | ORAL | 3 refills | Status: DC
Start: 2021-07-17 — End: 2021-11-24

## 2021-08-04 ENCOUNTER — Encounter

## 2021-08-07 MED ORDER — JARDIANCE 10 MG TABLET
10 mg | ORAL_TABLET | ORAL | 0 refills | Status: AC
Start: 2021-08-07 — End: 2021-10-29

## 2021-08-07 MED ORDER — TRINTELLIX 20 MG TABLET
20 mg | ORAL_TABLET | ORAL | 0 refills | Status: AC
Start: 2021-08-07 — End: 2021-10-29

## 2021-08-27 ENCOUNTER — Encounter: Attending: Internal Medicine | Primary: Internal Medicine

## 2021-08-29 ENCOUNTER — Encounter

## 2021-08-30 MED ORDER — GABAPENTIN 100 MG CAP
100 mg | ORAL_CAPSULE | ORAL | 2 refills | Status: DC
Start: 2021-08-30 — End: 2021-11-23

## 2021-09-09 ENCOUNTER — Encounter: Attending: Foot & Ankle Surgery | Primary: Internal Medicine

## 2021-09-09 ENCOUNTER — Emergency Department: Admit: 2021-09-09 | Payer: MEDICARE | Primary: Internal Medicine

## 2021-09-09 ENCOUNTER — Inpatient Hospital Stay
Admit: 2021-09-09 | Discharge: 2021-09-09 | Disposition: A | Discharge status: Home / Self Care | Payer: MEDICARE | Attending: Emergency Medicine

## 2021-09-09 DIAGNOSIS — M7551 Bursitis of right shoulder: Secondary | ICD-10-CM

## 2021-09-09 MED ORDER — LIDOCAINE HCL 4 % TOPICAL PATCH
4 % | Freq: Every day | CUTANEOUS | 0 refills | Status: AC
Start: 2021-09-09 — End: ?

## 2021-09-09 MED ORDER — NAPROXEN SODIUM 550 MG TAB
550 mg | ORAL_TABLET | Freq: Three times a day (TID) | ORAL | 0 refills | Status: AC
Start: 2021-09-09 — End: 2021-10-29

## 2021-09-09 MED ORDER — IBUPROFEN 600 MG TAB
600 mg | Freq: Four times a day (QID) | ORAL | Status: DC | PRN
Start: 2021-09-09 — End: 2021-09-09
  Administered 2021-09-09: 21:00:00 via ORAL

## 2021-09-09 MED FILL — IBUPROFEN 600 MG TAB: 600 mg | ORAL | Qty: 1

## 2021-09-09 NOTE — ED Provider Notes (Signed)
ED Provider Notes by Marcelyn BruinsZelensky, Sharni Negron K, MD at 09/09/21 1542                Author: Marcelyn BruinsZelensky, Michala Deblanc K, MD  Service: Emergency Medicine  Author Type: Physician       Filed: 09/09/21 1803  Date of Service: 09/09/21 1542  Status: Signed          Editor: Marcelyn BruinsZelensky, Shine Scrogham K, MD (Physician)               EMERGENCY DEPARTMENT HISTORY AND PHYSICAL EXAM           Date: 09/09/2021   Patient Name: Carlos Petersen        History of Presenting Illness          Chief Complaint       Patient presents with        ?  Shoulder Pain           History Provided By: Patient and Patient's Son      HPI: Carlos Petersen, 71 y.o. male with a past medical history significant for diabetes, hypertension, PE, COPD presents to the ED with cc of  right shoulder pain.  Patient denies any recent injury or falls, states that over the last 2 days he has noticed worsening pain to his right shoulder, swelling noted today to anterior shoulder area.  Pain on abduction and flexion of shoulder, no numbness  tingling in extremities.  No swelling to hand.      There are no other complaints, changes, or physical findings at this time.      PCP: Lewanda RifeMadrid Malo Bohorquez, Valeria, MD        No current facility-administered medications on file prior to encounter.          Current Outpatient Medications on File Prior to Encounter          Medication  Sig  Dispense  Refill           ?  gabapentin (NEURONTIN) 100 mg capsule  TAKE ONE CAPSULE BY MOUTH TWICE DAILY.  60 Capsule  2     ?  Jardiance 10 mg tablet  TAKE ONE TABLET BY MOUTH DAILY.  90 Tablet  0     ?  Trintellix 20 mg tablet  TAKE ONE TABLET BY MOUTH DAILY.  90 Tablet  0     ?  rosuvastatin (CRESTOR) 20 mg tablet  Take 1 Tablet by mouth nightly for 360 days.  90 Tablet  3     ?  semaglutide (Ozempic) 0.25 mg or 0.5 mg/dose (2 mg/1.5 ml) subq pen  0.5 mg by SubCUTAneous route every seven (7) days for 90 days.  3 Pen  0     ?  ranolazine ER (RANEXA) 500 mg SR tablet  Take 1 Tablet by mouth two (2) times a  day.  180 Tablet  2     ?  omeprazole (PRILOSEC) 40 mg capsule  Take 1 Capsule by mouth Daily (before breakfast) for 180 days.  90 Capsule  1     ?  fluticasone-umeclidinium-vilanterol (Trelegy Ellipta) 100-62.5-25 mcg inhaler  INHALE 1 PUFF BY MOUTH DAILY.  28 Blister  5     ?  carvediloL (COREG) 6.25 mg tablet  Take 1 Tablet by mouth two (2) times a day for 180 days.  180 Tablet  1     ?  carbidopa-levodopa ER (SINEMET CR) 50-200 mg per tablet  Take 2 Tablets by mouth three (  3) times daily for 180 days.  540 Tablet  1     ?  apixaban (Eliquis) 5 mg tablet  Take 1 Tablet by mouth two (2) times a day for 180 days. TAKE 1 TABLET BY MOUTH TWICE DAILY.  180 Tablet  1     ?  amiodarone (CORDARONE) 200 mg tablet  Take 1 Tablet by mouth daily for 180 days.  90 Tablet  1     ?  alendronate (FOSAMAX) 70 mg tablet  1 TAB WEEKLY W/6-8OZS WATER 30MINS PRIOR TO 1ST MED/FOOD/ BEVERAGE-DO NOT LIE DOWN FOR 30 MINS  12 Tablet  1           ?  albuterol (PROVENTIL HFA, VENTOLIN HFA, PROAIR HFA) 90 mcg/actuation inhaler  INHALE 2 PUFFS EVERY SIX HOURS AS NEEDED  18 g  4             Past History        Past Medical History:     Past Medical History:        Diagnosis  Date         ?  COPD (chronic obstructive pulmonary disease) (HCC)       ?  Depression       ?  Diabetes (HCC)       ?  Emphysema lung (HCC)       ?  Heart disease       ?  Hypercholesterolemia       ?  Parkinson disease (HCC)           ?  Pulmonary embolism [facility](HCC)             Past Surgical History:     Past Surgical History:         Procedure  Laterality  Date          ?  HX COLONOSCOPY         ?  HX HIP REPLACEMENT  Right  01/2016     ?  HX IMPLANTABLE CARDIOVERTER DEFIBRILLATOR         ?  HX PACEMAKER              ?  HX PACEMAKER PLACEMENT               Family History:   History reviewed. No pertinent family history.      Social History:     Social History          Tobacco Use         ?  Smoking status:  Every Day              Packs/day:  1.00         Years:  45.00          Pack years:  45.00         Types:  Cigarettes         ?  Smokeless tobacco:  Never       Substance Use Topics         ?  Alcohol use:  Never         ?  Drug use:  Never           Allergies:   No Known Allergies           Review of Systems        Review of Systems    Constitutional:  Negative for activity change, appetite change, chills and fever.  HENT:  Negative for congestion, sore throat and trouble swallowing.     Eyes:  Negative for photophobia and visual disturbance.    Respiratory:  Negative for cough, chest tightness and shortness of breath.     Cardiovascular:  Negative for chest pain and palpitations.    Gastrointestinal:  Negative for abdominal pain and nausea.    Genitourinary:  Negative for dysuria and flank pain.    Musculoskeletal:  Positive for arthralgias and myalgias . Negative for neck pain.    Skin:  Negative for color change and pallor.    Neurological:  Negative for dizziness, weakness, numbness and headaches.         Physical Exam        Physical Exam   Vitals and nursing note reviewed.    Constitutional:        Appearance: Normal appearance. He is normal weight.    HENT:       Head: Normocephalic and atraumatic.       Nose: Nose normal.       Mouth/Throat:       Mouth: Mucous membranes are moist.    Eyes:       Extraocular Movements: Extraocular movements intact.       Pupils: Pupils are equal, round, and reactive to light.     Cardiovascular:       Rate and Rhythm: Normal rate and regular rhythm.       Pulses: Normal pulses.       Heart sounds: Normal heart sounds.    Pulmonary:       Effort: Pulmonary effort is normal.       Breath sounds: Normal breath sounds.     Abdominal:       General: Abdomen is flat. Bowel sounds are normal.       Palpations: Abdomen is soft.     Musculoskeletal:          General: Swelling and tenderness  present. No deformity. Normal range of motion.         Arms:           Cervical back: Normal range of motion and neck supple.    Skin:      General: Skin is  warm and dry.       Capillary Refill: Capillary refill takes less than 2 seconds.    Neurological:       General: No focal deficit present.       Mental Status: He is alert and oriented to person, place, and time.       Cranial Nerves: No cranial nerve deficit.       Sensory: No sensory deficit.       Motor: No weakness.    Psychiatric:          Mood and Affect: Mood normal.          Behavior: Behavior normal.            Diagnostic Study Results        Labs -    No results found for this or any previous visit (from the past 12 hour(s)).      Radiologic Studies -    @lastxrresult @     CT Results  (Last 48 hours)             None                    CXR Results  (Last 48 hours)  None                          Medical Decision Making     I am the first provider for this patient.      I reviewed the vital signs, available nursing notes, past medical history, past surgical history, family history and social history.      Vital Signs-Reviewed the patient's vital signs.   Patient Vitals for the past 12 hrs:            Temp  Pulse  Resp  BP  SpO2            09/09/21 1502  97.8 ??F (36.6 ??C)  71  18  114/64  97 %           Records Reviewed: Nursing Notes      The patient presents with right shoulder pain with a differential diagnosis of humerus fracture, bursitis, effusion, septic joint         Provider Notes (Medical Decision Making):       MDM    71 year old male, history of hypertension diabetes A. fib presents emergency department for right shoulder pain and swelling x1 day denies any acute events no trauma.    Physical shows  chronically ill-appearing male, no distress, swelling noted  to anterior shoulder area, tender to palpation, pain on flexion and abduction, equal pulses no obvious deformity.    X-ray of right shoulder does not show any acute bony injury, arthritic changes noted, history and physical most consistent with  bursitis.    Ibuprofen given in ED, will discharge patient with short course of NSAID,  orthopedic follow-up, instructed patient to ice area, no heavy lifting, return to emergency department for any worsening arm pain, numbness tingling extremities.            ED Course:    Initial assessment performed. The patients presenting problems have been discussed, and they are in agreement with the care plan formulated and outlined with them.  I have encouraged them to ask questions as they arise throughout their visit.                PROCEDURES   Procedures             PLAN:   1.      Discharge Medication List as of 09/09/2021  4:08 PM                 START taking these medications          Details        naproxen sodium (Anaprox DS) 550 mg tablet  Take 1 Tablet by mouth three (3) times daily (with meals)., Normal, Disp-20 Tablet, R-0               lidocaine HCL 4 % ptmd  1 Patch by Apply Externally route daily., Normal, Disp-10 Each, R-0                        CONTINUE these medications which have NOT CHANGED          Details        gabapentin (NEURONTIN) 100 mg capsule  TAKE ONE CAPSULE BY MOUTH TWICE DAILY., Normal, Disp-60 Capsule, R-2               Jardiance 10 mg tablet  TAKE ONE TABLET BY MOUTH DAILY., Normal, Disp-90 Tablet, R-0  Trintellix 20 mg tablet  TAKE ONE TABLET BY MOUTH DAILY., Normal, Disp-90 Tablet, R-0               rosuvastatin (CRESTOR) 20 mg tablet  Take 1 Tablet by mouth nightly for 360 days., Normal, Disp-90 Tablet, R-3               semaglutide (Ozempic) 0.25 mg or 0.5 mg/dose (2 mg/1.5 ml) subq pen  0.5 mg by SubCUTAneous route every seven (7) days for 90 days., Normal, Disp-3 Pen, R-0               ranolazine ER (RANEXA) 500 mg SR tablet  Take 1 Tablet by mouth two (2) times a day., Normal, Disp-180 Tablet, R-2               omeprazole (PRILOSEC) 40 mg capsule  Take 1 Capsule by mouth Daily (before breakfast) for 180 days., Normal, Disp-90 Capsule, R-1               fluticasone-umeclidinium-vilanterol (Trelegy Ellipta) 100-62.5-25 mcg inhaler  INHALE 1 PUFF BY MOUTH  DAILY., Normal, Disp-28 Blister, R-5               carvediloL (COREG) 6.25 mg tablet  Take 1 Tablet by mouth two (2) times a day for 180 days., Normal, Disp-180 Tablet, R-1               carbidopa-levodopa ER (SINEMET CR) 50-200 mg per tablet  Take 2 Tablets by mouth three (3) times daily for 180 days., Normal, Disp-540 Tablet, R-1               apixaban (Eliquis) 5 mg tablet  Take 1 Tablet by mouth two (2) times a day for 180 days. TAKE 1 TABLET BY MOUTH TWICE DAILY., Normal, Disp-180 Tablet, R-1               amiodarone (CORDARONE) 200 mg tablet  Take 1 Tablet by mouth daily for 180 days., Normal, Disp-90 Tablet, R-1               alendronate (FOSAMAX) 70 mg tablet  1 TAB WEEKLY W/6-8OZS WATER PRIOR TO 1ST MED/FOOD/ BEVERAGE-DO NOT LIE DOWN FOR 30 MINS, Normal, Disp-12 Tablet, R-1               albuterol (PROVENTIL HFA, VENTOLIN HFA, PROAIR HFA) 90 mcg/actuation inhaler  INHALE 2 PUFFS EVERY SIX HOURS AS NEEDED, Normal, Disp-18 g, R-4                      2.      Follow-up Information                  Follow up With  Specialties  Details  Why  Contact Info              Melvern Sample, MD  Orthopedic Surgery  In 1 week  If symptoms worsen  [address]   [place] [address]   [phone]                     Return to ED if worse         Diagnosis        Clinical Impression:       1.  Bursitis of right shoulder

## 2021-09-09 NOTE — ED Notes (Signed)
Pt c/o R shoulder pain.   Sx began two days ago.  Reports falling about a year ago and injured R shoulder.

## 2021-09-24 ENCOUNTER — Encounter: Payer: MEDICARE | Attending: Foot & Ankle Surgery | Primary: Internal Medicine

## 2021-09-30 ENCOUNTER — Encounter

## 2021-09-30 LAB — ECHO ADULT COMPLETE
AR Max Velocity PISA: 3.8 m/s
AR PHT: 907 ms
AV Area by Peak Velocity: 2.2 cm2
AV Area by VTI: 2.2 cm2
AV Mean Gradient: 5 mmHg
AV Mean Gradient: 5 mmHg
AV Mean Velocity: 1.1 m/s
AV Peak Gradient: 9 mmHg
AV Peak Velocity: 1.5 m/s
AV VTI: 26.6 cm
AV Velocity Ratio: 0.53
Aortic Root: 3.5 cm
E/E' Lateral: 6.71
E/E' Ratio (Averaged): 8.06
E/E' Septal: 9.4
EF BP: 64 % (ref 55–100)
Fractional Shortening 2D: 23 % (ref 28–44)
IVSd: 1 cm (ref 0.6–1.0)
LA Area 2C: 22.8 cm2
LA Area 4C: 17.9 cm2
LA Major Axis: 5.3 cm
LA Minor Axis: 5.7 cm
LA Volume BP: 62 mL — AB (ref 18–58)
LV E' Lateral Velocity: 7 cm/s
LV E' Septal Velocity: 5 cm/s
LV EDV A2C: 159 mL
LV EDV A4C: 188 mL
LV ESV A2C: 75 mL
LV ESV A4C: 54 mL
LV Ejection Fraction A2C: 53 %
LV Ejection Fraction A4C: 71 %
LV Mass 2D: 227.7 g — AB (ref 88–224)
LV RWT Ratio: 0.45
LVIDd: 5.3 cm (ref 4.2–5.9)
LVIDs: 4.1 cm
LVOT Area: 3.8 cm2
LVOT Diameter: 2.2 cm
LVOT Mean Gradient: 1 mmHg
LVOT Mean Gradient: 1 mmHg
LVOT Peak Gradient: 3 mmHg
LVOT Peak Velocity: 0.8 m/s
LVOT SV: 58.1 ml
LVOT VTI: 15.3 cm
LVOT:AV VTI Index: 0.58
LVPWd: 1.2 cm — AB (ref 0.6–1.0)
MV E Velocity: 0.47 m/s
MV E Wave Deceleration Time: 187 ms
TAPSE: 2.1 cm (ref 1.7–?)
TR Max Velocity: 1.05 m/s
TR Peak Gradient: 4 mmHg

## 2021-09-30 LAB — TRANSTHORACIC ECHOCARDIOGRAM (TTE) COMPLETE (CONTRAST/BUBBLE/3D PRN)
AR Max Velocity PISA: 3.8 m/s
AR PHT: 907 ms
AV Area by Peak Velocity: 2.2 cm2
AV Area by VTI: 2.2 cm2
AV Mean Gradient: 5 mmHg
AV Mean Gradient: 5 mmHg
AV Mean Velocity: 1.1 m/s
AV Peak Gradient: 9 mmHg
AV Peak Velocity: 1.5 m/s
AV VTI: 26.6 cm
AV Velocity Ratio: 0.53
Aortic Root: 3.5 cm
E/E' Lateral: 6.71
E/E' Ratio (Averaged): 8.06
E/E' Septal: 9.4
EF BP: 64 % (ref 55–100)
Fractional Shortening 2D: 23 % (ref 28–44)
IVSd: 1 cm (ref 0.6–1)
LA Area 2C: 22.8 cm2
LA Area 4C: 17.9 cm2
LA Major Axis: 5.3 cm
LA Minor Axis: 5.7 cm
LA Volume BP: 62 mL — AB (ref 18–58)
LV E' Lateral Velocity: 7 cm/s
LV E' Septal Velocity: 5 cm/s
LV EDV A2C: 159 mL
LV EDV A4C: 188 mL
LV ESV A2C: 75 mL
LV ESV A4C: 54 mL
LV Ejection Fraction A2C: 53 %
LV Ejection Fraction A4C: 71 %
LV Mass 2D: 227.7 g — AB (ref 88–224)
LV RWT Ratio: 0.45
LVIDd: 5.3 cm (ref 4.2–5.9)
LVIDs: 4.1 cm
LVOT Area: 3.8 cm2
LVOT Diameter: 2.2 cm
LVOT Mean Gradient: 1 mmHg
LVOT Mean Gradient: 1 mmHg
LVOT Peak Gradient: 3 mmHg
LVOT Peak Velocity: 0.8 m/s
LVOT SV: 58.1 ml
LVOT VTI: 15.3 cm
LVOT:AV VTI Index: 0.58
LVPWd: 1.2 cm — AB (ref 0.6–1)
Left Ventricular Ejection Fraction: 53
MV E Velocity: 0.47 m/s
MV E Wave Deceleration Time: 187 ms
TAPSE: 2.1 cm
TR Max Velocity: 1.05 m/s
TR Peak Gradient: 4 mmHg

## 2021-10-01 ENCOUNTER — Encounter: Attending: Student in an Organized Health Care Education/Training Program | Primary: Internal Medicine

## 2021-10-02 MED ORDER — OZEMPIC 0.25 MG OR 0.5 MG (2 MG/1.5 ML) SUBCUTANEOUS PEN INJECTOR
0.25 mg or 0.5 mg(2 mg/1.5 mL) | PEN_INJECTOR | SUBCUTANEOUS | 0 refills | Status: DC
Start: 2021-10-02 — End: 2021-10-29

## 2021-10-27 ENCOUNTER — Encounter

## 2021-10-29 MED ORDER — ALENDRONATE 70 MG TAB
70 mg | ORAL_TABLET | ORAL | 3 refills | Status: DC
Start: 2021-10-29 — End: 2021-11-17

## 2021-10-29 MED ORDER — AMIODARONE 200 MG TAB
200 mg | ORAL_TABLET | ORAL | 3 refills | Status: DC
Start: 2021-10-29 — End: 2021-11-17

## 2021-10-29 MED ORDER — TRELEGY ELLIPTA 100 MCG-62.5 MCG-25 MCG POWDER FOR INHALATION
RESPIRATORY_TRACT | 5 refills | Status: DC
Start: 2021-10-29 — End: 2021-11-17

## 2021-10-29 MED ORDER — CARVEDILOL 6.25 MG TAB
6.25 mg | ORAL_TABLET | ORAL | 3 refills | Status: DC
Start: 2021-10-29 — End: 2021-11-17

## 2021-10-29 MED ORDER — TRINTELLIX 20 MG TABLET
20 mg | ORAL_TABLET | ORAL | 3 refills | Status: DC
Start: 2021-10-29 — End: 2021-11-17

## 2021-10-29 MED ORDER — OMEPRAZOLE 40 MG CAP, DELAYED RELEASE
40 mg | ORAL_CAPSULE | ORAL | 3 refills | Status: DC
Start: 2021-10-29 — End: 2021-11-17

## 2021-10-29 MED ORDER — ELIQUIS 5 MG TABLET
5 mg | ORAL_TABLET | ORAL | 3 refills | Status: DC
Start: 2021-10-29 — End: 2021-11-17

## 2021-10-29 MED ORDER — CARBIDOPA-LEVODOPA ER 50-200 MG TABLET, EXTENDED RELEASE
50-200 mg | ORAL_TABLET | Freq: Three times a day (TID) | ORAL | 3 refills | Status: DC
Start: 2021-10-29 — End: 2021-11-17

## 2021-10-29 MED ORDER — OZEMPIC 0.25 MG OR 0.5 MG (2 MG/3 ML) SUBCUTANEOUS PEN INJECTOR
0.25 mg or 0.5 mg (2 mg/3 mL) | SUBCUTANEOUS | 2 refills | Status: DC
Start: 2021-10-29 — End: 2021-11-17

## 2021-10-29 MED ORDER — JARDIANCE 10 MG TABLET
10 mg | ORAL_TABLET | ORAL | 0 refills | Status: DC
Start: 2021-10-29 — End: 2021-11-17

## 2021-11-02 NOTE — Telephone Encounter (Signed)
Spoke to son regarding test results. He verbalized understanding of these results,.

## 2021-11-02 NOTE — Telephone Encounter (Signed)
-----   Message from [facility] Fredericka Hilts, MD sent at 10/22/2021  4:36 PM EST -----  Overall, echocardiogram is normal.   There is mild regurgitation of aortic valve which is not a significant finding.   Thanks

## 2021-11-02 NOTE — Telephone Encounter (Signed)
Called son regarding pt echo results. Son says pt is weaker. He has no appetite. He has been falling a lot because he is so weak.  He drinks plenty according to son but eats very little food. He says his body temp seems to be staying low- between 94, and 97.  The pt has a room in his son's home that has been fixed up like an apartment. They have taken away the phone-??  But his keep the heat on like a sauna-80 degrees or more and constantly complains he is cold. Son is very worried. Please advise.Marland KitchenMarland Kitchen

## 2021-11-03 NOTE — Telephone Encounter (Signed)
 Dr. Lionell aware he is not taking pt to hospital as directed.

## 2021-11-03 NOTE — Telephone Encounter (Signed)
 Per Dr. Lionell instruction>pt needs to go to hospital.   I called son to tell him to go to ER/hospital.  His son stated I thought about what you ask about how much he was drinking and I gave him some gatorade. He drank a lot of water  with the powder gatorade. Now he is walking, he is not cold feeling, he is not falling, he is so much better.  I told his son to be sure  his dad stays hydrated and take him to hospital if symptoms return. He verbalized understanding of these instructions. I told him he can call rescue squad if need be. He says he understands. He ask for a follow up appointment.

## 2021-11-11 NOTE — Telephone Encounter (Signed)
 Son called saying pt is having trouble standing. He is very weak.  His arms are not moving full range of motion. No complains of sob, chest pain. Urine looks dark,  Informed son please take pt or call rescue squad to go to [facility]. Thank ytou

## 2021-11-12 NOTE — Telephone Encounter (Signed)
 Called pt son regarding pt. I was checking to see if they took him to  [facility] ER as instructed. N/A. Left message for son to call us  back please. Thank you

## 2021-11-12 NOTE — Telephone Encounter (Signed)
 Patient's son called stating all of his father's medications that were sent to to [facility] on 10/29/2021 need to be sent to [facility].      He stated that they canceled all of the medications at [facility] because they have to use [facility] and there is no cost.    Please resend all medications.

## 2021-11-12 NOTE — Telephone Encounter (Signed)
 Patient's son called to schedule appointment for his father and himself and stated that his father did not go to ER yesterday.  He said that once he got some sleep his legs started working again.

## 2021-11-17 ENCOUNTER — Encounter

## 2021-11-17 MED ORDER — AMIODARONE 200 MG TAB
200 mg | ORAL_TABLET | Freq: Every day | ORAL | 3 refills | Status: AC
Start: 2021-11-17 — End: ?

## 2021-11-17 MED ORDER — APIXABAN 5 MG TABLET
5 mg | ORAL_TABLET | Freq: Two times a day (BID) | ORAL | 3 refills | Status: AC
Start: 2021-11-17 — End: ?

## 2021-11-17 MED ORDER — CARVEDILOL 6.25 MG TAB
6.25 mg | ORAL_TABLET | Freq: Two times a day (BID) | ORAL | 3 refills | Status: AC
Start: 2021-11-17 — End: ?

## 2021-11-17 MED ORDER — OMEPRAZOLE 40 MG CAP, DELAYED RELEASE
40 mg | ORAL_CAPSULE | ORAL | 3 refills | Status: AC
Start: 2021-11-17 — End: ?

## 2021-11-17 MED ORDER — TRELEGY ELLIPTA 100 MCG-62.5 MCG-25 MCG POWDER FOR INHALATION
RESPIRATORY_TRACT | 5 refills | Status: AC
Start: 2021-11-17 — End: ?

## 2021-11-17 MED ORDER — OZEMPIC 0.25 MG OR 0.5 MG (2 MG/3 ML) SUBCUTANEOUS PEN INJECTOR
0.25 mg or 0.5 mg (2 mg/3 mL) | SUBCUTANEOUS | 2 refills | Status: AC
Start: 2021-11-17 — End: 2022-02-15

## 2021-11-17 MED ORDER — VORTIOXETINE 20 MG TABLET
20 mg | ORAL_TABLET | Freq: Every day | ORAL | 3 refills | Status: AC
Start: 2021-11-17 — End: 2021-12-24

## 2021-11-17 MED ORDER — EMPAGLIFLOZIN 10 MG TABLET
10 mg | ORAL_TABLET | Freq: Every day | ORAL | 0 refills | Status: AC
Start: 2021-11-17 — End: 2021-12-24

## 2021-11-17 MED ORDER — ALENDRONATE 70 MG TAB
70 mg | ORAL_TABLET | ORAL | 3 refills | Status: AC
Start: 2021-11-17 — End: ?

## 2021-11-17 MED ORDER — CARBIDOPA-LEVODOPA ER 50-200 MG TABLET, EXTENDED RELEASE
50-200 mg | ORAL_TABLET | Freq: Three times a day (TID) | ORAL | 3 refills | Status: AC
Start: 2021-11-17 — End: 2022-11-12

## 2021-11-19 ENCOUNTER — Inpatient Hospital Stay
Admit: 2021-11-19 | Discharge: 2021-11-19 | Disposition: A | Discharge status: Home / Self Care | Payer: MEDICARE | Attending: Emergency Medicine

## 2021-11-19 ENCOUNTER — Emergency Department: Admit: 2021-11-19 | Payer: MEDICARE | Primary: Internal Medicine

## 2021-11-19 DIAGNOSIS — R5383 Other fatigue: Secondary | ICD-10-CM

## 2021-11-19 LAB — URINALYSIS W/MICROSCOPIC
Bacteria: NEGATIVE /hpf
Bilirubin: NEGATIVE
Blood: NEGATIVE
Glucose: NEGATIVE mg/dL
Ketone: 5 mg/dL — AB
Leukocyte Esterase: NEGATIVE
Nitrites: NEGATIVE
Protein: 30 mg/dL — AB
Specific gravity: 1.019 (ref 1.003–1.030)
Urobilinogen: 4 EU/dL — ABNORMAL HIGH (ref 0.1–1.0)
pH (UA): 6 (ref 5.0–8.0)

## 2021-11-19 LAB — EKG, 12 LEAD, INITIAL
Atrial Rate: 70 {beats}/min
Calculated P Axis: 86 degrees
Calculated R Axis: 29 degrees
Calculated T Axis: 74 degrees
P-R Interval: 222 ms
Q-T Interval: 440 ms
QRS Duration: 122 ms
QTC Calculation (Bezet): 475 ms
Ventricular Rate: 70 {beats}/min

## 2021-11-19 LAB — METABOLIC PANEL, COMPREHENSIVE
A-G Ratio: 0.5 — ABNORMAL LOW (ref 1.1–2.2)
ALT (SGPT): 6 U/L — ABNORMAL LOW (ref 12–78)
AST (SGOT): 11 U/L — ABNORMAL LOW (ref 15–37)
Albumin: 2.3 g/dL — ABNORMAL LOW (ref 3.5–5.0)
Alk. phosphatase: 89 U/L (ref 45–117)
Anion gap: 3 mmol/L — ABNORMAL LOW (ref 5–15)
BUN/Creatinine ratio: 18 (ref 12–20)
BUN: 13 mg/dL (ref 6–20)
Bilirubin, total: 0.5 mg/dL (ref 0.2–1.0)
CO2: 25 mmol/L (ref 21–32)
Calcium: 8.1 mg/dL — ABNORMAL LOW (ref 8.5–10.1)
Chloride: 105 mmol/L (ref 97–108)
Creatinine: 0.72 mg/dL (ref 0.70–1.30)
Globulin: 4.9 g/dL — ABNORMAL HIGH (ref 2.0–4.0)
Glucose: 113 mg/dL — ABNORMAL HIGH (ref 65–100)
Potassium: 4.2 mmol/L (ref 3.5–5.1)
Protein, total: 7.2 g/dL (ref 6.4–8.2)
Sodium: 133 mmol/L — ABNORMAL LOW (ref 136–145)
eGFR: >60 mL/min/{1.73_m2} (ref >60)

## 2021-11-19 LAB — CBC WITH AUTOMATED DIFF
ABS. BASOPHILS: 0 10*3/uL (ref 0.0–0.1)
ABS. EOSINOPHILS: 0 10*3/uL (ref 0.0–0.4)
ABS. IMM. GRANS.: 0.1 10*3/uL — ABNORMAL HIGH (ref 0.00–0.04)
ABS. LYMPHOCYTES: 2.4 10*3/uL (ref 0.8–3.5)
ABS. MONOCYTES: 0.9 10*3/uL (ref 0.0–1.0)
ABS. NEUTROPHILS: 5.2 10*3/uL (ref 1.8–8.0)
ABSOLUTE NRBC: 0 10*3/uL (ref 0.00–0.01)
BASOPHILS: 1 % (ref 0–1)
EOSINOPHILS: 1 % (ref 0–7)
HCT: 33.7 % — ABNORMAL LOW (ref 36.6–50.3)
HGB: 10.1 g/dL — ABNORMAL LOW (ref 12.1–17.0)
IMMATURE GRANULOCYTES: 1 % — ABNORMAL HIGH (ref 0–0.5)
LYMPHOCYTES: 28 % (ref 12–49)
MCH: 24.4 PG — ABNORMAL LOW (ref 26.0–34.0)
MCHC: 30 g/dL (ref 30.0–36.5)
MCV: 81.4 FL (ref 80.0–99.0)
MONOCYTES: 11 % (ref 5–13)
MPV: 8.7 FL — ABNORMAL LOW (ref 8.9–12.9)
NEUTROPHILS: 58 % (ref 32–75)
NRBC: 0 PER 100 WBC
PLATELET: 217 10*3/uL (ref 150–400)
RBC: 4.14 M/uL (ref 4.10–5.70)
RDW: 17.6 % — ABNORMAL HIGH (ref 11.5–14.5)
WBC: 8.7 10*3/uL (ref 4.1–11.1)

## 2021-11-19 LAB — TROPONIN-HIGH SENSITIVITY: Troponin-High Sensitivity: 5 ng/L (ref 0–76)

## 2021-11-19 LAB — AMMONIA
Ammonia, plasma: 25 umol/L (ref <32)
Ammonia: 25 umol/L (ref <32)

## 2021-11-19 LAB — URINALYSIS WITH MICROSCOPIC
BACTERIA, URINE: NEGATIVE /hpf
Bilirubin, Urine: NEGATIVE
Blood, Urine: NEGATIVE
Glucose, Ur: NEGATIVE mg/dL
Ketones, Urine: 5 mg/dL — AB
Leukocyte Esterase, Urine: NEGATIVE
Nitrite, Urine: NEGATIVE
Protein, UA: 30 mg/dL — AB
Specific Gravity, UA: 1.019 (ref 1.003–1.030)
Urobilinogen, UA, POCT: 4 EU/dL — ABNORMAL HIGH (ref 0.1–1.0)
pH, UA: 6 (ref 5.0–8.0)

## 2021-11-19 LAB — EKG 12-LEAD
Atrial Rate: 70 {beats}/min
P Axis: 86 degrees
P-R Interval: 222 ms
Q-T Interval: 440 ms
QRS Duration: 122 ms
QTc Calculation (Bazett): 475 ms
R Axis: 29 degrees
T Axis: 74 degrees
Ventricular Rate: 70 {beats}/min

## 2021-11-19 LAB — CBC WITH AUTO DIFFERENTIAL
Basophils %: 1 % (ref 0–1)
Basophils Absolute: 0 10*3/uL (ref 0.0–0.1)
Eosinophils %: 1 % (ref 0–7)
Eosinophils Absolute: 0 10*3/uL (ref 0.0–0.4)
Granulocyte Absolute Count: 0.1 10*3/uL — ABNORMAL HIGH (ref 0.00–0.04)
Hematocrit: 33.7 % — ABNORMAL LOW (ref 36.6–50.3)
Hemoglobin: 10.1 g/dL — ABNORMAL LOW (ref 12.1–17.0)
Immature Granulocytes: 1 % — ABNORMAL HIGH (ref 0–0.5)
Lymphocytes %: 28 % (ref 12–49)
Lymphocytes Absolute: 2.4 10*3/uL (ref 0.8–3.5)
MCH: 24.4 PG — ABNORMAL LOW (ref 26.0–34.0)
MCHC: 30 g/dL (ref 30.0–36.5)
MCV: 81.4 FL (ref 80.0–99.0)
MPV: 8.7 FL — ABNORMAL LOW (ref 8.9–12.9)
Monocytes %: 11 % (ref 5–13)
Monocytes Absolute: 0.9 10*3/uL (ref 0.0–1.0)
NRBC Absolute: 0 10*3/uL (ref 0.00–0.01)
Neutrophils %: 58 % (ref 32–75)
Neutrophils Absolute: 5.2 10*3/uL (ref 1.8–8.0)
Nucleated RBCs: 0 PER 100 WBC
Platelets: 217 10*3/uL (ref 150–400)
RBC: 4.14 M/uL (ref 4.10–5.70)
RDW: 17.6 % — ABNORMAL HIGH (ref 11.5–14.5)
WBC: 8.7 10*3/uL (ref 4.1–11.1)

## 2021-11-19 LAB — COMPREHENSIVE METABOLIC PANEL
ALT: 6 U/L — ABNORMAL LOW (ref 12–78)
AST: 11 U/L — ABNORMAL LOW (ref 15–37)
Albumin/Globulin Ratio: 0.5 — ABNORMAL LOW (ref 1.1–2.2)
Albumin: 2.3 g/dL — ABNORMAL LOW (ref 3.5–5.0)
Alkaline Phosphatase: 89 U/L (ref 45–117)
Anion Gap: 3 mmol/L — ABNORMAL LOW (ref 5–15)
BUN: 13 mg/dL (ref 6–20)
Bun/Cre Ratio: 18 (ref 12–20)
CO2: 25 mmol/L (ref 21–32)
Calcium: 8.1 mg/dL — ABNORMAL LOW (ref 8.5–10.1)
Chloride: 105 mmol/L (ref 97–108)
Creatinine: 0.72 mg/dL (ref 0.70–1.30)
ESTIMATED GLOMERULAR FILTRATION RATE: >60 mL/min/{1.73_m2} (ref >60)
Globulin: 4.9 g/dL — ABNORMAL HIGH (ref 2.0–4.0)
Glucose: 113 mg/dL — ABNORMAL HIGH (ref 65–100)
Potassium: 4.2 mmol/L (ref 3.5–5.1)
Sodium: 133 mmol/L — ABNORMAL LOW (ref 136–145)
Total Bilirubin: 0.5 mg/dL (ref 0.2–1.0)
Total Protein: 7.2 g/dL (ref 6.4–8.2)

## 2021-11-19 LAB — TROPONIN, HIGH SENSITIVITY: Troponin, High Sensitivity: 5 ng/L (ref 0–76)

## 2021-11-19 MED ORDER — SODIUM CHLORIDE 0.9% BOLUS IV
0.9 % | Freq: Once | INTRAVENOUS | Status: AC
Start: 2021-11-19 — End: 2021-11-19
  Administered 2021-11-19: 15:00:00 via INTRAVENOUS

## 2021-11-19 MED FILL — SODIUM CHLORIDE 0.9 % IV: INTRAVENOUS | Qty: 1000

## 2021-11-19 NOTE — ED Notes (Signed)
 Pt arrives with increased weakness and lethargy. Son reports bilateral upper extremity weakness that is worsening. Hx of parkinsons

## 2021-11-23 ENCOUNTER — Encounter

## 2021-11-23 NOTE — Telephone Encounter (Signed)
 [facility] faxed refill request for the following medication:      Gabapentin  100 MG Cap

## 2021-11-24 ENCOUNTER — Encounter

## 2021-11-24 NOTE — Telephone Encounter (Signed)
 [facility] faxed refill request for the following medications:      rosuvastatin  (CRESTOR ) 20 mg tablet  QTY: 90      ranolazine  ER (RANEXA ) 500 mg SR tablet  QTY: 90

## 2021-11-25 MED ORDER — GABAPENTIN 100 MG CAP
100 mg | ORAL_CAPSULE | Freq: Two times a day (BID) | ORAL | 2 refills | Status: AC
Start: 2021-11-25 — End: 2021-12-23

## 2021-11-27 MED ORDER — ROSUVASTATIN 20 MG TAB
20 mg | ORAL_TABLET | Freq: Every evening | ORAL | 3 refills | Status: AC
Start: 2021-11-27 — End: 2022-11-22

## 2021-11-27 MED ORDER — RANOLAZINE SR 500 MG 12 HR TAB
500 mg | ORAL_TABLET | Freq: Two times a day (BID) | ORAL | 3 refills | Status: AC
Start: 2021-11-27 — End: ?

## 2021-11-29 NOTE — Telephone Encounter (Signed)
Patient's son wanted to make you aware patient seen at ER for extremity weakness and fatigue 11/19/2021.

## 2021-12-17 ENCOUNTER — Inpatient Hospital Stay
Admit: 2021-12-17 | Discharge: 2021-12-24 | Disposition: A | Discharge status: Home Health | Payer: MEDICARE | Attending: Internal Medicine | Admitting: Internal Medicine

## 2021-12-17 ENCOUNTER — Emergency Department: Admit: 2021-12-17 | Payer: MEDICARE | Primary: Internal Medicine

## 2021-12-17 DIAGNOSIS — S72002A Fracture of unspecified part of neck of left femur, initial encounter for closed fracture: Secondary | ICD-10-CM

## 2021-12-17 DIAGNOSIS — S72012A Unspecified intracapsular fracture of left femur, initial encounter for closed fracture: Secondary | ICD-10-CM

## 2021-12-17 LAB — URINALYSIS W/ RFLX MICROSCOPIC
Bilirubin, Urine: NEGATIVE
Bilirubin: NEGATIVE
Blood, Urine: NEGATIVE
Blood: NEGATIVE
Glucose, Ur: NEGATIVE mg/dL
Glucose: NEGATIVE mg/dL
Ketone: 5 mg/dL — AB
Ketones, Urine: 5 mg/dL — AB
Leukocyte Esterase, Urine: NEGATIVE
Leukocyte Esterase: NEGATIVE
Nitrite, Urine: NEGATIVE
Nitrites: NEGATIVE
Protein, UA: NEGATIVE mg/dL
Protein: NEGATIVE mg/dL
Specific Gravity, UA: 1.012 (ref 1.003–1.030)
Specific gravity: 1.012 (ref 1.003–1.030)
Urobilinogen, UA, POCT: 4 EU/dL — ABNORMAL HIGH (ref 0.1–1.0)
Urobilinogen: 4 EU/dL — ABNORMAL HIGH (ref 0.1–1.0)
pH (UA): 6 (ref 5.0–8.0)
pH, UA: 6 (ref 5.0–8.0)

## 2021-12-17 LAB — METABOLIC PANEL, COMPREHENSIVE
A-G Ratio: 0.4 — ABNORMAL LOW (ref 1.1–2.2)
ALT (SGPT): <6 U/L — ABNORMAL LOW (ref 12–78)
AST (SGOT): 15 U/L (ref 15–37)
Albumin: 2.1 g/dL — ABNORMAL LOW (ref 3.5–5.0)
Alk. phosphatase: 82 U/L (ref 45–117)
Anion gap: 6 mmol/L (ref 5–15)
BUN/Creatinine ratio: 14 (ref 12–20)
BUN: 10 mg/dL (ref 6–20)
Bilirubin, total: 0.5 mg/dL (ref 0.2–1.0)
CO2: 23 mmol/L (ref 21–32)
Calcium: 7.6 mg/dL — ABNORMAL LOW (ref 8.5–10.1)
Chloride: 105 mmol/L (ref 97–108)
Creatinine: 0.72 mg/dL (ref 0.70–1.30)
Globulin: 5.2 g/dL — ABNORMAL HIGH (ref 2.0–4.0)
Glucose: 107 mg/dL — ABNORMAL HIGH (ref 65–100)
Potassium: 3.8 mmol/L (ref 3.5–5.1)
Protein, total: 7.3 g/dL (ref 6.4–8.2)
Sodium: 134 mmol/L — ABNORMAL LOW (ref 136–145)
eGFR: >60 mL/min/{1.73_m2} (ref >60)

## 2021-12-17 LAB — ECHO ADULT COMPLETE
AR PHT: 601 ms
AV Area by Peak Velocity: 3 cm2
AV Area by VTI: 4 cm2
AV Cusp Mmode: 1.8 cm
AV Mean Gradient: 6 mmHg
AV Mean Velocity: 1.1 m/s
AV Peak Gradient: 10 mmHg
AV Peak Velocity: 1.6 m/s
AV VTI: 23.4 cm
AV Velocity Ratio: 0.88
AVA/BSA Peak Velocity: 1.3 cm2/m2
AVA/BSA VTI: 1.8 cm2/m2
Ao Root Index: 1.38 cm/m2
Aortic Root: 3.1 cm
Ascending Aorta Index: 1.47 cm/m2
E/E' Lateral: 5.3
E/E' Ratio (Averaged): 5.96
E/E' Septal: 6.63
EF BP: 62 % (ref 55–100)
Fractional Shortening 2D: 30 % (ref 28–44)
IVSd: 0.9 cm (ref 0.6–1.0)
LA Area 2C: 16.6 cm2
LA Area 4C: 18.2 cm2
LA Diameter: 3 cm
LA Major Axis: 5.3 cm
LA Minor Axis: 5.3 cm
LA Size Index: 1.34 cm/m2
LA Volume BP: 48 mL (ref 18–58)
LA Volume Index BP: 21 ml/m2 (ref 16–34)
LA/AO Root Ratio: 0.97
LV E' Lateral Velocity: 10 cm/s
LV E' Septal Velocity: 8 cm/s
LV EDV A2C: 56 mL
LV EDV A4C: 103 mL
LV EDV Index A2C: 25 mL/m2
LV EDV Index A4C: 46 mL/m2
LV ESV A2C: 18 mL
LV ESV A4C: 38 mL
LV ESV Index A2C: 8 mL/m2
LV ESV Index A4C: 17 mL/m2
LV Ejection Fraction A2C: 67 %
LV Ejection Fraction A4C: 63 %
LV Mass 2D Index: 91.7 g/m2 (ref 49–115)
LV Mass 2D: 205.5 g (ref 88–224)
LV RWT Ratio: 0.36
LVIDd Index: 2.5 cm/m2
LVIDd M-mode: 6.2 cm — AB (ref 4.2–5.9)
LVIDd: 5.6 cm (ref 4.2–5.9)
LVIDs Index: 1.74 cm/m2
LVIDs M-mode: 4.4 cm
LVIDs: 3.9 cm
LVOT Area: 3.5 cm2
LVOT Diameter: 2.1 cm
LVOT Mean Gradient: 4 mmHg
LVOT Peak Gradient: 8 mmHg
LVOT Peak Velocity: 1.4 m/s
LVOT SV: 92.4 ml
LVOT Stroke Volume Index: 41.3 mL/m2
LVOT VTI: 26.7 cm
LVOT:AV VTI Index: 1.14
LVPWd M-mode: 1.1 cm — AB (ref 0.6–1.0)
LVPWd: 1 cm (ref 0.6–1.0)
MV A Velocity: 0.61 m/s
MV E Velocity: 0.53 m/s
MV E Wave Deceleration Time: 222 ms
MV E/A: 0.87
PV Max Velocity: 1 m/s
PV Peak Gradient: 4 mmHg
RA Area 4C: 16.9 cm2
RA Volume Index A4C: 19 mL/m2
RA Volume: 43 ml
RV Basal Dimension: 3.7 cm
RV Longitudinal Dimension: 7.8 cm
RV Mid Dimension: 3.4 cm
RVOT Mean Gradient: 1 mmHg
RVOT Peak Gradient: 2 mmHg
RVOT Peak Velocity: 0.8 m/s
RVOT VTI: 17.7 cm
TAPSE: 1.9 cm (ref 1.7–?)
TR Max Velocity: 2.44 m/s
TR Peak Gradient: 24 mmHg

## 2021-12-17 LAB — GLUCOSE, POC: Glucose (POC): 104 mg/dL — ABNORMAL HIGH (ref 65–100)

## 2021-12-17 LAB — CBC WITH AUTOMATED DIFF
ABS. BASOPHILS: 0.1 10*3/uL (ref 0.0–0.1)
ABS. EOSINOPHILS: 0.3 10*3/uL (ref 0.0–0.4)
ABS. IMM. GRANS.: 0.1 10*3/uL — ABNORMAL HIGH (ref 0.00–0.04)
ABS. LYMPHOCYTES: 1.4 10*3/uL (ref 0.8–3.5)
ABS. MONOCYTES: 0.6 10*3/uL (ref 0.0–1.0)
ABS. NEUTROPHILS: 4.6 10*3/uL (ref 1.8–8.0)
ABSOLUTE NRBC: 0 10*3/uL (ref 0.00–0.01)
BASOPHILS: 1 % (ref 0–1)
EOSINOPHILS: 4 % (ref 0–7)
HCT: 27.5 % — ABNORMAL LOW (ref 36.6–50.3)
HGB: 8.1 g/dL — ABNORMAL LOW (ref 12.1–17.0)
IMMATURE GRANULOCYTES: 1 % — ABNORMAL HIGH (ref 0–0.5)
LYMPHOCYTES: 20 % (ref 12–49)
MCH: 23.5 PG — ABNORMAL LOW (ref 26.0–34.0)
MCHC: 29.5 g/dL — ABNORMAL LOW (ref 30.0–36.5)
MCV: 79.9 FL — ABNORMAL LOW (ref 80.0–99.0)
MONOCYTES: 9 % (ref 5–13)
MPV: 8.4 FL — ABNORMAL LOW (ref 8.9–12.9)
NEUTROPHILS: 65 % (ref 32–75)
NRBC: 0 PER 100 WBC
PLATELET: 257 10*3/uL (ref 150–400)
RBC: 3.44 M/uL — ABNORMAL LOW (ref 4.10–5.70)
RDW: 17 % — ABNORMAL HIGH (ref 11.5–14.5)
WBC: 7 10*3/uL (ref 4.1–11.1)

## 2021-12-17 LAB — EKG, 12 LEAD, INITIAL
Atrial Rate: 73 {beats}/min
Calculated R Axis: 40 degrees
Calculated T Axis: 27 degrees
P-R Interval: 258 ms
Q-T Interval: 454 ms
QRS Duration: 132 ms
QTC Calculation (Bezet): 500 ms
Ventricular Rate: 73 {beats}/min

## 2021-12-17 LAB — COMPREHENSIVE METABOLIC PANEL
ALT: <6 U/L — ABNORMAL LOW (ref 12–78)
AST: 15 U/L (ref 15–37)
Albumin/Globulin Ratio: 0.4 — ABNORMAL LOW (ref 1.1–2.2)
Albumin: 2.1 g/dL — ABNORMAL LOW (ref 3.5–5.0)
Alkaline Phosphatase: 82 U/L (ref 45–117)
Anion Gap: 6 mmol/L (ref 5–15)
BUN: 10 mg/dL (ref 6–20)
Bun/Cre Ratio: 14 (ref 12–20)
CO2: 23 mmol/L (ref 21–32)
Calcium: 7.6 mg/dL — ABNORMAL LOW (ref 8.5–10.1)
Chloride: 105 mmol/L (ref 97–108)
Creatinine: 0.72 mg/dL (ref 0.70–1.30)
ESTIMATED GLOMERULAR FILTRATION RATE: >60 mL/min/{1.73_m2} (ref >60)
Globulin: 5.2 g/dL — ABNORMAL HIGH (ref 2.0–4.0)
Glucose: 107 mg/dL — ABNORMAL HIGH (ref 65–100)
Potassium: 3.8 mmol/L (ref 3.5–5.1)
Sodium: 134 mmol/L — ABNORMAL LOW (ref 136–145)
Total Bilirubin: 0.5 mg/dL (ref 0.2–1.0)
Total Protein: 7.3 g/dL (ref 6.4–8.2)

## 2021-12-17 LAB — CBC WITH AUTO DIFFERENTIAL
Basophils %: 1 % (ref 0–1)
Basophils Absolute: 0.1 10*3/uL (ref 0.0–0.1)
Eosinophils %: 4 % (ref 0–7)
Eosinophils Absolute: 0.3 10*3/uL (ref 0.0–0.4)
Granulocyte Absolute Count: 0.1 10*3/uL — ABNORMAL HIGH (ref 0.00–0.04)
Hematocrit: 27.5 % — ABNORMAL LOW (ref 36.6–50.3)
Hemoglobin: 8.1 g/dL — ABNORMAL LOW (ref 12.1–17.0)
Immature Granulocytes: 1 % — ABNORMAL HIGH (ref 0–0.5)
Lymphocytes %: 20 % (ref 12–49)
Lymphocytes Absolute: 1.4 10*3/uL (ref 0.8–3.5)
MCH: 23.5 PG — ABNORMAL LOW (ref 26.0–34.0)
MCHC: 29.5 g/dL — ABNORMAL LOW (ref 30.0–36.5)
MCV: 79.9 FL — ABNORMAL LOW (ref 80.0–99.0)
MPV: 8.4 FL — ABNORMAL LOW (ref 8.9–12.9)
Monocytes %: 9 % (ref 5–13)
Monocytes Absolute: 0.6 10*3/uL (ref 0.0–1.0)
NRBC Absolute: 0 10*3/uL (ref 0.00–0.01)
Neutrophils %: 65 % (ref 32–75)
Neutrophils Absolute: 4.6 10*3/uL (ref 1.8–8.0)
Nucleated RBCs: 0 PER 100 WBC
Platelets: 257 10*3/uL (ref 150–400)
RBC: 3.44 M/uL — ABNORMAL LOW (ref 4.10–5.70)
RDW: 17 % — ABNORMAL HIGH (ref 11.5–14.5)
WBC: 7 10*3/uL (ref 4.1–11.1)

## 2021-12-17 LAB — ECHO (TTE) COMPLETE (PRN CONTRAST/BUBBLE/STRAIN/3D)
AR Max Velocity PISA: 3.3 m/s
AR PHT: 601 ms
AV Area by Peak Velocity: 3 cm2
AV Area by VTI: 4 cm2
AV Cusp Mmode: 1.8 cm
AV Mean Gradient: 6 mmHg
AV Mean Velocity: 1.1 m/s
AV Peak Gradient: 10 mmHg
AV Peak Velocity: 1.6 m/s
AV VTI: 23.4 cm
AV Velocity Ratio: 0.88
AVA/BSA Peak Velocity: 1.3 cm2/m2
AVA/BSA VTI: 1.8 cm2/m2
Ao Root Index: 1.38 cm/m2
Aortic Root: 3.1 cm
Ascending Aorta Index: 1.47 cm/m2
Ascending Aorta: 3.3 cm
E/E' Lateral: 5.3
E/E' Ratio (Averaged): 5.96
E/E' Septal: 6.63
EF BP: 62 % (ref 55–100)
Fractional Shortening 2D: 30 % (ref 28–44)
IVSd: 0.9 cm (ref 0.6–1)
LA Area 2C: 16.6 cm2
LA Area 4C: 18.2 cm2
LA Diameter: 3 cm
LA Major Axis: 5.3 cm
LA Minor Axis: 5.3 cm
LA Size Index: 1.34 cm/m2
LA Volume BP: 48 mL (ref 18–58)
LA Volume Index BP: 21 ml/m2 (ref 16–34)
LA/AO Root Ratio: 0.97
LV E' Lateral Velocity: 10 cm/s
LV E' Septal Velocity: 8 cm/s
LV EDV A2C: 56 mL
LV EDV A4C: 103 mL
LV EDV Index A2C: 25 mL/m2
LV EDV Index A4C: 46 mL/m2
LV ESV A2C: 18 mL
LV ESV A4C: 38 mL
LV ESV Index A2C: 8 mL/m2
LV ESV Index A4C: 17 mL/m2
LV Ejection Fraction A2C: 67 %
LV Ejection Fraction A4C: 63 %
LV Mass 2D Index: 91.7 g/m2 (ref 49–115)
LV Mass 2D: 205.5 g (ref 88–224)
LV RWT Ratio: 0.36
LVIDd Index: 2.5 cm/m2
LVIDd M-mode: 6.2 cm — AB (ref 4.2–5.9)
LVIDd: 5.6 cm (ref 4.2–5.9)
LVIDs Index: 1.74 cm/m2
LVIDs M-mode: 4.4 cm
LVIDs: 3.9 cm
LVOT Area: 3.5 cm2
LVOT Diameter: 2.1 cm
LVOT Mean Gradient: 4 mmHg
LVOT Peak Gradient: 8 mmHg
LVOT Peak Velocity: 1.4 m/s
LVOT SV: 92.4 ml
LVOT Stroke Volume Index: 41.3 mL/m2
LVOT VTI: 26.7 cm
LVOT:AV VTI Index: 1.14
LVPWd M-mode: 1.1 cm — AB (ref 0.6–1)
LVPWd: 1 cm (ref 0.6–1)
MV A Velocity: 0.61 m/s
MV E Velocity: 0.53 m/s
MV E Wave Deceleration Time: 222 ms
MV E/A: 0.87
PV Max Velocity: 1 m/s
PV Peak Gradient: 4 mmHg
RA Area 4C: 16.9 cm2
RA Volume Index A4C: 19 mL/m2
RA Volume: 43 ml
RV Basal Dimension: 3.7 cm
RV Longitudinal Dimension: 7.8 cm
RV Mid Dimension: 3.4 cm
RVOT Mean Gradient: 1 mmHg
RVOT Peak Gradient: 2 mmHg
RVOT Peak Velocity: 0.8 m/s
RVOT VTI: 17.7 cm
TAPSE: 1.9 cm
TR Max Velocity: 2.44 m/s
TR Peak Gradient: 24 mmHg

## 2021-12-17 LAB — EKG 12-LEAD
Atrial Rate: 73 {beats}/min
P-R Interval: 258 ms
Q-T Interval: 454 ms
QRS Duration: 132 ms
QTc Calculation (Bazett): 500 ms
R Axis: 40 degrees
T Axis: 27 degrees
Ventricular Rate: 73 {beats}/min

## 2021-12-17 LAB — POCT GLUCOSE: POC Glucose: 104 mg/dL — ABNORMAL HIGH (ref 65–100)

## 2021-12-17 MED ORDER — PANTOPRAZOLE 40 MG TAB, DELAYED RELEASE
40 mg | Freq: Every day | ORAL | Status: AC
Start: 2021-12-17 — End: 2021-12-24
  Administered 2021-12-18 – 2021-12-24 (×7): via ORAL

## 2021-12-17 MED ORDER — TIOTROPIUM BROMIDE 2.5 MCG/ACTUATION MIST FOR INHALATION
2.5 mcg/actuation | Freq: Every day | RESPIRATORY_TRACT | Status: AC
Start: 2021-12-17 — End: 2021-12-24
  Administered 2021-12-18 – 2021-12-24 (×6): via RESPIRATORY_TRACT

## 2021-12-17 MED ORDER — ONDANSETRON (PF) 4 MG/2 ML INJECTION
4 mg/2 mL | Freq: Four times a day (QID) | INTRAMUSCULAR | Status: AC | PRN
Start: 2021-12-17 — End: 2021-12-24
  Administered 2021-12-20: 02:00:00 via INTRAVENOUS

## 2021-12-17 MED ORDER — SODIUM CHLORIDE 0.9 % IJ SYRG
Freq: Three times a day (TID) | INTRAMUSCULAR | Status: DC
Start: 2021-12-17 — End: 2021-12-17

## 2021-12-17 MED ORDER — CARVEDILOL 3.125 MG TAB
3.125 mg | Freq: Two times a day (BID) | ORAL | Status: AC
Start: 2021-12-17 — End: 2021-12-24
  Administered 2021-12-18 – 2021-12-24 (×13): via ORAL

## 2021-12-17 MED ORDER — BUDESONIDE-FORMOTEROL HFA 160 MCG-4.5 MCG/ACTUATION AEROSOL INHALER
Freq: Two times a day (BID) | RESPIRATORY_TRACT | Status: AC
Start: 2021-12-17 — End: 2021-12-24
  Administered 2021-12-18 – 2021-12-24 (×13): via RESPIRATORY_TRACT

## 2021-12-17 MED ORDER — CARBIDOPA-LEVODOPA ER 25-100 MG TABLET, EXTENDED RELEASE
25-100 mg | Freq: Three times a day (TID) | ORAL | Status: AC
Start: 2021-12-17 — End: 2021-12-24
  Administered 2021-12-17 – 2021-12-24 (×19): via ORAL

## 2021-12-17 MED ORDER — MORPHINE 2 MG/ML INJECTION
2 mg/mL | Freq: Four times a day (QID) | INTRAMUSCULAR | Status: AC | PRN
Start: 2021-12-17 — End: 2021-12-19
  Administered 2021-12-18: 13:00:00 via INTRAVENOUS

## 2021-12-17 MED ORDER — SODIUM CHLORIDE 0.9 % IJ SYRG
INTRAMUSCULAR | Status: AC | PRN
Start: 2021-12-17 — End: 2021-12-24

## 2021-12-17 MED ORDER — DEXTROSE 10% IN WATER (D10W) IV
10 % | INTRAVENOUS | Status: AC | PRN
Start: 2021-12-17 — End: 2021-12-24

## 2021-12-17 MED ORDER — ROSUVASTATIN 5 MG TAB
5 mg | Freq: Every evening | ORAL | Status: AC
Start: 2021-12-17 — End: 2021-12-24
  Administered 2021-12-18 – 2021-12-24 (×7): via ORAL

## 2021-12-17 MED ORDER — GLUCOSE 4 GRAM CHEWABLE TAB
4 gram | ORAL | Status: AC | PRN
Start: 2021-12-17 — End: 2021-12-24

## 2021-12-17 MED ORDER — ALBUTEROL SULFATE HFA 90 MCG/ACTUATION AEROSOL INHALER
90 mcg/actuation | Freq: Four times a day (QID) | RESPIRATORY_TRACT | Status: DC | PRN
Start: 2021-12-17 — End: 2021-12-24

## 2021-12-17 MED ORDER — AMIODARONE 200 MG TAB
200 mg | Freq: Every day | ORAL | Status: DC
Start: 2021-12-17 — End: 2021-12-24
  Administered 2021-12-18 – 2021-12-24 (×7): via ORAL

## 2021-12-17 MED ORDER — ONDANSETRON 4 MG TAB, RAPID DISSOLVE
4 mg | Freq: Three times a day (TID) | ORAL | Status: AC | PRN
Start: 2021-12-17 — End: 2021-12-24

## 2021-12-17 MED ORDER — ENOXAPARIN 30 MG/0.3 ML SUB-Q SYRINGE
30 mg/0.3 mL | Freq: Two times a day (BID) | SUBCUTANEOUS | Status: AC
Start: 2021-12-17 — End: 2021-12-18

## 2021-12-17 MED ORDER — CELECOXIB 200 MG CAP
200 mg | Freq: Two times a day (BID) | ORAL | Status: AC
Start: 2021-12-17 — End: 2021-12-24
  Administered 2021-12-17 – 2021-12-24 (×15): via ORAL

## 2021-12-17 MED ORDER — MORPHINE 2 MG/ML INJECTION
2 mg/mL | INTRAMUSCULAR | Status: DC | PRN
Start: 2021-12-17 — End: 2021-12-17

## 2021-12-17 MED ORDER — ENOXAPARIN 30 MG/0.3 ML SUB-Q SYRINGE
30 mg/0.3 mL | Freq: Two times a day (BID) | SUBCUTANEOUS | Status: DC
Start: 2021-12-17 — End: 2021-12-17

## 2021-12-17 MED ORDER — GABAPENTIN 100 MG CAP
100 mg | Freq: Two times a day (BID) | ORAL | Status: AC
Start: 2021-12-17 — End: 2021-12-23
  Administered 2021-12-18 – 2021-12-23 (×12): via ORAL

## 2021-12-17 MED ORDER — TRAMADOL 50 MG TAB
50 mg | Freq: Four times a day (QID) | ORAL | Status: DC | PRN
Start: 2021-12-17 — End: 2021-12-18
  Administered 2021-12-17 – 2021-12-18 (×3): via ORAL

## 2021-12-17 MED ORDER — VORTIOXETINE 20 MG TABLET
20 mg | Freq: Every day | ORAL | Status: DC
Start: 2021-12-17 — End: 2021-12-20

## 2021-12-17 MED ORDER — POLYETHYLENE GLYCOL 3350 17 GRAM (100 %) ORAL POWDER PACKET
17 gram | Freq: Every day | ORAL | Status: AC | PRN
Start: 2021-12-17 — End: 2021-12-24

## 2021-12-17 MED ORDER — RANOLAZINE SR 500 MG 12 HR TAB
500 mg | Freq: Two times a day (BID) | ORAL | Status: DC
Start: 2021-12-17 — End: 2021-12-24
  Administered 2021-12-18 – 2021-12-24 (×14): via ORAL

## 2021-12-17 MED ORDER — ACETAMINOPHEN 650 MG RECTAL SUPPOSITORY
650 mg | Freq: Four times a day (QID) | RECTAL | Status: DC | PRN
Start: 2021-12-17 — End: 2021-12-17

## 2021-12-17 MED ORDER — ACETAMINOPHEN 325 MG TABLET
325 mg | Freq: Four times a day (QID) | ORAL | Status: AC | PRN
Start: 2021-12-17 — End: 2021-12-24

## 2021-12-17 MED ORDER — INSULIN LISPRO 100 UNIT/ML INJECTION
100 unit/mL | Freq: Four times a day (QID) | SUBCUTANEOUS | Status: AC
Start: 2021-12-17 — End: 2021-12-24

## 2021-12-17 MED ORDER — EMPAGLIFLOZIN 10 MG TABLET
10 mg | Freq: Every day | ORAL | Status: AC
Start: 2021-12-17 — End: 2021-12-24
  Administered 2021-12-18 – 2021-12-24 (×7): via ORAL

## 2021-12-17 MED ORDER — HYDROCODONE-ACETAMINOPHEN 5 MG-325 MG TAB
5-325 mg | Freq: Once | ORAL | Status: AC
Start: 2021-12-17 — End: 2021-12-17
  Administered 2021-12-17: 14:00:00 via ORAL

## 2021-12-17 MED ORDER — GLUCAGON 1 MG INJECTION
1 mg | INTRAMUSCULAR | Status: AC | PRN
Start: 2021-12-17 — End: 2021-12-24

## 2021-12-17 MED FILL — SODIUM CHLORIDE 0.9 % IJ SYRG: INTRAMUSCULAR | Qty: 40

## 2021-12-17 MED FILL — ROSUVASTATIN 20 MG TAB: 20 mg | ORAL | Qty: 1

## 2021-12-17 MED FILL — CELECOXIB 200 MG CAP: 200 mg | ORAL | Qty: 1

## 2021-12-17 MED FILL — HYDROCODONE-ACETAMINOPHEN 5 MG-325 MG TAB: 5-325 mg | ORAL | Qty: 1

## 2021-12-17 MED FILL — GABAPENTIN 100 MG CAP: 100 mg | ORAL | Qty: 1

## 2021-12-17 MED FILL — CARBIDOPA-LEVODOPA ER 25-100 MG TABLET, EXTENDED RELEASE: 25-100 mg | ORAL | Qty: 4

## 2021-12-17 MED FILL — RANOLAZINE SR 500 MG 12 HR TAB: 500 mg | ORAL | Qty: 1

## 2021-12-17 MED FILL — DEXTROSE 10% IN WATER (D10W) IV: 10 % | INTRAVENOUS | Qty: 250

## 2021-12-17 MED FILL — TRAMADOL 50 MG TAB: 50 mg | ORAL | Qty: 1

## 2021-12-17 MED FILL — CARVEDILOL 3.125 MG TAB: 3.125 mg | ORAL | Qty: 2

## 2021-12-17 NOTE — ED Notes (Signed)
Pt transported to [facility] at this time, on tele box, with edt.

## 2021-12-17 NOTE — Progress Notes (Signed)
Admission Medication Reconciliation:    Information obtained from:  Patient   RxQuery data available:  YES    Comments/Recommendations: Updated PTA meds/reviewed patient's allergies.    1)  Patient is good historian     2)  Medication changes (since last review):  Added  - N/A    Adjusted  - N/A    Removed  - Removed lidocaine 4% patch  Removed ozempic 0.25 mg inj    Pharmacy: [facility] pharmacy benefit data reflects medications filled and processed through the patient's insurance, however   this data does NOT capture whether the medication was picked up or is currently being taken by the patient.    Prior to Admission Medications   Prescriptions Last Dose Informant Taking?   albuterol (PROVENTIL HFA, VENTOLIN HFA, PROAIR HFA) 90 mcg/actuation inhaler  Self Yes   Sig: Take 2 Puffs by inhalation every six (6) hours as needed for Wheezing.   alendronate (FOSAMAX) 70 mg tablet  Self Yes   Sig: Take 1 Tablet by mouth every Wednesday.   amiodarone (CORDARONE) 200 mg tablet  Self Yes   Sig: Take 1 Tablet by mouth daily.   apixaban (Eliquis) 5 mg tablet  Self Yes   Sig: Take 1 Tablet by mouth two (2) times a day.   carbidopa-levodopa ER (SINEMET CR) 50-200 mg per tablet  Self Yes   Sig: Take 2 Tablets by mouth three (3) times daily for 360 days.   carvediloL (COREG) 6.25 mg tablet  Self Yes   Sig: Take 1 Tablet by mouth two (2) times a day.   empagliflozin (Jardiance) 10 mg tablet  Self Yes   Sig: Take 1 Tablet by mouth daily.   fluticasone-umeclidinium-vilanterol (Trelegy Ellipta) 100-62.5-25 mcg inhaler  Self Yes   Sig: INHALE 1 PUFF BY MOUTH DAILY.   gabapentin (NEURONTIN) 100 mg capsule  Self Yes   Sig: Take 1 Capsule by mouth two (2) times a day.   omeprazole (PRILOSEC) 40 mg capsule  Self Yes   Sig: Take 1 Capsule by mouth daily.   ranolazine ER (RANEXA) 500 mg SR tablet  Self Yes   Sig: Take 1 Tablet by mouth two (2) times a day.   rosuvastatin (CRESTOR) 20 mg tablet  Self Yes   Sig: Take 1 Tablet by  mouth nightly for 360 days.   vortioxetine (Trintellix) 20 mg tablet  Self Yes   Sig: Take 1 Tablet by mouth daily.      Facility-Administered Medications: None

## 2021-12-17 NOTE — Progress Notes (Signed)
TRANSFER - IN REPORT:    Verbal report received from Lauren, RN (name) on Carlos Petersen  being received from ER (unit) for routine progression of care      Report consisted of patient's Situation, Background, Assessment and   Recommendations(SBAR).     Information from the following report(s) SBAR, ED Summary, Intake/Output, MAR, and Recent Results was reviewed with the receiving nurse.    Opportunity for questions and clarification was provided.      Assessment completed upon patient's arrival to unit and care assumed.

## 2021-12-17 NOTE — Consults (Signed)
Consults  by Donnamae Jude, FNP at 12/17/21 1541                Author: Donnamae Jude, FNP  Service: Cardiology  Author Type: Nurse Practitioner       Filed: 12/17/21 1554  Date of Service: 12/17/21 1541  Status: Attested           Editor: Donnamae Jude, FNP (Nurse Practitioner)  Cosigner: Jerl Mina, MD at 12/17/21 1631            Consult Orders        1. IP CONSULT TO CARDIOLOGY [[id number]] ordered by Margie Ege, PA-C at 12/17/21 1420                         Attestation signed by Jerl Mina, MD at 12/17/21 1631          I have seen and examined the patient. Agree with findings and plan of care per NP Audrionna Lampton. No recent ICD therapies per patient. EF normal. Appears low risk for  MACE. Can proceed with hip surgery.                                   CARDIOLOGY CONSULTATION         REASON FOR CONSULT: Pre-operative cardiac risk assessment      CHIEF COMPLAINT:  Fall      HISTORY OF PRESENT ILLNESS:  Carlos Petersen is a 71 y.o. year-old male with past medical history significant for PE, COPD, HLD, Parkinson's, and DM who was evaluated today due to fall. We have been consulted for cardiac risk assessment for pending Left  hip surgery. On examination, he is lying in bed, no acute distress, on room air. He reports dizziness. He denies chest pain, shortness of breath, palpitations or edema.       Records from hospital admission course thus far reviewed.       Telemetry reviewed.       INPATIENT MEDICATIONS:  Home medications reviewed.     Current Facility-Administered Medications:    ?  albuterol (PROVENTIL HFA, VENTOLIN HFA, PROAIR HFA) inhaler 2 Puff, 2 Puff, Inhalation, Q6H PRN, Bishop, Krysten, PA-C   ?  [START ON 12/18/2021] amiodarone (CORDARONE) tablet 200 mg, 200 mg, Oral, DAILY, Bishop, Krysten, PA-C   ?  carbidopa-levodopa ER (SINEMET CR) 25-100 mg per tablet 4 Tablet, 4 Tablet, Oral, TID, Bishop, Krysten, PA-C   ?  carvediloL (COREG) tablet 6.25 mg, 6.25 mg, Oral, BID, Bishop,  Krysten, PA-C   ?  [START ON 12/18/2021] empagliflozin (JARDIANCE) tablet 10 mg, 10 mg, Oral, DAILY, Bishop, Krysten, PA-C   ?  budesonide-formoteroL (SYMBICORT) 160-4.5 mcg/actuation HFA inhaler 1 Puff, 1 Puff, Inhalation, BID RT **AND** tiotropium bromide (SPIRIVA  RESPIMAT) 2.5 mcg /actuation, 2 Puff, Inhalation, DAILY, Bishop, Krysten, PA-C   ?  gabapentin (NEURONTIN) capsule 100 mg, 100 mg, Oral, BID, Bishop, Krysten, PA-C   ?  [START ON 12/18/2021] pantoprazole (PROTONIX) tablet 40 mg, 40 mg, Oral, DAILY, Bishop, Krysten, PA-C   ?  ranolazine ER (RANEXA) tablet 500 mg, 500 mg, Oral, BID, Bishop, Krysten, PA-C   ?  rosuvastatin (CRESTOR) tablet 20 mg, 20 mg, Oral, QHS, Bishop, Krysten, PA-C   ?  [START ON 12/18/2021] vortioxetine (TRINTELLIX) tablet 20 mg, 20 mg, Oral, DAILY, Bishop, Krysten, PA-C   ?  sodium chloride (NS) flush 5-40  mL, 5-40 mL, IntraVENous, PRN, Bishop, Krysten, PA-C   ?  acetaminophen (TYLENOL) tablet 650 mg, 650 mg, Oral, Q6H PRN **OR** [DISCONTINUED] acetaminophen (TYLENOL) suppository 650 mg, 650 mg,  Rectal, Q6H PRN, Bishop, Krysten, PA-C   ?  polyethylene glycol (MIRALAX) packet 17 g, 17 g, Oral, DAILY PRN, Bishop, Krysten, PA-C   ?  ondansetron (ZOFRAN ODT) tablet 4 mg, 4 mg, Oral, Q8H PRN **OR** ondansetron (ZOFRAN) injection 4 mg, 4 mg, IntraVENous, Q6H PRN, Bishop,  Krysten, PA-C   ?  glucose chewable tablet 16 g, 4 Tablet, Oral, PRN, Bishop, Krysten, PA-C   ?  glucagon (GLUCAGEN) injection 1 mg, 1 mg, IntraMUSCular, PRN, Bishop, Krysten, PA-C   ?  dextrose 10% infusion 0-250 mL, 0-250 mL, IntraVENous, PRN, Bishop, Krysten, PA-C   ?  insulin lispro (HUMALOG) injection, , SubCUTAneous, AC&HS, Bishop, Krysten, PA-C   ?  traMADoL (ULTRAM) tablet 50 mg, 50 mg, Oral, Q6H PRN, Bishop, Krysten, PA-C, 50 mg at 12/17/21 1503   ?  celecoxib (CELEBREX) capsule 200 mg, 200 mg, Oral, BID, Young, Chevin L, PA-C, 200 mg at 12/17/21 1458   ?  morphine injection 1 mg, 1 mg, IntraVENous, Q6H PRN, Young,  Chevin L, PA-C   ?  enoxaparin (LOVENOX) injection 30 mg, 30 mg, SubCUTAneous, BID, Young, Chevin L, PA-C       ALLERGIES:  Allergies reviewed with the patient,No Known Allergies .      REVIEW OF SYSTEMS:  Complete review of systems performed, pertinents noted above, all other systems are negative.      Physical examination :   No apparent distress.   Heart is regular, rate and rhythm. Normal S1, S2, no murmurs are appreciated.   Lungs are clear bilaterally.   Abdomen is soft, nontender, normal bowel sounds.   Extremities have no edema.      Visit Vitals      BP  (!) 110/57 (BP 1 Location: Left upper arm, BP Patient Position: At rest)        Pulse  73     Temp  97.8 F (36.6 C)     Resp  18     Ht  5\' 11"  (1.803 m)     Wt  104.3 kg (230 lb)     SpO2  97%        BMI  32.08 kg/m           Recent labs results and imaging reviewed.       Discussed case with Dr. and our impression and recommendations are as follows:   1.  Pre-operative cardiac risk assessment: Pending Left hip surgery for displaced subcapital/femoral neck fx, secondary to mechanical fall. He  denies active symptoms. Will obtain echocardiogram, if stable, he is at an intermediate risk for surgery. He reports last dose of Eliquis was last night. Continue to hold. Recommend BB during perioperative period.    2.  VT: S/p ICD ([employer])  Continue amiodarone.    3.  PE: Hx of, Eliquis on hold due to pending surgery. Resume when cleared by Ortho.    4.  COPD: Primary following.       Thank you for involving [name] in the care of this patient.  Please do not hesitate to call me or Dr. [place] if additional questions arise.      [name], FNP   12/17/2021

## 2021-12-17 NOTE — Progress Notes (Signed)
Reason for Admission:  Fx Hip                     RUR Score:  N/A                   Plan for utilizing home health:  None @ this time/uses cane/walker/power chair as needed/self care/no stairs.         PCP: First and Last name:  Madrid Roselyn Meier, MD     Name of Practice:    Are you a current patient: Yes/No: Yes   Approximate date of last visit: Seen a few weeks ago.    Can you participate in a virtual visit with your PCP: Yes/call.                     Current Advanced Directive/Advance Care Plan: Full Code      Healthcare Decision Maker:              Primary Decision Maker: JOMAR, DENZ [phone]                  Transition of Care Plan:                    D/C Plan undecided @ this time - awaiting therapy evals . Pt lives with son & son to transport home if dispo is home. Send Rxs to [facility] upon discharge

## 2021-12-17 NOTE — Progress Notes (Signed)
Bedside and Verbal shift change report given to Theresa, RN (oncoming nurse) by Alexys, LPN (offgoing nurse). Report included the following information SBAR, ED Summary, Intake/Output, MAR, and Recent Results.

## 2021-12-17 NOTE — Progress Notes (Signed)
Telemetry called stating that patient has been fluctuating between sinus rhythm, 1st degree and paced rhythm. 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 I called [facility] to notify on call provider. Message stating above information and callback number were left for provider.

## 2021-12-17 NOTE — Progress Notes (Signed)
Progress  Notes by Belva Chimes at 12/17/21 1127                Author: Belva Chimes  Service: Pharmacy  Author Type: Technician       Filed: 12/17/21 1127  Date of Service: 12/17/21 1127  Status: Signed          Editor: Belva Chimes (Technician)               Admission Medication Reconciliation:      Information obtained from:  Patient    RxQuery data available:  YES        Comments/Recommendations: Updated PTA meds/reviewed patient's allergies.      1)  Patient is good historian       2)  Medication changes (since last review):   Added   - N/A      Adjusted   - N/A      Removed   - Removed lidocaine 4% patch   Removed ozempic 0.25 mg inj      3) Patient states he stopped taking ozempic because it was making him vomit      Pharmacy: [facility] pharmacy benefit data reflects medications filled and processed  through the patient's insurance, however    this data does NOT capture whether the medication was picked up or is currently being taken by the patient.        Prior to Admission Medications     Prescriptions  Last Dose  Informant  Taking?      albuterol (PROVENTIL HFA, VENTOLIN HFA, PROAIR HFA) 90 mcg/actuation inhaler    Self  Yes      Sig: Take 2 Puffs by inhalation every six (6) hours as needed for Wheezing.      alendronate (FOSAMAX) 70 mg tablet    Self  Yes      Sig: Take 1 Tablet by mouth every Wednesday.      amiodarone (CORDARONE) 200 mg tablet    Self  Yes      Sig: Take 1 Tablet by mouth daily.      apixaban (Eliquis) 5 mg tablet    Self  Yes      Sig: Take 1 Tablet by mouth two (2) times a day.      carbidopa-levodopa ER (SINEMET CR) 50-200 mg per tablet    Self  Yes      Sig: Take 2 Tablets by mouth three (3) times daily for 360 days.      carvediloL (COREG) 6.25 mg tablet    Self  Yes      Sig: Take 1 Tablet by mouth two (2) times a day.      empagliflozin (Jardiance) 10 mg tablet    Self  Yes      Sig: Take 1 Tablet by mouth daily.       fluticasone-umeclidinium-vilanterol (Trelegy Ellipta) 100-62.5-25 mcg inhaler    Self  Yes      Sig: INHALE 1 PUFF BY MOUTH DAILY.      gabapentin (NEURONTIN) 100 mg capsule    Self  Yes      Sig: Take 1 Capsule by mouth two (2) times a day.      omeprazole (PRILOSEC) 40 mg capsule    Self  Yes      Sig: Take 1 Capsule by mouth daily.      ranolazine ER (RANEXA) 500 mg SR tablet    Self  Yes  Sig: Take 1 Tablet by mouth two (2) times a day.      rosuvastatin (CRESTOR) 20 mg tablet    Self  Yes      Sig: Take 1 Tablet by mouth nightly for 360 days.      vortioxetine (Trintellix) 20 mg tablet    Self  Yes      Sig: Take 1 Tablet by mouth daily.               Facility-Administered Medications: None

## 2021-12-17 NOTE — Consults (Signed)
Consults  by Tommi Rumps, PA-C at 12/17/21 1145                Author: Tommi Rumps, PA-C  Service: Physician Assistant  Author Type: Physician Assistant       Filed: 12/17/21 1152  Date of Service: 12/17/21 1145  Status: Attested           Editor: Eather Colas (Physician Assistant)  Cosigner: Helene Kelp, MD at 12/17/21 1349            Consult Orders        1. IP CONSULT TO ORTHOPEDIC SURGERY [[id number]] ordered by Forde Radon, MD at 12/17/21 1031                         Attestation signed by Helene Kelp, MD at 12/17/21 1349          Patient just ate lunch. Anticipate left hemi tomorrow morning with Dr. Polo Riley. NPO at midnight.                                      ORTHOPEDIC CONSULT          Patient: Carlos Petersen  MRN: [id number]   SSN: [id number]          Date of Birth: 1950-11-29   Age: 71 y.o.   Sex: male           Subjective:         Carlos Petersen is a 71 y.o. male who is being seen in orthopedic consultation for left hip fracture.  The patient states he fell 2 days ago causing injury to his left hip.  The patient states he was getting up to have breakfast in the morning when he  got dizzy falling causing the injury.  He lives at home with his son his son was able to assist him back to a chair and then to his bedroom but his pain has progressively gotten worse.  He denies any injury to his head or cervical spine from his fall.   His son states he does have a history of falls with 2 within the last 6 months.  He has a history of a right hip hemiarthroplasty performed in 2016 or 2017 in [place].  The patient states at that time he developed bilateral pulmonary embolism  and has been on Eliquis since then.  His last dose of Eliquis was last night.  The patient is now complaining of mild pain of his left hip increased with any movement of his left lower extremity.  He denies any numbness or tingling of his left lower extremity.   He states he does have a rolling  walker and wheelchair at home that he uses occasionally.  He does have a history of a pacemaker placement as well as ICD.  He denies any other musculoskeletal complaints at this time.     Past Medical History:        Diagnosis  Date         ?  COPD (chronic obstructive pulmonary disease) (HCC)       ?  Depression       ?  Diabetes (HCC)       ?  Emphysema lung (HCC)       ?  Heart disease       ?  Hypercholesterolemia       ?  Parkinson disease (HCC)           ?  Pulmonary embolism [facility])            Past Surgical History:         Procedure  Laterality  Date          ?  HX COLONOSCOPY         ?  HX HIP REPLACEMENT  Right  01/2016     ?  HX IMPLANTABLE CARDIOVERTER DEFIBRILLATOR         ?  HX PACEMAKER              ?  HX PACEMAKER PLACEMENT             History reviewed. No pertinent family history.     Social History          Tobacco Use         ?  Smoking status:  Every Day              Packs/day:  1.00         Years:  45.00         Pack years:  45.00         Types:  Cigarettes         ?  Smokeless tobacco:  Never       Substance Use Topics         ?  Alcohol use:  Never           Prior to Admission medications             Medication  Sig  Start Date  End Date  Taking?  Authorizing Provider            omeprazole (PRILOSEC) 40 mg capsule  Take 1 Capsule by mouth daily.      Yes  Provider, Historical     albuterol (PROVENTIL HFA, VENTOLIN HFA, PROAIR HFA) 90 mcg/actuation inhaler  Take 2 Puffs by inhalation every six (6) hours as needed for Wheezing.      Yes  Provider, Historical     alendronate (FOSAMAX) 70 mg tablet  Take 1 Tablet by mouth every Wednesday.      Yes  Provider, Historical     rosuvastatin (CRESTOR) 20 mg tablet  Take 1 Tablet by mouth nightly for 360 days.  11/27/21  11/22/22  Yes  Madrid Roselyn Meier, MD     ranolazine ER (RANEXA) 500 mg SR tablet  Take 1 Tablet by mouth two (2) times a day.  11/27/21    Yes  Madrid Kiefer, Harrison Mons, MD     gabapentin (NEURONTIN) 100 mg capsule  Take 1  Capsule by mouth two (2) times a day.  11/25/21    Yes  Madrid [place], Harrison Mons, MD     empagliflozin (Jardiance) 10 mg tablet  Take 1 Tablet by mouth daily.  11/17/21    Yes  Madrid [employer], Harrison Mons, MD     apixaban (Eliquis) 5 mg tablet  Take 1 Tablet by mouth two (2) times a day.  11/17/21    Yes  Madrid Linward Natal, Harrison Mons, MD     carbidopa-levodopa ER (SINEMET CR) 50-200 mg per tablet  Take 2 Tablets by mouth three (3) times daily for 360 days.  11/17/21  11/12/22  Yes  Madrid Linward Natal, Harrison Mons, MD     carvediloL (COREG) 6.25 mg tablet  Take 1 Tablet by mouth two (2) times a day.  11/17/21    Yes  Madrid [place], Harrison Mons, MD     amiodarone (CORDARONE) 200 mg tablet  Take 1 Tablet by mouth daily.  11/17/21    Yes  Madrid Linward Natal, Harrison Mons, MD     fluticasone-umeclidinium-vilanterol (Trelegy Ellipta) 100-62.5-25 mcg inhaler  INHALE 1 PUFF BY MOUTH DAILY.  11/17/21    Yes  Madrid [place], Harrison Mons, MD     vortioxetine (Trintellix) 20 mg tablet  Take 1 Tablet by mouth daily.  11/17/21    Yes  Madrid [place], Harrison Mons, MD     semaglutide (Ozempic) 0.25 mg or 0.5 mg (2 mg/3 mL) pnij  0.5 mg by SubCUTAneous route every seven (7) days for 90 days.   Patient not taking: Reported on 12/17/2021  11/17/21  12/17/21    Lewanda Rife, MD     alendronate (FOSAMAX) 70 mg tablet  Take one tab weekly with 6-8oz of water in am do not lie down for 30 minutes   Patient taking differently: Take 1 Tablet by mouth every Wednesday. Take one tab weekly with 6-8oz of water in am do not lie down for 30 minutes  11/17/21  12/17/21    Madrid Linward Natal, Harrison Mons, MD     omeprazole (PRILOSEC) 40 mg capsule  TAKE 1 CAPSULE BY MOUTH DAILY BEFORE BREAKFAST   Patient taking differently: Take 1 Capsule by mouth daily. TAKE 1 CAPSULE BY MOUTH DAILY BEFORE BREAKFAST  11/17/21  12/17/21    Madrid Roselyn Meier, MD     lidocaine HCL 4 % ptmd  1 Patch by Apply Externally route daily.    Patient not taking: Reported on 12/17/2021  09/09/21  12/17/21    Marcelyn Bruins, MD            albuterol (PROVENTIL HFA, VENTOLIN HFA, PROAIR HFA) 90 mcg/actuation inhaler  INHALE 2 PUFFS EVERY SIX HOURS AS NEEDED   Patient taking differently: Take 2 Puffs by inhalation every six (6) hours as needed. INHALE 2 PUFFS EVERY SIX HOURS AS NEEDED  05/18/21  12/17/21    Madrid Roselyn Meier, MD           No Known Allergies      Review of Systems:   Review of Systems    Constitutional: Negative.     HENT: Negative.      Eyes: Negative.     Respiratory: Negative.      Cardiovascular: Negative.     Gastrointestinal: Negative.     Genitourinary: Negative.     Musculoskeletal:  Positive for joint pain.         Left hip    Skin: Negative.     Neurological:  Positive for dizziness.    Endo/Heme/Allergies: Negative.     Psychiatric/Behavioral: Negative.              Objective:          Current Facility-Administered Medications          Medication  Dose  Route  Frequency           ?  albuterol (PROVENTIL HFA, VENTOLIN HFA, PROAIR HFA) inhaler 2 Puff   2 Puff  Inhalation  Q6H PRN     ?  [START ON 12/18/2021] amiodarone (CORDARONE) tablet 200 mg   200 mg  Oral  DAILY           ?  carbidopa-levodopa ER (SINEMET CR)  50-200 mg per tablet 2 Tablet   2 Tablet  Oral  TID           ?  carvediloL (COREG) tablet 6.25 mg   6.25 mg  Oral  BID     ?  [START ON 12/18/2021] empagliflozin (JARDIANCE) tablet 10 mg   10 mg  Oral  DAILY     ?  budesonide-formoteroL (SYMBICORT) 160-4.5 mcg/actuation HFA inhaler 1 Puff   1 Puff  Inhalation  BID RT          And           ?  tiotropium bromide (SPIRIVA RESPIMAT) 2.5 mcg /actuation   2 Puff  Inhalation  DAILY     ?  gabapentin (NEURONTIN) capsule 100 mg   100 mg  Oral  BID     ?  [START ON 12/18/2021] omeprazole (PRILOSEC) capsule 40 mg   40 mg  Oral  DAILY     ?  ranolazine ER (RANEXA) tablet 500 mg   500 mg  Oral  BID     ?  rosuvastatin (CRESTOR) tablet 20 mg   20 mg  Oral  QHS     ?  [START ON  12/18/2021] vortioxetine (TRINTELLIX) tablet 20 mg   20 mg  Oral  DAILY     ?  sodium chloride (NS) flush 5-40 mL   5-40 mL  IntraVENous  Q8H     ?  sodium chloride (NS) flush 5-40 mL   5-40 mL  IntraVENous  PRN     ?  acetaminophen (TYLENOL) tablet 650 mg   650 mg  Oral  Q6H PRN          Or           ?  acetaminophen (TYLENOL) suppository 650 mg   650 mg  Rectal  Q6H PRN     ?  polyethylene glycol (MIRALAX) packet 17 g   17 g  Oral  DAILY PRN     ?  ondansetron (ZOFRAN ODT) tablet 4 mg   4 mg  Oral  Q8H PRN          Or           ?  ondansetron (ZOFRAN) injection 4 mg   4 mg  IntraVENous  Q6H PRN           ?  enoxaparin (LOVENOX) injection 40 mg   40 mg  SubCUTAneous  DAILY           ?  glucose chewable tablet 16 g   4 Tablet  Oral  PRN     ?  glucagon (GLUCAGEN) injection 1 mg   1 mg  IntraMUSCular  PRN     ?  dextrose 10% infusion 0-250 mL   0-250 mL  IntraVENous  PRN     ?  insulin lispro (HUMALOG) injection     SubCUTAneous  AC&HS     ?  traMADoL (ULTRAM) tablet 50 mg   50 mg  Oral  Q6H PRN           ?  morphine injection 1 mg   1 mg  IntraVENous  Q4H PRN          Current Outpatient Medications        Medication  Sig         ?  omeprazole (PRILOSEC) 40 mg capsule  Take 1 Capsule by mouth daily.     ?  albuterol (PROVENTIL  HFA, VENTOLIN HFA, PROAIR HFA) 90 mcg/actuation inhaler  Take 2 Puffs by inhalation every six (6) hours as needed for Wheezing.     ?  alendronate (FOSAMAX) 70 mg tablet  Take 1 Tablet by mouth every Wednesday.     ?  rosuvastatin (CRESTOR) 20 mg tablet  Take 1 Tablet by mouth nightly for 360 days.     ?  ranolazine ER (RANEXA) 500 mg SR tablet  Take 1 Tablet by mouth two (2) times a day.     ?  gabapentin (NEURONTIN) 100 mg capsule  Take 1 Capsule by mouth two (2) times a day.     ?  empagliflozin (Jardiance) 10 mg tablet  Take 1 Tablet by mouth daily.     ?  apixaban (Eliquis) 5 mg tablet  Take 1 Tablet by mouth two (2) times a day.     ?  carbidopa-levodopa ER (SINEMET CR) 50-200 mg per  tablet  Take 2 Tablets by mouth three (3) times daily for 360 days.     ?  carvediloL (COREG) 6.25 mg tablet  Take 1 Tablet by mouth two (2) times a day.     ?  amiodarone (CORDARONE) 200 mg tablet  Take 1 Tablet by mouth daily.     ?  fluticasone-umeclidinium-vilanterol (Trelegy Ellipta) 100-62.5-25 mcg inhaler  INHALE 1 PUFF BY MOUTH DAILY.         ?  vortioxetine (Trintellix) 20 mg tablet  Take 1 Tablet by mouth daily.           Vitals:             12/17/21 1033  12/17/21 1056  12/17/21 1057  12/17/21 1058           BP:    (!) 115/59         Pulse:             Resp:             Temp:             SpO2:  93%  92%  92%  93%     Weight:                   Height:                    Alert and oriented x3, No apparent distress      Physical Exam:   Left lower extremity: There is foreshortening external rotation seen of his left lower extremity compared to the right.  There is scaling of the skin of his bilateral tibial regions.  Remainder of his  skin was soft dry and intact throughout left lower extremity.  Sensation was intact throughout left lower extremity.  No calf pain to palpation.  DP/PT pulse are faint but palpable laterally.  Cap refill is 2 seconds.  EHL/DF/PF is 4 out of 5.  Negative  Homans.  No tenderness palpation throughout the trochanteric region of his left hip.  Left lower extremity appears neurovascularly intact.      Labs:   CBC:     Recent Labs           12/17/21   1039     WBC  7.0     RBC  3.44*     HGB  8.1*     HCT  27.5*     MCV  79.9*     RDW  17.0*  PLT  257        CHEMISTRIES:     Recent Labs           12/17/21   1039     NA  134*     K  3.8     CL  105     CO2  23     BUN  10     CREA  0.72        CA  7.6*     PT/INR:No results for input(s): INR, INREXT in the last 72 hours.      No lab exists for component: PROTIME   APTT:No results for input(s): APTT in the last 72 hours.   LIVER PROFILE:     Recent Labs           12/17/21   1039     AST  15        ALT  <6*           IMAGING:    X-rays taken of his pelvis and left hip show a displaced subcapital/femoral neck fracture of his left hip.  Right hip hemiarthroplasty appears to be in good position alignment.  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  Mild arthritic changes seen  of his left hip.        Assessment/Plan:           Hospital Problems   Date Reviewed: 05/18/2021                            Codes  Class  Noted  POA              Hip fracture [facility])  ICD-10-CM: H06.237S   ICD-9-CM: 820.8    12/17/2021  Unknown               Left femoral neck fracture      Discussed treatment options consisting of surgical intervention versus nonoperative conservative management with the patient and his son by phone.  They have decided to proceed with surgical intervention.      The procedure, risk and benefits were explained to the patient in detail including but not limited to: Risk of infection, neurovascular injury, gait disturbance, leg length discrepancy, non-/malunion, wound dehiscence, pain, risk of DVT, risk of pneumonia,  possible need of blood transfusion related risk, reaction to anesthesia and possible death.   The patient expressed full understanding and agrees to proceed with surgical intervention.   All questions were answered for the patient and his son by phone.      Patient will need cardiac evaluation prior to surgery.  Given his last dose of Eliquis was last night we will plan for surgical intervention tomorrow with Dr. Polo Riley once patient is medically cleared to proceed.   We will start prophylactic heparin for DVT prophylaxis SCD bilateral lower extremities for mechanical DVT prophylaxis.   Pain management as needed.      This patient was examined in direct consult with Dr. Gerda Diss.      Thank you for the courtesy of this consult.         Signed By:  Tommi Rumps, PA-C           December 17, 2021

## 2021-12-17 NOTE — ED Provider Notes (Signed)
ED Provider Notes by Ander Gaster, MD at 12/17/21 [phone]                Author: Ander Gaster, MD  Service: Emergency Medicine  Author Type: Physician       Filed: 12/17/21 1125  Date of Service: 12/17/21 0954  Status: Addendum          Editor: Ander Gaster, MD (Physician)          Related Notes: Original Note by Ander Gaster, MD (Physician) filed at 12/17/21 [facility]         Date: 12/17/2021   Patient Name: Carlos Petersen        History of Presenting Illness          Chief Complaint       Patient presents with        ?  Fall           History Provided By: Patient and EMS      HPI: Carlos Petersen, 71 y.o. male with a past medical history significant No significant past medical history presents to the ED with chief complaint  of Fall   .    71 year old male ground-level fall with left hip impact.  Now with left hip pain.  Prior surgery on that side.  No head injury no loss of consciousness no back pain.  No pain to the upper extremities or right lower leg.                     There are no other complaints, changes, or physical findings at this time.      PCP: Thea Alken, MD        Current Outpatient Medications          Medication  Sig  Dispense  Refill           ?  omeprazole (PRILOSEC) 40 mg capsule  Take 1 Capsule by mouth daily.         ?  rosuvastatin (CRESTOR) 20 mg tablet  Take 1 Tablet by mouth nightly for 360 days.  90 Tablet  3     ?  ranolazine ER (RANEXA) 500 mg SR tablet  Take 1 Tablet by mouth two (2) times a day.  180 Tablet  3           ?  gabapentin (NEURONTIN) 100 mg capsule  Take 1 Capsule by mouth two (2) times a day.  60 Capsule  2           ?  empagliflozin (Jardiance) 10 mg tablet  Take 1 Tablet by mouth daily.  90 Tablet  0     ?  apixaban (Eliquis) 5 mg tablet  Take 1 Tablet by mouth two (2) times a day.  180 Tablet  3     ?  carbidopa-levodopa ER (SINEMET CR) 50-200 mg  per tablet  Take 2 Tablets by mouth three (3) times daily for 360 days.  540 Tablet  3     ?  carvediloL (COREG) 6.25 mg tablet  Take 1 Tablet by mouth two (2) times a day.  180 Tablet  3     ?  amiodarone (CORDARONE) 200 mg tablet  Take 1 Tablet by mouth daily.  90 Tablet  3     ?  alendronate (FOSAMAX) 70 mg tablet  Take one tab weekly with 6-8oz of water in am do not lie down for 30 minutes (Patient taking differently: Take 1 Tablet by mouth every Wednesday. Take  one tab weekly with 6-8oz of water in am do not lie down for 30 minutes)  12 Tablet  3     ?  fluticasone-umeclidinium-vilanterol (Trelegy Ellipta) 100-62.5-25 mcg inhaler  INHALE 1 PUFF BY MOUTH DAILY.  1 Each  5     ?  vortioxetine (Trintellix) 20 mg tablet  Take 1 Tablet by mouth daily.  90 Tablet  3     ?  albuterol (PROVENTIL HFA, VENTOLIN HFA, PROAIR HFA) 90 mcg/actuation inhaler  INHALE 2 PUFFS EVERY SIX HOURS AS NEEDED (Patient taking differently: Take 2 Puffs by inhalation every six (6) hours as needed. INHALE 2 PUFFS EVERY SIX  HOURS AS NEEDED)  18 g  4     ?  semaglutide (Ozempic) 0.25 mg or 0.5 mg (2 mg/3 mL) pnij  0.5 mg by SubCUTAneous route every seven (7) days for 90 days. (Patient not taking: Reported on 12/17/2021)  3 mL  2           ?  lidocaine HCL 4 % ptmd  1 Patch by Apply Externally route daily. (Patient not taking: Reported on 12/17/2021)  10 Each  0             Past History        Past Medical History:     Past Medical History:        Diagnosis  Date         ?  COPD (chronic obstructive pulmonary disease) ([place])       ?  Depression       ?  Diabetes ([place])       ?  Emphysema lung ([place])       ?  Heart disease       ?  Hypercholesterolemia       ?  Parkinson disease ([place])           ?  Pulmonary embolism [facility])             Past Surgical History:     Past Surgical History:         Procedure  Laterality  Date          ?  HX COLONOSCOPY         ?  HX HIP REPLACEMENT  Right  01/2016     ?  HX IMPLANTABLE CARDIOVERTER DEFIBRILLATOR         ?   HX PACEMAKER              ?  HX PACEMAKER PLACEMENT               Family History:   History reviewed. No pertinent family history.      Social History:     Social History          Tobacco Use         ?  Smoking status:  Every Day              Packs/day:  1.00         Years:  45.00         Pack years:  45.00         Types:  Cigarettes         ?  Smokeless tobacco:  Never       Substance Use Topics         ?  Alcohol use:  Never         ?  Drug use:  Never           Allergies:   No Known Allergies           Review of Systems     Review of Systems    Constitutional: Negative.  Negative for chills, fatigue and fever.    HENT: Negative.  Negative for congestion, ear pain, nosebleeds and sore throat.     Eyes: Negative.  Negative for pain, discharge and visual disturbance.    Respiratory: Negative.  Negative for cough, chest tightness and shortness of breath.     Cardiovascular: Negative.  Negative for chest pain and leg swelling.    Gastrointestinal: Negative.  Negative for abdominal pain, blood in stool, constipation, diarrhea, nausea and vomiting.    Endocrine: Negative.     Genitourinary: Negative.  Negative for difficulty urinating, dysuria and flank pain.    Musculoskeletal:  Positive for joint swelling. Negative for back pain and myalgias.    Skin: Negative.  Negative for rash and wound.    Allergic/Immunologic: Negative.     Neurological: Negative.  Negative for dizziness, syncope, weakness, numbness and headaches.    Hematological: Negative.  Does not bruise/bleed easily.    Psychiatric/Behavioral: Negative.  Negative for agitation, confusion, hallucinations and  suicidal ideas.     All other systems reviewed and are negative.          Positives and Pertinent negatives as per HPI.     Physical Exam     Patient Vitals for the past 24 hrs:            Temp  Pulse  Resp  BP  SpO2            12/17/21 1058  --  --  --  --  93 %            12/17/21 1057  --  --  --  --  92 %     12/17/21 1056  --  --  --  (!) 115/59   92 %     12/17/21 1033  --  --  --  --  93 %     12/17/21 1032  --  --  --  --  93 %     12/17/21 1031  --  --  --  --  93 %     12/17/21 1030  --  --  --  (!) 108/58  93 %     12/17/21 1016  --  --  --  (!) 120/98  93 %     12/17/21 1001  --  --  --  (!) 117/57  94 %     12/17/21 0946  --  --  --  (!) 118/54  94 %            12/17/21 0916  97.9 F (36.6 C)  79  15  (!) 114/57  95 %              Physical Exam   Vitals and nursing note reviewed.    Constitutional:        General: He is not in acute distress.      Appearance: He is normal weight. He  is not ill-appearing.    HENT:       Head: Normocephalic and atraumatic.       Right Ear: External ear normal.       Left Ear: External ear normal.       Nose: Nose normal. No rhinorrhea.       Mouth/Throat:       Mouth: Mucous membranes are moist.       Pharynx: Oropharynx is clear.    Eyes:       Extraocular Movements: Extraocular movements intact.       Conjunctiva/sclera: Conjunctivae normal.       Pupils: Pupils are equal, round, and reactive to light.     Cardiovascular:       Rate and Rhythm: Normal rate and regular rhythm.       Pulses: Normal pulses.       Heart sounds: Normal heart sounds.    Pulmonary:       Effort: Pulmonary effort is normal. No respiratory distress.       Breath sounds: Normal breath sounds.     Abdominal:       General: Abdomen is flat. Bowel sounds are normal.       Palpations: Abdomen is soft.     Musculoskeletal:          General: Tenderness present. No deformity. Normal range of motion.       Cervical back: Normal range of motion and neck supple.    Skin:      General: Skin is warm and dry.       Capillary Refill: Capillary refill takes less than 2 seconds.       Findings: No bruising, lesion or rash.    Neurological:       General: No focal deficit present.       Mental Status: He is alert and oriented to person, place, and time. Mental status is at baseline.    Psychiatric:          Mood and Affect: Mood normal.          Behavior:  Behavior normal.          Thought Content: Thought content normal.          Judgment: Judgment normal.            Diagnostic Study Results        LABS:      Recent Results (from the past 12 hour(s))     URINALYSIS W/ RFLX MICROSCOPIC          Collection Time: 12/17/21  9:31 AM         Result  Value  Ref Range            Color  Yellow/Straw          Appearance  Clear  Clear         Specific gravity  1.012  1.003 - 1.030         pH (UA)  6.0  5.0 - 8.0         Protein  Negative  Negative mg/dL       Glucose  Negative  Negative mg/dL       Ketone  5 (A)  Negative mg/dL       Bilirubin  Negative  Negative         Blood  Negative  Negative         Urobilinogen  4.0 (H)  0.1 - 1.0 EU/dL  Nitrites  Negative  Negative         Leukocyte Esterase  Negative  Negative         CBC WITH AUTOMATED DIFF          Collection Time: 12/17/21 10:39 AM         Result  Value  Ref Range            WBC  7.0  4.1 - 11.1 K/uL       RBC  3.44 (L)  4.10 - 5.70 M/uL       HGB  8.1 (L)  12.1 - 17.0 g/dL       HCT  27.5 (L)  36.6 - 50.3 %       MCV  79.9 (L)  80.0 - 99.0 FL       MCH  23.5 (L)  26.0 - 34.0 PG       MCHC  29.5 (L)  30.0 - 36.5 g/dL       RDW  17.0 (H)  11.5 - 14.5 %       PLATELET  257  150 - 400 K/uL       MPV  8.4 (L)  8.9 - 12.9 FL       NRBC  0.0  0.0 PER 100 WBC       ABSOLUTE NRBC  0.00  0.00 - 0.01 K/uL       NEUTROPHILS  65  32 - 75 %       LYMPHOCYTES  20  12 - 49 %       MONOCYTES  9  5 - 13 %       EOSINOPHILS  4  0 - 7 %       BASOPHILS  1  0 - 1 %       IMMATURE GRANULOCYTES  1 (H)  0 - 0.5 %       ABS. NEUTROPHILS  4.6  1.8 - 8.0 K/UL       ABS. LYMPHOCYTES  1.4  0.8 - 3.5 K/UL       ABS. MONOCYTES  0.6  0.0 - 1.0 K/UL       ABS. EOSINOPHILS  0.3  0.0 - 0.4 K/UL       ABS. BASOPHILS  0.1  0.0 - 0.1 K/UL       ABS. IMM. GRANS.  0.1 (H)  0.00 - 0.04 K/UL       DF  AUTOMATED          METABOLIC PANEL, COMPREHENSIVE          Collection Time: 12/17/21 10:39 AM         Result  Value  Ref Range            Sodium  134 (L)   136 - 145 mmol/L       Potassium  3.8  3.5 - 5.1 mmol/L       Chloride  105  97 - 108 mmol/L       CO2  23  21 - 32 mmol/L       Anion gap  6  5 - 15 mmol/L       Glucose  107 (H)  65 - 100 mg/dL       BUN  10  6 - 20 mg/dL            Creatinine  0.72  0.70 - 1.30 mg/dL  BUN/Creatinine ratio  14  12 - 20         eGFR  >60  >60 ml/min/1.12m       Calcium  7.6 (L)  8.5 - 10.1 mg/dL       Bilirubin, total  0.5  0.2 - 1.0 mg/dL       AST (SGOT)  15  15 - 37 U/L       ALT (SGPT)  <6 (L)  12 - 78 U/L       Alk. phosphatase  82  45 - 117 U/L       Protein, total  7.3  6.4 - 8.2 g/dL       Albumin  2.1 (L)  3.5 - 5.0 g/dL       Globulin  5.2 (H)  2.0 - 4.0 g/dL            A-G Ratio  0.4 (L)  1.1 - 2.2              EKG: EKG interpreted independently by ER physician.       RADIOLOGY:   Non-plain film images such as CT, Ultrasound and MRI are read by the radiologist. Plain radiographic images are visualized and preliminarily interpreted by the ED Provider with the below findings:       Hip X-ray interpreted independently by ER physician.  Left femoral neck fracture noted without displacement.       Interpretation per the Radiologist below, if available at the time of this note:       XR PELV AP ONLY      Result Date: 12/17/2021   EXAM: XR PELV AP ONLY, XR FEMUR LT 2 V INDICATION: Fall. COMPARISON: None. FINDINGS: An AP view  of the pelvis demonstrates acute subcapital femoral neck fracture which is mildly displaced. Patient is status post RIGHT hip total arthroplasty. The SI joints  and pubic symphysis are aligned. The AP/lateral views of the LEFT femur. Again seen is the subcapital fracture of the LEFT femoral neck. No additional acute fracture is seen. There are scattered vascular calcifications. No joint effusion is appreciated  at the knee.       1. Acute, mildly displaced subcapital fracture of the LEFT femoral neck. 2. No additional acute fracture of the LEFT femur is identified.      XR FEMUR LT 2 V       Result Date: 12/17/2021   EXAM: XR PELV AP ONLY, XR FEMUR LT 2 V INDICATION: Fall. COMPARISON: None. FINDINGS: An AP view  of the pelvis demonstrates acute subcapital femoral neck fracture which is mildly displaced. Patient is status post RIGHT hip total arthroplasty. The SI joints  and pubic symphysis are aligned. The AP/lateral views of the LEFT femur. Again seen is the subcapital fracture of the LEFT femoral neck. No additional acute fracture is seen. There are scattered vascular calcifications. No joint effusion is appreciated  at the knee.       1. Acute, mildly displaced subcapital fracture of the LEFT femoral neck. 2. No additional acute fracture of the LEFT femur is identified.       CT Results  (Last 48 hours)             None                    CXR Results  (Last 48 hours)             None  Medical Decision Making and ED Course           CC/HPI Summary, DDx, ED Course, and Reassessment: 71 year old male prior  surgery left hip presents with a ground-level fall with left hip impact.  No head injury no back impact only complaint of left hip pain no numbness or weakness.  Unable to bear weight concern for fracture versus dislocation versus contusion plan for x-ray  and reassessment.  Consider CT if x-ray is unremarkable as he is unable to bear weight.      These orders were placed during the ER evaluation:     Orders Placed This Encounter        ?  XR FEMUR LT 2 V              Standing Status:    Standing         Number of Occurrences:    1         Order Specific Question:    Transport         Answer:    BED [2]         Order Specific Question:    Reason for Exam         Answer:    pain        ?  XR PELV AP ONLY              Standing Status:    Standing         Number of Occurrences:    1         Order Specific Question:    Transport         Answer:    BED [2]         Order Specific Question:    Reason for Exam         Answer:    pain fall        ?  XR CHEST PORT              Standing  Status:    Standing         Number of Occurrences:    1         Order Specific Question:    Reason for Exam         Answer:    preop        ?  URINALYSIS W/ RFLX MICROSCOPIC              Standing Status:    Standing         Number of Occurrences:    1        ?  CBC WITH AUTOMATED DIFF              Standing Status:    Standing         Number of Occurrences:    1        ?  COMPREHENSIVE METABOLIC PANEL              Standing Status:    Standing         Number of Occurrences:    1        ?  EKG, 12 LEAD, INITIAL              Standing Status:    Standing         Number of Occurrences:    1         Order Specific Question:  Reason for Exam:         Answer:    preop        ?  TYPE & SCREEN             ENTER SURGERY DATE IF FOR PRE-OP TESTING.              Standing Status:    Standing         Number of Occurrences:    1         Order Specific Question:    Has patient been transfused or pregnant in the last 3 mos.?         Answer:    Unknown        ?  HYDROcodone-acetaminophen (NORCO) 5-325 mg per tablet 1 Tablet     ?  IP CONSULT TO ORTHOPEDIC SURGERY              Standing Status:    Standing         Number of Occurrences:    1         Order Specific Question:    Reason for Consult:         Answer:    hip fracture         Order Specific Question:    Did you call or speak to the consulting provider?         Answer:    No         Order Specific Question:    Consult To         Answer:    hip pain         Order Specific Question:    Schedule When?              Answer:    TODAY            Patient was given the following medications:     Medications       HYDROcodone-acetaminophen (NORCO) 5-325 mg per tablet 1 Tablet (1 Tablet Oral Given 12/17/21 0932)                 ED Course:            ED Course as of 12/17/21 1124       Thu Dec 17, 2021        0954  URINALYSIS W/ RFLX MICROSCOPIC(!):     Color Yellow/Straw    Appearance Clear    Specific gravity 1.012    pH (UA) 6.0    Protein Negative    Glucose Negative    Ketone 5(!)     Bilirubin Negative    Blood Negative    Urobilinogen 4.0(!)    Nitrites Negative    Leukocyte Esterase Negative   Interpreted independently by ER physician as normal    [HP]     1030  XR hip L-; FINDINGS: An AP view  of the pelvis demonstrates acute subcapital femoral neck   fracture which is mildly displaced. Patient is status post RIGHT hip total   arthroplasty. The SI joints and pubic symphysis are aligned.       The AP/lateral views of the LEFT femur. Again seen is the subcapital fracture of   the LEFT femoral neck. No additional acute fracture is seen. There are scattered   vascular calcifications. No joint effusion is appreciated at the knee.       IMPRESSION       1. Acute, mildly  displaced subcapital fracture of the LEFT femoral neck.       2. No additional acute fracture of the LEFT femur is identified.   -> X-ray independently reviewed by ER physician positive for left hip fracture. [HP]     67  Dr grenier orthopedics consulted for the hip fracture. [HP]     9604  Orthopedics is requesting a medical admission secondary to multiple comorbidities. [HP]     1124  Dr Joylene Grapes will admit [HP]              ED Course User Index   [HP] Kayo Zion, Marsh Dolly, MD              Vital Signs-Reviewed the patient's vital signs.   Patient Vitals for the past 24 hrs:            Temp  Pulse  Resp  BP  SpO2            12/17/21 1058  --  --  --  --  93 %            12/17/21 1057  --  --  --  --  92 %     12/17/21 1056  --  --  --  (!) 115/59  92 %     12/17/21 1033  --  --  --  --  93 %     12/17/21 1032  --  --  --  --  93 %     12/17/21 1031  --  --  --  --  93 %     12/17/21 1030  --  --  --  (!) 108/58  93 %     12/17/21 1016  --  --  --  (!) 120/98  93 %     12/17/21 1001  --  --  --  (!) 117/57  94 %     12/17/21 0946  --  --  --  (!) 118/54  94 %            12/17/21 0916  97.9 F (36.6 C)  79  15  (!) 114/57  95 %        Vitals interpreted independently by ER physician. stable      Medical decision making tools:                      No data recorded              Disposition Considerations (Tests not done, Shared Decision Making, Pt Expectation of Test or Tx.): Femoral neck fracture left hip noted  on x-ray.  Patient remains neurovascularly intact.  Orthopedics Dr.grenier aware, asked for PA to see pt, Mr young paged.  Preop labs sent.  Patient kept NPO.      Admit ortho      CONSULTS: (Who and What was discussed)   IP CONSULT TO ORTHOPEDIC SURGERY      Chronic Conditions: parkinsons      Social Determinants affecting Dx or Tx: None      Records Reviewed (source and summary of external notes): Prior medical records   Records Reviewed: Previous Hospital chart. EMS run report.   I reviewed the vital signs, available nursing notes, past medical history, past surgical history, family history and social history.      Initial assessment performed. The patients presenting problems have been discussed, and they are in agreement with the care plan formulated and outlined  with them.  I have encouraged them to ask questions as they arise throughout their visit.            Admitted           Procedures                         Disposition           Emergency Department Disposition:  Admitted           Diagnosis        Clinical Impression:       1.  Closed fracture of neck of left femur, initial encounter [facility])            Attestations:      Ander Gaster, MD      Please note that this dictation was completed with Dragon, the computer voice recognition software.  Quite often unanticipated grammatical, syntax, homophones, and other interpretive errors are inadvertently  transcribed by the computer software.  Please disregard these errors.  Please excuse any errors that have escaped final proofreading.  Thank you.

## 2021-12-17 NOTE — ED Notes (Signed)
ED Triage Notes by Arlington Calix, RN at 12/17/21 [phone]                Author: Arlington Calix, RN  Service: --  Author Type: Registered Nurse       Filed: 12/17/21 0916  Date of Service: 12/17/21 0914  Status: Signed          Editor: Arlington Calix, RN (Registered Nurse)               Pt arrived via ems from home with ground level fall yesterday, did not hit his head, left leg gave out, pain in left leg, not able to bear weight or walk on it since fall.      Has previous right sided hip replacement, hx of COPD.       Can stand and pivot with right leg and assistance.

## 2021-12-17 NOTE — ED Notes (Signed)
TRANSFER - OUT REPORT:    Verbal report given to Alexis RN(name) on Carlos Petersen  being transferred to [facility] (unit) for routine progression of care       Report consisted of patient's Situation, Background, Assessment and   Recommendations(SBAR).     Information from the following report(s) SBAR, ED Summary and MAR was reviewed with the receiving nurse.    Lines:   Peripheral IV 12/17/21 Left Antecubital (Active)        Opportunity for questions and clarification was provided.      Patient transported with:   Monitor  Tech

## 2021-12-17 NOTE — ACP (Advance Care Planning) (Signed)
ACP (Advance Care Planning) by Loma Sousa, RN at 12/17/21 1151                Author: Loma Sousa, RN  Service: CASE MANAGEMENT  Author Type: Care Management       Filed: 12/17/21 1151  Date of Service: 12/17/21 1151  Status: Signed          Editor: Loma Sousa, RN (Care Management)               Advance Care Planning   Healthcare Decision Maker:         Primary Decision Maker: Carlos Petersen, Carlos Petersen [phone]

## 2021-12-17 NOTE — H&P (Signed)
History and Physical    Patient: Carlos Petersen MRN: [id number]  SSN: [id number]    Date of Birth: September 03, 1950  Age: 71 y.o.  Sex: male      Subjective:      Carlos Petersen is a 71 y.o. male past medical history of COPD, diabetes, heart disease, hypertension, hyperlipidemia, Parkinson's disease, pulmonary embolism on anticoagulation that presented to the ED on 12/17/2021 after sustaining ground-level fall 2 days prior.  Patient denies any chest pain, shortness of breath, dizziness preceding the event.  He tripped over his feet.  Fell on his left side.  He denied any head injury or loss of consciousness.  He does state that his pain was great and was unable to bear weight.  As it was not improving he presented to the ED.  Patient says bearing weight or movement makes it worse.  Rest makes it feel better.  He was given IV pain medication which she said is helping.  In the ED x-ray of the femur and pelvis showed a acute mildly displaced subcapital fracture of the left femoral neck.  Laboratory data showed a hemoglobin 8.1.  Vital signs stable.  Requested admission for ORIF of the left hip    Past Medical History:   Diagnosis Date    COPD (chronic obstructive pulmonary disease) ([place])     Depression     Diabetes ([place])     Emphysema lung ([place])     Heart disease     Hypercholesterolemia     Parkinson disease ([place])     Pulmonary embolism ([place])      Past Surgical History:   Procedure Laterality Date    HX COLONOSCOPY      HX HIP REPLACEMENT Right 01/2016    HX IMPLANTABLE CARDIOVERTER DEFIBRILLATOR      HX PACEMAKER      HX PACEMAKER PLACEMENT        History reviewed. No pertinent family history. - HTN  Social History     Tobacco Use    Smoking status: Every Day     Packs/day: 1.00     Years: 45.00     Pack years: 45.00     Types: Cigarettes    Smokeless tobacco: Never   Substance Use Topics    Alcohol use: Never      Prior to Admission medications    Medication Sig Start Date End Date Taking? Authorizing Provider    omeprazole (PRILOSEC) 40 mg capsule Take 1 Capsule by mouth daily.   Yes Provider, Historical   albuterol (PROVENTIL HFA, VENTOLIN HFA, PROAIR HFA) 90 mcg/actuation inhaler Take 2 Puffs by inhalation every six (6) hours as needed for Wheezing.   Yes Provider, Historical   alendronate (FOSAMAX) 70 mg tablet Take 1 Tablet by mouth every Wednesday.   Yes Provider, Historical   rosuvastatin (CRESTOR) 20 mg tablet Take 1 Tablet by mouth nightly for 360 days. 11/27/21 11/22/22 Yes [place] Debria Garret, MD   ranolazine ER (RANEXA) 500 mg SR tablet Take 1 Tablet by mouth two (2) times a day. 11/27/21  Yes [place] Thunder[place], Luanna Salk, MD   gabapentin (NEURONTIN) 100 mg capsule Take 1 Capsule by mouth two (2) times a day. 11/25/21  Yes [place], Luanna Salk, MD   empagliflozin (Jardiance) 10 mg tablet Take 1 Tablet by mouth daily. 11/17/21  Yes [place], Luanna Salk, MD   apixaban (Eliquis) 5 mg tablet Take 1 Tablet by mouth two (2) times a day. 11/17/21  Yes  [place] Cheri Rous, Luanna Salk, MD   carbidopa-levodopa ER (SINEMET CR) 50-200 mg per tablet Take 2 Tablets by mouth three (3) times daily for 360 days. 11/17/21 11/12/22 Yes [place] Debria Garret, MD   carvediloL (COREG) 6.25 mg tablet Take 1 Tablet by mouth two (2) times a day. 11/17/21  Yes [place], Luanna Salk, MD   amiodarone (CORDARONE) 200 mg tablet Take 1 Tablet by mouth daily. 11/17/21  Yes [place] Cheri Rous, Luanna Salk, MD   fluticasone-umeclidinium-vilanterol (Trelegy Ellipta) 100-62.5-25 mcg inhaler INHALE 1 PUFF BY MOUTH DAILY. 11/17/21  Yes [place], Luanna Salk, MD   vortioxetine (Trintellix) 20 mg tablet Take 1 Tablet by mouth daily. 11/17/21  Yes [place] Debria Garret, MD        No Known Allergies    Review of Systems:  Constitutional: No fevers, No chills, No fatigue, ++ weakness  Eyes: No visual disturbance  Ears, Nose, Mouth, Throat, and Face: No nasal congestion, No sore throat  Respiratory:  No cough, No sputum, No wheezing, No SOB  Cardiovascular: No chest pain, No lower extremity edema, No Palpitations   Gastrointestinal: No nausea, No vomiting, No diarrhea, No constipation, No abdominal pain  Genitourinary: No frequency, No dysuria, No hematuria  Integument/Breast: No rash, No skin lesion(s), No dryness  Musculoskeletal: No arthralgias, No neck pain, No back pain  Neurological: No headaches, No dizziness, No confusion,  No seizures  Behavioral/Psychiatric: No anxiety, No depression      Objective:     Vitals:    12/17/21 1033 12/17/21 1056 12/17/21 1057 12/17/21 1058   BP:  (!) 115/59     Pulse:       Resp:       Temp:       SpO2: 93% 92% 92% 93%   Weight:       Height:            Physical Exam:  General: alert, cooperative, no distress  Eye: conjunctivae/corneas clear. PERRL, EOM's intact.   Throat and Neck: normal and no erythema or exudates noted. No mass   Lung: clear to auscultation bilaterally  Heart: regular rate and rhythm,   Abdomen: soft, non-tender. Bowel sounds normal. No masses,  Extremities:  able to move UE BL and RLE  Skin: Normal.  Neurologic: AOx3. Cranial nerves 2-12 and sensation grossly intact.  Psychiatric: non focal    Recent Results (from the past 24 hour(s))   URINALYSIS W/ RFLX MICROSCOPIC    Collection Time: 12/17/21  9:31 AM   Result Value Ref Range    Color Yellow/Straw      Appearance Clear Clear      Specific gravity 1.012 1.003 - 1.030      pH (UA) 6.0 5.0 - 8.0      Protein Negative Negative mg/dL    Glucose Negative Negative mg/dL    Ketone 5 (A) Negative mg/dL    Bilirubin Negative Negative      Blood Negative Negative      Urobilinogen 4.0 (H) 0.1 - 1.0 EU/dL    Nitrites Negative Negative      Leukocyte Esterase Negative Negative     CBC WITH AUTOMATED DIFF    Collection Time: 12/17/21 10:39 AM   Result Value Ref Range    WBC 7.0 4.1 - 11.1 K/uL    RBC 3.44 (L) 4.10 - 5.70 M/uL    HGB 8.1 (L) 12.1 - 17.0 g/dL    HCT 27.5 (L) 36.6 - 50.3 %  MCV 79.9 (L) 80.0 -  99.0 FL    MCH 23.5 (L) 26.0 - 34.0 PG    MCHC 29.5 (L) 30.0 - 36.5 g/dL    RDW 17.0 (H) 11.5 - 14.5 %    PLATELET 257 150 - 400 K/uL    MPV 8.4 (L) 8.9 - 12.9 FL    NRBC 0.0 0.0 PER 100 WBC    ABSOLUTE NRBC 0.00 0.00 - 0.01 K/uL    NEUTROPHILS 65 32 - 75 %    LYMPHOCYTES 20 12 - 49 %    MONOCYTES 9 5 - 13 %    EOSINOPHILS 4 0 - 7 %    BASOPHILS 1 0 - 1 %    IMMATURE GRANULOCYTES 1 (H) 0 - 0.5 %    ABS. NEUTROPHILS 4.6 1.8 - 8.0 K/UL    ABS. LYMPHOCYTES 1.4 0.8 - 3.5 K/UL    ABS. MONOCYTES 0.6 0.0 - 1.0 K/UL    ABS. EOSINOPHILS 0.3 0.0 - 0.4 K/UL    ABS. BASOPHILS 0.1 0.0 - 0.1 K/UL    ABS. IMM. GRANS. 0.1 (H) 0.00 - 0.04 K/UL    DF AUTOMATED     TYPE & SCREEN    Collection Time: 12/17/21 10:39 AM   Result Value Ref Range    Crossmatch Expiration 12/20/2021,2359     ABO/Rh(D) A Negative     Antibody screen Negative    METABOLIC PANEL, COMPREHENSIVE    Collection Time: 12/17/21 10:39 AM   Result Value Ref Range    Sodium 134 (L) 136 - 145 mmol/L    Potassium 3.8 3.5 - 5.1 mmol/L    Chloride 105 97 - 108 mmol/L    CO2 23 21 - 32 mmol/L    Anion gap 6 5 - 15 mmol/L    Glucose 107 (H) 65 - 100 mg/dL    BUN 10 6 - 20 mg/dL    Creatinine 0.72 0.70 - 1.30 mg/dL    BUN/Creatinine ratio 14 12 - 20      eGFR >60 >60 ml/min/1.35m    Calcium 7.6 (L) 8.5 - 10.1 mg/dL    Bilirubin, total 0.5 0.2 - 1.0 mg/dL    AST (SGOT) 15 15 - 37 U/L    ALT (SGPT) <6 (L) 12 - 78 U/L    Alk. phosphatase 82 45 - 117 U/L    Protein, total 7.3 6.4 - 8.2 g/dL    Albumin 2.1 (L) 3.5 - 5.0 g/dL    Globulin 5.2 (H) 2.0 - 4.0 g/dL    A-G Ratio 0.4 (L) 1.1 - 2.2         XR Results (maximum last 3):  [place]    XR PELV AP ONLY    Narrative  EXAM: XR PELV AP ONLY, XR FEMUR LT 2 V    INDICATION: Fall.    COMPARISON: None.    FINDINGS: An AP view  of the pelvis demonstrates acute subcapital femoral neck  fracture which is mildly displaced. Patient is status post RIGHT hip total  arthroplasty. The SI joints and  pubic symphysis are aligned.    The AP/lateral views of the LEFT femur. Again seen is the subcapital fracture of  the LEFT femoral neck. No additional acute fracture is seen. There are scattered  vascular calcifications. No joint effusion is appreciated at the knee.    Impression  1. Acute, mildly displaced subcapital fracture of the LEFT femoral neck.    2. No  additional acute fracture of the LEFT femur is identified.      XR FEMUR LT 2 V    Narrative  EXAM: XR PELV AP ONLY, XR FEMUR LT 2 V    INDICATION: Fall.    COMPARISON: None.    FINDINGS: An AP view  of the pelvis demonstrates acute subcapital femoral neck  fracture which is mildly displaced. Patient is status post RIGHT hip total  arthroplasty. The SI joints and pubic symphysis are aligned.    The AP/lateral views of the LEFT femur. Again seen is the subcapital fracture of  the LEFT femoral neck. No additional acute fracture is seen. There are scattered  vascular calcifications. No joint effusion is appreciated at the knee.    Impression  1. Acute, mildly displaced subcapital fracture of the LEFT femoral neck.    2. No additional acute fracture of the LEFT femur is identified.      Results from [place] encounter on 11/19/21    XR CHEST PORT    Narrative  INDICATION: Fatigue.    Portable AP view of the chest.    Direct comparison made to prior chest x-ray dated February 2020.    Cardiomediastinal silhouette is stable. Intact pacer leads overlie the right  atrium and right ventricle. There is a nodular density overlying the right  midlung, possibly calcified. Lungs are otherwise clear bilaterally. Pleural  spaces are normal and there is no pneumothorax. Osseous structures are intact.    Impression  Nodular density overlying the right midlung, possibly calcified.  Recommend dedicated CT of the chest for further evaluation.      CT Results (maximum last 3):  Results from [place] encounter on 11/19/21    CT CHEST WO  CONT    Narrative  INDICATION: Abnormal chest x-ray    COMPARISON: Chest radiograph dated 11/19/2021    CONTRAST: None.    TECHNIQUE:  5 mm axial images were obtained through the chest. Coronal and  sagittal reformats were generated.  CT dose reduction was achieved through use  of a standardized protocol tailored for this examination and automatic exposure  control for dose modulation.    The absence of intravenous contrast reduces the sensitivity for evaluation of  the mediastinum, hila, vasculature, and upper abdominal organs.    FINDINGS:    CHEST WALL: No mass or axillary lymphadenopathy.  THYROID: No nodule.  MEDIASTINUM: No mass or lymphadenopathy.  HILA: No mass or lymphadenopathy.  THORACIC AORTA: No aneurysm.  MAIN PULMONARY ARTERY: Normal in caliber.  TRACHEA/BRONCHI: Patent.  ESOPHAGUS: No wall thickening or dilatation.  HEART: Normal in size. Coronary artery calcium: present  PLEURA: No effusion or pneumothorax.  LUNGS: 9 mm and adjacent 22 mm semisolid nodule right lower lobe series 204  image 46. 7 mm left upper lobe nodule series 204 image 24. Small 2 mm and 3 mm  nodules on images 32 and 33 in the right upper lobe.  INCIDENTALLY IMAGED UPPER ABDOMEN: Cirrhotic morphology of the liver.  BONES: Ununited right posterior eighth rib fracture.    Impression  1.  Abnormality noted on the previous radiograph corresponds to an ununited  right posterior eighth rib fracture    2.  However several nodular and semisolid opacities are noted. Follow-up chest  CT is recommended in 6 months.    3.  Cirrhotic morphology of the liver.      CT HEAD WO CONT    Narrative  INDICATION: AMS    Exam: Noncontrast CT of  the brain is performed with 5 mm collimation.    CT dose reduction was achieved with the use of the standardized protocol  tailored for this examination and automatic exposure control for dose  modulation.    FINDINGS: There is mild diffuse cortical atrophy with ex vacuo dilatation of the  ventricular system.  There is no acute intracranial hemorrhage, mass, mass effect  or herniation. There is no evidence of acute territorial infarct. The mastoid  air cells are well pneumatized. There is moderate mucosal thickening within the  left sphenoid sinus. The remaining visualized paranasal sinuses are clear.    Impression  No acute intracranial hemorrhage, mass or infarct.      MRI Results (maximum last 3):  No results found for this or any previous visit.      Nuclear Medicine Results (maximum last 3):  No results found for this or any previous visit.      [place] Results (maximum last 3):  No results found for this or any previous visit.      Assessment:   1.  Acute mildly displaced subcapital fracture of the left femoral neck  2.  Heart disease/hypertension  3.  Hyperlipidemia  4.  Type 2 diabetes  5.  COPD  6.  Parkinson's disease  7.  History of PE  8.  Chronic anemia    Plan:   1.  X-rays of the left hip and pelvis shows left femoral neck fracture.  Orthopedics consulted.  We will get postop PT and OT.  Plan for surgery in the a.m.  Tylenol, tramadol, IV morphine as needed for pain.  2.  We will get an EKG.  2D echo.  Cardiac telemetry.  Consult cardiology for preop clearance.  Chest x-ray pending.  Patient on amiodarone 2 oh milligrams daily, Coreg 6.25 mg twice daily, Ranexa 500 mg twice daily.  Continue to monitor blood pressure per unit protocol  3.  On Crestor 20 mg daily at bedtime  4.  Continue with Jardiance 10 mg daily.  Insulin sliding scale.  5.  Oxygen saturation within normal limits on room air.  Chest x-ray pending.  Symbicort/Spiriva.  Albuterol as needed  6.  Continue with Sinemet 3 times a day  7.  We will hold patient's Eliquis.  Lovenox for DVT prophylaxis.  Patient is not hypoxic.  Will resume postop day 1  8.  Hemoglobin is low but stable.  Continue to monitor.  No signs of active bleeding.  Patient becomes hemodynamically unstable or hemoglobin drops was for 7 transfuse  9.  CBC BMP in the a.m.    Full  code    DVT prophylaxis Lovenox hold on day of surgery  GI prophylaxis Prilosec    Total time: 61 minutes      Given the patient's current clinical presentation, I have a high level of concern for decompensation if discharged from the emergency department. Patient will be admitted as an Inpatient and estimated LOS 2 days    Signed By: Ardean Larsen, PA-C     December 17, 2021

## 2021-12-17 NOTE — Progress Notes (Signed)
Patient states that he is a DNR, son will bring advanced directive paperwork tomorrow 12/18/2021. I called Ardean Larsen PA to notify her of DNR status, no answer message sent by operator via perfect serve. Oncoming night nurse [place], RN aware.

## 2021-12-18 ENCOUNTER — Inpatient Hospital Stay: Admit: 2021-12-19 | Payer: MEDICARE | Primary: Internal Medicine

## 2021-12-18 LAB — CBC WITH AUTOMATED DIFF
ABS. BASOPHILS: 0 10*3/uL (ref 0.0–0.1)
ABS. EOSINOPHILS: 0.2 10*3/uL (ref 0.0–0.4)
ABS. IMM. GRANS.: 0.1 10*3/uL — ABNORMAL HIGH (ref 0.00–0.04)
ABS. LYMPHOCYTES: 1.3 10*3/uL (ref 0.8–3.5)
ABS. MONOCYTES: 0.7 10*3/uL (ref 0.0–1.0)
ABS. NEUTROPHILS: 3.6 10*3/uL (ref 1.8–8.0)
ABSOLUTE NRBC: 0 10*3/uL (ref 0.00–0.01)
BASOPHILS: 1 % (ref 0–1)
EOSINOPHILS: 4 % (ref 0–7)
HCT: 28.3 % — ABNORMAL LOW (ref 36.6–50.3)
HGB: 8.4 g/dL — ABNORMAL LOW (ref 12.1–17.0)
IMMATURE GRANULOCYTES: 2 % — ABNORMAL HIGH (ref 0–0.5)
LYMPHOCYTES: 22 % (ref 12–49)
MCH: 23.9 PG — ABNORMAL LOW (ref 26.0–34.0)
MCHC: 29.7 g/dL — ABNORMAL LOW (ref 30.0–36.5)
MCV: 80.4 FL (ref 80.0–99.0)
MPV: 8.7 FL — ABNORMAL LOW (ref 8.9–12.9)
NEUTROPHILS: 60 % (ref 32–75)
NRBC: 0 PER 100 WBC
PLATELET: 252 10*3/uL (ref 150–400)
RBC: 3.52 M/uL — ABNORMAL LOW (ref 4.10–5.70)
RDW: 17 % — ABNORMAL HIGH (ref 11.5–14.5)
WBC: 5.9 10*3/uL (ref 4.1–11.1)

## 2021-12-18 LAB — METABOLIC PANEL, BASIC
Anion gap: 5 mmol/L (ref 5–15)
BUN/Creatinine ratio: 18 (ref 12–20)
BUN: 12 mg/dL (ref 6–20)
CO2: 24 mmol/L (ref 21–32)
Calcium: 7.9 mg/dL — ABNORMAL LOW (ref 8.5–10.1)
Chloride: 107 mmol/L (ref 97–108)
Creatinine: 0.65 mg/dL — ABNORMAL LOW (ref 0.70–1.30)
Glucose: 99 mg/dL (ref 65–100)
Potassium: 3.9 mmol/L (ref 3.5–5.1)
Sodium: 136 mmol/L (ref 136–145)
eGFR: >60 mL/min/{1.73_m2} (ref >60)

## 2021-12-18 LAB — GLUCOSE, POC
Glucose (POC): 106 mg/dL — ABNORMAL HIGH (ref 65–100)
Glucose (POC): 109 mg/dL — ABNORMAL HIGH (ref 65–100)
Glucose (POC): 103 mg/dL — ABNORMAL HIGH (ref 65–100)

## 2021-12-18 LAB — CBC WITH AUTO DIFFERENTIAL
Basophils %: 1 % (ref 0–1)
Basophils Absolute: 0 10*3/uL (ref 0.0–0.1)
Eosinophils %: 4 % (ref 0–7)
Eosinophils Absolute: 0.2 10*3/uL (ref 0.0–0.4)
Granulocyte Absolute Count: 0.1 10*3/uL — ABNORMAL HIGH (ref 0.00–0.04)
Hematocrit: 28.3 % — ABNORMAL LOW (ref 36.6–50.3)
Hemoglobin: 8.4 g/dL — ABNORMAL LOW (ref 12.1–17.0)
Immature Granulocytes: 2 % — ABNORMAL HIGH (ref 0–0.5)
Lymphocytes %: 22 % (ref 12–49)
Lymphocytes Absolute: 1.3 10*3/uL (ref 0.8–3.5)
MCH: 23.9 PG — ABNORMAL LOW (ref 26.0–34.0)
MCHC: 29.7 g/dL — ABNORMAL LOW (ref 30.0–36.5)
MCV: 80.4 FL (ref 80.0–99.0)
MPV: 8.7 FL — ABNORMAL LOW (ref 8.9–12.9)
Monocytes %: 11 % (ref 5–13)
Monocytes Absolute: 0.7 10*3/uL (ref 0.0–1.0)
NRBC Absolute: 0 10*3/uL (ref 0.00–0.01)
Neutrophils %: 60 % (ref 32–75)
Neutrophils Absolute: 3.6 10*3/uL (ref 1.8–8.0)
Nucleated RBCs: 0 PER 100 WBC
Platelets: 252 10*3/uL (ref 150–400)
RBC: 3.52 M/uL — ABNORMAL LOW (ref 4.10–5.70)
RDW: 17 % — ABNORMAL HIGH (ref 11.5–14.5)
WBC: 5.9 10*3/uL (ref 4.1–11.1)

## 2021-12-18 LAB — BASIC METABOLIC PANEL
Anion Gap: 5 mmol/L (ref 5–15)
BUN: 12 mg/dL (ref 6–20)
Bun/Cre Ratio: 18 (ref 12–20)
CO2: 24 mmol/L (ref 21–32)
Calcium: 7.9 mg/dL — ABNORMAL LOW (ref 8.5–10.1)
Chloride: 107 mmol/L (ref 97–108)
Creatinine: 0.65 mg/dL — ABNORMAL LOW (ref 0.70–1.30)
ESTIMATED GLOMERULAR FILTRATION RATE: >60 mL/min/{1.73_m2} (ref >60)
Glucose: 99 mg/dL (ref 65–100)
Potassium: 3.9 mmol/L (ref 3.5–5.1)
Sodium: 136 mmol/L (ref 136–145)

## 2021-12-18 LAB — POCT GLUCOSE
POC Glucose: 106 mg/dL — ABNORMAL HIGH (ref 65–100)
POC Glucose: 109 mg/dL — ABNORMAL HIGH (ref 65–100)
POC Glucose: 103 mg/dL — ABNORMAL HIGH (ref 65–100)

## 2021-12-18 MED ORDER — POVIDONE-IODINE 10 % OINTMENT
10 % | CUTANEOUS | Status: AC
Start: 2021-12-18 — End: ?

## 2021-12-18 MED ORDER — PHENYLEPHRINE 10 MG/ML INJECTION
10 mg/mL | INTRAMUSCULAR | Status: DC | PRN
Start: 2021-12-18 — End: 2021-12-18
  Administered 2021-12-18 – 2021-12-19 (×22): via INTRAVENOUS

## 2021-12-18 MED ORDER — PROPOFOL 10 MG/ML IV EMUL
10 mg/mL | INTRAVENOUS | Status: DC | PRN
Start: 2021-12-18 — End: 2021-12-18
  Administered 2021-12-18 – 2021-12-19 (×25): via INTRAVENOUS

## 2021-12-18 MED ORDER — VANCOMYCIN 1,000 MG IV SOLR
1000 mg | INTRAVENOUS | Status: AC
Start: 2021-12-18 — End: ?

## 2021-12-18 MED ORDER — PROPOFOL 10 MG/ML IV EMUL
10 mg/mL | INTRAVENOUS | Status: AC
Start: 2021-12-18 — End: ?

## 2021-12-18 MED ORDER — SODIUM CHLORIDE 0.9 % IV
INTRAVENOUS | Status: AC
Start: 2021-12-18 — End: 2021-12-23
  Administered 2021-12-18 – 2021-12-20 (×2): via INTRAVENOUS

## 2021-12-18 MED ORDER — SODIUM CHLORIDE 0.9 % IV
INTRAVENOUS | Status: AC | PRN
Start: 2021-12-18 — End: 2021-12-24

## 2021-12-18 MED ORDER — CEFAZOLIN 1 GRAM SOLUTION FOR INJECTION
1 gram | INTRAMUSCULAR | Status: DC | PRN
Start: 2021-12-18 — End: 2021-12-18
  Administered 2021-12-18: 23:00:00 via INTRAVENOUS

## 2021-12-18 MED ORDER — EPHEDRINE SULFATE 50 MG/ML INTRAVENOUS SOLUTION
50 mg/mL | INTRAVENOUS | Status: AC
Start: 2021-12-18 — End: ?

## 2021-12-18 MED ORDER — OXYCODONE 5 MG TAB
5 mg | ORAL | Status: AC | PRN
Start: 2021-12-18 — End: 2021-12-24
  Administered 2021-12-20 – 2021-12-24 (×4): via ORAL

## 2021-12-18 MED ORDER — FENTANYL CITRATE (PF) 50 MCG/ML IJ SOLN
50 mcg/mL | INTRAMUSCULAR | Status: AC
Start: 2021-12-18 — End: ?

## 2021-12-18 MED ORDER — FENTANYL CITRATE (PF) 50 MCG/ML IJ SOLN
50 mcg/mL | INTRAMUSCULAR | Status: DC | PRN
Start: 2021-12-18 — End: 2021-12-18
  Administered 2021-12-18: 22:00:00 via INTRAVENOUS

## 2021-12-18 MED ORDER — BUPIVACAINE 0.5 % (5 MG/ML) IJ SOLN
0.5 % (5 mg/mL) | INTRAMUSCULAR | Status: AC
Start: 2021-12-18 — End: ?

## 2021-12-18 MED ORDER — LACTATED RINGERS IV
INTRAVENOUS | Status: DC | PRN
Start: 2021-12-18 — End: 2021-12-18
  Administered 2021-12-18 (×2): via INTRAVENOUS

## 2021-12-18 MED ORDER — PHENYLEPHRINE 1 MG/10 ML (100 MCG/ML) IN NS IV SYRINGE
1 mg/0 mL (00 mcg/mL) | INTRAVENOUS | Status: AC
Start: 2021-12-18 — End: ?

## 2021-12-18 MED ORDER — TRANEXAMIC ACID 1,000 MG/10 ML (100 MG/ML) IV
1000 mg/10 mL (100 mg/mL) | INTRAVENOUS | Status: AC
Start: 2021-12-18 — End: ?

## 2021-12-18 MED FILL — FENTANYL CITRATE (PF) 50 MCG/ML IJ SOLN: 50 mcg/mL | INTRAMUSCULAR | Qty: 1

## 2021-12-18 MED FILL — FIRST AID ANTISEPTIC (POVIDONE-IODINE) 10 % TOPICAL OINTMENT: 10 % | CUTANEOUS | Qty: 28.4

## 2021-12-18 MED FILL — CARBIDOPA-LEVODOPA ER 25-100 MG TABLET, EXTENDED RELEASE: 25-100 mg | ORAL | Qty: 4

## 2021-12-18 MED FILL — TRANEXAMIC ACID 1,000 MG/10 ML (100 MG/ML) IV: 1000 mg/10 mL (100 mg/mL) | INTRAVENOUS | Qty: 20

## 2021-12-18 MED FILL — TRAMADOL 50 MG TAB: 50 mg | ORAL | Qty: 1

## 2021-12-18 MED FILL — CARVEDILOL 3.125 MG TAB: 3.125 mg | ORAL | Qty: 2

## 2021-12-18 MED FILL — SYMBICORT 160 MCG-4.5 MCG/ACTUATION HFA AEROSOL INHALER: RESPIRATORY_TRACT | Qty: 6

## 2021-12-18 MED FILL — AKOVAZ 50 MG/ML INTRAVENOUS SOLUTION: 50 mg/mL | INTRAVENOUS | Qty: 1

## 2021-12-18 MED FILL — AMIODARONE 200 MG TAB: 200 mg | ORAL | Qty: 1

## 2021-12-18 MED FILL — SODIUM CHLORIDE 0.9 % IV: INTRAVENOUS | Qty: 1000

## 2021-12-18 MED FILL — PANTOPRAZOLE 40 MG TAB, DELAYED RELEASE: 40 mg | ORAL | Qty: 1

## 2021-12-18 MED FILL — CELECOXIB 200 MG CAP: 200 mg | ORAL | Qty: 1

## 2021-12-18 MED FILL — BUPIVACAINE 0.5 % (5 MG/ML) IJ SOLN: 0.5 % (5 mg/mL) | INTRAMUSCULAR | Qty: 50

## 2021-12-18 MED FILL — PHENYLEPHRINE 1 MG/10 ML (100 MCG/ML) IN NS IV SYRINGE: 1 mg/0 mL (00 mcg/mL) | INTRAVENOUS | Qty: 10

## 2021-12-18 MED FILL — RANOLAZINE SR 500 MG 12 HR TAB: 500 mg | ORAL | Qty: 1

## 2021-12-18 MED FILL — JARDIANCE 10 MG TABLET: 10 mg | ORAL | Qty: 1

## 2021-12-18 MED FILL — GABAPENTIN 100 MG CAP: 100 mg | ORAL | Qty: 1

## 2021-12-18 MED FILL — PROPOFOL 10 MG/ML IV EMUL: 10 mg/mL | INTRAVENOUS | Qty: 20

## 2021-12-18 MED FILL — SODIUM CHLORIDE 0.9 % IV: INTRAVENOUS | Qty: 250

## 2021-12-18 MED FILL — SPIRIVA RESPIMAT 2.5 MCG/ACTUATION SOLUTION FOR INHALATION: 2.5 mcg/actuation | RESPIRATORY_TRACT | Qty: 4

## 2021-12-18 MED FILL — VANCOMYCIN 1,000 MG IV SOLR: 1000 mg | INTRAVENOUS | Qty: 3000

## 2021-12-18 MED FILL — ROSUVASTATIN 20 MG TAB: 20 mg | ORAL | Qty: 1

## 2021-12-18 MED FILL — MORPHINE 2 MG/ML INJECTION: 2 mg/mL | INTRAMUSCULAR | Qty: 1

## 2021-12-18 NOTE — Progress Notes (Signed)
Progress  Notes by Cyndia Diver, RN at 12/18/21 2213                Author: Cyndia Diver, RN  Service: --  Author Type: Registered Nurse       Filed: 12/18/21 2215  Date of Service: 12/18/21 2213  Status: Signed          Editor: Cyndia Diver, RN (Registered Nurse)               Hb at 4.3 post po.dt tigehen paged and said I give 1 unit of packed RBC and do a repeat hemoglobin after  2hrs post transfusion and q8 there after.

## 2021-12-18 NOTE — Progress Notes (Signed)
Hospitalist Progress Note    Subjective:   Daily Progress Note: 12/18/2021 7:23 AM    Hospital Course: Carlos Petersen is a 71 y.o. male past medical history of COPD, diabetes, heart disease, hypertension, hyperlipidemia, Parkinson's disease, pulmonary embolism on anticoagulation that presented to the ED on 12/17/2021 after sustaining ground-level fall 2 days prior.  In the ED x-ray of the femur and pelvis showed an acute mildly displaced subcapital fracture of the left femoral neck.  Laboratory data showed a hemoglobin 8.1.  Vital signs stable.  Requested admission for ORIF of the left hip. Ortho consulted. Plan for left ORIF on 12/18/2021. Cardiology consulted for clearance. ECHO shows EF 60-65%.       Subjective: Patient says his pain comes and goes    Current Facility-Administered Medications   Medication Dose Route Frequency    albuterol (PROVENTIL HFA, VENTOLIN HFA, PROAIR HFA) inhaler 2 Puff  2 Puff Inhalation Q6H PRN    amiodarone (CORDARONE) tablet 200 mg  200 mg Oral DAILY    carbidopa-levodopa ER (SINEMET CR) 25-100 mg per tablet 4 Tablet  4 Tablet Oral TID    carvediloL (COREG) tablet 6.25 mg  6.25 mg Oral BID    empagliflozin (JARDIANCE) tablet 10 mg  10 mg Oral DAILY    budesonide-formoteroL (SYMBICORT) 160-4.5 mcg/actuation HFA inhaler 1 Puff  1 Puff Inhalation BID RT    And    tiotropium bromide (SPIRIVA RESPIMAT) 2.5 mcg /actuation  2 Puff Inhalation DAILY    gabapentin (NEURONTIN) capsule 100 mg  100 mg Oral BID    pantoprazole (PROTONIX) tablet 40 mg  40 mg Oral DAILY    ranolazine ER (RANEXA) tablet 500 mg  500 mg Oral BID    rosuvastatin (CRESTOR) tablet 20 mg  20 mg Oral QHS    vortioxetine (TRINTELLIX) tablet 20 mg  20 mg Oral DAILY    sodium chloride (NS) flush 5-40 mL  5-40 mL IntraVENous PRN    acetaminophen (TYLENOL) tablet 650 mg  650 mg Oral Q6H PRN    polyethylene glycol (MIRALAX) packet 17 g  17 g Oral DAILY PRN    ondansetron (ZOFRAN ODT) tablet 4 mg  4 mg Oral Q8H PRN    Or     ondansetron (ZOFRAN) injection 4 mg  4 mg IntraVENous Q6H PRN    glucose chewable tablet 16 g  4 Tablet Oral PRN    glucagon (GLUCAGEN) injection 1 mg  1 mg IntraMUSCular PRN    dextrose 10% infusion 0-250 mL  0-250 mL IntraVENous PRN    insulin lispro (HUMALOG) injection   SubCUTAneous AC&HS    traMADoL (ULTRAM) tablet 50 mg  50 mg Oral Q6H PRN    celecoxib (CELEBREX) capsule 200 mg  200 mg Oral BID    morphine injection 1 mg  1 mg IntraVENous Q6H PRN    enoxaparin (LOVENOX) injection 30 mg  30 mg SubCUTAneous BID        Review of Systems  Constitutional: No fevers, No chills, No sweats, No fatigue, + Weakness  Eyes: No redness  Ears, nose, mouth, throat, and face: No nasal congestion, No sore throat, No voice change  Respiratory: No Shortness of Breath, No cough, No wheezing  Cardiovascular: No chest pain, No palpitations, No extremity edema  Gastrointestinal: No nausea, No vomiting, No diarrhea, No abdominal pain  Genitourinary: No frequency, No dysuria, No hematuria  Integument/breast: No skin lesion(s)   Neurological: No Confusion, No headaches, No dizziness  Objective:     Visit Vitals  BP (!) 115/57 (BP 1 Location: Right upper arm, BP Patient Position: At rest)   Pulse 71   Temp 98.2 F (36.8 C)   Resp 18   Ht '5\' 11"'  (1.803 m)   Wt 104.3 kg (230 lb)   SpO2 98%   BMI 32.08 kg/m      O2 Device: None (Room air)    Temp (24hrs), Avg:98.1 F (36.7 C), Min:97.8 F (36.6 C), Max:98.9 F (37.2 C)      No intake/output data recorded.  04/19 1901 - 04/21 0700  In: 900 [P.O.:900]  Out: 1675 [Urine:1675]    PHYSICAL EXAM:  Constitutional: No acute distress  Skin: Extremities and face reveal no rashes.   HEENT: Sclerae anicteric. Extra-occular muscles are intact. No oral ulcers. The neck is supple and no masses.   Cardiovascular: RRR  Respiratory:  Clear breath sounds bilaterally with no wheezes, rales, or rhonchi.   GI: Abdomen nondistended, soft, and nontender. Normal active bowel sounds.  Musculoskeletal: No  pitting edema of the lower legs. Able to move all ext  Neurological:  Patient is alert and oriented. Cranial nerves II-XII grossly intact  Psychiatric: Mood appears appropriate       Data Review    Recent Results (from the past 24 hour(s))   URINALYSIS W/ RFLX MICROSCOPIC    Collection Time: 12/17/21  9:31 AM   Result Value Ref Range    Color Yellow/Straw      Appearance Clear Clear      Specific gravity 1.012 1.003 - 1.030      pH (UA) 6.0 5.0 - 8.0      Protein Negative Negative mg/dL    Glucose Negative Negative mg/dL    Ketone 5 (A) Negative mg/dL    Bilirubin Negative Negative      Blood Negative Negative      Urobilinogen 4.0 (H) 0.1 - 1.0 EU/dL    Nitrites Negative Negative      Leukocyte Esterase Negative Negative     CBC WITH AUTOMATED DIFF    Collection Time: 12/17/21 10:39 AM   Result Value Ref Range    WBC 7.0 4.1 - 11.1 K/uL    RBC 3.44 (L) 4.10 - 5.70 M/uL    HGB 8.1 (L) 12.1 - 17.0 g/dL    HCT 27.5 (L) 36.6 - 50.3 %    MCV 79.9 (L) 80.0 - 99.0 FL    MCH 23.5 (L) 26.0 - 34.0 PG    MCHC 29.5 (L) 30.0 - 36.5 g/dL    RDW 17.0 (H) 11.5 - 14.5 %    PLATELET 257 150 - 400 K/uL    MPV 8.4 (L) 8.9 - 12.9 FL    NRBC 0.0 0.0 PER 100 WBC    ABSOLUTE NRBC 0.00 0.00 - 0.01 K/uL    NEUTROPHILS 65 32 - 75 %    LYMPHOCYTES 20 12 - 49 %    MONOCYTES 9 5 - 13 %    EOSINOPHILS 4 0 - 7 %    BASOPHILS 1 0 - 1 %    IMMATURE GRANULOCYTES 1 (H) 0 - 0.5 %    ABS. NEUTROPHILS 4.6 1.8 - 8.0 K/UL    ABS. LYMPHOCYTES 1.4 0.8 - 3.5 K/UL    ABS. MONOCYTES 0.6 0.0 - 1.0 K/UL    ABS. EOSINOPHILS 0.3 0.0 - 0.4 K/UL    ABS. BASOPHILS 0.1 0.0 - 0.1 K/UL    ABS. IMM. GRANS. 0.1 (  H) 0.00 - 0.04 K/UL    DF AUTOMATED     TYPE & SCREEN    Collection Time: 12/17/21 10:39 AM   Result Value Ref Range    Crossmatch Expiration 12/20/2021,2359     ABO/Rh(D) A Negative     Antibody screen Negative    METABOLIC PANEL, COMPREHENSIVE    Collection Time: 12/17/21 10:39 AM   Result Value Ref Range    Sodium 134 (L) 136 - 145 mmol/L    Potassium 3.8 3.5 -  5.1 mmol/L    Chloride 105 97 - 108 mmol/L    CO2 23 21 - 32 mmol/L    Anion gap 6 5 - 15 mmol/L    Glucose 107 (H) 65 - 100 mg/dL    BUN 10 6 - 20 mg/dL    Creatinine 0.72 0.70 - 1.30 mg/dL    BUN/Creatinine ratio 14 12 - 20      eGFR >60 >60 ml/min/1.72m    Calcium 7.6 (L) 8.5 - 10.1 mg/dL    Bilirubin, total 0.5 0.2 - 1.0 mg/dL    AST (SGOT) 15 15 - 37 U/L    ALT (SGPT) <6 (L) 12 - 78 U/L    Alk. phosphatase 82 45 - 117 U/L    Protein, total 7.3 6.4 - 8.2 g/dL    Albumin 2.1 (L) 3.5 - 5.0 g/dL    Globulin 5.2 (H) 2.0 - 4.0 g/dL    A-G Ratio 0.4 (L) 1.1 - 2.2     EKG, 12 LEAD, INITIAL    Collection Time: 12/17/21 11:29 AM   Result Value Ref Range    Ventricular Rate 73 BPM    Atrial Rate 73 BPM    P-R Interval 258 ms    QRS Duration 132 ms    Q-T Interval 454 ms    QTC Calculation (Bezet) 500 ms    Calculated R Axis 40 degrees    Calculated T Axis 27 degrees    Diagnosis       Sinus rhythm with 1st degree A-V block  Non-specific intra-ventricular conduction block  Possible Inferior infarct , age undetermined  Abnormal ECG    Confirmed by [facility] [id number] on 12/17/2021 7:51:53 PM     ECHO ADULT COMPLETE    Collection Time: 12/17/21  2:33 PM   Result Value Ref Range    LV EDV A2C 56 mL    LV EDV A4C 103 mL    LV ESV A2C 18 mL    LV ESV A4C 38 mL    IVSd 0.9 0.6 - 1.0 cm    LVIDd 5.6 4.2 - 5.9 cm    LVIDd M-mode 6.2 (A) 4.2 - 5.9 cm    LVIDs 3.9 cm    LVIDs M-mode 4.4 cm    LVOT Diameter 2.1 cm    LVOT Mean Gradient 4 mmHg    LVOT VTI 26.7 cm    LVOT Peak Velocity 1.4 m/s    LVOT Peak Gradient 8 mmHg    LVPWd 1.0 0.6 - 1.0 cm    LVPWd M-mode 1.1 (A) 0.6 - 1.0 cm    LV E' Lateral Velocity 10 cm/s    LV E' Septal Velocity 8 cm/s    LV Ejection Fraction A2C 67 %    LV Ejection Fraction A4C 63 %    LVOT Area 3.5 cm2    LVOT SV 92.4 ml    LA Minor Axis 5.3 cm    LA Major Axis 5.3  cm    LA Area 2C 16.6 cm2    LA Area 4C 18.2 cm2    LA Volume BP 48 18 - 58 mL    LA Diameter 3.0 cm    RA Area 4C 16.9 cm2    RA Volume  43 ml    AR PHT 601.0 ms    AR Max Velocity PISA 3.3 m/s    AV Peak Velocity 1.6 m/s    AV Peak Gradient 10 mmHg    AV Cusp Mmode 1.8 cm    AV Mean Gradient 6 mmHg    AV VTI 23.4 cm    AV Mean Velocity 1.1 m/s    AV Area by VTI 4.0 cm2    AV Area by Peak Velocity 3.0 cm2    Aortic Root 3.1 cm    Ascending Aorta 3.3 cm    MV E Wave Deceleration Time 222.0 ms    MV A Velocity 0.61 m/s    MV E Velocity 0.53 m/s    PV Max Velocity 1.0 m/s    PV Peak Gradient 4 mmHg    RVOT Mean Gradient 1 mmHg    RVOT VTI 17.7 cm    RVOT Peak Velocity 0.8 m/s    RVOT Peak Gradient 2 mmHg    RV Basal Dimension 3.7 cm    RV Longitudinal Dimension 7.8 cm    RV Mid Dimension 3.4 cm    TAPSE 1.9 1.7 cm    TR Max Velocity 2.44 m/s    TR Peak Gradient 24 mmHg    Fractional Shortening 2D 30 28 - 44 %    LV ESV Index A4C 17 mL/m2    LV EDV Index A4C 46 mL/m2    LV ESV Index A2C 8 mL/m2    LV EDV Index A2C 25 mL/m2    LVIDd Index 2.50 cm/m2    LVIDs Index 1.74 cm/m2    LV RWT Ratio 0.36     LV Mass 2D 205.5 88 - 224 g    LV Mass 2D Index 91.7 49 - 115 g/m2    MV E/A 0.87     E/E' Ratio (Averaged) 5.96     E/E' Lateral 5.30     E/E' Septal 6.63     LA Volume Index BP 21 16 - 34 ml/m2    LVOT Stroke Volume Index 41.3 mL/m2    LA Size Index 1.34 cm/m2    LA/AO Root Ratio 0.97     RA Volume Index A4C 19 mL/m2    Ao Root Index 1.38 cm/m2    Ascending Aorta Index 1.47 cm/m2    AV Velocity Ratio 0.88     LVOT:AV VTI Index 1.14     AVA/BSA VTI 1.8 cm2/m2    AVA/BSA Peak Velocity 1.3 cm2/m2    EF BP 62 55 - 100 %   GLUCOSE, POC    Collection Time: 12/17/21  4:27 PM   Result Value Ref Range    Glucose (POC) 104 (H) 65 - 100 mg/dL    Performed by Eino Farber    GLUCOSE, POC    Collection Time: 12/17/21  9:45 PM   Result Value Ref Range    Glucose (POC) 106 (H) 65 - 100 mg/dL    Performed by Charlean Sanfilippo    METABOLIC PANEL, BASIC    Collection Time: 12/18/21  5:59 AM   Result Value Ref Range    Sodium 136 136 - 145 mmol/L    Potassium  3.9 3.5 - 5.1  mmol/L    Chloride 107 97 - 108 mmol/L    CO2 24 21 - 32 mmol/L    Anion gap 5 5 - 15 mmol/L    Glucose 99 65 - 100 mg/dL    BUN 12 6 - 20 mg/dL    Creatinine 0.65 (L) 0.70 - 1.30 mg/dL    BUN/Creatinine ratio 18 12 - 20      eGFR >60 >60 ml/min/1.6m    Calcium 7.9 (L) 8.5 - 10.1 mg/dL       Radiology review: ECHO    MDM Discussion   Patient with multiple medical comorbidities, each with high likelihood for morbidity and mortality if left untreated.   I have reviewed patient's presenting subjective and objective findings, as well as all laboratory studies, imaging studies, and vital signs to date as well as treatment rendered and patient's response to those treatments.  In addition, prior medical, surgical and relevant social and family histories were reviewed. I have discussed management plan with patient/family and with nursing staff.    Assessment:   1.  Acute mildly displaced subcapital fracture of the left femoral neck  2.  Heart disease/hypertension  3.  Hyperlipidemia  4.  Type 2 diabetes  5.  COPD  6.  Parkinson's disease  7.  History of PE  8.  Chronic anemia    Plan:    1.  X-rays of the left hip and pelvis shows left femoral neck fracture.  Orthopedics consulted.  We will get postop PT and OT.  Plan for surgery in the a.m.  Tylenol, tramadol, IV morphine as needed for pain.  2.  EKG NSR with 1st degree AV block.  2D echo with  normal EF.  Cardiac telemetry.  Consult cardiology for preop clearance.  Chest x-ray no acute process.  Patient on amiodarone 200 milligrams daily, Coreg 6.25 mg twice daily, Ranexa 500 mg twice daily.  Continue to monitor blood pressure per unit protocol  3.  On Crestor 20 mg daily at bedtime  4.  Continue with Jardiance 10 mg daily.  Insulin sliding scale.  5.  Oxygen saturation within normal limits on room air.  Chest x-ray pending.  Symbicort/Spiriva.  Albuterol as needed  6.  Continue with Sinemet 3 times a day  7.  We will hold patient's Eliquis.  Lovenox for DVT  prophylaxis.  Patient is not hypoxic.  Will resume postop day 1  8.  Hemoglobin is low but stable.  Continue to monitor.  No signs of active bleeding.  Patient becomes hemodynamically unstable or hemoglobin drops was for 7 transfuse  9.  CBC BMP in the a.m       Dispo >48 hours. Barriers include surgery and post op PT/OT. Suspect will need placement    MDM  Reviewed: previous chart, nursing note and vitals  Reviewed previous: labs  Interpretation: labs and x-ray  Consults: orthopedics and cardiology        CODE STATUS Will remain full code for surgery than change to DNR after     DVT prophylaxis: Hold due to surgery  Ulcer prophylaxis: PE. Lopezdiscussed with: Patient/Family, Nurse, and Case Manager    Total time spent with patient: 36 minutes.

## 2021-12-18 NOTE — Progress Notes (Signed)
Progress  Notes by Alfonse Ras at 12/18/21 1603                Author: Alfonse Ras  Service: NURSING  Author Type: Licensed Nurse       Filed: 12/18/21 1605  Date of Service: 12/18/21 1603  Status: Signed          Editor: Alfonse Ras (Licensed Nurse)               TRANSFER - OUT REPORT:      Verbal report given to Lordwin RN on Sigurd Sos  being transferred to [id number] for routine progression of care         Report consisted of patients Situation, Background, Assessment and    Recommendations(SBAR).       Information from the following report(s) SBAR, Kardex, ED Summary, Intake/Output, MAR, and Pre Procedure Checklist was reviewed with the receiving nurse.      Lines:      Peripheral IV 12/17/21 Anterior;Right Forearm (Active)         Site Assessment  Clean, dry, & intact  12/17/21 2315     Phlebitis Assessment  0  12/17/21 2315     Dressing Status  Clean, dry, & intact  12/17/21 2315     Dressing Type  Transparent  12/17/21 2315         Hub Color/Line Status  Capped;Flushed  12/17/21 2315            Opportunity for questions and clarification was provided.        Patient transported with:    Patient-specific medications from Pharmacy

## 2021-12-18 NOTE — Anesthesia Post-Procedure Evaluation (Signed)
Anesthesia Postprocedure Evaluation by Marcie Bal, MD at 12/18/21 2200                Author: Marcie Bal, MD  Service: ANESTHESIOLOGY  Author Type: Physician       Filed: 12/22/21 1200  Date of Service: 12/18/21 2200  Status: Signed          Editor: Marcie Bal, MD (Physician)                  Procedure(s):   HIP HEMIARTHROPLASTY ON LEFT.      spinal, general - backup      Anesthesia Post Evaluation         Multimodal analgesia: multimodal analgesia used between 6 hours prior to anesthesia start to PACU discharge   Patient location during evaluation: PACU  Patient participation: complete - patient participated  Level of consciousness: awake  Pain score: 0  Pain management: adequate  Airway patency: patent  Anesthetic complications: no  Cardiovascular  status: acceptable  Respiratory status: acceptable  Hydration status: acceptable  Post anesthesia nausea  and vomiting:  controlled  Final Post Anesthesia Temperature Assessment:  Normothermia (36.0-37.5 degrees C)         INITIAL Post-op Vital signs:        Vitals  Value  Taken Time         BP  100/53  12/18/21 2115     Temp  36.9 C (98.4 F)  12/18/21 2115     Pulse  72  12/18/21 2115     Resp  15  12/18/21 2115         SpO2  99 %  12/18/21 2115

## 2021-12-18 NOTE — Anesthesia Pre-Procedure Evaluation (Signed)
Relevant Problems   RESPIRATORY SYSTEM   (+) Panlobular emphysema (HCC)      NEUROLOGY   (+) Depression   (+) Major depressive disorder, recurrent, mild   (+) Major depressive disorder, recurrent, moderate   (+) Major depressive disorder, recurrent, unspecified      CARDIOVASCULAR   (+) PAD (peripheral artery disease) (HCC)   (+) Pacemaker   (+) Permanent atrial fibrillation (HCC)      GASTROINTESTINAL   (+) Gastroesophageal reflux disease with esophagitis without hemorrhage      RENAL FAILURE   (+) Chronic renal disease, stage III      ENDOCRINE   (+) Severe obesity (BMI 35.0-39.9) with comorbidity (HCC)       Anesthetic History   No history of anesthetic complications            Review of Systems / Medical History  Patient summary reviewed, nursing notes reviewed and pertinent labs reviewed    Pulmonary    COPD: moderate      Smoker      Comments: Study Result    Narrative & Impression  INDICATION:  preop     EXAM: Chest single view.    COMPARISON: 11/19/2021.    FINDINGS: A single frontal view of the chest at 1137 hours shows stable patchy  opacity in the right midlung field, and perihilar and bibasilar minimal  atelectasis. No new focal consolidation or pleural effusion. No CHF pattern..   The heart, mediastinum and pulmonary vasculature are stable with cardiomegaly.  AICD from the left appears stable. .  The bony thorax is unremarkable for age. [name]    IMPRESSION  Stable minimal atelectasis or scar. Stable right midlung field parenchymal  opacity. No new acute disease.     Neuro/Psych   Within defined limits           Cardiovascular    Hypertension        Dysrhythmias : atrial fibrillation  Pacemaker, PAD and hyperlipidemia      Comments: Patient las took his Eliquis two days ago      11/2021 EKG:    Sinus rhythm with 1st degree A-V block   Non-specific intra-ventricular conduction block   Possible Inferior infarct , age undetermined   Abnormal ECG       11/2021 TTE:    Interpretation Summary      .   LeftVentricle: Normal left ventricular systolic function with a visually estimated EF of 60 - 65%. EF by 2D Simpsons Biplane is 62%. Left ventricle size is normal. LVIDd index is 2.50 cm/m2. Normal wall thickness. Mass index 2D is 91.7 g/m2. Normal wall motion. Normal diastolic function.  .  Technical qualifiers: Echo study was technically difficult.     GI/Hepatic/Renal     GERD           Endo/Other    Diabetes: type 2    Anemia     Other Findings   Comments: Procedure Information    Case: [id number] Date/Time: 12/18/21 1804  Procedure: HIP HEMIARTHROPLASTY ON LEFT (Left)  Anesthesia type: General  Pre-op diagnosis: LEFT HIP FRACTURE  [facility]: [facility]  Surgeons: Smith Mince, MD      Medical History  Depression  Diabetes (HCC)  Parkinson disease (HCC)  Hypercholesterolemia  Heart disease  COPD (chronic obstructive pulmonary disease) (HCC)  Pulmonary embolism (HCC)  Emphysema lung (HCC)           Physical Exam    Airway  Mallampati: II  TM Distance: 4 - 6 cm  Neck ROM: normal range of motion   Mouth opening: Normal     Cardiovascular    Rhythm: regular  Rate: normal         Dental    Dentition: Full lower dentures and Full upper dentures     Pulmonary  Breath sounds clear to auscultation               Abdominal  GI exam deferred       Other Findings            Anesthetic Plan    ASA: 4  Anesthesia type: spinal and general - backup    Monitoring Plan: Continuous noninvasive hemodynamic monitoring        Anesthetic plan and risks discussed with: Patient      Benefits and risks of spinal anesthesia discussed with patient/family.  All questions answered.

## 2021-12-18 NOTE — Progress Notes (Signed)
Progress  Notes by Jessee Avers, RN at 12/18/21 1136                Author: Jessee Avers, RN  Service: CASE MANAGEMENT  Author Type: Care Management       Filed: 12/18/21 1139  Date of Service: 12/18/21 1136  Status: Signed          Editor: Jessee Avers, RN (Care Management)               CM reviewed chart. Patient is pending surgery today and will need PT/OT post procedure. CM team will continue to follow patient as needs arise.

## 2021-12-18 NOTE — Progress Notes (Signed)
Progress Notes by Moody Bruins, PA-C at 12/18/21 9937                Author: Moody Bruins, PA-C  Service: Physician Assistant  Author Type: Physician Assistant       Filed: 12/18/21 0814  Date of Service: 12/18/21 0806  Status: Signed           Editor: Lenny Pastel (Physician Assistant)  Cosigner: Williemae Natter, MD at 12/18/21 1138                    Orthopedic progress note      Date:12/18/2021       Room:436/01   Patient Name:Carlos Petersen     Date of Birth:1951/03/12     Age:71 y.o.            Subjective       Follow-up on a 71 year old male with a left hip fracture.   Patient is complaining of moderate pain of his left hip this morning.  Does not feel tramadol is helping.   No other complaints at this time.   Objective                      Vitals Last 24 Hours:   TEMPERATURE:  Temp  Avg: 98.1 F (36.7 C)  Min: 97.8 F (36.6 C)  Max: 98.9 F (37.2 C)   RESPIRATIONS RANGE: Resp  Avg: 16.8  Min: 15  Max: 18   PULSE OXIMETRY RANGE: SpO2  Avg: 94.2 %  Min: 92 %  Max: 98 %   PULSE RANGE: Pulse  Avg: 73.3  Min: 70  Max: 82   BLOOD PRESSURE RANGE: Systolic (16RCV), ELF:810 , Min:108 , FBP:102    ; Diastolic (58NID), POE:42, Min:54, Max:98        Current Facility-Administered Medications          Medication  Dose  Route  Frequency           ?  albuterol (PROVENTIL HFA, VENTOLIN HFA, PROAIR HFA) inhaler 2 Puff   2 Puff  Inhalation  Q6H PRN     ?  amiodarone (CORDARONE) tablet 200 mg   200 mg  Oral  DAILY     ?  carbidopa-levodopa ER (SINEMET CR) 25-100 mg per tablet 4 Tablet   4 Tablet  Oral  TID     ?  carvediloL (COREG) tablet 6.25 mg   6.25 mg  Oral  BID     ?  empagliflozin (JARDIANCE) tablet 10 mg   10 mg  Oral  DAILY     ?  budesonide-formoteroL (SYMBICORT) 160-4.5 mcg/actuation HFA inhaler 1 Puff   1 Puff  Inhalation  BID RT          And           ?  tiotropium bromide (SPIRIVA RESPIMAT) 2.5 mcg /actuation   2 Puff  Inhalation  DAILY     ?  gabapentin (NEURONTIN) capsule 100 mg   100 mg   Oral  BID     ?  pantoprazole (PROTONIX) tablet 40 mg   40 mg  Oral  DAILY     ?  ranolazine ER (RANEXA) tablet 500 mg   500 mg  Oral  BID     ?  rosuvastatin (CRESTOR) tablet 20 mg   20 mg  Oral  QHS     ?  vortioxetine (TRINTELLIX) tablet 20 mg  20 mg  Oral  DAILY     ?  sodium chloride (NS) flush 5-40 mL   5-40 mL  IntraVENous  PRN     ?  acetaminophen (TYLENOL) tablet 650 mg   650 mg  Oral  Q6H PRN     ?  polyethylene glycol (MIRALAX) packet 17 g   17 g  Oral  DAILY PRN     ?  ondansetron (ZOFRAN ODT) tablet 4 mg   4 mg  Oral  Q8H PRN          Or           ?  ondansetron (ZOFRAN) injection 4 mg   4 mg  IntraVENous  Q6H PRN     ?  glucose chewable tablet 16 g   4 Tablet  Oral  PRN     ?  glucagon (GLUCAGEN) injection 1 mg   1 mg  IntraMUSCular  PRN     ?  dextrose 10% infusion 0-250 mL   0-250 mL  IntraVENous  PRN     ?  insulin lispro (HUMALOG) injection     SubCUTAneous  AC&HS           ?  traMADoL (ULTRAM) tablet 50 mg   50 mg  Oral  Q6H PRN           ?  celecoxib (CELEBREX) capsule 200 mg   200 mg  Oral  BID     ?  morphine injection 1 mg   1 mg  IntraVENous  Q6H PRN           ?  enoxaparin (LOVENOX) injection 30 mg   30 mg  SubCUTAneous  BID               I/O (24Hr):      Intake/Output Summary (Last 24 hours) at 12/18/2021 0807   Last data filed at 12/18/2021 0502     Gross per 24 hour        Intake  900 ml        Output  1675 ml        Net  -775 ml        Objective:   Vital signs: (most recent): Blood pressure 111/60, pulse 72, temperature 97.8 F (36.6 C), resp. rate 16, height '5\' 11"'  (1.803 m), weight 104.3 kg (230 lb), SpO2 95 %.        Labs/Imaging/Diagnostics       Labs:   CBC:     Recent Labs            12/18/21   0559  12/17/21   1039     WBC  5.9  7.0     RBC  3.52*  3.44*     HGB  8.4*  8.1*     HCT  28.3*  27.5*     MCV  80.4  79.9*     RDW  17.0*  17.0*         PLT  252  257        CHEMISTRIES:     Recent Labs            12/18/21   0559  12/17/21   1039     NA  136  134*     K  3.9  3.8     CL   107  105     CO2  24  23     BUN  12  10     CREA  0.65*  0.72         CA  7.9*  7.6*     PT/INR:No results for input(s): INR, INREXT in the last 72 hours.      No lab exists for component: PROTIME   APTT:No results for input(s): APTT in the last 72 hours.   LIVER PROFILE:     Recent Labs           12/17/21   1039     AST  15        ALT  <6*          Lab Results         Component  Value  Date/Time            ALT (SGPT)  <6 (L)  12/17/2021 10:39 AM       AST (SGOT)  15  12/17/2021 10:39 AM       Alk. phosphatase  82  12/17/2021 10:39 AM            Bilirubin, total  0.5  12/17/2021 10:39 AM           Physical Exam:   Left lower extremity: Exam unchanged.  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  DP/PT pulse are palpable.  No calf pain.      Assessment//Plan                        Patient Active Problem List           Diagnosis  Date Noted         ?  Hip fracture ([place])  12/17/2021     ?  PAD (peripheral artery disease) ([place])  07/17/2021     ?  Severe obesity (BMI 35.0-39.9) with comorbidity ([place])  05/18/2021     ?  Chronic renal disease, stage III  01/22/2021     ?  Hypotension due to drugs  01/22/2021     ?  Depression  05/06/2020     ?  Implantable cardioverter-defibrillator (ICD) in situ  05/06/2020     ?  Permanent atrial fibrillation ([place])  05/06/2020     ?  Major depressive disorder, recurrent, mild  05/06/2020     ?  Major depressive disorder, recurrent, moderate  05/06/2020     ?  Major depressive disorder, recurrent, unspecified  05/06/2020     ?  Hypertensive heart disease  11/09/2019     ?  Mixed hyperlipidemia  11/09/2019     ?  Gastroesophageal reflux disease with esophagitis without hemorrhage  11/09/2019     ?  Parkinson disease ([place])  11/09/2019     ?  Pacemaker  11/09/2019     ?  Panlobular emphysema ([place])  11/09/2019     ?  Tobacco abuse counseling  11/09/2019     ?  Tobacco dependency  11/09/2019     ?  Fall  11/09/2019         ?  Acute pain of  right shoulder  11/09/2019        Left femoral neck fracture   Plan for the OR today with Dr. Salli Quarry.   Will adjust pain medication accordingly.   We will start maintenance IV fluids.         Electronically signed by Moody Bruins, PA-C on 12/18/2021 at 8:07 AM

## 2021-12-18 NOTE — Progress Notes (Signed)
Progress Notes by Donnamae Jude, FNP at 12/18/21 1436                Author: Donnamae Jude, FNP  Service: Cardiology  Author Type: Nurse Practitioner       Filed: 12/18/21 1445  Date of Service: 12/18/21 1436  Status: Attested           Editor: Donnamae Jude, FNP (Nurse Practitioner)  Cosigner: Robley Fries, MD at 12/18/21 1644          Attestation signed by Robley Fries, MD at 12/18/21 1644          I have seen and examined patient and agree with the assesment and plan as documented by Macario Golds, NP. Examination findings notable for stable vital signs  and exam.  Plan is to continue with current cardiac medications and work up, as well as other items noted in above.                                     CARDIOLOGY PROGESS NOTE         Carlos Petersen is a 71 y.o. year-old male with past medical history significant for PE, COPD, HLD, Parkinson's, and DM who was evaluated today due to fall. We have been consulted for cardiac risk assessment for pending Left hip surgery.       Patient seen and examined,. He is lying in bed, no acute distress, on room air. He reports dizziness. He denies chest pain, shortness of breath, palpitations or edema.       Records from hospital admission course thus far reviewed.       Telemetry reviewed.       INPATIENT MEDICATIONS:  Home medications reviewed.     Current Facility-Administered Medications:    ?  oxyCODONE IR (ROXICODONE) tablet 5 mg, 5 mg, Oral, Q4H PRN, Young, Chevin L, PA-C   ?  0.9% sodium chloride infusion, 50 mL/hr, IntraVENous, CONTINUOUS, Young, Chevin L, PA-C, Last Rate: 50 mL/hr at 12/18/21 0845, 50 mL/hr  at 12/18/21 0845   ?  albuterol (PROVENTIL HFA, VENTOLIN HFA, PROAIR HFA) inhaler 2 Puff, 2 Puff, Inhalation, Q6H PRN, Bishop, Krysten, PA-C   ?  amiodarone (CORDARONE) tablet 200 mg, 200 mg, Oral, DAILY, Bishop, Krysten, PA-C, 200 mg at 12/18/21 0846   ?  carbidopa-levodopa ER (SINEMET CR) 25-100 mg per tablet 4 Tablet, 4 Tablet, Oral,  TID, Bishop, Krysten, PA-C, 4 Tablet at 12/18/21 0845   ?  carvediloL (COREG) tablet 6.25 mg, 6.25 mg, Oral, BID, Bishop, Krysten, PA-C, 6.25 mg at 12/18/21 0846   ?  empagliflozin (JARDIANCE) tablet 10 mg, 10 mg, Oral, DAILY, Bishop, Krysten, PA-C, 10 mg at 12/18/21 0845   ?  budesonide-formoteroL (SYMBICORT) 160-4.5 mcg/actuation HFA inhaler 1 Puff, 1 Puff, Inhalation, BID RT, 1 Puff at 12/18/21 0853 **AND**  tiotropium bromide (SPIRIVA RESPIMAT) 2.5 mcg /actuation, 2 Puff, Inhalation, DAILY, Bishop, [place], PA-C, 2 Puff at 12/18/21 3382   ?  gabapentin (NEURONTIN) capsule 100 mg, 100 mg, Oral, BID, Bishop, Krysten, PA-C, 100 mg at 12/18/21 0846   ?  pantoprazole (PROTONIX) tablet 40 mg, 40 mg, Oral, DAILY, Bishop, Krysten, PA-C, 40 mg at 12/18/21 0846   ?  ranolazine ER (RANEXA) tablet 500 mg, 500 mg, Oral, BID, Bishop, Krysten, PA-C, 500 mg at 12/18/21 0846   ?  rosuvastatin (CRESTOR) tablet 20 mg, 20 mg, Oral,  QHS, Bishop, [place], PA-C, 20 mg at 12/17/21 2146   ?  vortioxetine (TRINTELLIX) tablet 20 mg, 20 mg, Oral, DAILY, Bishop, Krysten, PA-C   ?  sodium chloride (NS) flush 5-40 mL, 5-40 mL, IntraVENous, PRN, Bishop, Krysten, PA-C   ?  acetaminophen (TYLENOL) tablet 650 mg, 650 mg, Oral, Q6H PRN **OR** [DISCONTINUED] acetaminophen (TYLENOL) suppository 650 mg, 650 mg,  Rectal, Q6H PRN, Bishop, Krysten, PA-C   ?  polyethylene glycol (MIRALAX) packet 17 g, 17 g, Oral, DAILY PRN, Bishop, Krysten, PA-C   ?  ondansetron (ZOFRAN ODT) tablet 4 mg, 4 mg, Oral, Q8H PRN **OR** ondansetron (ZOFRAN) injection 4 mg, 4 mg, IntraVENous, Q6H PRN, Bishop,  Krysten, PA-C   ?  glucose chewable tablet 16 g, 4 Tablet, Oral, PRN, Bishop, Krysten, PA-C   ?  glucagon (GLUCAGEN) injection 1 mg, 1 mg, IntraMUSCular, PRN, Bishop, Krysten, PA-C   ?  dextrose 10% infusion 0-250 mL, 0-250 mL, IntraVENous, PRN, Bishop, Krysten, PA-C   ?  insulin lispro (HUMALOG) injection, , SubCUTAneous, AC&HS, Bishop, Krysten, PA-C   ?  celecoxib  (CELEBREX) capsule 200 mg, 200 mg, Oral, BID, Young, Chevin L, PA-C, 200 mg at 12/18/21 0846   ?  morphine injection 1 mg, 1 mg, IntraVENous, Q6H PRN, Young, Chevin L, PA-C, 1 mg at 12/18/21 0846       ALLERGIES:  Allergies reviewed with the patient,No Known Allergies .      REVIEW OF SYSTEMS:  Complete review of systems performed, pertinents noted above, all other systems are negative.      Physical examination :   No apparent distress.   Heart is regular, rate and rhythm. Normal S1, S2, no murmurs are appreciated.   Lungs are clear bilaterally.   Abdomen is soft, nontender, normal bowel sounds.   Extremities have no edema.      Visit Vitals      BP  111/60 (BP 1 Location: Right upper arm, BP Patient Position: At rest;Supine)        Pulse  72     Temp  97.8 F (36.6 C)     Resp  16     Ht  5\' 11"  (1.803 m)     Wt  104.3 kg (230 lb)     SpO2  96%        BMI  32.08 kg/m           Recent labs results and imaging reviewed.       Discussed case with Dr. and our impression and recommendations are as follows:   1.  Pre-operative cardiac risk assessment: Pending Left hip surgery for displaced subcapital/femoral neck fx, secondary to mechanical fall. He  denies active symptoms. Echocardiogram with preserved EF, no WMA, he is at an intermediate risk for surgery. He reports last dose of Eliquis was last night. Continue to hold. Recommend BB during perioperative period.    2.  VT: S/p ICD ([employer])  Continue amiodarone.    3.  PE: Hx of, Eliquis on hold due to pending surgery. Resume when cleared by Ortho.    4.  COPD: Primary following.       Please do not hesitate to call me or Dr. Dillon Bjork if additional questions arise.      Dillon Bjork, FNP   12/18/2021

## 2021-12-18 NOTE — Op Note (Signed)
Brief  Op Note by Smith Mince, MD at 12/18/21 2050                Author: Smith Mince, MD  Service: Orthopedic Surgery  Author Type: Physician       Filed: 12/18/21 2051  Date of Service: 12/18/21 2050  Status: Signed          Editor: Smith Mince, MD (Physician)                  Brief Postoperative Note      Patient: Carlos Petersen   Date of Birth: 06-18-1951   MRN: [id number]      Date of Procedure: 12/18/2021       Pre-Op Diagnosis: Left femoral neck fracture      Post-Op Diagnosis:         Procedure(s):   HIP HEMIARTHROPLASTY ON LEFT      Surgeon(s):   Smith Mince, MD      Surgical Assistant: None      Anesthesia: General       Estimated Blood Loss (mL): 200       Complications: None      Specimens:            ID  Type  Source  Tests  Collected by  Time  Destination             1 : urine culture  Urine  Foley Specimen  CULTURE, URINE  Smith Mince, MD  12/18/2021 1940  Microbiology            Implants:              Implant Name  Type  Inv. Item  Serial No.  Manufacturer  Lot No.  LRB  No. Used  Action               SPACER FEM 12/14 TAPR -3 --  - SN/A    SPACER FEM 12/14 TAPR -3 --   N/A  [employer]  [id number]  Left  1  Implanted     STEM FEM SZ 5 HIP STD OFFSET CLLRD CEMENTLESS 12/14 TAPR - SN/A    STEM FEM SZ 5 HIP STD OFFSET CLLRD CEMENTLESS 12/14 TAPR  N/A  [employer]  [id number]  Left  1  Implanted               HEAD FEM MOD CATHCRT --  - SN/A    HEAD FEM MOD CATHCRT --   N/A  [employer]  [id number]  Left  1  Implanted           Drains: * No LDAs found *      Findings: As expected      Electronically Signed by Smith Mince, MD on 12/18/2021 at 8:50 PM

## 2021-12-18 NOTE — Anesthesia Procedure Notes (Signed)
Anesthesia Procedure Notes by Wellington Hampshire, CRNA at 12/18/21 1827                Author: Wellington Hampshire, CRNA  Service: Certified Registered Nurse Anesthetist  Author Type: Nurse Anesthetist       Filed: 12/18/21 1852  Date of Service: 12/18/21 1827  Status: Addendum          Editor: Wellington Hampshire, CRNA (Nurse Anesthetist)          Related Notes: Original Note by Wellington Hampshire, CRNA (Nurse Anesthetist) filed at 12/18/21  1829            Procedure Orders        1. Spinal Block [[id number]] ordered by Wellington Hampshire, CRNA                               Spinal Block      Start time: 12/18/2021 6:22 PM   End time: 12/18/2021 6:26 PM   Performed by: Wellington Hampshire, CRNA   Authorized by:  Hope, Karrie Doffing., MD       Pre-procedure:   Indications: primary anesthetic  Preanesthetic Checklist: patient identified, risks and benefits discussed, anesthesia consent, site marked, patient being monitored, timeout performed and fire risk  safety assessment completed and verbalized        Spinal Block:    Patient Position:  Left lateral decubitus      Prep: chlorhexidine        Location:  L3-4   Technique:  Single shot         Med Admin Time: 12/18/2021 6:26 PM      Needle:    Needle Type:  Pencil-tip   Needle Gauge:  27 G   Attempts:  1         Events: CSF confirmed           Assessment:   Insertion:  Uncomplicated   Patient tolerance:  Patient tolerated the procedure well with no immediate complications

## 2021-12-18 NOTE — Progress Notes (Signed)
Patient was admitted to the hospital service with a fracture of the left femoral neck.  The patient has been stable.  On examination the patient surprisingly has no pain with internal or external rotation of the left hip.  No tenderness anywhere in the right lower limb.  No tenderness in both upper limbs.  Neurovascular intact both hands.  Abdomen and chest nontender.  Neurovascular intact both feet.  x-rays shows a displaced fracture of the left femoral neck.  Plan is hemiarthroplasty left hip today uncemented.  Obtain CT scan no contrast of the left hip since the patient has no pain with motion of the left hip.

## 2021-12-19 LAB — GLUCOSE, POC
Glucose (POC): 95 mg/dL (ref 65–100)
Glucose (POC): 96 mg/dL (ref 65–100)
Glucose (POC): 125 mg/dL — ABNORMAL HIGH (ref 65–100)
Glucose (POC): 136 mg/dL — ABNORMAL HIGH (ref 65–100)

## 2021-12-19 LAB — CBC W/O DIFF
ABSOLUTE NRBC: 0 10*3/uL (ref 0.00–0.01)
HCT: 14.9 % — CL (ref 36.6–50.3)
HGB: 4.3 g/dL — CL (ref 12.1–17.0)
MCH: 23.6 PG — ABNORMAL LOW (ref 26.0–34.0)
MCHC: 28.9 g/dL — ABNORMAL LOW (ref 30.0–36.5)
MCV: 81.9 FL (ref 80.0–99.0)
MPV: 8.6 FL — ABNORMAL LOW (ref 8.9–12.9)
NRBC: 0 PER 100 WBC
PLATELET: 158 10*3/uL (ref 150–400)
RBC: 1.82 M/uL — ABNORMAL LOW (ref 4.10–5.70)
RDW: 17 % — ABNORMAL HIGH (ref 11.5–14.5)
WBC: 5.6 10*3/uL (ref 4.1–11.1)

## 2021-12-19 LAB — METABOLIC PANEL, BASIC
Anion gap: 4 mmol/L — ABNORMAL LOW (ref 5–15)
BUN/Creatinine ratio: 22 — ABNORMAL HIGH (ref 12–20)
BUN: 13 mg/dL (ref 6–20)
CO2: 23 mmol/L (ref 21–32)
Calcium: 7.5 mg/dL — ABNORMAL LOW (ref 8.5–10.1)
Chloride: 108 mmol/L (ref 97–108)
Creatinine: 0.58 mg/dL — ABNORMAL LOW (ref 0.70–1.30)
Glucose: 84 mg/dL (ref 65–100)
Potassium: 3.9 mmol/L (ref 3.5–5.1)
Sodium: 135 mmol/L — ABNORMAL LOW (ref 136–145)
eGFR: >60 mL/min/{1.73_m2} (ref >60)

## 2021-12-19 LAB — HGB & HCT
HCT: 24.9 % — ABNORMAL LOW (ref 36.6–50.3)
HGB: 7.5 g/dL — ABNORMAL LOW (ref 12.1–17.0)

## 2021-12-19 LAB — CBC
Hematocrit: 14.9 % — CL (ref 36.6–50.3)
Hemoglobin: 4.3 g/dL — CL (ref 12.1–17.0)
MCH: 23.6 PG — ABNORMAL LOW (ref 26.0–34.0)
MCHC: 28.9 g/dL — ABNORMAL LOW (ref 30.0–36.5)
MCV: 81.9 FL (ref 80.0–99.0)
MPV: 8.6 FL — ABNORMAL LOW (ref 8.9–12.9)
NRBC Absolute: 0 10*3/uL (ref 0.00–0.01)
Nucleated RBCs: 0 PER 100 WBC
Platelets: 158 10*3/uL (ref 150–400)
RBC: 1.82 M/uL — ABNORMAL LOW (ref 4.10–5.70)
RDW: 17 % — ABNORMAL HIGH (ref 11.5–14.5)
WBC: 5.6 10*3/uL (ref 4.1–11.1)

## 2021-12-19 LAB — BASIC METABOLIC PANEL
Anion Gap: 4 mmol/L — ABNORMAL LOW (ref 5–15)
BUN: 13 mg/dL (ref 6–20)
Bun/Cre Ratio: 22 — ABNORMAL HIGH (ref 12–20)
CO2: 23 mmol/L (ref 21–32)
Calcium: 7.5 mg/dL — ABNORMAL LOW (ref 8.5–10.1)
Chloride: 108 mmol/L (ref 97–108)
Creatinine: 0.58 mg/dL — ABNORMAL LOW (ref 0.70–1.30)
ESTIMATED GLOMERULAR FILTRATION RATE: >60 mL/min/{1.73_m2} (ref >60)
Glucose: 84 mg/dL (ref 65–100)
Potassium: 3.9 mmol/L (ref 3.5–5.1)
Sodium: 135 mmol/L — ABNORMAL LOW (ref 136–145)

## 2021-12-19 LAB — POCT GLUCOSE
POC Glucose: 95 mg/dL (ref 65–100)
POC Glucose: 96 mg/dL (ref 65–100)
POC Glucose: 125 mg/dL — ABNORMAL HIGH (ref 65–100)
POC Glucose: 136 mg/dL — ABNORMAL HIGH (ref 65–100)

## 2021-12-19 LAB — HEMOGLOBIN AND HEMATOCRIT
Hematocrit: 24.9 % — ABNORMAL LOW (ref 36.6–50.3)
Hemoglobin: 7.5 g/dL — ABNORMAL LOW (ref 12.1–17.0)

## 2021-12-19 MED ORDER — SODIUM CHLORIDE 0.9 % IV
INTRAVENOUS | Status: AC
Start: 2021-12-19 — End: 2021-12-19
  Administered 2021-12-19 (×3): via INTRAVENOUS

## 2021-12-19 MED ORDER — ACETAMINOPHEN 500 MG TAB
500 mg | Freq: Four times a day (QID) | ORAL | Status: AC
Start: 2021-12-19 — End: 2021-12-24
  Administered 2021-12-19 – 2021-12-23 (×16): via ORAL

## 2021-12-19 MED ORDER — OXYCODONE 5 MG TAB
5 mg | ORAL | Status: AC | PRN
Start: 2021-12-19 — End: 2021-12-24
  Administered 2021-12-19: via ORAL

## 2021-12-19 MED ORDER — CEFAZOLIN 1 GRAM SOLUTION FOR INJECTION
1 gram | INTRAMUSCULAR | Status: AC
Start: 2021-12-19 — End: ?

## 2021-12-19 MED ORDER — SODIUM CHLORIDE 0.9 % IJ SYRG
Freq: Three times a day (TID) | INTRAMUSCULAR | Status: DC
Start: 2021-12-19 — End: 2021-12-24
  Administered 2021-12-19 – 2021-12-24 (×13): via INTRAVENOUS

## 2021-12-19 MED ORDER — NALOXONE 0.4 MG/ML INJECTION
0.4 mg/mL | INTRAMUSCULAR | Status: AC | PRN
Start: 2021-12-19 — End: 2021-12-24

## 2021-12-19 MED ORDER — SENNOSIDES-DOCUSATE SODIUM 8.6 MG-50 MG TAB
Freq: Two times a day (BID) | ORAL | Status: AC
Start: 2021-12-19 — End: 2021-12-24
  Administered 2021-12-19 – 2021-12-24 (×10): via ORAL

## 2021-12-19 MED ORDER — PHENYLEPHRINE 1 MG/10 ML (100 MCG/ML) IN NS IV SYRINGE
1 mg/0 mL (00 mcg/mL) | INTRAVENOUS | Status: AC
Start: 2021-12-19 — End: ?

## 2021-12-19 MED ORDER — ONDANSETRON (PF) 4 MG/2 ML INJECTION
4 mg/2 mL | Freq: Four times a day (QID) | INTRAMUSCULAR | Status: AC | PRN
Start: 2021-12-19 — End: 2021-12-19

## 2021-12-19 MED ORDER — PROPOFOL 10 MG/ML IV EMUL
10 mg/mL | INTRAVENOUS | Status: AC
Start: 2021-12-19 — End: ?

## 2021-12-19 MED ORDER — ENOXAPARIN 40 MG/0.4 ML SUB-Q SYRINGE
40 mg/0.4 mL | Freq: Every day | SUBCUTANEOUS | Status: DC
Start: 2021-12-19 — End: 2021-12-21
  Administered 2021-12-19 – 2021-12-20 (×2): via SUBCUTANEOUS

## 2021-12-19 MED ORDER — OXYCODONE 5 MG TAB
5 mg | ORAL | Status: AC | PRN
Start: 2021-12-19 — End: 2021-12-24
  Administered 2021-12-19 – 2021-12-20 (×2): via ORAL

## 2021-12-19 MED ORDER — SODIUM CHLORIDE 0.9 % IV
INTRAVENOUS | Status: AC | PRN
Start: 2021-12-19 — End: 2021-12-24

## 2021-12-19 MED ORDER — CEFAZOLIN 1 GRAM SOLUTION FOR INJECTION
1 gram | Freq: Three times a day (TID) | INTRAMUSCULAR | Status: AC
Start: 2021-12-19 — End: 2021-12-19
  Administered 2021-12-19 (×3): via INTRAVENOUS

## 2021-12-19 MED FILL — CARBIDOPA-LEVODOPA ER 25-100 MG TABLET, EXTENDED RELEASE: 25-100 mg | ORAL | Qty: 4

## 2021-12-19 MED FILL — SODIUM CHLORIDE 0.9 % IJ SYRG: INTRAMUSCULAR | Qty: 40

## 2021-12-19 MED FILL — JARDIANCE 10 MG TABLET: 10 mg | ORAL | Qty: 1

## 2021-12-19 MED FILL — OXYCODONE 5 MG TAB: 5 mg | ORAL | Qty: 2

## 2021-12-19 MED FILL — CELECOXIB 200 MG CAP: 200 mg | ORAL | Qty: 1

## 2021-12-19 MED FILL — ROSUVASTATIN 5 MG TAB: 5 mg | ORAL | Qty: 4

## 2021-12-19 MED FILL — CEFAZOLIN 1 GRAM SOLUTION FOR INJECTION: 1 gram | INTRAMUSCULAR | Qty: 2000

## 2021-12-19 MED FILL — CARVEDILOL 3.125 MG TAB: 3.125 mg | ORAL | Qty: 2

## 2021-12-19 MED FILL — ENOXAPARIN 40 MG/0.4 ML SUB-Q SYRINGE: 40 mg/0.4 mL | SUBCUTANEOUS | Qty: 0.4

## 2021-12-19 MED FILL — STIMULANT LAXATIVE PLUS 8.6 MG-50 MG TABLET: ORAL | Qty: 1

## 2021-12-19 MED FILL — SODIUM CHLORIDE 0.9 % IV: INTRAVENOUS | Qty: 1000

## 2021-12-19 MED FILL — GABAPENTIN 100 MG CAP: 100 mg | ORAL | Qty: 1

## 2021-12-19 MED FILL — RANOLAZINE SR 500 MG 12 HR TAB: 500 mg | ORAL | Qty: 1

## 2021-12-19 MED FILL — WATER FOR INJECTION, STERILE INJECTION: INTRAMUSCULAR | Qty: 20

## 2021-12-19 MED FILL — ACETAMINOPHEN 500 MG TAB: 500 mg | ORAL | Qty: 2

## 2021-12-19 MED FILL — SODIUM CHLORIDE 0.9 % IV: INTRAVENOUS | Qty: 250

## 2021-12-19 MED FILL — SYMBICORT 160 MCG-4.5 MCG/ACTUATION HFA AEROSOL INHALER: RESPIRATORY_TRACT | Qty: 6

## 2021-12-19 MED FILL — TYLENOL 325 MG TABLET: 325 mg | ORAL | Qty: 2

## 2021-12-19 MED FILL — PANTOPRAZOLE 40 MG TAB, DELAYED RELEASE: 40 mg | ORAL | Qty: 1

## 2021-12-19 MED FILL — AMIODARONE 200 MG TAB: 200 mg | ORAL | Qty: 1

## 2021-12-19 MED FILL — OXYCODONE 5 MG TAB: 5 mg | ORAL | Qty: 1

## 2021-12-19 MED FILL — SPIRIVA RESPIMAT 2.5 MCG/ACTUATION SOLUTION FOR INHALATION: 2.5 mcg/actuation | RESPIRATORY_TRACT | Qty: 4

## 2021-12-19 MED FILL — PHENYLEPHRINE 1 MG/10 ML (100 MCG/ML) IN NS IV SYRINGE: 1 mg/0 mL (00 mcg/mL) | INTRAVENOUS | Qty: 20

## 2021-12-19 MED FILL — PROPOFOL 10 MG/ML IV EMUL: 10 mg/mL | INTRAVENOUS | Qty: 20

## 2021-12-19 NOTE — Progress Notes (Signed)
Problem: Pain  Goal: *Control of Pain  Outcome: Progressing Towards Goal     Problem: Discharge Planning  Goal: *Discharge to safe environment  Outcome: Progressing Towards Goal     Problem: Falls - Risk of  Goal: *Absence of Falls  Description: Document Schmid Fall Risk and appropriate interventions in the flowsheet.  Outcome: Progressing Towards Goal  Note: Fall Risk Interventions:            Medication Interventions: Patient to call before getting OOB    Elimination Interventions: Call light in reach, Patient to call for help with toileting needs              Problem: Pressure Injury - Risk of  Goal: *Prevention of pressure injury  Description: Document Braden Scale and appropriate interventions in the flowsheet.  Outcome: Progressing Towards Goal  Note: Pressure Injury Interventions:            Activity Interventions: PT/OT evaluation    Mobility Interventions: PT/OT evaluation    Nutrition Interventions: Document food/fluid/supplement intake

## 2021-12-19 NOTE — Progress Notes (Signed)
PHYSICAL THERAPY EVALUATION  Patient: Carlos SosDavid B Savary ([age] y.o. male)  Date: 12/19/2021  Primary Diagnosis: Hip fracture (HCC) [S72.009A]  Procedure(s) (LRB):  HIP HEMIARTHROPLASTY ON LEFT (Left) 1 Day Post-Op   Precautions: posterior hip replacement precautions,WBAT     In place during session: Nasal Cannula 2L and Foley Catheter    ASSESSMENT  Pt is a 71 y.o. male admitted on 12/17/2021 for fall and FX hip; pt currently being treated for Left hemi arthroplasty  . Pt semi supine  upon PT arrival, agreeable to evaluation. Pt A&O x 4.      Based on the objective data described, the patient presents with generalized weakness, impaired functional mobility, impaired amb, impaired balance, and decreased endurance to activities. (See below for objective details and assist levels). 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 Pt will benefit from continued skilled PT to address above deficits and return to PLOF. Current PT DC recommendation Skilled Nursing Facility once medically appropriate.  Patient was educated with THR precautions.  Current Level of Function Impacting Discharge (mobility/balance): Patient with decreased rom, strength, mobility, balance and endurance     Other factors to consider for discharge: SNF-Patient reported he has been to IRF but it was too much for him.Agreeable to go to less aggressive therapy rehab.      PLAN :  Recommendations and Planned Interventions: bed mobility training, transfer training,  gait training, therapeutic exercises, patient and family training/education, and therapeutic activities      Recommend for staff: Out of bed to chair for meals, Encourage HEP in prep for ADLs/mobility, Frequent repositioning to prevent skin breakdown, and THR precautions    Frequency/Duration: Patient will be followed by physical therapy:  3-5x/week to address goals.    Recommendation for discharge: (in order for the patient to meet his/her long term goals)  Skilled Nursing Facility    This discharge recommendation:  Has been made in collaboration with the attending provider and/or case management    IF patient discharges home will need the following DME: patient owns DME required for discharge         SUBJECTIVE:   Patient stated "I am ok."    OBJECTIVE DATA SUMMARY:   HISTORY:    Past Medical History:   Diagnosis Date    COPD (chronic obstructive pulmonary disease) (HCC)     Depression     Diabetes (HCC)     Emphysema lung (HCC)     Heart disease     Hypercholesterolemia     Parkinson disease (HCC)     Pulmonary embolism (HCC)      Past Surgical History:   Procedure Laterality Date    HX COLONOSCOPY      HX HIP REPLACEMENT Right 01/2016    HX IMPLANTABLE CARDIOVERTER DEFIBRILLATOR      HX PACEMAKER      HX PACEMAKER PLACEMENT         Home Situation  Home Environment: Private residence  # Steps to Enter: 3  One/Two Story  Residence: One story  Living Alone: No  Support Systems: Child(ren)  Patient Expects to be Discharged to:: Home  Current DME Used/Available at Home: Shower chair, Grab bars, Environmental consultant, rolling    EXAMINATION/PRESENTATION/DECISION MAKING:   Critical Behavior:  Neurologic State: Alert  Orientation Level: Oriented X4  Cognition: Follows commands     Hearing:  Auditory  Auditory Impairment: None  Skin:  scar intact with wound vac  Edema:   Range Of Motion:  AROM: Generally decreased, functional                       Strength:    Strength: Generally decreased, functional                    Tone &  Sensation:   Tone: Normal                              Coordination:          Functional Mobility:  Bed Mobility:  Rolling: Minimum assistance  Supine to Sit: Moderate assistance  Sit to Supine: Moderate assistance  Scooting: Moderate assistance  Transfers:  Sit to Stand: Moderate assistance;Assist x2  Stand to Sit: Moderate assistance;Assist x2                       Balance:   Sitting: Intact;With support  Standing: Impaired;Pull to stand;With support  Standing - Static: Fair;Constant support  Standing - Dynamic : Fair;Constant support  Ambulation/Gait Training:                       Left Side Weight Bearing: As tolerated                                         Therapeutic Exercises:   AP.heel slides prior to getting OOB    Functional Measure:  [employer] AM-PACT "6 Clicks"         Basic Mobility Inpatient Short Form  How much difficulty does the patient currently have... Unable A Lot A Little None   1.  Turning over in bed (including adjusting bedclothes, sheets and blankets)?   []  1   [x]  2   []  3   []  4   2.  Sitting down on and standing up from a chair with arms ( e.g., wheelchair, bedside commode, etc.)   []  1   [x]  2   []  3   []  4   3.  Moving from lying on back to sitting on the side of the bed?   []  1   [x]  2   []  3   []  4          How much help from another person does the patient currently need... Total A Lot A Little None   4.  Moving to and from a bed to a chair (including a wheelchair)?   []  1   [x]  2   []  3   []  4   5.  Need to walk in hospital room?   []  1   [x]  2   []  3   []  4   6.  Climbing 3-5 steps with a railing?   []  1   [x]  2   []  3   []  4  2007, Trustees of [address], under license to [place], [place]. All rights reserved     Score:  Initial: 12/24 Most Recent: X (Date: 12/19/2021 )   Interpretation of Tool:  Represents activities that are increasingly more difficult (i.e. Bed mobility, Transfers, Gait).  Score 24 23 22-20 19-15 14-10 9-7 6   Modifier CH CI CJ CK CL CM CN          Physical Therapy Evaluation Charge Determination   History Examination Presentation Decision-Making   LOW Complexity : Zero comorbidities / personal factors that will impact the outcome / POC MEDIUM Complexity : 3 Standardized tests and measures addressing body structure, function, activity limitation and / or participation in recreation  MEDIUM Complexity : Evolving with changing characteristics  Other outcome measures ampac 6  12/24      Based on the above components, the patient evaluation is determined to be of the following complexity level: MEDIUM    Pain Rating:  6-7/10 left hip    Activity Tolerance:   Fair    After treatment patient left in no apparent distress:   Bed locked and in lowest position Supine in bed, Call bell within reach, Bed / chair alarm activated, and Side rails x 3 and board updated.    GOALS:    Problem: Mobility Impaired (Adult and Pediatric)  Goal: *Acute Goals and Plan of Care (Insert Text)  Description: FUNCTIONAL STATUS PRIOR TO ADMISSION: Patient required minimal assistance for basic and instrumental ADLs.    HOME SUPPORT PRIOR TO ADMISSION: The patient lived with son and required minimal assistance/contact guard assist for ADLs.    Patient stated goal: get better  Patient will move from supine to sit and sit to supine , scoot up and down, and roll side to side in bed with minimal assistance/contact guard assist within 7 day(s).    Patient will transfer from bed to chair and chair to bed with minimal assistance/contact guard assist using the least restrictive device within 7 day(s).    Patient will improve static standing balance to minimal assistance within 1 week(s).  Patient will ambulate 30 feet with minimal assistance with least restrictive device within 1 weeks.   WBAT LLE  12/19/2021 1228 by Elyse Jarvis R  Outcome: Progressing Towards Goal       COMMUNICATION/EDUCATION:   The patient's plan of care was discussed with: Occupational therapist, Registered nurse, and Case  management.     Fall prevention education was provided and the patient/caregiver indicated understanding., Patient/family have participated as able in goal setting and plan of care., and Patient/family agree to work toward stated goals and plan of care.         Thank you for this referral.  Hettie Holstein, PT   Time Calculation: 35 mins

## 2021-12-19 NOTE — Progress Notes (Signed)
OCCUPATIONAL THERAPY EVALUATION  Patient: Carlos Petersen (71 y.o. male)  Date: 12/19/2021  Primary Diagnosis: Hip fracture ([place]) [S72.009A]  Procedure(s) (LRB):  HIP HEMIARTHROPLASTY ON LEFT (Left) 1 Day Post-Op   Precautions: WBAT LLE, posterior hip precautions, Ax2     In place during session: Peripheral IV and EKG/telemetry     ASSESSMENT  Pt is a 71 y.o. male presenting to [facility] with c/o GLF, admitted 12/17/21 and currently being treated for acute mildly displaced subcapital fracture of L femoral neck, heart disease, HTN, hyperlipidemia, DM II, COPD, Parkinson's disease, hx PE, and chronic anemia. Pt currently s/p L hemiarthroplasty completed 12/18/21 by Dr. Salli Quarry. Per medical chart, pt is WBAT on the LLE. See below for home and PLOF information. Pt received semi-supine in bed upon arrival, AXO x4, and agreeable to OT evaluation.     Based on current observations, pt presents with deficits in generalized strength/AROM, bed mobility, static/dynamic standing balance (see PT note for gait details), functional activity tolerance, coordination, and pain currently impacting overall performance of ADLs and functional transfers/mobility (see below for objective details and assist levels). Overall, pt tolerates session fair with pt completing bed mobility and sit<>stand transfers using RW with mod Ax1-2. VC req'd to push from/reach back for bed during transfers with pt demo'ing understanding. Pt reported dizziness with standing so pt returned sitting and BP checked (78/43). Further activity deferred, pt returned supine and BP rechecked (99/54). Total A req'd for LB dressing at EOB. Pt educated on long handled AD (sock aid, reacher, long handled sponge) to increase IND with ADLs while maintaining hip precautions. Pt verbalized understanding. Drink>mouth completed IND. Pt educated on recommendation for rehab with pt reporting he prefers a SNF as 3 hours of therapy/day at IRF was too much for him in the past. Therapists  verbalized understanding. Pt would benefit from continued skilled OT services to address current impairments and improve IND and safety with self cares and functional transfers/mobility. Current OT d/c recommendation [facility] once medically appropriate.    Other factors to consider for discharge: family/social support, DME, time since onset, severity of deficits, functional baseline     Patient will benefit from skilled therapy intervention to address the above noted impairments.       PLAN :  Recommendations and Planned Interventions: self care training, functional mobility training, therapeutic exercise, balance training, therapeutic activities, endurance activities, patient education, home safety training, and family training/education    Recommend with staff: Encourage HEP in prep for ADLs/mobility, Frequent repositioning to prevent skin breakdown, Use of bed/chair alarm for safety, and LE elevation for management of edema    Recommend next session: Toileting    Frequency/Duration: Patient will be followed by occupational therapy:  3-5x/week to address goals.    Recommendation for discharge: (in order for the patient to meet his/her long term goals)  [facility]    This discharge recommendation:  Has been made in collaboration with the attending provider and/or case management    IF patient discharges home will need the following DME: TBD       SUBJECTIVE:   Patient stated "I went to rehab before and they worked me too hard there."    OBJECTIVE DATA SUMMARY:   HISTORY:   Past Medical History:   Diagnosis Date    COPD (chronic obstructive pulmonary disease) ([place])     Depression     Diabetes ([place])     Emphysema lung ([facility])     Heart disease  Hypercholesterolemia     Parkinson disease ([place])     Pulmonary embolism (HCC)      Past Surgical History:   Procedure Laterality Date    HX COLONOSCOPY      HX HIP REPLACEMENT Right 01/2016    HX IMPLANTABLE CARDIOVERTER DEFIBRILLATOR      HX  PACEMAKER      HX PACEMAKER PLACEMENT         Per pt:   Home Situation  Home Environment: Private residence  # Steps to Enter: 3  One/Two Story Residence: One story  Living Alone: No  Support Systems: Child(ren)  Patient Expects to be Discharged to:: Home  Current DME Used/Available at Home: Civil engineer, contracting, Museum/gallery curator bars, Environmental consultant, rolling  Tub or Shower Type: Tub/Shower combination      Hand dominance: Right    EXAMINATION OF PERFORMANCE DEFICITS:  Cognitive/Behavioral Status:  Neurologic State: Alert  Orientation Level: Oriented X4  Cognition: Follows commands    Hearing:  Auditory  Auditory Impairment: None    Range of Motion:  AROM: Generally decreased, functional    Strength:  Strength: Generally decreased, functional    Coordination:  Fine Motor Skills-Upper: Left Intact;Right Intact    Gross Motor Skills-Upper: Left Intact;Right Intact    Tone & Sensation:  Tone: Normal    Balance:  Sitting: Intact;With support  Standing: Impaired;Pull to stand;With support  Standing - Static: Fair;Constant support  Standing - Dynamic : Fair;Constant support    Functional Mobility and Transfers for ADLs:  Bed Mobility:  Rolling: Minimum assistance  Supine to Sit: Moderate assistance  Sit to Supine: Moderate assistance  Scooting: Moderate assistance    Transfers:  Sit to Stand: Moderate assistance;Assist x2  Stand to Sit: Moderate assistance;Assist x2      ADL Intervention and task modifications:  Lower Body Dressing Assistance  Socks: Total assistance (dependent)  Position Performed: Seated edge of bed    Therapeutic Exercise:  Pt would benefit from UE HEP in prep for ADLs and functional mobility/transfers.     Functional Measure:    KB Home	[place] AM-PACTM "6 Clicks"                                                       Daily Activity Inpatient Short Form  How much help from another person does the patient currently need... Total; A Lot A Little None   1.  Putting on and taking off regular lower body clothing? _0   1 _1   2 _2   3  _3   4   2.  Bathing (including washing, rinsing, drying)? _4   1 _5   2 _6   3 _7   4   3.  Toileting, which includes using toilet, bedpan or urinal? _8  1 _9   2 _10   3 _11   4   4.  Putting on and taking off regular upper body clothing? _12   1 _13   2 _14   3 _15   4   5.  Taking care of personal grooming such as brushing teeth? _16   1 _17   2 _18   3 _19   4   6.  Eating meals? _20   1 _21   2 _22   3 _23   4    2007, Trustees of [employer], under license to [employer]. All rights reserved     Score: 12/24  Interpretation of Tool:  Represents clinically-significant functional categories (i.e. Activities of daily living).  Percentage of Impairment CH    0%   CI    1-19% CJ    20-39% CK    40-59% CL    60-79% CM    80-99% CN     100%   AMPAC  Score 6-_0 20-22 15-19 10-14 7-9 6     Occupational Therapy Evaluation Charge Determination   History Examination Decision-Making   LOW Complexity : Brief history review  MEDIUM Complexity : 3-5 performance deficits relating to physical, cognitive , or psychosocial skils that result in activity limitations and / or participation restrictions LOW Complexity : No comorbidities that affect functional and no verbal or physical assistance needed to complete eval tasks       Based on the above components, the patient evaluation is determined to be of the following complexity level: LOW     Pain Rating:  6/10 LLE    Activity Tolerance:   Poor, requires rest breaks, and signs and symptoms of orthostatic hypotension    After treatment patient left in no apparent distress:    Supine in bed, Call bell within reach, Bed / chair alarm activated, and Side rails x 3, bed locked and in lowest position    COMMUNICATION/EDUCATION:   The patient's plan of care was discussed with: Physical therapist and Registered nurse.     The supervising occupational therapist and treating occupational therapist assistant have met to review this patient's progress and plan of care.    Patient/family have participated  as able in goal setting and plan of care. and Patient/family agree to work toward stated goals and plan of care.    This patient's plan of care is appropriate for delegation to OTA.     PT/OT sessions occurred together for increased safety of pt and clinician.    Thank you for this referral.  Ambrose Mantle, OT  Time Calculation: 36 mins    Problem: Self Care Deficits Care Plan (Adult)  Goal: *Acute Goals and Plan of Care (Insert Text)  Description:   FUNCTIONAL STATUS PRIOR TO ADMISSION: Patient required minimal assistance for basic and instrumental ADLs.     HOME SUPPORT: The patient lived with his son and required minimal assistance/contact guard assist for ADLs.    Occupational Therapy Goals  Initiated 12/19/2021  Patient Goal: Go to rehab and be able to walk to the bathroom.  1.  Patient will perform lower body dressing with modified independence within 7 day(s).  2.  Patient will perform grooming with modified independence within 7 day(s).  3.  Patient will perform bathing with modified independence within 7 day(s).  4.  Patient will perform toilet transfers with modified independence within 7 day(s).  5.  Patient will perform all aspects of toileting with modified independence within 7 day(s).  6.  Patient will participate in upper extremity therapeutic exercise/activities with independence within 7 day(s).    7.  Patient will utilize energy conservation techniques during functional activities with verbal cues within 7 day(s).  Outcome: Not Met

## 2021-12-19 NOTE — Progress Notes (Signed)
Progress Notes by Doreene Adas, MD at 12/19/21 1610                Author: Doreene Adas, MD  Service: Cardiology  Author Type: Physician       Filed: 12/19/21 1614  Date of Service: 12/19/21 1610  Status: Signed          Editor: Doreene Adas, MD (Physician)                    CARDIOLOGY PROGESS NOTE         Carlos Petersen is a 71 y.o. year-old male with past medical history significant for PE, COPD, HLD, Parkinson's, and DM who was evaluated on 4/20 for preop cardiac evaluation prior to Left hip surgery due to femoral neck fracture from fall.      Patient seen and examined,. He underwent left hemiarthroplasty yesterday, uncomplicated. He is lying in bed, no acute distress, on room air. No report of chest pain.       Records from hospital admission course thus far reviewed.       Telemetry reviewed.       INPATIENT MEDICATIONS:  Home medications reviewed.     Current Facility-Administered Medications:    ?  oxyCODONE IR (ROXICODONE) tablet 5 mg, 5 mg, Oral, Q4H PRN, Young, Chevin L, PA-C   ?  0.9% sodium chloride infusion, 50 mL/hr, IntraVENous, CONTINUOUS, Young, Chevin L, PA-C, Last Rate: 50 mL/hr at 12/18/21 0845, 50 mL/hr  at 12/18/21 0845   ?  0.9% sodium chloride infusion 250 mL, 250 mL, IntraVENous, PRN, Hope, Karrie Doffing., MD   ?  0.9% sodium chloride infusion, 100 mL/hr, IntraVENous, CONTINUOUS, Benaissa, Rafik, MD, Last Rate: 100 mL/hr at 12/19/21 0623, 100 mL/hr  at 12/19/21 8115   ?  sodium chloride (NS) flush 5-40 mL, 5-40 mL, IntraVENous, Q8H, Benaissa, Rafik, MD, 10 mL at 12/19/21 1331   ?  acetaminophen (TYLENOL) tablet 1,000 mg, 1,000 mg, Oral, Q6H, Benaissa, Rafik, MD, 1,000 mg at 12/19/21 1204   ?  oxyCODONE IR (ROXICODONE) tablet 5 mg, 5 mg, Oral, Q4H PRN, Polo Riley, Rafik, MD   ?  oxyCODONE IR (ROXICODONE) tablet 10 mg, 10 mg, Oral, Q4H PRN, Polo Riley, Rafik, MD, 10 mg at 12/19/21 0959   ?  naloxone (NARCAN) injection 0.4 mg, 0.4 mg, IntraVENous, PRN, Polo Riley, Rafik, MD   ?   ondansetron (ZOFRAN) injection 4 mg, 4 mg, IntraVENous, Q6H PRN, Benaissa, Rafik, MD   ?  senna-docusate (PERICOLACE) 8.6-50 mg per tablet 1 Tablet, 1 Tablet, Oral, BID, Smith Mince, MD, 1 Tablet at 12/19/21 0809   ?  ceFAZolin (ANCEF) 2 g in sterile water (preservative free) 20 mL IV syringe, 2 g, IntraVENous, Q8H, Benaissa, Rafik, MD, 2 g at 12/19/21  1203   ?  enoxaparin (LOVENOX) injection 40 mg, 40 mg, SubCUTAneous, DAILY, Benaissa, Rafik, MD, 40 mg at 12/19/21 0810   ?  0.9% sodium chloride infusion 250 mL, 250 mL, IntraVENous, PRN, Tegegn, Nahom, MD   ?  albuterol (PROVENTIL HFA, VENTOLIN HFA, PROAIR HFA) inhaler 2 Puff, 2 Puff, Inhalation, Q6H PRN, Bishop, Krysten, PA-C   ?  amiodarone (CORDARONE) tablet 200 mg, 200 mg, Oral, DAILY, Bishop, Krysten, PA-C, 200 mg at 12/19/21 0809   ?  carbidopa-levodopa ER (SINEMET CR) 25-100 mg per tablet 4 Tablet, 4 Tablet, Oral, TID, Bishop, Krysten, PA-C, 4 Tablet at 12/19/21 0809   ?  carvediloL (COREG) tablet 6.25 mg, 6.25 mg, Oral, BID, Bishop,  Krysten, PA-C, 6.25 mg at 12/19/21 9562   ?  empagliflozin (JARDIANCE) tablet 10 mg, 10 mg, Oral, DAILY, Bishop, Krysten, PA-C, 10 mg at 12/19/21 0809   ?  budesonide-formoteroL (SYMBICORT) 160-4.5 mcg/actuation HFA inhaler 1 Puff, 1 Puff, Inhalation, BID RT, 1 Puff at 12/19/21 0801 **AND**  tiotropium bromide (SPIRIVA RESPIMAT) 2.5 mcg /actuation, 2 Puff, Inhalation, DAILY, Bishop, [place], PA-C, 2 Puff at 12/19/21 0810   ?  gabapentin (NEURONTIN) capsule 100 mg, 100 mg, Oral, BID, Bishop, Krysten, PA-C, 100 mg at 12/19/21 0809   ?  pantoprazole (PROTONIX) tablet 40 mg, 40 mg, Oral, DAILY, Bishop, Krysten, PA-C, 40 mg at 12/19/21 0809   ?  ranolazine ER (RANEXA) tablet 500 mg, 500 mg, Oral, BID, Bishop, Krysten, PA-C, 500 mg at 12/19/21 0809   ?  rosuvastatin (CRESTOR) tablet 20 mg, 20 mg, Oral, QHS, Bishop, Krysten, PA-C, 20 mg at 12/18/21 2315   ?  vortioxetine (TRINTELLIX) tablet 20 mg, 20 mg, Oral, DAILY, Bishop,  Krysten, PA-C   ?  sodium chloride (NS) flush 5-40 mL, 5-40 mL, IntraVENous, PRN, Bishop, Krysten, PA-C   ?  acetaminophen (TYLENOL) tablet 650 mg, 650 mg, Oral, Q6H PRN **OR** [DISCONTINUED] acetaminophen (TYLENOL) suppository 650 mg, 650 mg,  Rectal, Q6H PRN, Bishop, Krysten, PA-C   ?  polyethylene glycol (MIRALAX) packet 17 g, 17 g, Oral, DAILY PRN, Bishop, Krysten, PA-C   ?  ondansetron (ZOFRAN ODT) tablet 4 mg, 4 mg, Oral, Q8H PRN **OR** ondansetron (ZOFRAN) injection 4 mg, 4 mg, IntraVENous, Q6H PRN, Bishop,  Krysten, PA-C   ?  glucose chewable tablet 16 g, 4 Tablet, Oral, PRN, Bishop, Krysten, PA-C   ?  glucagon (GLUCAGEN) injection 1 mg, 1 mg, IntraMUSCular, PRN, Bishop, Krysten, PA-C   ?  dextrose 10% infusion 0-250 mL, 0-250 mL, IntraVENous, PRN, Bishop, Krysten, PA-C   ?  insulin lispro (HUMALOG) injection, , SubCUTAneous, AC&HS, Bishop, Krysten, PA-C   ?  celecoxib (CELEBREX) capsule 200 mg, 200 mg, Oral, BID, Young, Chevin L, PA-C, 200 mg at 12/19/21 0809       ALLERGIES:  Allergies reviewed with the patient,No Known Allergies .      REVIEW OF SYSTEMS:  Complete review of systems performed, pertinents noted above, all other systems are negative.      Physical examination :   No apparent distress.   Heart is regular, rate and rhythm. Normal S1, S2, no murmurs are appreciated.   Lungs are clear bilaterally.   Abdomen is soft, nontender, normal bowel sounds.   Extremities have no edema.      Visit Vitals      BP  (!) 96/45 (BP 1 Location: Right upper arm)        Pulse  69     Temp  97.9 F (36.6 C)     Resp  20     Ht  5\' 11"  (1.803 m)     Wt  104.3 kg (230 lb)     SpO2  93%        BMI  32.08 kg/m           Recent labs results and imaging reviewed.       Impression.   1.  POD#1 left hip hemiarthroplasty, uncomplicated.    2.  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 Recent echo with EF 55%  Continue amiodarone.    3.  PE: Hx of, Eliquis on hold due to pending surgery. Resume when cleared by Ortho.    4.  COPD: Primary  following.       Please do not hesitate to call me if additional questions arise.      Alvina Chou, MD   12/19/2021

## 2021-12-19 NOTE — Op Note (Signed)
Operative report  Date of surgery December 18, 2021  Preoperative diagnosis is displaced fracture left femoral neck  Postop diagnosis same  Procedure is placement of an uncemented left hip hemiarthroplasty  Surgeon Dr. Smith Mince  Anesthesia spinal anesthesia by Dr. Mallie Snooks  Estimated blood loss 150 cc  Complications none  Procedure note  Anesthesia was established by the anesthesia team.  The patient was then placed in the right lateral decubitus position on the operating table.  The abdomen left hip and left lower limb were prepped and draped in the usual fashion.  We then proceeded with an incision overlying the greater trochanteric region curving proximally and posteriorly.  Dissection was carried down subcutaneous tissue down to the iliotibial band.  Hemostasis was obtained.  The iliotibial band was divided with a curved Mayo scissors.  The distal third insertion of the gluteus medius on the greater trochanter was divided with a coagulation cautery.  The capsule was divided and excised in a T fashion.  Hemostasis was obtained.  The femoral neck was osteotomized in situ with an oscillating saw.  The femoral head was extracted and measured 59 mm.  We used a just a hand reamer and increasing sizes broaches.  Good press-fit was obtained.  We used short neck and a 59 mm head and this was relocated into the acetabular cavity.  We noticed good press-fit and stable prosthesis in no direction.  This was then removed.  The wound was washed copiously with dilute Betadine.  The femoral component was pushed down the femoral canal and again we noticed excellent press-fit.  The neck and the head was placed and the prosthesis relocated into the acetabular cavity.  Again we noticed excellent stability in every direction.  We reapproximated the gluteus medius with FiberWire suture.  We placed a hemostatic powder.  The iliotibial band was reapproximated with a running and interrupted 1-0 Vicryl suture.  The subcutaneous  layer was closed with interrupted 0 Vicryl also used 2-0 Vicryl and the subcuticular layer we also used staples.  Negative pressure dressing was applied to the wound.  The patient tolerated the procedure well and was taken back to the recovery room in good condition.  The estimated blood loss was 150 cc.  The sponge and needle count was correct at the end of the procedure.

## 2021-12-19 NOTE — Progress Notes (Signed)
Patient has been doing well.  He has been stable.  The patient says the pain is very well controlled in the left hip area.  On examination the patient has calf tenderness on the left.  No calf tenderness on the right.  Neurovascular intact both feet.  No drainage from the negative pressure dressing.  Plan is ultrasound both lower limbs tomorrow to rule out a DVT.  Full weightbearing both lower limbs with a walker and help with physical therapy.  Keep the wound covered at all times.  Do not wet the wound.  Follow-up 2 weeks at the orthopedic clinic.  DVT prophylaxis per the attending.

## 2021-12-19 NOTE — Progress Notes (Signed)
Progress  Notes by Cyndia Diver, RN at 12/19/21 7793                Author: Cyndia Diver, RN  Service: --  Author Type: Registered Nurse       Filed: 12/19/21 0730  Date of Service: 12/19/21 0726  Status: Signed          Editor: Cyndia Diver, RN (Registered Nurse)               Bedside, Verbal, and Written shift change report given to Ardine Eng    (oncoming nurse) by Ronald Lobo    (offgoing nurse). Report included the following information SBAR.

## 2021-12-19 NOTE — Progress Notes (Signed)
Progress Notes by Ardean Larsen, PA-C at 12/19/21 4098                Author: Ardean Larsen, PA-C  Service: Hospitalist  Author Type: Physician Assistant       Filed: 12/19/21 0728  Date of Service: 12/19/21 0726  Status: Signed           Editor: Ardean Larsen, PA-C (Physician Assistant)  Cosigner: Dayle Points, MD at 12/19/21 1731                Hospitalist Progress Note        Subjective:     Daily Progress Note: 12/19/2021 7:23 AM      Hospital Course: JAMORION GOMILLION is a 71 y.o. male past medical history of COPD, diabetes, heart disease, hypertension, hyperlipidemia, Parkinson's  disease, pulmonary embolism on anticoagulation that presented to the ED on 12/17/2021 after sustaining ground-level fall 2 days prior.  In the ED x-ray of the femur and pelvis showed an acute mildly displaced subcapital fracture of the left femoral neck.   Laboratory data showed a hemoglobin 8.1.  Vital signs stable.  Requested admission for ORIF of the left hip. Ortho consulted. Plan for left ORIF on 12/18/2021. Cardiology consulted for clearance. ECHO shows EF 60-65%.  Patient's hemoglobin is 4.3.   Not sure of the accuracy of the results however he was  was transfused 1 unit packed red blood cells.  There is only 200 cc blood loss per operative note.  Iron studies pending         Subjective: Says that he does have some left leg soreness but the pain is better.        Current Facility-Administered Medications          Medication  Dose  Route  Frequency           ?  oxyCODONE IR (ROXICODONE) tablet 5 mg   5 mg  Oral  Q4H PRN     ?  0.9% sodium chloride infusion   50 mL/hr  IntraVENous  CONTINUOUS     ?  0.9% sodium chloride infusion 250 mL   250 mL  IntraVENous  PRN     ?  0.9% sodium chloride infusion   100 mL/hr  IntraVENous  CONTINUOUS     ?  sodium chloride (NS) flush 5-40 mL   5-40 mL  IntraVENous  Q8H     ?  acetaminophen (TYLENOL) tablet 1,000 mg   1,000 mg  Oral  Q6H     ?  oxyCODONE IR (ROXICODONE) tablet 5  mg   5 mg  Oral  Q4H PRN     ?  oxyCODONE IR (ROXICODONE) tablet 10 mg   10 mg  Oral  Q4H PRN     ?  naloxone (NARCAN) injection 0.4 mg   0.4 mg  IntraVENous  PRN     ?  ondansetron (ZOFRAN) injection 4 mg   4 mg  IntraVENous  Q6H PRN           ?  senna-docusate (PERICOLACE) 8.6-50 mg per tablet 1 Tablet   1 Tablet  Oral  BID           ?  ceFAZolin (ANCEF) 2 g in sterile water (preservative free) 20 mL IV syringe   2 g  IntraVENous  Q8H     ?  enoxaparin (LOVENOX) injection 40 mg   40 mg  SubCUTAneous  DAILY     ?  0.9% sodium chloride infusion 250 mL   250 mL  IntraVENous  PRN     ?  albuterol (PROVENTIL HFA, VENTOLIN HFA, PROAIR HFA) inhaler 2 Puff   2 Puff  Inhalation  Q6H PRN     ?  amiodarone (CORDARONE) tablet 200 mg   200 mg  Oral  DAILY     ?  carbidopa-levodopa ER (SINEMET CR) 25-100 mg per tablet 4 Tablet   4 Tablet  Oral  TID     ?  carvediloL (COREG) tablet 6.25 mg   6.25 mg  Oral  BID     ?  empagliflozin (JARDIANCE) tablet 10 mg   10 mg  Oral  DAILY     ?  budesonide-formoteroL (SYMBICORT) 160-4.5 mcg/actuation HFA inhaler 1 Puff   1 Puff  Inhalation  BID RT          And           ?  tiotropium bromide (SPIRIVA RESPIMAT) 2.5 mcg /actuation   2 Puff  Inhalation  DAILY     ?  gabapentin (NEURONTIN) capsule 100 mg   100 mg  Oral  BID     ?  pantoprazole (PROTONIX) tablet 40 mg   40 mg  Oral  DAILY     ?  ranolazine ER (RANEXA) tablet 500 mg   500 mg  Oral  BID           ?  rosuvastatin (CRESTOR) tablet 20 mg   20 mg  Oral  QHS           ?  vortioxetine (TRINTELLIX) tablet 20 mg   20 mg  Oral  DAILY     ?  sodium chloride (NS) flush 5-40 mL   5-40 mL  IntraVENous  PRN     ?  acetaminophen (TYLENOL) tablet 650 mg   650 mg  Oral  Q6H PRN     ?  polyethylene glycol (MIRALAX) packet 17 g   17 g  Oral  DAILY PRN     ?  ondansetron (ZOFRAN ODT) tablet 4 mg   4 mg  Oral  Q8H PRN          Or           ?  ondansetron (ZOFRAN) injection 4 mg   4 mg  IntraVENous  Q6H PRN     ?  glucose chewable tablet 16 g   4 Tablet   Oral  PRN     ?  glucagon (GLUCAGEN) injection 1 mg   1 mg  IntraMUSCular  PRN     ?  dextrose 10% infusion 0-250 mL   0-250 mL  IntraVENous  PRN     ?  insulin lispro (HUMALOG) injection     SubCUTAneous  AC&HS     ?  celecoxib (CELEBREX) capsule 200 mg   200 mg  Oral  BID           ?  morphine injection 1 mg   1 mg  IntraVENous  Q6H PRN            Review of Systems   Constitutional: No fevers, No chills, No sweats, No fatigue, + Weakness   Eyes: No redness   Ears, nose, mouth, throat, and face: No nasal congestion, No sore throat, No voice change   Respiratory: No Shortness of Breath, No cough, No wheezing   Cardiovascular: No chest pain, No palpitations, No extremity edema   Gastrointestinal: No  nausea, No vomiting, No diarrhea, No abdominal pain   Genitourinary: No frequency, No dysuria, No hematuria   Integument/breast: No skin lesion(s)    Neurological: No Confusion, No headaches, No dizziness           Objective:        Visit Vitals      BP  (!) 100/56 (BP 1 Location: Right upper arm, BP Patient Position: At rest)     Pulse  74     Temp  97.9 F (36.6 C)     Resp  19     Ht  '5\' 11"'  (1.803 m)     Wt  104.3 kg (230 lb)     SpO2  96%        BMI  32.08 kg/m      O2 Flow Rate (L/min): 1 l/min O2 Device: None (Room air)      Temp (24hrs), Avg:97.7 F (36.5 C), Min:97.5 F (36.4 C), Max:98.4 F (36.9 C)         No intake/output data recorded.   04/20 1901 - 04/22 0700   In: 2841.3 [P.O.:150; I.V.:2505]   Out: 2425 [Urine:2275]      PHYSICAL EXAM:   Constitutional: No acute distress   Skin: Extremities and face reveal no rashes.    HEENT: Sclerae anicteric. Extra-occular muscles are intact.   Cardiovascular: RRR   Respiratory:  Clear breath sounds bilaterally with no wheezes, rales, or rhonchi.    GI: Abdomen nondistended, soft, and nontender. Normal active bowel sounds.   Musculoskeletal: No pitting edema of the lower legs. Able to move all ext   Neurological:  Patient is alert and oriented. Cranial nerves  II-XII grossly intact   Psychiatric: Mood appears appropriate          Data Review        Recent Results (from the past 24 hour(s))     GLUCOSE, POC          Collection Time: 12/18/21  7:36 AM         Result  Value  Ref Range            Glucose (POC)  109 (H)  65 - 100 mg/dL       Performed by  Rozell Searing RN         GLUCOSE, POC          Collection Time: 12/18/21 11:59 AM         Result  Value  Ref Range            Glucose (POC)  103 (H)  65 - 100 mg/dL       Performed by  Rozell Searing RN         CBC W/O DIFF          Collection Time: 12/18/21  9:16 PM         Result  Value  Ref Range            WBC  5.6  4.1 - 11.1 K/uL       RBC  1.82 (L)  4.10 - 5.70 M/uL       HGB  4.3 (LL)  12.1 - 17.0 g/dL       HCT  14.9 (LL)  36.6 - 50.3 %       MCV  81.9  80.0 - 99.0 FL       MCH  23.6 (L)  26.0 - 34.0 PG  MCHC  28.9 (L)  30.0 - 36.5 g/dL       RDW  17.0 (H)  11.5 - 14.5 %       PLATELET  158  150 - 400 K/uL       MPV  8.6 (L)  8.9 - 12.9 FL       NRBC  0.0  0.0 PER 100 WBC       ABSOLUTE NRBC  0.00  0.00 - 0.01 K/uL       GLUCOSE, POC          Collection Time: 12/18/21  9:42 PM         Result  Value  Ref Range            Glucose (POC)  95  65 - 100 mg/dL       Performed by  April Manson ASHLEY         METABOLIC PANEL, BASIC          Collection Time: 12/19/21  4:22 AM         Result  Value  Ref Range            Sodium  135 (L)  136 - 145 mmol/L       Potassium  3.9  3.5 - 5.1 mmol/L       Chloride  108  97 - 108 mmol/L       CO2  23  21 - 32 mmol/L       Anion gap  4 (L)  5 - 15 mmol/L       Glucose  84  65 - 100 mg/dL       BUN  13  6 - 20 mg/dL       Creatinine  0.58 (L)  0.70 - 1.30 mg/dL       BUN/Creatinine ratio  22 (H)  12 - 20         eGFR  >60  >60 ml/min/1.47m       Calcium  7.5 (L)  8.5 - 10.1 mg/dL       HGB & HCT          Collection Time: 12/19/21  4:22 AM         Result  Value  Ref Range            HGB  7.5 (L)  12.1 - 17.0 g/dL            HCT  24.9 (L)  36.6 - 50.3 %           Radiology review: ECHO       MDM Discussion    Patient with multiple medical comorbidities, each with high likelihood for morbidity and mortality if left untreated.   I have reviewed patient's presenting subjective and objective findings,  as well as all laboratory studies, imaging studies, and vital signs to date as well as treatment rendered and patient's response to those treatments.  In addition, prior medical, surgical and relevant social and family histories were reviewed. I have  discussed management plan with patient/family and with nursing staff.        Assessment:     1.  Acute mildly displaced subcapital fracture of the left femoral neck s/p ORIF #1   2.  Heart disease/hypertension   3.  Hyperlipidemia   4.  Type 2 diabetes   5.  COPD   6.  Parkinson's disease   7.  History of PE  8.  Chronic anemia        Plan:      1.  X-rays of the left hip and pelvis shows left femoral neck fracture.  Orthopedics consulted.  Patient is status post ORIF day #1.  PT OT per surgery.  Plan for surgery in  the a.m.  Tylenol, tramadol, IV morphine as needed for pain.   2.  EKG NSR with 1st degree AV block.  2D echo with  normal EF.  Cardiac telemetry.  Consult cardiology for preop clearance.  Chest x-ray no acute process.  Patient on amiodarone 200 milligrams daily, Coreg 6.25 mg twice daily, Ranexa 500 mg twice  daily.  Continue to monitor blood pressure per unit protocol   3.  On Crestor 20 mg daily at bedtime   4.  Continue with Jardiance 10 mg daily.  Insulin sliding scale.   5.  Oxygen saturation within normal limits on room air.  Chest x-ray pending.  Symbicort/Spiriva.  Albuterol as needed   6.  Continue with Sinemet 3 times a day   7.  We will hold patient's Eliquis.  Lovenox for DVT prophylaxis.  Patient is not hypoxic.  Will resume postop day 1   8.  Hemoglobin of 4.5.  Transfuse 1 unit packed red blood cells.  Today 7.5.  Check iron studies continue to monitor. Patient becomes hemodynamically unstable or hemoglobin drops was for 7  transfuse   9.  CBC BMP in the a.m          Dispo >48 hours. Barriers include post op PT/OT. Suspect will need placement      MDM   Reviewed: previous chart, nursing note and vitals   Reviewed previous: labs   Interpretation: labs and x-ray   Consults: orthopedics and cardiology            CODE STATUS Will remain full code for surgery than change to DNR after       DVT prophylaxis: Hold due anemia   Ulcer prophylaxis: [place] discussed with: Patient/Family, Nurse, and Case Manager      Total time spent with patient: 37 minutes.

## 2021-12-20 ENCOUNTER — Inpatient Hospital Stay: Admit: 2021-12-20 | Payer: MEDICARE | Primary: Internal Medicine

## 2021-12-20 LAB — METABOLIC PANEL, BASIC
Anion gap: 6 mmol/L (ref 5–15)
BUN/Creatinine ratio: 19 (ref 12–20)
BUN: 15 mg/dL (ref 6–20)
CO2: 22 mmol/L (ref 21–32)
Calcium: 7.4 mg/dL — ABNORMAL LOW (ref 8.5–10.1)
Chloride: 108 mmol/L (ref 97–108)
Creatinine: 0.77 mg/dL (ref 0.70–1.30)
Glucose: 103 mg/dL — ABNORMAL HIGH (ref 65–100)
Potassium: 4.2 mmol/L (ref 3.5–5.1)
Sodium: 136 mmol/L (ref 136–145)
eGFR: >60 mL/min/{1.73_m2} (ref >60)

## 2021-12-20 LAB — CBC WITH AUTOMATED DIFF
ABS. BASOPHILS: 0 10*3/uL (ref 0.0–0.1)
ABS. EOSINOPHILS: 0.2 10*3/uL (ref 0.0–0.4)
ABS. IMM. GRANS.: 0.1 10*3/uL — ABNORMAL HIGH (ref 0.00–0.04)
ABS. LYMPHOCYTES: 1.6 10*3/uL (ref 0.8–3.5)
ABS. MONOCYTES: 1 10*3/uL (ref 0.0–1.0)
ABS. NEUTROPHILS: 3.9 10*3/uL (ref 1.8–8.0)
ABSOLUTE NRBC: 0 10*3/uL (ref 0.00–0.01)
BASOPHILS: 0 % (ref 0–1)
EOSINOPHILS: 3 % (ref 0–7)
HCT: 23.8 % — ABNORMAL LOW (ref 36.6–50.3)
HGB: 7.2 g/dL — ABNORMAL LOW (ref 12.1–17.0)
IMMATURE GRANULOCYTES: 1 % — ABNORMAL HIGH (ref 0–0.5)
LYMPHOCYTES: 24 % (ref 12–49)
MCH: 24.3 PG — ABNORMAL LOW (ref 26.0–34.0)
MCHC: 30.3 g/dL (ref 30.0–36.5)
MCV: 80.4 FL (ref 80.0–99.0)
MONOCYTES: 15 % — ABNORMAL HIGH (ref 5–13)
MPV: 9 FL (ref 8.9–12.9)
NEUTROPHILS: 57 % (ref 32–75)
NRBC: 0 PER 100 WBC
PLATELET: 185 10*3/uL (ref 150–400)
RBC: 2.96 M/uL — ABNORMAL LOW (ref 4.10–5.70)
RDW: 17.4 % — ABNORMAL HIGH (ref 11.5–14.5)
WBC: 6.8 10*3/uL (ref 4.1–11.1)

## 2021-12-20 LAB — GLUCOSE, POC
Glucose (POC): 149 mg/dL — ABNORMAL HIGH (ref 65–100)
Glucose (POC): 109 mg/dL — ABNORMAL HIGH (ref 65–100)
Glucose (POC): 127 mg/dL — ABNORMAL HIGH (ref 65–100)
Glucose (POC): 121 mg/dL — ABNORMAL HIGH (ref 65–100)

## 2021-12-20 LAB — CULTURE, URINE
Culture result:: NO GROWTH
Culture: NO GROWTH

## 2021-12-20 LAB — IRON PROFILE
Iron % saturation: 12 % — ABNORMAL LOW (ref 20–50)
Iron: 28 ug/dL — ABNORMAL LOW (ref 35–150)
TIBC: 240 ug/dL — ABNORMAL LOW (ref 250–450)

## 2021-12-20 LAB — CBC WITH AUTO DIFFERENTIAL
Basophils %: 0 % (ref 0–1)
Basophils Absolute: 0 10*3/uL (ref 0.0–0.1)
Eosinophils %: 3 % (ref 0–7)
Eosinophils Absolute: 0.2 10*3/uL (ref 0.0–0.4)
Granulocyte Absolute Count: 0.1 10*3/uL — ABNORMAL HIGH (ref 0.00–0.04)
Hematocrit: 23.8 % — ABNORMAL LOW (ref 36.6–50.3)
Hemoglobin: 7.2 g/dL — ABNORMAL LOW (ref 12.1–17.0)
Immature Granulocytes: 1 % — ABNORMAL HIGH (ref 0–0.5)
Lymphocytes %: 24 % (ref 12–49)
Lymphocytes Absolute: 1.6 10*3/uL (ref 0.8–3.5)
MCH: 24.3 PG — ABNORMAL LOW (ref 26.0–34.0)
MCHC: 30.3 g/dL (ref 30.0–36.5)
MCV: 80.4 FL (ref 80.0–99.0)
MPV: 9 FL (ref 8.9–12.9)
Monocytes %: 15 % — ABNORMAL HIGH (ref 5–13)
Monocytes Absolute: 1 10*3/uL (ref 0.0–1.0)
NRBC Absolute: 0 10*3/uL (ref 0.00–0.01)
Neutrophils %: 57 % (ref 32–75)
Neutrophils Absolute: 3.9 10*3/uL (ref 1.8–8.0)
Nucleated RBCs: 0 PER 100 WBC
Platelets: 185 10*3/uL (ref 150–400)
RBC: 2.96 M/uL — ABNORMAL LOW (ref 4.10–5.70)
RDW: 17.4 % — ABNORMAL HIGH (ref 11.5–14.5)
WBC: 6.8 10*3/uL (ref 4.1–11.1)

## 2021-12-20 LAB — IRON AND TIBC
Iron Saturation: 12 % — ABNORMAL LOW (ref 20–50)
Iron: 28 ug/dL — ABNORMAL LOW (ref 35–150)
TIBC: 240 ug/dL — ABNORMAL LOW (ref 250–450)

## 2021-12-20 LAB — POCT GLUCOSE
POC Glucose: 149 mg/dL — ABNORMAL HIGH (ref 65–100)
POC Glucose: 109 mg/dL — ABNORMAL HIGH (ref 65–100)
POC Glucose: 127 mg/dL — ABNORMAL HIGH (ref 65–100)
POC Glucose: 121 mg/dL — ABNORMAL HIGH (ref 65–100)

## 2021-12-20 LAB — BASIC METABOLIC PANEL
Anion Gap: 6 mmol/L (ref 5–15)
BUN: 15 mg/dL (ref 6–20)
Bun/Cre Ratio: 19 (ref 12–20)
CO2: 22 mmol/L (ref 21–32)
Calcium: 7.4 mg/dL — ABNORMAL LOW (ref 8.5–10.1)
Chloride: 108 mmol/L (ref 97–108)
Creatinine: 0.77 mg/dL (ref 0.70–1.30)
ESTIMATED GLOMERULAR FILTRATION RATE: >60 mL/min/{1.73_m2} (ref >60)
Glucose: 103 mg/dL — ABNORMAL HIGH (ref 65–100)
Potassium: 4.2 mmol/L (ref 3.5–5.1)
Sodium: 136 mmol/L (ref 136–145)

## 2021-12-20 MED FILL — RANOLAZINE SR 500 MG 12 HR TAB: 500 mg | ORAL | Qty: 1

## 2021-12-20 MED FILL — CELECOXIB 200 MG CAP: 200 mg | ORAL | Qty: 1

## 2021-12-20 MED FILL — SYMBICORT 160 MCG-4.5 MCG/ACTUATION HFA AEROSOL INHALER: RESPIRATORY_TRACT | Qty: 6

## 2021-12-20 MED FILL — CARBIDOPA-LEVODOPA ER 25-100 MG TABLET, EXTENDED RELEASE: 25-100 mg | ORAL | Qty: 4

## 2021-12-20 MED FILL — STIMULANT LAXATIVE PLUS 8.6 MG-50 MG TABLET: ORAL | Qty: 1

## 2021-12-20 MED FILL — ACETAMINOPHEN 500 MG TAB: 500 mg | ORAL | Qty: 2

## 2021-12-20 MED FILL — ONDANSETRON (PF) 4 MG/2 ML INJECTION: 4 mg/2 mL | INTRAMUSCULAR | Qty: 2

## 2021-12-20 MED FILL — JARDIANCE 10 MG TABLET: 10 mg | ORAL | Qty: 1

## 2021-12-20 MED FILL — OXYCODONE 5 MG TAB: 5 mg | ORAL | Qty: 2

## 2021-12-20 MED FILL — ROSUVASTATIN 5 MG TAB: 5 mg | ORAL | Qty: 4

## 2021-12-20 MED FILL — CARVEDILOL 3.125 MG TAB: 3.125 mg | ORAL | Qty: 2

## 2021-12-20 MED FILL — GABAPENTIN 100 MG CAP: 100 mg | ORAL | Qty: 1

## 2021-12-20 MED FILL — SPIRIVA RESPIMAT 2.5 MCG/ACTUATION SOLUTION FOR INHALATION: 2.5 mcg/actuation | RESPIRATORY_TRACT | Qty: 8

## 2021-12-20 MED FILL — PANTOPRAZOLE 40 MG TAB, DELAYED RELEASE: 40 mg | ORAL | Qty: 1

## 2021-12-20 MED FILL — OXYCODONE 5 MG TAB: 5 mg | ORAL | Qty: 1

## 2021-12-20 MED FILL — ENOXAPARIN 40 MG/0.4 ML SUB-Q SYRINGE: 40 mg/0.4 mL | SUBCUTANEOUS | Qty: 0.4

## 2021-12-20 MED FILL — AMIODARONE 200 MG TAB: 200 mg | ORAL | Qty: 1

## 2021-12-20 NOTE — Progress Notes (Signed)
Hospitalist Progress Note    Subjective:   Daily Progress Note: 12/20/2021 7:23 AM    Hospital Course: Carlos SosDavid B Petersen is a 71 y.o. male past medical history of COPD, diabetes, heart disease, hypertension, hyperlipidemia, Parkinson's disease, pulmonary embolism on anticoagulation that presented to the ED on 12/17/2021 after sustaining ground-level fall 2 days prior.  In the ED x-ray of the femur and pelvis showed an acute mildly displaced subcapital fracture of the left femoral neck.  Laboratory data showed a hemoglobin 8.1.  Vital signs stable.  Requested admission for ORIF of the left hip. Ortho consulted. Plan for left ORIF on 12/18/2021. Cardiology consulted for clearance. ECHO shows EF 60-65%.  Patient's hemoglobin is 4.3.  Not sure of the accuracy of the results however he was  was transfused 1 unit packed red blood cells.  There is only 200 cc blood loss per operative note.  Iron studies pending      Subjective: Patient says he feels tired this morning. Pain better in leg    Current Facility-Administered Medications   Medication Dose Route Frequency    oxyCODONE IR (ROXICODONE) tablet 5 mg  5 mg Oral Q4H PRN    0.9% sodium chloride infusion  50 mL/hr IntraVENous CONTINUOUS    0.9% sodium chloride infusion 250 mL  250 mL IntraVENous PRN    sodium chloride (NS) flush 5-40 mL  5-40 mL IntraVENous Q8H    acetaminophen (TYLENOL) tablet 1,000 mg  1,000 mg Oral Q6H    oxyCODONE IR (ROXICODONE) tablet 5 mg  5 mg Oral Q4H PRN    oxyCODONE IR (ROXICODONE) tablet 10 mg  10 mg Oral Q4H PRN    naloxone (NARCAN) injection 0.4 mg  0.4 mg IntraVENous PRN    senna-docusate (PERICOLACE) 8.6-50 mg per tablet 1 Tablet  1 Tablet Oral BID    enoxaparin (LOVENOX) injection 40 mg  40 mg SubCUTAneous DAILY    0.9% sodium chloride infusion 250 mL  250 mL IntraVENous PRN    albuterol (PROVENTIL HFA, VENTOLIN HFA, PROAIR HFA) inhaler 2 Puff  2 Puff Inhalation Q6H PRN    amiodarone (CORDARONE) tablet 200 mg  200 mg Oral DAILY     carbidopa-levodopa ER (SINEMET CR) 25-100 mg per tablet 4 Tablet  4 Tablet Oral TID    carvediloL (COREG) tablet 6.25 mg  6.25 mg Oral BID    empagliflozin (JARDIANCE) tablet 10 mg  10 mg Oral DAILY    budesonide-formoteroL (SYMBICORT) 160-4.5 mcg/actuation HFA inhaler 1 Puff  1 Puff Inhalation BID RT    And    tiotropium bromide (SPIRIVA RESPIMAT) 2.5 mcg /actuation  2 Puff Inhalation DAILY    gabapentin (NEURONTIN) capsule 100 mg  100 mg Oral BID    pantoprazole (PROTONIX) tablet 40 mg  40 mg Oral DAILY    ranolazine ER (RANEXA) tablet 500 mg  500 mg Oral BID    rosuvastatin (CRESTOR) tablet 20 mg  20 mg Oral QHS    vortioxetine (TRINTELLIX) tablet 20 mg  20 mg Oral DAILY    sodium chloride (NS) flush 5-40 mL  5-40 mL IntraVENous PRN    acetaminophen (TYLENOL) tablet 650 mg  650 mg Oral Q6H PRN    polyethylene glycol (MIRALAX) packet 17 g  17 g Oral DAILY PRN    ondansetron (ZOFRAN ODT) tablet 4 mg  4 mg Oral Q8H PRN    Or    ondansetron (ZOFRAN) injection 4 mg  4 mg IntraVENous Q6H PRN    glucose chewable  tablet 16 g  4 Tablet Oral PRN    glucagon (GLUCAGEN) injection 1 mg  1 mg IntraMUSCular PRN    dextrose 10% infusion 0-250 mL  0-250 mL IntraVENous PRN    insulin lispro (HUMALOG) injection   SubCUTAneous AC&HS    celecoxib (CELEBREX) capsule 200 mg  200 mg Oral BID        Review of Systems  Constitutional: No fevers, No chills, No sweats, No fatigue, + Weakness  Eyes: No redness  Ears, nose, mouth, throat, and face: No nasal congestion, No sore throat, No voice change  Respiratory: No Shortness of Breath, No cough, No wheezing  Cardiovascular: No chest pain, No palpitations, No extremity edema  Gastrointestinal: No nausea, No vomiting, No diarrhea, No abdominal pain  Genitourinary: No frequency, No dysuria, No hematuria  Integument/breast: No skin lesion(s)   Neurological: No Confusion, No headaches, No dizziness      Objective:     Visit Vitals  BP (!) 112/55 (BP 1 Location: Left upper arm, BP Patient  Position: At rest)   Pulse 73   Temp 97.7 F (36.5 C)   Resp 17   Ht 5\' 11"  (1.803 m)   Wt 89.2 kg (196 lb 10.4 oz)   SpO2 96%   BMI 27.43 kg/m    O2 Flow Rate (L/min): 1 l/min O2 Device: None (Room air)    Temp (24hrs), Avg:97.9 F (36.6 C), Min:97.7 F (36.5 C), Max:98.1 F (36.7 C)      No intake/output data recorded.  04/21 1901 - 04/23 0700  In: 2841.3 [P.O.:150; I.V.:2505]  Out: 1650 [Urine:1500]    PHYSICAL EXAM:  Constitutional: No acute distress  Skin: Extremities and face reveal no rashes.   HEENT: Sclerae anicteric. Extra-occular muscles are intact.  Cardiovascular: RRR  Respiratory: nonlabored  GI: Abdomen nondistended, soft, and nontender. Normal active bowel sounds.  Musculoskeletal: No pitting edema of the lower legs. Able to move all ext  Neurological:  Patient is alert and oriented. Cranial nerves II-XII grossly intact  Psychiatric: Mood appears appropriate       Data Review    Recent Results (from the past 24 hour(s))   GLUCOSE, POC    Collection Time: 12/19/21  8:12 AM   Result Value Ref Range    Glucose (POC) 96 65 - 100 mg/dL    Performed by 12/21/21    GLUCOSE, POC    Collection Time: 12/19/21 12:13 PM   Result Value Ref Range    Glucose (POC) 125 (H) 65 - 100 mg/dL    Performed by 12/21/21    GLUCOSE, POC    Collection Time: 12/19/21  5:44 PM   Result Value Ref Range    Glucose (POC) 136 (H) 65 - 100 mg/dL    Performed by 12/21/21    GLUCOSE, POC    Collection Time: 12/19/21  9:55 PM   Result Value Ref Range    Glucose (POC) 149 (H) 65 - 100 mg/dL    Performed by 12/21/21 [facility])        Radiology review: ECHO    MDM Discussion   Patient with multiple medical comorbidities, each with high likelihood for morbidity and mortality if left untreated.   I have reviewed patient's presenting subjective and objective findings, as well as all laboratory studies, imaging studies, and vital signs to date as well as treatment rendered and patient's response to those treatments.  In  addition, prior medical, surgical and relevant social and family  histories were reviewed. I have discussed management plan with patient/family and with nursing staff.    Assessment:   1.  Acute mildly displaced subcapital fracture of the left femoral neck s/p ORIF #2  2.  Heart disease/hypertension  3.  Hyperlipidemia  4.  Type 2 diabetes  5.  COPD  6.  Parkinson's disease  7.  History of PE  8.  Chronic anemia    Plan:    1.  X-rays of the left hip and pelvis shows left femoral neck fracture.  Orthopedics consulted.  Patient is status post ORIF day #2.  PT OT per surgery.  Plan for surgery in the a.m.  Tylenol, tramadol, IV morphine as needed for pain. PT/OT recommend SNF  2.  EKG NSR with 1st degree AV block.  2D echo with  normal EF.  Cardiac telemetry.  Consult cardiology for preop clearance.  Chest x-ray no acute process.  Patient on amiodarone 200mg  daily, Coreg 6.25 mg twice daily, Ranexa 500 mg twice daily.  Continue to monitor blood pressure per unit protocol  3.  On Crestor 20 mg daily at bedtime  4.  Continue with Jardiance 10 mg daily.  Insulin sliding scale.  5.  Oxygen saturation within normal limits on room air.  Chest x-ray pending.  Symbicort/Spiriva.  Albuterol as needed  6.  Continue with Sinemet 3 times a day  7.  We will hold patient's Eliquis.  Lovenox for DVT prophylaxis.  Patient is not hypoxic.  Will resume when HGB stablizes  8.  Hemoglobin of 4.5.  Transfuse 1 unit packed red blood cells.  Pending for today. Iron studies pending. Patient becomes hemodynamically unstable or hemoglobin drops was for 7 transfuse  9.  CBC BMP in the a.m       Dispo 24-48 hours. Barriers include stabilization of HGB, ortho clearance, and placement SNF.    MDM  Reviewed: previous chart, nursing note and vitals  Reviewed previous: labs  Interpretation: labs and x-ray  Consults: orthopedics and cardiology        CODE STATUS Will remain full code for surgery than change to DNR after     DVT prophylaxis: Hold due  anemia  Ulcer prophylaxis: Prilosec    Care Plan discussed with: Patient/Family, Nurse, and Case Manager    Total time spent with patient: 36 minutes.

## 2021-12-20 NOTE — Progress Notes (Signed)
Problem: Pain  Goal: *Control of Pain  Outcome: Progressing Towards Goal     Problem: Patient Education: Go to Patient Education Activity  Goal: Patient/Family Education  Outcome: Progressing Towards Goal     Problem: Discharge Planning  Goal: *Discharge to safe environment  Outcome: Progressing Towards Goal     Problem: Falls - Risk of  Goal: *Absence of Falls  Description: Document Bridgette Habermann Fall Risk and appropriate interventions in the flowsheet.  Outcome: Progressing Towards Goal  Note: Fall Risk Interventions:            Medication Interventions: Patient to call before getting OOB    Elimination Interventions: Call light in reach, Patient to call for help with toileting needs

## 2021-12-20 NOTE — Progress Notes (Signed)
Problem: Mobility Impaired (Adult and Pediatric)  Goal: *Acute Goals and Plan of Care (Insert Text)  Description: FUNCTIONAL STATUS PRIOR TO ADMISSION: Patient required minimal assistance for basic and instrumental ADLs.    HOME SUPPORT PRIOR TO ADMISSION: The patient lived with son and required minimal assistance/contact guard assist for ADLs.    Patient stated goal: get better  Patient will move from supine to sit and sit to supine , scoot up and down, and roll side to side in bed with minimal assistance/contact guard assist within 7 day(s).    Patient will transfer from bed to chair and chair to bed with minimal assistance/contact guard assist using the least restrictive device within 7 day(s).    Patient will improve static standing balance to minimal assistance within 1 week(s).  Patient will ambulate 30 feet with minimal assistance with least restrictive device within 1 weeks.   WBAT LLE  Outcome: Progressing Towards Goal   PHYSICAL THERAPY TREATMENT  Patient: Carlos Petersen (71 y.o. male)  Date: 12/20/2021  Diagnosis: Hip fracture (HCC) [S72.009A] <principal problem not specified>  Procedure(s) (LRB):  HIP HEMIARTHROPLASTY ON LEFT (Left) 2 Days Post-Op  Precautions:    In place during session: Peripheral IV, Wound vac L hip, and EKG/telemetry   Chart, physical therapy assessment, plan of care and goals were reviewed.    ASSESSMENT  Patient continues with skilled PT services and is progressing towards goals. Pt semi-supine upon PT arrival, agreeable to session. Co-treat with OT due to level of assist needed for bed mobility and OOB transfers for safety of patient/clinicians. (See below for objective details and assist levels). Overall pt tolerated session fair today with transfer to recliner performed, minA x 2 with use of RW. Less assistance required to get to EOB today and improved sitting balance noted. Cues for AD management and sequencing of sit<>stand transfer and to keep walker close. No LOB and  buckling noted in standing. Monitored BP readings throughout session with positional changes and patient asymptomatic; rise in BP once resting in recliner (see below). Will continue to benefit from skilled PT services, and will continue to progress as tolerated.     Current Level of Function Impacting Discharge (mobility/balance): decr strength, decr standing balance, decr endurance     Other factors to consider for discharge: L hip fracture, posterior precautions (reviewed with pt), WBAT, level of assist          PLAN :  Patient continues to benefit from skilled intervention to address the above impairments.  Continue treatment per established plan of care to address goals.    Recommend with staff: Out of bed to chair for meals, Encourage HEP in prep for ADLs/mobility, Amb to bathroom for toileting with gt belt and AD, and Use of bed/chair alarm for safety    Recommendation for discharge: (in order for the patient to meet his/her long term goals)  Skilled Nursing Facility    This discharge recommendation:  Has been made in collaboration with the attending provider and/or case management    IF patient discharges home will need the following DME: rolling walker and to be determined (TBD)       SUBJECTIVE:   Patient stated "I lean a little in standing because I don't like to put weight on the L side."    OBJECTIVE DATA SUMMARY:   Critical Behavior:  Neurologic State: Alert  Orientation Level: Oriented X4  Cognition: Follows commands  Safety/Judgement: Lack of insight into deficits, Decreased awareness of need for  safety  Functional Mobility Training:  Bed Mobility:  Rolling: Stand-by assistance  Supine to Sit: Stand-by assistance;Additional time  Scooting: Stand-by assistance        Transfers:  Sit to Stand: Minimum assistance;Assist x2  Stand to Sit: Minimum assistance;Assist x2  Bed to Chair: Minimum assistance;Assist x2;Additional time    Balance:  Sitting: Intact  Standing: Impaired;With support  Standing -  Static: Constant support;Fair  Standing - Dynamic : Constant support;Fair  Ambulation/Gait Training:  Distance (ft): 2 Feet (ft)  Assistive Device: Gait belt;Walker, rolling  Ambulation - Level of Assistance: Minimal assistance;Assist x2        Gait Abnormalities: Antalgic     Left Side Weight Bearing: As tolerated  Base of Support: Narrowed     Speed/Cadence: Slow;Shuffled    Therapeutic Exercises:       EXERCISE   Sets   Reps   Active Active Assist   Passive Self ROM   Comments   Ankle Pumps   []  []  []  []     Quad Sets/Glut Sets   []  []  []  []     Hamstring Sets   []  []  []  []     Short Arc Quads   []  []  []  []     Heel Slides   []  []  []  []     Straight Leg Raises   []  []  []  []     Hip abd/add   []  []  []  []     Long Arc Quads   []  []  []  []     Marching   []  []  []  []        []  []  []  []        Pain Rating:  5/10 L hip     Activity Tolerance:   Fair, SpO2 stable on RA, requires rest breaks, and observed SOB with activity    After treatment patient left in no apparent distress:   Bed locked and returned to lowest position, Sitting in chair and Call bell within reach    COMMUNICATION/COLLABORATION:   The patient's plan of care was discussed with: Physical therapist, Occupational therapy assistant, and Registered nurse.     BP in supine: 11/59  BP in sitting EOB: 106/48, 70 bpm  BP in standing with RW: 101/48  BP sitting in recliner: 99/44, 89 bpm  BP 3 min after resting in recliner: 117/47, 75 bpm       , PT   Time Calculation: 30 mins

## 2021-12-20 NOTE — Progress Notes (Signed)
Progress Notes by Doreene Adas, MD at 12/20/21 1731                Author: Doreene Adas, MD  Service: Cardiology  Author Type: Physician       Filed: 12/20/21 1732  Date of Service: 12/20/21 1731  Status: Signed          Editor: Doreene Adas, MD (Physician)                    CARDIOLOGY PROGESS NOTE         Carlos Petersen is a 71 y.o. year-old male with past medical history significant for PE, COPD, HLD, Parkinson's, and DM who was evaluated on 4/20 for preop cardiac evaluation prior to Left hip surgery due to femoral neck fracture from fall.      Patient seen and examined,. He underwent left hemiarthroplasty Friday, uncomplicated. He is lying in bed, no acute distress, on room air. No report of chest pain.       Records from hospital admission course thus far reviewed.       Telemetry reviewed.       INPATIENT MEDICATIONS:  Home medications reviewed.     Current Facility-Administered Medications:    ?  oxyCODONE IR (ROXICODONE) tablet 5 mg, 5 mg, Oral, Q4H PRN, Young, Chevin L, PA-C   ?  0.9% sodium chloride infusion, 50 mL/hr, IntraVENous, CONTINUOUS, Young, Chevin L, PA-C, Last Rate: 50 mL/hr at 12/20/21 0447, 50 mL/hr  at 12/20/21 0447   ?  0.9% sodium chloride infusion 250 mL, 250 mL, IntraVENous, PRN, Hope, Karrie Doffing., MD   ?  sodium chloride (NS) flush 5-40 mL, 5-40 mL, IntraVENous, Q8H, Benaissa, Rafik, MD, 10 mL at 12/20/21 1634   ?  acetaminophen (TYLENOL) tablet 1,000 mg, 1,000 mg, Oral, Q6H, Benaissa, Rafik, MD, 1,000 mg at 12/20/21 1630   ?  oxyCODONE IR (ROXICODONE) tablet 5 mg, 5 mg, Oral, Q4H PRN, Smith Mince, MD, 5 mg at 12/19/21 1958   ?  oxyCODONE IR (ROXICODONE) tablet 10 mg, 10 mg, Oral, Q4H PRN, Polo Riley, Rafik, MD, 10 mg at 12/20/21 0840   ?  naloxone (NARCAN) injection 0.4 mg, 0.4 mg, IntraVENous, PRN, Polo Riley, Rafik, MD   ?  senna-docusate (PERICOLACE) 8.6-50 mg per tablet 1 Tablet, 1 Tablet, Oral, BID, Benaissa, Rafik, MD, 1 Tablet at 12/19/21 2157   ?   enoxaparin (LOVENOX) injection 40 mg, 40 mg, SubCUTAneous, DAILY, Benaissa, Rafik, MD, 40 mg at 12/20/21 0843   ?  0.9% sodium chloride infusion 250 mL, 250 mL, IntraVENous, PRN, Tegegn, Nahom, MD   ?  albuterol (PROVENTIL HFA, VENTOLIN HFA, PROAIR HFA) inhaler 2 Puff, 2 Puff, Inhalation, Q6H PRN, Bishop, Krysten, PA-C   ?  amiodarone (CORDARONE) tablet 200 mg, 200 mg, Oral, DAILY, Bishop, Krysten, PA-C, 200 mg at 12/20/21 0840   ?  carbidopa-levodopa ER (SINEMET CR) 25-100 mg per tablet 4 Tablet, 4 Tablet, Oral, TID, Bishop, Krysten, PA-C, 4 Tablet at 12/20/21 1629   ?  carvediloL (COREG) tablet 6.25 mg, 6.25 mg, Oral, BID, Bishop, Krysten, PA-C, 6.25 mg at 12/20/21 0841   ?  empagliflozin (JARDIANCE) tablet 10 mg, 10 mg, Oral, DAILY, Bishop, Krysten, PA-C, 10 mg at 12/20/21 0840   ?  budesonide-formoteroL (SYMBICORT) 160-4.5 mcg/actuation HFA inhaler 1 Puff, 1 Puff, Inhalation, BID RT, 1 Puff at 12/19/21 2030 **AND**  tiotropium bromide (SPIRIVA RESPIMAT) 2.5 mcg /actuation, 2 Puff, Inhalation, DAILY, Bishop, [place], PA-C, 2 Puff at 12/19/21  3662   ?  gabapentin (NEURONTIN) capsule 100 mg, 100 mg, Oral, BID, Bishop, Krysten, PA-C, 100 mg at 12/20/21 0841   ?  pantoprazole (PROTONIX) tablet 40 mg, 40 mg, Oral, DAILY, Bishop, Krysten, PA-C, 40 mg at 12/20/21 0841   ?  ranolazine ER (RANEXA) tablet 500 mg, 500 mg, Oral, BID, Bishop, Krysten, PA-C, 500 mg at 12/20/21 0840   ?  rosuvastatin (CRESTOR) tablet 20 mg, 20 mg, Oral, QHS, Bishop, Krysten, PA-C, 20 mg at 12/19/21 2156   ?  sodium chloride (NS) flush 5-40 mL, 5-40 mL, IntraVENous, PRN, Bishop, Krysten, PA-C   ?  acetaminophen (TYLENOL) tablet 650 mg, 650 mg, Oral, Q6H PRN **OR** [DISCONTINUED] acetaminophen (TYLENOL) suppository 650 mg, 650 mg,  Rectal, Q6H PRN, Bishop, Krysten, PA-C   ?  polyethylene glycol (MIRALAX) packet 17 g, 17 g, Oral, DAILY PRN, Bishop, Krysten, PA-C   ?  ondansetron (ZOFRAN ODT) tablet 4 mg, 4 mg, Oral, Q8H PRN **OR** ondansetron  (ZOFRAN) injection 4 mg, 4 mg, IntraVENous, Q6H PRN, Bishop,  Krysten, PA-C, 4 mg at 12/19/21 2206   ?  glucose chewable tablet 16 g, 4 Tablet, Oral, PRN, Bishop, Krysten, PA-C   ?  glucagon (GLUCAGEN) injection 1 mg, 1 mg, IntraMUSCular, PRN, Bishop, Krysten, PA-C   ?  dextrose 10% infusion 0-250 mL, 0-250 mL, IntraVENous, PRN, Bishop, Krysten, PA-C   ?  insulin lispro (HUMALOG) injection, , SubCUTAneous, AC&HS, Bishop, Krysten, PA-C   ?  celecoxib (CELEBREX) capsule 200 mg, 200 mg, Oral, BID, Young, Chevin L, PA-C, 200 mg at 12/20/21 0841       ALLERGIES:  Allergies reviewed with the patient,No Known Allergies .      REVIEW OF SYSTEMS:  Complete review of systems performed, pertinents noted above, all other systems are negative.      Physical examination :   No apparent distress.   Heart is regular, rate and rhythm. Normal S1, S2, no murmurs are appreciated.   Lungs are clear bilaterally.   Abdomen is soft, nontender, normal bowel sounds.   Extremities have no edema.      Visit Vitals      BP  (!) 105/52 (BP 1 Location: Left upper arm, BP Patient Position: At rest)        Pulse  67     Temp  97.5 F (36.4 C)     Resp  18     Ht  5\' 11"  (1.803 m)     Wt  89.2 kg (196 lb 10.4 oz)     SpO2  96%        BMI  27.43 kg/m           Recent labs results and imaging reviewed.       Impression.   1.  POD#2  left hip hemiarthroplasty, uncomplicated.    2.  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 Recent echo with EF 55%  Continue amiodarone.    3.  PE: Hx of, Eliquis on hold due to pending surgery. Resume when cleared by Ortho.    4.  COPD: Primary following.       Please do not hesitate to call me if additional questions arise.      , MD   12/20/2021

## 2021-12-20 NOTE — Progress Notes (Signed)
Patient tells me he has been doing well.  No pain.  He has been ambulating fine.  On exam of the negative pressure dressing is basically dry.  Some ecchymosis around the wound no sign of any infection.  No calf tenderness bilaterally and he is neurovascular intact in both feet.  Plan is weight-bear as tolerated activity as tolerated the walker and help.  DVT prophylaxis per the attending.  Follow-up with the orthopedic clinic 2 weeks.  Keep the wound covered at all times and do not wet the wound.

## 2021-12-20 NOTE — Progress Notes (Signed)
Progress  Notes by Hal Morales, RN at 12/20/21 1945                Author: Hal Morales, RN  Service: NURSING  Author Type: Registered Nurse       Filed: 12/21/21 0156  Date of Service: 12/20/21 1945  Status: Signed          Editor: Hal Morales, RN (Registered Nurse)                  Problem: Pain   Goal: *Control of Pain   Outcome: Progressing Towards Goal

## 2021-12-20 NOTE — Progress Notes (Signed)
Bedside shift change report given to Gabby, RN (oncoming nurse) by Hanna, RN (offgoing nurse). Report included the following information SBAR, Intake/Output, MAR, and Recent Results.

## 2021-12-20 NOTE — Progress Notes (Unsigned)
OCCUPATIONAL THERAPY TREATMENT  Patient: Carlos Petersen (71 y.o. male)  Date: 12/20/2021  Diagnosis: Hip fracture ([place]) [S72.009A] <principal problem not specified>  Procedure(s) (LRB):  HIP HEMIARTHROPLASTY ON LEFT (Left) 2 Days Post-Op  Precautions: FALL  In place during session: Peripheral IV, Wound vac  , and EKG/telemetry   Chart, occupational therapy assessment, plan of care, and goals were reviewed.    ASSESSMENT  Pt continues with skilled OT services and is progressing towards goals. Pt received semi-supine in bed upon arrival, AXO x4 and agreeable to COTA tx at this time. Pt cooperative and demonstrated good effort during activities. Pt c/o pain in L hip.  Pt demonstrated improved bed mobility>EOB with decreased assistance with vc's for proper technique and vc's for hip precautions as pt unable to recall. Pt presented good sitting balance. As stood from bed, pt had soiled self and req'd TA for toileting hygiene then performed bed>chair tf using RW/gait belt with decreased assistance with vc's for Rw mgmt and sequencing with no LOB. Pt set-set-up for simple grooming in chair.   Overall, pt continues to present with deficits in generalized strength/AROM, static/dynamic standing balance and functional activity tolerance during performance of ADLs/mobility (see below for objective details and assist levels).  BP during functional tf: 101/40 and at rest in chair 117/47. Nursing notified of therapy outcomes. Will continue to progress. Recommend d/c to SNF once medically appropriate.       Other factors to consider for discharge: Time of onset, medical prognosis/diagnosis, severity of deficits, PLOF, functional baseline, home environment, and family support          PLAN :  Patient continues to benefit from skilled intervention to address the above impairments.  Continue treatment per established plan of care.  to address goals.    Recommend with staff: Out of bed to chair for meals and Encourage HEP in prep for  ADLs/mobility    Recommend next session: Toileting    Recommendation for discharge: (in order for the patient to meet his/her long term goals)  [place]    This discharge recommendation:  Has been made in collaboration with the attending provider and/or case management       IF patient discharges home will need the following DME: TBD       SUBJECTIVE:   Patient stated "I don't know, no one told me." regards to Hip precautions    OBJECTIVE DATA SUMMARY:   Cognitive/Behavioral Status:  Neurologic State: Alert  Orientation Level: Oriented X4  Cognition: Follows commands        Safety/Judgement: Lack of insight into deficits;Decreased awareness of need for safety    Functional Mobility and Transfers for ADLs:  Bed Mobility:  Rolling: Stand-by assistance  Supine to Sit: Stand-by assistance;Additional time  Scooting: Stand-by assistance    Transfers:  Sit to Stand: Minimum assistance;Assist x2     Bed to Chair: Minimum assistance;Assist x2;Additional time    Balance:  Sitting: Intact  Standing: Impaired;With support  Standing - Static: Constant support;Fair  Standing - Dynamic : Constant support;Fair    ADL Intervention:       Grooming  Grooming Assistance: Set-up  Position Performed: Seated in chair  Washing Face: Set-up       Lower Body Dressing Assistance  Dressing Assistance: Total assistance(dependent)  Socks: Total assistance (dependent)  Leg Crossed Method Used: No  Position Performed: Seated edge of bed    Toileting  Toileting Assistance: Total assistance(dependent)  Bladder Hygiene: Total assistance (dependent)  Clothing Management: Total assistance (dependent)    Cognitive Retraining  Safety/Judgement: Lack of insight into deficits;Decreased awareness of need for safety           Pain:  5/10 L HIP    Activity Tolerance:   Fair and requires rest breaks    After treatment patient left in no apparent distress:   Sitting in chair and Call bell within reach, bed locked and in lowest  position    COMMUNICATION/COLLABORATION:   The patient's plan of care was discussed with: Physical therapy assistant and Registered nurse.     The supervising occupational therapist and treating occupational therapist assistant have met to review this patient's progress and plan of care.    Rutherford Guys, COTA  Time Calculation: 28 mins     Problem: Self Care Deficits Care Plan (Adult)  Goal: *Acute Goals and Plan of Care (Insert Text)  Description:   FUNCTIONAL STATUS PRIOR TO ADMISSION: Patient required minimal assistance for basic and instrumental ADLs.     HOME SUPPORT: The patient lived with his son and required minimal assistance/contact guard assist for ADLs.    Occupational Therapy Goals  Initiated 12/19/2021  Patient Goal: Go to rehab and be able to walk to the bathroom.  1.  Patient will perform lower body dressing with modified independence within 7 day(s).  2.  Patient will perform grooming with modified independence within 7 day(s).  3.  Patient will perform bathing with modified independence within 7 day(s).  4.  Patient will perform toilet transfers with modified independence within 7 day(s).  5.  Patient will perform all aspects of toileting with modified independence within 7 day(s).  6.  Patient will participate in upper extremity therapeutic exercise/activities with independence within 7 day(s).    7.  Patient will utilize energy conservation techniques during functional activities with verbal cues within 7 day(s).  Outcome: Progressing Towards Goal

## 2021-12-20 NOTE — Progress Notes (Signed)
Pt voided 244ml after foley cath removal.

## 2021-12-21 ENCOUNTER — Encounter: Primary: Internal Medicine

## 2021-12-21 LAB — CBC WITH AUTOMATED DIFF
ABS. BASOPHILS: 0.1 10*3/uL (ref 0.0–0.1)
ABS. EOSINOPHILS: 0.2 10*3/uL (ref 0.0–0.4)
ABS. IMM. GRANS.: 0.2 10*3/uL — ABNORMAL HIGH (ref 0.00–0.04)
ABS. LYMPHOCYTES: 1.8 10*3/uL (ref 0.8–3.5)
ABS. MONOCYTES: 1.1 10*3/uL — ABNORMAL HIGH (ref 0.0–1.0)
ABS. NEUTROPHILS: 5.8 10*3/uL (ref 1.8–8.0)
ABSOLUTE NRBC: 0 10*3/uL (ref 0.00–0.01)
BASOPHILS: 1 % (ref 0–1)
EOSINOPHILS: 2 % (ref 0–7)
HCT: 26.3 % — ABNORMAL LOW (ref 36.6–50.3)
HGB: 7.7 g/dL — ABNORMAL LOW (ref 12.1–17.0)
IMMATURE GRANULOCYTES: 2 % — ABNORMAL HIGH (ref 0–0.5)
LYMPHOCYTES: 20 % (ref 12–49)
MCH: 24.2 PG — ABNORMAL LOW (ref 26.0–34.0)
MCHC: 29.3 g/dL — ABNORMAL LOW (ref 30.0–36.5)
MCV: 82.7 FL (ref 80.0–99.0)
MONOCYTES: 12 % (ref 5–13)
MPV: 9.1 FL (ref 8.9–12.9)
NEUTROPHILS: 63 % (ref 32–75)
NRBC: 0 PER 100 WBC
PLATELET: 223 10*3/uL (ref 150–400)
RBC: 3.18 M/uL — ABNORMAL LOW (ref 4.10–5.70)
RDW: 17.3 % — ABNORMAL HIGH (ref 11.5–14.5)
WBC: 9.2 10*3/uL (ref 4.1–11.1)

## 2021-12-21 LAB — TYPE & SCREEN
ABO/Rh(D): A NEG
Antibody screen: NEGATIVE
Antibody screen: NEGATIVE
Unit division: 0
Unit division: 0
Unit division: 0

## 2021-12-21 LAB — GLUCOSE, POC
Glucose (POC): 137 mg/dL — ABNORMAL HIGH (ref 65–100)
Glucose (POC): 123 mg/dL — ABNORMAL HIGH (ref 65–100)
Glucose (POC): 110 mg/dL — ABNORMAL HIGH (ref 65–100)
Glucose (POC): 118 mg/dL — ABNORMAL HIGH (ref 65–100)

## 2021-12-21 LAB — METABOLIC PANEL, BASIC
Anion gap: 5 mmol/L (ref 5–15)
BUN/Creatinine ratio: 17 (ref 12–20)
BUN: 16 mg/dL (ref 6–20)
CO2: 23 mmol/L (ref 21–32)
Calcium: 7.5 mg/dL — ABNORMAL LOW (ref 8.5–10.1)
Chloride: 106 mmol/L (ref 97–108)
Creatinine: 0.92 mg/dL (ref 0.70–1.30)
Glucose: 96 mg/dL (ref 65–100)
Potassium: 3.4 mmol/L — ABNORMAL LOW (ref 3.5–5.1)
Sodium: 134 mmol/L — ABNORMAL LOW (ref 136–145)
eGFR: >60 mL/min/{1.73_m2} (ref >60)

## 2021-12-21 LAB — BASIC METABOLIC PANEL
Anion Gap: 5 mmol/L (ref 5–15)
BUN: 16 mg/dL (ref 6–20)
Bun/Cre Ratio: 17 (ref 12–20)
CO2: 23 mmol/L (ref 21–32)
Calcium: 7.5 mg/dL — ABNORMAL LOW (ref 8.5–10.1)
Chloride: 106 mmol/L (ref 97–108)
Creatinine: 0.92 mg/dL (ref 0.70–1.30)
ESTIMATED GLOMERULAR FILTRATION RATE: >60 mL/min/{1.73_m2} (ref >60)
Glucose: 96 mg/dL (ref 65–100)
Potassium: 3.4 mmol/L — ABNORMAL LOW (ref 3.5–5.1)
Sodium: 134 mmol/L — ABNORMAL LOW (ref 136–145)

## 2021-12-21 LAB — CBC WITH AUTO DIFFERENTIAL
Basophils %: 1 % (ref 0–1)
Basophils Absolute: 0.1 10*3/uL (ref 0.0–0.1)
Eosinophils %: 2 % (ref 0–7)
Eosinophils Absolute: 0.2 10*3/uL (ref 0.0–0.4)
Granulocyte Absolute Count: 0.2 10*3/uL — ABNORMAL HIGH (ref 0.00–0.04)
Hematocrit: 26.3 % — ABNORMAL LOW (ref 36.6–50.3)
Hemoglobin: 7.7 g/dL — ABNORMAL LOW (ref 12.1–17.0)
Immature Granulocytes: 2 % — ABNORMAL HIGH (ref 0–0.5)
Lymphocytes %: 20 % (ref 12–49)
Lymphocytes Absolute: 1.8 10*3/uL (ref 0.8–3.5)
MCH: 24.2 PG — ABNORMAL LOW (ref 26.0–34.0)
MCHC: 29.3 g/dL — ABNORMAL LOW (ref 30.0–36.5)
MCV: 82.7 FL (ref 80.0–99.0)
MPV: 9.1 FL (ref 8.9–12.9)
Monocytes %: 12 % (ref 5–13)
Monocytes Absolute: 1.1 10*3/uL — ABNORMAL HIGH (ref 0.0–1.0)
NRBC Absolute: 0 10*3/uL (ref 0.00–0.01)
Neutrophils %: 63 % (ref 32–75)
Neutrophils Absolute: 5.8 10*3/uL (ref 1.8–8.0)
Nucleated RBCs: 0 PER 100 WBC
Platelets: 223 10*3/uL (ref 150–400)
RBC: 3.18 M/uL — ABNORMAL LOW (ref 4.10–5.70)
RDW: 17.3 % — ABNORMAL HIGH (ref 11.5–14.5)
WBC: 9.2 10*3/uL (ref 4.1–11.1)

## 2021-12-21 LAB — TYPE AND SCREEN
ABO/Rh: A NEG
Antibody Screen: NEGATIVE
Unit Divison: 0
Unit Divison: 0

## 2021-12-21 LAB — POCT GLUCOSE
POC Glucose: 137 mg/dL — ABNORMAL HIGH (ref 65–100)
POC Glucose: 123 mg/dL — ABNORMAL HIGH (ref 65–100)
POC Glucose: 110 mg/dL — ABNORMAL HIGH (ref 65–100)
POC Glucose: 118 mg/dL — ABNORMAL HIGH (ref 65–100)

## 2021-12-21 MED ORDER — FERROUS SULFATE 325 MG (65 MG ELEMENTAL IRON) TAB
325 mg (65 mg iron) | Freq: Every day | ORAL | Status: AC
Start: 2021-12-21 — End: 2021-12-24
  Administered 2021-12-21 – 2021-12-24 (×4): via ORAL

## 2021-12-21 MED ORDER — SODIUM CHLORIDE 0.9 % IV
INTRAVENOUS | Status: AC | PRN
Start: 2021-12-21 — End: 2021-12-24

## 2021-12-21 MED ORDER — APIXABAN 5 MG TABLET
5 mg | Freq: Two times a day (BID) | ORAL | Status: AC
Start: 2021-12-21 — End: 2021-12-24

## 2021-12-21 MED ORDER — APIXABAN 2.5 MG TABLET
2.5 mg | Freq: Two times a day (BID) | ORAL | Status: DC
Start: 2021-12-21 — End: 2021-12-24
  Administered 2021-12-21 – 2021-12-24 (×7): via ORAL

## 2021-12-21 MED FILL — ROSUVASTATIN 5 MG TAB: 5 mg | ORAL | Qty: 4

## 2021-12-21 MED FILL — RANOLAZINE SR 500 MG 12 HR TAB: 500 mg | ORAL | Qty: 1

## 2021-12-21 MED FILL — AMIODARONE 200 MG TAB: 200 mg | ORAL | Qty: 1

## 2021-12-21 MED FILL — CELECOXIB 200 MG CAP: 200 mg | ORAL | Qty: 1

## 2021-12-21 MED FILL — SYMBICORT 160 MCG-4.5 MCG/ACTUATION HFA AEROSOL INHALER: RESPIRATORY_TRACT | Qty: 6

## 2021-12-21 MED FILL — CARBIDOPA-LEVODOPA ER 25-100 MG TABLET, EXTENDED RELEASE: 25-100 mg | ORAL | Qty: 4

## 2021-12-21 MED FILL — PANTOPRAZOLE 40 MG TAB, DELAYED RELEASE: 40 mg | ORAL | Qty: 1

## 2021-12-21 MED FILL — STIMULANT LAXATIVE PLUS 8.6 MG-50 MG TABLET: ORAL | Qty: 1

## 2021-12-21 MED FILL — ACETAMINOPHEN 500 MG TAB: 500 mg | ORAL | Qty: 2

## 2021-12-21 MED FILL — CARVEDILOL 3.125 MG TAB: 3.125 mg | ORAL | Qty: 2

## 2021-12-21 MED FILL — SPIRIVA RESPIMAT 2.5 MCG/ACTUATION SOLUTION FOR INHALATION: 2.5 mcg/actuation | RESPIRATORY_TRACT | Qty: 8

## 2021-12-21 MED FILL — SODIUM CHLORIDE 0.9 % IV: INTRAVENOUS | Qty: 250

## 2021-12-21 MED FILL — ELIQUIS 2.5 MG TABLET: 2.5 mg | ORAL | Qty: 1

## 2021-12-21 MED FILL — FERROUS SULFATE 325 MG (65 MG ELEMENTAL IRON) TAB: 325 mg (65 mg iron) | ORAL | Qty: 1

## 2021-12-21 MED FILL — GABAPENTIN 100 MG CAP: 100 mg | ORAL | Qty: 1

## 2021-12-21 MED FILL — ENOXAPARIN 40 MG/0.4 ML SUB-Q SYRINGE: 40 mg/0.4 mL | SUBCUTANEOUS | Qty: 0.4

## 2021-12-21 MED FILL — JARDIANCE 10 MG TABLET: 10 mg | ORAL | Qty: 1

## 2021-12-21 NOTE — Progress Notes (Signed)
Progress Notes by Charisse Klinefelter, COTA at 12/21/21 0913                Author: Charisse Klinefelter, COTA  Service: Occupational Therapy  Author TypeCalton Golds       Filed: 12/21/21 1313  Date of Service: 12/21/21 0913  Status: Signed          Editor: Charisse Klinefelter, [place] (COTA)               OCCUPATIONAL THERAPY TREATMENT   Patient: Carlos Petersen (71 y.o. male)   Date: 12/21/2021   Diagnosis: Hip fracture ([place]) [S72.009A] <principal problem not specified>   Procedure(s) (LRB):   HIP HEMIARTHROPLASTY ON LEFT (Left) 3 Days Post-Op   Precautions:     In place during session: Wound vac L hip and EKG/telemetry    Chart, occupational therapy assessment, plan of care, and goals were reviewed.        ASSESSMENT   Pt continues with skilled OT services and is progressing towards goals. Upon COTA arrival, pt semi supine in bed and agreeable to tx session at this time.  Overall, pt continues to present with deficits  in generalized strength/AROM, coordination, bed mobility, static/dynamic sitting and standing balance and functional activity tolerance during performance of ADLs/mobility (see below for objective details and assist levels). Pt tolerated session fair  this date with completion of bed mobility, transfers, and ADLs.  Hip precautions reviewed while in supine and pt noted with hips internally rotated while in supine prior to beginning of session.  Pt completed bed mobility with SBA and additional time  and cueing provided to maintain posterior hip precautions.  Pt completed sit>stand transfer with MinA and additional time.  Pt noted with fair/poor standing balance and required Min/ModA x2 for balance while standing.  Pt able to complete few steps to  recliner but noted with B heels coming together with ambulation requiring cueing for separating.  Pt completed transfer into recliner with increased assist due to pt completing unsafe descent and requiring cueing for proper transfer technique.  Pt completed   grooming while seated with setup A and required Max-total A for donning/doffing of socks and shoes.  Pt completed seated UE therex (see chart below for details).  Pt left seated in recliner with call bell within reach and needs met.  Recommend d/c to  [place]  once medically appropriate.      Other factors to consider for discharge: family/social support, DME, time since onset, severity of deficits, decline from functional baseline               PLAN :   Patient continues to benefit from skilled intervention to address the above impairments.  Continue treatment per established plan of care to address goals.      Recommendation for discharge: (in order for the patient to meet his/her long term  goals)   [place]       This discharge recommendation:   Has been made in collaboration with the attending provider and/or case management      IF patient discharges home will need the following DME: TBD             SUBJECTIVE:     Patient stated I feel a little dizzy.        OBJECTIVE DATA SUMMARY:     Cognitive/Behavioral Status:   Neurologic State: Alert   Orientation Level: Oriented X4   Cognition: Follows commands  Functional Mobility and Transfers for ADLs:   Bed Mobility:   Rolling: Stand-by assistance   Supine to Sit: Stand-by assistance;Additional time   Scooting: Stand-by assistance      Transfers:   Sit to Stand: Minimum assistance;Assist x1;Additional time       Bed to Chair: Moderate assistance;Assist x2;Additional time      Balance:   Sitting: Impaired   Standing: Impaired;With support   Standing - Static: Fair;Constant support   Standing - Dynamic : Poor;Constant support      ADL Intervention:   Grooming   Position Performed: Seated in chair   Washing Face: Set-up      Lower Body Dressing Assistance   Socks: Total assistance (dependent)   Slip on Shoes with Back: Maximum assistance      Therapeutic Exercises:             Exercise  Sets  Reps  AROM  AAROM   PROM  Self PROM  Comments              Elbow flex/ext  1  10  '[x]'   '[]'   '[]'   '[]'                 Wrist flex/ext  1  10  '[x]'   '[]'   '[]'   '[]'           Pain:   0/10 at rest   5/10 L hip pain with mobility      Activity Tolerance:    Fair and requires rest breaks   Please refer to the flowsheet for vital signs taken during this treatment.      After treatment patient left in no apparent distress:    Bed locked and in lowest position  Sitting in chair and Call bell within reach        COMMUNICATION/COLLABORATION:     The patients plan of care was discussed with: Physical therapy assistant, Registered nurse, and Ortho PA .       [place] with PT for increased safety for pt and clinician.      Hailey Bernshausen, COTA   Time Calculation: 28 mins      Problem: Self Care Deficits Care Plan (Adult)   Goal: *Acute Goals and Plan of Care (Insert Text)   Description:   FUNCTIONAL STATUS PRIOR TO ADMISSION: Patient required minimal assistance for basic and instrumental ADLs.     HOME  SUPPORT: The patient lived with his son and required minimal assistance/contact guard assist for ADLs.    Occupational Therapy Goals  Initiated 12/19/2021  Patient Goal: Go to rehab and be able to walk to the bathroom.  1.  Patient will  perform lower body dressing with modified independence within 7 day(s).  2.  Patient will perform grooming with modified independence within 7 day(s).  3.  Patient will perform bathing with modified independence within 7 day(s).  4.  Patient  will perform toilet transfers with modified independence within 7 day(s).  5.  Patient will perform all aspects of toileting with modified independence within 7 day(s).  6.  Patient will participate in upper extremity therapeutic exercise/activities  with independence within 7 day(s).    7.  Patient will utilize energy conservation techniques during functional activities with verbal cues within 7 day(s).   Outcome: Progressing Towards Goal

## 2021-12-21 NOTE — Progress Notes (Signed)
Problem: Mobility Impaired (Adult and Pediatric)  Goal: *Acute Goals and Plan of Care (Insert Text)  Description: FUNCTIONAL STATUS PRIOR TO ADMISSION: Patient required minimal assistance for basic and instrumental ADLs.    HOME SUPPORT PRIOR TO ADMISSION: The patient lived with son and required minimal assistance/contact guard assist for ADLs.    Patient stated goal: get better  Patient will move from supine to sit and sit to supine , scoot up and down, and roll side to side in bed with minimal assistance/contact guard assist within 7 day(s).    Patient will transfer from bed to chair and chair to bed with minimal assistance/contact guard assist using the least restrictive device within 7 day(s).    Patient will improve static standing balance to minimal assistance within 1 week(s).  Patient will ambulate 30 feet with minimal assistance with least restrictive device within 1 weeks.   WBAT LLE  Outcome: Progressing Towards Goal   PHYSICAL THERAPY TREATMENT  Patient: Carlos Petersen (71 y.o. male)  Date: 12/21/2021  Diagnosis: Hip fracture (HCC) [S72.009A] <principal problem not specified>  Procedure(s) (LRB):  HIP HEMIARTHROPLASTY ON LEFT (Left) 3 Days Post-Op  Precautions:    In place during session: Wound vac L hip and EKG/telemetry   Chart, physical therapy assessment, plan of care and goals were reviewed.    ASSESSMENT  Patient continues with skilled PT services and is progressing towards goals. Pt semi supine upon PTA/COTA arrival, agreeable to session. (See below for objective details and assist levels). Overall pt tolerated session fair today with bed mobility, transfers and therex. Pt continues to perform bed mobility with SBA and additional time. Pt requiring increased A this session due to decreased activity tolerance, pt reports fatigue and dizziness. Able to transfer to recliner with mod A x2, demo's decreased coordination and requiring increased A to maintain balance. Pt presenting with excessive  narrow BOS despite cueing, BLE flexed and rotated unable to maintain safe dynamic balance in standing. Pt received education on poster hip precautions, can benefit from additional services to promote mobility and decrease risk for complication. Will continue to benefit from skilled PT services, and will continue to progress as tolerated.     Current Level of Function Impacting Discharge (mobility/balance): functional mobility     Other factors to consider for discharge: PLOF          PLAN :  Patient continues to benefit from skilled intervention to address the above impairments.  Continue treatment per established plan of care to address goals.    Recommend with staff: Out of bed to chair for meals and Encourage HEP in prep for ADLs/mobility    Recommendation for discharge: (in order for the patient to meet his/her long term goals)  Skilled Nursing Facility    This discharge recommendation:  Has been made in collaboration with the attending provider and/or case management    IF patient discharges home will need the following DME: to be determined (TBD)       SUBJECTIVE:   Patient stated "I'm just really tired."    OBJECTIVE DATA SUMMARY:   Critical Behavior:  Neurologic State: Alert  Orientation Level: Oriented X4  Cognition: Follows commands  Safety/Judgement: Lack of insight into deficits, Decreased awareness of need for safety  Functional Mobility Training:  Bed Mobility:  Rolling: Stand-by assistance  Supine to Sit: Stand-by assistance;Additional time  Scooting: Stand-by assistance  Transfers:  Sit to Stand: Minimum assistance;Assist x1;Additional time  Stand to Sit: Moderate assistance;Assist x2  Bed  to Chair: Moderate assistance;Assist x2;Additional time  Balance:  Sitting: Impaired  Standing: Impaired;With support  Standing - Static: Fair;Constant support  Standing - Dynamic : Poor;Constant support  Ambulation/Gait Training:  Distance (ft): 2 Feet (ft)  Assistive Device: Gait belt;Walker, rolling  Ambulation  - Level of Assistance: Moderate assistance;Assist x2;Additional time  Gait Abnormalities: Antalgic  Base of Support: Narrowed  Speed/Cadence: Shuffled;Slow    Therapeutic Exercises:       EXERCISE   Sets   Reps   Active Active Assist   Passive Self ROM   Comments   Ankle Pumps 1 10 [x]  []  []  []     Quad Sets/Glut Sets   []  []  []  []     Hamstring Sets   []  []  []  []     Short Arc Quads   []  []  []  []     Heel Slides   []  []  []  []     Straight Leg Raises   []  []  []  []     Hip abd/add   []  []  []  []     Long Arc Quads 1 10 [x]  []  []  []     Marching 1 10 [x]  []  []  []        []  []  []  []        Pain Rating:  0/10 at rest, 5/10 weightbearing LLE    Activity Tolerance:   Fair and requires rest breaks    After treatment patient left in no apparent distress:   Bed locked and returned to lowest position, Sitting in chair and Call bell within reach    COMMUNICATION/COLLABORATION:   The patient's plan of care was discussed with: Occupational therapy assistant and Registered nurse.     PT/OT sessions occurred together for increased safety of pt and clinician.     , PTA, PT   Time Calculation: 27 mins

## 2021-12-21 NOTE — Progress Notes (Signed)
Progress Notes by Waldron Labs, PA at 12/21/21 0813                Author: Waldron Labs, PA  Service: Physician Assistant  Author Type: Physician Assistant       Filed: 12/21/21 1443  Date of Service: 12/21/21 0813  Status: Signed           Editor: Waldron Labs, PA (Physician Assistant)  Cosigner: Alfonzo Beers, MD at 12/21/21 1607                 Hospitalist Progress Note                    Daily Progress Note: 12/21/2021 8:14 AM     Hospital course:     Carlos Petersen is a 71 y.o. male past medical history of COPD, diabetes, heart disease, hypertension, hyperlipidemia,  Parkinson's disease, pulmonary embolism on anticoagulation that presented to the ED on 12/17/2021 after sustaining ground-level fall 2 days prior.   In the ED x-ray of the femur and pelvis showed an acute mildly displaced subcapital fracture of the left femoral neck.  Laboratory data showed a hemoglobin 8.1.  Vital signs stable.   Requested admission for ORIF of the left hip. Ortho consulted. left ORIF performed on 12/18/2021. Cardiology consulted for clearance. ECHO shows EF 60-65%.  Patient's hemoglobin is 4.3.  Unsure of the accuracy of the results however he was  was transfused  1 unit packed red blood cells.  There is only 200 cc blood loss per operative note.  Iron studies revealed low iron; low TIBC, and low Iron% saturation.         Subjective:        Seen and examined at bedside. Reports fatigue and dizziness, but notes improvement in hip pain. Denies other complaints at this time.        Assessment/Plan:     Active Problems:     Hip fracture (HCC) (12/17/2021)      Acute mildly displaced subcapital fracture of the left femoral neck s/p ORIF #3   -- X-rays of the left hip and pelvis shows left femoral neck fracture.     -- Orthopedics consulted and performed surgery on 04/221/2023.   --PT OT recommending SNF     --Tylenol, tramadol, IV morphine as needed for pain.          Heart disease/hypertension   -- EKG NSR  with 1st degree AV block.     --2D echo with  normal EF.     -- Continue Cardiac telemetry.     --Cardiology gave clearance for surgery     --Chest x-ray no acute process.     --Patient on amiodarone  daily, Coreg 6.25 mg twice daily, Ranexa 500 mg twice daily.        Hyperlipidemia   --Continue Crestor 20 mg at bedtime      Type 2 diabetes   --Continue with Jardiance 10 mg daily.  Insulin sliding scale.      COPD   --Oxygen saturation within normal limits on room air.     --CXR reveals no acute disease   --Symbicort/Spiriva.     --Albuterol as needed      Parkinson's disease   --Continue with Sinemet 3 times a day      History of PE   --hold patient's Eliquis.     --Patient is not hypoxic.     --Will resume when HGB stabilizes  Chronic anemia   -- S/P 1 unit packed red blood cells on 12/18/2021    --Iron studies revealed low iron; low TIBC, and low Iron% saturation.     --If Hgb drops below 7 transfuse   --continue to monitor         DVT Prophylaxis:held secondary to anemia   Code Status: DNR   POA/NOK:      Medical Decision Making:      Labs reviewed by myself: CBC,BMP      Diagnostic data reviewed by myself: CXR, XR LT Hip      Toxic drug monitoring:  insulin and other diabetes medications for signs/symptoms of hypoglycemia      Discussed case with Bridget Hartshorn, PA-C      MDM Discussion: Patient with numerous medical comorbidities, each with increased risk for mortality and morbidity if left untreated.  Patient requires medications with high risk of toxicity and need  for intensive monitoring.  I have reviewed patient's presenting subjective and objective findings, as well as all laboratory studies, imaging studies, and vital signs to date as well as treatment rendered and patient's response to those treatments.  In  addition, prior medical, surgical and relevant social and family histories were reviewed.          Disposition and discharge barriers:      Placement; ortho clearance; stabilization of  Hgb   Care Plan discussed with: Patient, nursing, case management        Current Facility-Administered Medications          Medication  Dose  Route  Frequency           ?  apixaban (ELIQUIS) tablet 2.5 mg   2.5 mg  Oral  Q12H           ?  [START ON 12/27/2021] apixaban (ELIQUIS) tablet 5 mg   5 mg  Oral  Q12H           ?  oxyCODONE IR (ROXICODONE) tablet 5 mg   5 mg  Oral  Q4H PRN     ?  0.9% sodium chloride infusion   50 mL/hr  IntraVENous  CONTINUOUS     ?  0.9% sodium chloride infusion 250 mL   250 mL  IntraVENous  PRN     ?  sodium chloride (NS) flush 5-40 mL   5-40 mL  IntraVENous  Q8H     ?  acetaminophen (TYLENOL) tablet 1,000 mg   1,000 mg  Oral  Q6H     ?  oxyCODONE IR (ROXICODONE) tablet 5 mg   5 mg  Oral  Q4H PRN     ?  oxyCODONE IR (ROXICODONE) tablet 10 mg   10 mg  Oral  Q4H PRN     ?  naloxone (NARCAN) injection 0.4 mg   0.4 mg  IntraVENous  PRN     ?  senna-docusate (PERICOLACE) 8.6-50 mg per tablet 1 Tablet   1 Tablet  Oral  BID     ?  0.9% sodium chloride infusion 250 mL   250 mL  IntraVENous  PRN     ?  albuterol (PROVENTIL HFA, VENTOLIN HFA, PROAIR HFA) inhaler 2 Puff   2 Puff  Inhalation  Q6H PRN     ?  amiodarone (CORDARONE) tablet 200 mg   200 mg  Oral  DAILY     ?  carbidopa-levodopa ER (SINEMET CR) 25-100 mg per tablet 4 Tablet   4 Tablet  Oral  TID     ?  carvediloL (COREG) tablet 6.25 mg   6.25 mg  Oral  BID     ?  empagliflozin (JARDIANCE) tablet 10 mg   10 mg  Oral  DAILY     ?  budesonide-formoteroL (SYMBICORT) 160-4.5 mcg/actuation HFA inhaler 1 Puff   1 Puff  Inhalation  BID RT          And           ?  tiotropium bromide (SPIRIVA RESPIMAT) 2.5 mcg /actuation   2 Puff  Inhalation  DAILY     ?  gabapentin (NEURONTIN) capsule 100 mg   100 mg  Oral  BID     ?  pantoprazole (PROTONIX) tablet 40 mg   40 mg  Oral  DAILY     ?  ranolazine ER (RANEXA) tablet 500 mg   500 mg  Oral  BID     ?  rosuvastatin (CRESTOR) tablet 20 mg   20 mg  Oral  QHS     ?  sodium chloride (NS) flush 5-40 mL   5-40  mL  IntraVENous  PRN     ?  acetaminophen (TYLENOL) tablet 650 mg   650 mg  Oral  Q6H PRN     ?  polyethylene glycol (MIRALAX) packet 17 g   17 g  Oral  DAILY PRN     ?  ondansetron (ZOFRAN ODT) tablet 4 mg   4 mg  Oral  Q8H PRN          Or           ?  ondansetron (ZOFRAN) injection 4 mg   4 mg  IntraVENous  Q6H PRN     ?  glucose chewable tablet 16 g   4 Tablet  Oral  PRN     ?  glucagon (GLUCAGEN) injection 1 mg   1 mg  IntraMUSCular  PRN     ?  dextrose 10% infusion 0-250 mL   0-250 mL  IntraVENous  PRN     ?  insulin lispro (HUMALOG) injection     SubCUTAneous  AC&HS           ?  celecoxib (CELEBREX) capsule 200 mg   200 mg  Oral  BID            REVIEW OF SYSTEMS      Review of Systems    Constitutional:  Positive for malaise/fatigue.    Eyes:  Negative for blurred vision and double vision.    Respiratory:  Negative for cough, shortness of breath and wheezing.     Cardiovascular:  Negative for chest pain, palpitations and leg swelling.    Gastrointestinal:  Negative for abdominal pain, heartburn and nausea.    Musculoskeletal:  Positive for joint pain (L. Hip) and  myalgias.    Neurological:  Positive for dizziness. Negative for headaches.          Objective:        Visit Vitals      BP  (!) 116/59 (BP 1 Location: Left upper arm, BP Patient Position: At rest;Semi fowlers)     Pulse  75     Temp  97.8 F (36.6 C)     Resp  18     Ht   (1.803 m)     Wt  90.1 kg (198 lb 10.2 oz)     SpO2  96%  BMI  27.70 kg/m      O2 Flow Rate (L/min): 1 l/min O2 Device: None (Room air)      Temp (24hrs), Avg:97.8 F (36.6 C), Min:97.4 F (36.3 C), Max:98.1 F (36.7 C)            PHYSICAL EXAM:      Physical Exam   Vitals and nursing note reviewed.    Constitutional:        General: He is not in acute distress.      Appearance: He is ill-appearing.    HENT:       Head: Normocephalic and atraumatic.    Cardiovascular:       Rate and Rhythm: Normal rate and regular rhythm.       Heart sounds: No murmur heard.    Pulmonary:       Effort: Pulmonary effort is normal. No respiratory distress.       Breath sounds: Rhonchi present.     Abdominal:       General: Abdomen is flat. There is no distension.       Palpations: Abdomen is soft.       Tenderness: There is no abdominal tenderness.     Musculoskeletal:          General: No tenderness.       Comments: Dressing in place       Skin:      General: Skin is warm.    Neurological:       Mental Status: He is alert. Mental status is at baseline.           Data Review        Recent Results (from the past 24 hour(s))     GLUCOSE, POC          Collection Time: 12/20/21 11:31 AM         Result  Value  Ref Range            Glucose (POC)  127 (H)  65 - 100 mg/dL       Performed by  Orma Render         GLUCOSE, POC          Collection Time: 12/20/21  4:26 PM         Result  Value  Ref Range            Glucose (POC)  121 (H)  65 - 100 mg/dL       Performed by  Cherylann Parr         GLUCOSE, POC          Collection Time: 12/20/21  9:59 PM         Result  Value  Ref Range            Glucose (POC)  137 (H)  65 - 100 mg/dL            Performed by  KEMP SUSIE (Float [place])               DUPLEX LOWER EXT VENOUS BILAT       Final Result            XR HIP LT W OR WO PELV 2-3 VWS       Final Result     Left hip hemiarthroplasty with expected immediate postoperative appearance.            XR CHEST PORT       Final Result  Stable minimal atelectasis or scar. Stable right midlung field parenchymal     opacity. No new acute disease.                 XR FEMUR LT 2 V       Final Result          1. Acute, mildly displaced subcapital fracture of the LEFT femoral neck.          2. No additional acute fracture of the LEFT femur is identified.            XR PELV AP ONLY       Final Result          1. Acute, mildly displaced subcapital fracture of the LEFT femoral neck.          2. No additional acute fracture of the LEFT femur is identified.                  Intake and Output:   Current Shift: No  intake/output data recorded.   Last three shifts: 04/22 1901 - 04/24 0700   In: -    Out: 1000 [Urine:1000]         Lab/Data Review:     Recent Labs             12/20/21   0720  12/19/21   0422  12/18/21   2116     WBC  6.8   --   5.6     HGB  7.2*  7.5*  4.3*     HCT  23.8*  24.9*  14.9*          PLT  185   --   158          Recent Labs            12/20/21   0720  12/19/21   0422     NA  136  135*     K  4.2  3.9     CL  108  108     CO2  22  23     GLU  103*  84     BUN  15  13     CREA  0.77  0.58*         CA  7.4*  7.5*        No results for input(s): PH, PCO2, PO2, HCO3, FIO2 in the last 72 hours.     Recent Results (from the past 24 hour(s))     GLUCOSE, POC          Collection Time: 12/20/21 11:31 AM         Result  Value  Ref Range            Glucose (POC)  127 (H)  65 - 100 mg/dL       Performed by  Orma Render         GLUCOSE, POC          Collection Time: 12/20/21  4:26 PM         Result  Value  Ref Range            Glucose (POC)  121 (H)  65 - 100 mg/dL       Performed by  Cherylann Parr         GLUCOSE, POC          Collection Time: 12/20/21  9:59 PM  Result  Value  Ref Range            Glucose (POC)  137 (H)  65 - 100 mg/dL            Performed by  Marden Noble SUSIE (Float [place])                  _____________________________________________________________________________   Time spent in direct care including coordination of service, review of data and examination: 36 minutes      ______________________________________________________________________________      Waldron Labs, PA      This is dictation was done by dragon, computer voice recognition software.  Quite often unanticipated grammatical, syntax, homophones and other interpretive errors or inadvertently transcribed by the computer software.  Please excuse errors that have  escaped final proofreading.  Thank you.

## 2021-12-21 NOTE — Progress Notes (Signed)
Progress Notes by Theresia Bough, NP at 12/21/21 1429                Author: Theresia Bough, NP  Service: Nurse Practitioner  Author Type: Nurse Practitioner       Filed: 12/21/21 1431  Date of Service: 12/21/21 1429  Status: Attested           Editor: Theresia Bough, NP (Nurse Practitioner)  Cosigner: Robley Fries, MD at 12/21/21 1642          Attestation signed by Robley Fries, MD at 12/21/21 1642          I have seen and examined patient and agree with the assesment and plan as documented by Yvonna Alanis, NP. Examination findings notable for stable vital signs  and exam.  Plan is to continue with current cardiac medications and work up, as well as other items noted in above.                                     CARDIOLOGY PROGESS NOTE         Carlos Petersen is a 71 y.o. year-old male with past medical history significant for PE, COPD, HLD, Parkinson's, and DM who was evaluated on 4/20 for preop cardiac evaluation prior to Left hip surgery due to femoral neck fracture from fall.      Patient seen and examined. He underwent left hemiarthroplasty Friday, uncomplicated. Complains of pain when up with therapy but otherwise doing well. No report of chest pain.       Records from hospital admission course thus far reviewed.       Telemetry reviewed.       INPATIENT MEDICATIONS:  Home medications reviewed.     Current Facility-Administered Medications:    ?  apixaban (ELIQUIS) tablet 2.5 mg, 2.5 mg, Oral, Q12H, Young, Chevin L, PA-C, 2.5 mg at 12/21/21 1039   ?  [START ON 12/27/2021] apixaban (ELIQUIS) tablet 5 mg, 5 mg, Oral, Q12H, Young, Chevin L, PA-C   ?  ferrous sulfate tablet 325 mg, 1 Tablet, Oral, DAILY WITH BREAKFAST, [place], [place], [place], 325 mg at 12/21/21 9604   ?  0.9% sodium chloride infusion 250 mL, 250 mL, IntraVENous, PRN, Young, Chevin L, PA-C   ?  oxyCODONE IR (ROXICODONE) tablet 5 mg, 5 mg, Oral, Q4H PRN, Young, Chevin L, PA-C, 5 mg at 12/20/21 1930   ?  0.9% sodium chloride  infusion, 50 mL/hr, IntraVENous, CONTINUOUS, Young, Chevin L, PA-C, Last Rate: 50 mL/hr at 12/20/21 0447, 50 mL/hr  at 12/20/21 0447   ?  0.9% sodium chloride infusion 250 mL, 250 mL, IntraVENous, PRN, Hope, Karrie Doffing., MD   ?  sodium chloride (NS) flush 5-40 mL, 5-40 mL, IntraVENous, Q8H, Benaissa, Rafik, MD, 10 mL at 12/20/21 1634   ?  acetaminophen (TYLENOL) tablet 1,000 mg, 1,000 mg, Oral, Q6H, Benaissa, Rafik, MD, 1,000 mg at 12/21/21 1146   ?  oxyCODONE IR (ROXICODONE) tablet 5 mg, 5 mg, Oral, Q4H PRN, Smith Mince, MD, 5 mg at 12/19/21 1958   ?  oxyCODONE IR (ROXICODONE) tablet 10 mg, 10 mg, Oral, Q4H PRN, Polo Riley, Rafik, MD, 10 mg at 12/20/21 0840   ?  naloxone (NARCAN) injection 0.4 mg, 0.4 mg, IntraVENous, PRN, Polo Riley, Rafik, MD   ?  senna-docusate (PERICOLACE) 8.6-50 mg per tablet 1 Tablet, 1 Tablet, Oral, BID, Smith Mince, MD,  1 Tablet at 12/21/21 [id number]   ?  0.9% sodium chloride infusion 250 mL, 250 mL, IntraVENous, PRN, Tegegn, Nahom, MD   ?  albuterol (PROVENTIL HFA, VENTOLIN HFA, PROAIR HFA) inhaler 2 Puff, 2 Puff, Inhalation, Q6H PRN, Bishop, Krysten, PA-C   ?  amiodarone (CORDARONE) tablet 200 mg, 200 mg, Oral, DAILY, Bishop, Krysten, PA-C, 200 mg at 12/21/21 [id number]   ?  carbidopa-levodopa ER (SINEMET CR) 25-100 mg per tablet 4 Tablet, 4 Tablet, Oral, TID, Bishop, Krysten, PA-C, 4 Tablet at 12/21/21 [id number]   ?  carvediloL (COREG) tablet 6.25 mg, 6.25 mg, Oral, BID, Bishop, Krysten, PA-C, 6.25 mg at 12/21/21 [id number]   ?  empagliflozin (JARDIANCE) tablet 10 mg, 10 mg, Oral, DAILY, Bishop, Krysten, PA-C, 10 mg at 12/21/21 [id number]   ?  budesonide-formoteroL (SYMBICORT) 160-4.5 mcg/actuation HFA inhaler 1 Puff, 1 Puff, Inhalation, BID RT, 1 Puff at 12/21/21 0747 **AND**  tiotropium bromide (SPIRIVA RESPIMAT) 2.5 mcg /actuation, 2 Puff, Inhalation, DAILY, Bishop, MarquetteKrysten, PA-C, 2 Puff at 12/21/21 [id number]   ?  gabapentin (NEURONTIN) capsule 100 mg, 100 mg, Oral, BID, Bishop, Krysten, PA-C, 100 mg at 12/21/21  [id number]   ?  pantoprazole (PROTONIX) tablet 40 mg, 40 mg, Oral, DAILY, Bishop, Krysten, PA-C, 40 mg at 12/21/21 [id number]   ?  ranolazine ER (RANEXA) tablet 500 mg, 500 mg, Oral, BID, Bishop, Krysten, PA-C, 500 mg at 12/21/21 [id number]   ?  rosuvastatin (CRESTOR) tablet 20 mg, 20 mg, Oral, QHS, Bishop, Krysten, PA-C, 20 mg at 12/20/21 2155   ?  sodium chloride (NS) flush 5-40 mL, 5-40 mL, IntraVENous, PRN, Bishop, Krysten, PA-C   ?  acetaminophen (TYLENOL) tablet 650 mg, 650 mg, Oral, Q6H PRN **OR** [DISCONTINUED] acetaminophen (TYLENOL) suppository 650 mg, 650 mg,  Rectal, Q6H PRN, Bishop, Krysten, PA-C   ?  polyethylene glycol (MIRALAX) packet 17 g, 17 g, Oral, DAILY PRN, Bishop, Krysten, PA-C   ?  ondansetron (ZOFRAN ODT) tablet 4 mg, 4 mg, Oral, Q8H PRN **OR** ondansetron (ZOFRAN) injection 4 mg, 4 mg, IntraVENous, Q6H PRN, Bishop,  Krysten, PA-C, 4 mg at 12/19/21 2206   ?  glucose chewable tablet 16 g, 4 Tablet, Oral, PRN, Bishop, Krysten, PA-C   ?  glucagon (GLUCAGEN) injection 1 mg, 1 mg, IntraMUSCular, PRN, Bishop, Krysten, PA-C   ?  dextrose 10% infusion 0-250 mL, 0-250 mL, IntraVENous, PRN, Bishop, Krysten, PA-C   ?  insulin lispro (HUMALOG) injection, , SubCUTAneous, AC&HS, Bishop, Krysten, PA-C   ?  celecoxib (CELEBREX) capsule 200 mg, 200 mg, Oral, BID, Young, Chevin L, PA-C, 200 mg at 12/21/21 [id number]       ALLERGIES:  Allergies reviewed with the patient,No Known Allergies .      REVIEW OF SYSTEMS:  Complete review of systems performed, pertinents noted above, all other systems are negative.      Physical examination :   No apparent distress.   Heart is regular, rate and rhythm. Normal S1, S2, no murmurs are appreciated.   Lungs are clear bilaterally.   Abdomen is soft, nontender, normal bowel sounds.   Extremities have no edema.      Visit Vitals      BP  (!) 116/59 (BP 1 Location: Left upper arm, BP Patient Position: At rest;Semi fowlers)        Pulse  75     Temp  97.8 F (36.6 C)     Resp  18     Ht  5\' 11"   (1.803 m)  Wt  90.1 kg (198 lb 10.2 oz)     SpO2  96%        BMI  27.70 kg/m           Recent labs results and imaging reviewed.       Impression and plan as discussed with Dr. Dillon Bjork   1.  S/p left hip hemiarthroplasty, uncomplicated.    2.  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 Recent echo with EF 55%  Continue amiodarone.    3.  History of PE,  Eliquis resumed per Ortho.    4.  COPD: Primary following.       Please do not hesitate to call me if additional questions arise.      Theresia Bough, NP   12/21/2021

## 2021-12-21 NOTE — Progress Notes (Signed)
Progress Notes by Moody Bruins, PA-C at 12/21/21 1004                Author: Moody Bruins, PA-C  Service: Physician Assistant  Author Type: Physician Assistant       Filed: 12/21/21 1008  Date of Service: 12/21/21 1004  Status: Signed           Editor: Lenny Pastel (Physician Assistant)  Cosigner: Williemae Natter, MD at 12/22/21 [phone]                    Orthopedic progress note      Date:12/21/2021       Room:508/01   Patient Name:Carlos Petersen     Date of Birth:1951-05-09     Age:71 y.o.            Subjective       71 year old male status post ORIF left hip.  Postop day #3.   Patient is complaining of mild pain and weakness of his left leg.   Therapy reports patient was slightly unsteady with his gait this morning.  He did complain of some dizziness.   He is complaining of feeling fatigued this morning.   No other complaints at this time.   Objective                      Vitals Last 24 Hours:   TEMPERATURE:  Temp  Avg: 97.7 F (36.5 C)  Min: 97.4 F (36.3 C)  Max: 98 F (36.7 C)   RESPIRATIONS RANGE: Resp  Avg: 18.4  Min: 18  Max: 20   PULSE OXIMETRY RANGE: SpO2  Avg: 95.4 %  Min: 93 %  Max: 98 %   PULSE RANGE: Pulse  Avg: 73.6  Min: 67  Max: 78   BLOOD PRESSURE RANGE: Systolic (28BTD), VVO:160 , Min:105 , VPX:106    ; Diastolic (26RSW), NIO:27, Min:52, Max:61        Current Facility-Administered Medications          Medication  Dose  Route  Frequency           ?  apixaban (ELIQUIS) tablet 2.5 mg   2.5 mg  Oral  Q12H     ?  [START ON 12/27/2021] apixaban (ELIQUIS) tablet 5 mg   5 mg  Oral  Q12H     ?  ferrous sulfate tablet 325 mg   1 Tablet  Oral  DAILY WITH BREAKFAST     ?  oxyCODONE IR (ROXICODONE) tablet 5 mg   5 mg  Oral  Q4H PRN     ?  0.9% sodium chloride infusion   50 mL/hr  IntraVENous  CONTINUOUS           ?  0.9% sodium chloride infusion 250 mL   250 mL  IntraVENous  PRN           ?  sodium chloride (NS) flush 5-40 mL   5-40 mL  IntraVENous  Q8H     ?  acetaminophen (TYLENOL)  tablet 1,000 mg   1,000 mg  Oral  Q6H     ?  oxyCODONE IR (ROXICODONE) tablet 5 mg   5 mg  Oral  Q4H PRN     ?  oxyCODONE IR (ROXICODONE) tablet 10 mg   10 mg  Oral  Q4H PRN     ?  naloxone (NARCAN) injection 0.4 mg   0.4 mg  IntraVENous  PRN     ?  senna-docusate (PERICOLACE) 8.6-50 mg per tablet 1 Tablet   1 Tablet  Oral  BID     ?  0.9% sodium chloride infusion 250 mL   250 mL  IntraVENous  PRN     ?  albuterol (PROVENTIL HFA, VENTOLIN HFA, PROAIR HFA) inhaler 2 Puff   2 Puff  Inhalation  Q6H PRN     ?  amiodarone (CORDARONE) tablet 200 mg   200 mg  Oral  DAILY     ?  carbidopa-levodopa ER (SINEMET CR) 25-100 mg per tablet 4 Tablet   4 Tablet  Oral  TID     ?  carvediloL (COREG) tablet 6.25 mg   6.25 mg  Oral  BID     ?  empagliflozin (JARDIANCE) tablet 10 mg   10 mg  Oral  DAILY     ?  budesonide-formoteroL (SYMBICORT) 160-4.5 mcg/actuation HFA inhaler 1 Puff   1 Puff  Inhalation  BID RT          And           ?  tiotropium bromide (SPIRIVA RESPIMAT) 2.5 mcg /actuation   2 Puff  Inhalation  DAILY           ?  gabapentin (NEURONTIN) capsule 100 mg   100 mg  Oral  BID     ?  pantoprazole (PROTONIX) tablet 40 mg   40 mg  Oral  DAILY     ?  ranolazine ER (RANEXA) tablet 500 mg   500 mg  Oral  BID     ?  rosuvastatin (CRESTOR) tablet 20 mg   20 mg  Oral  QHS     ?  sodium chloride (NS) flush 5-40 mL   5-40 mL  IntraVENous  PRN     ?  acetaminophen (TYLENOL) tablet 650 mg   650 mg  Oral  Q6H PRN     ?  polyethylene glycol (MIRALAX) packet 17 g   17 g  Oral  DAILY PRN     ?  ondansetron (ZOFRAN ODT) tablet 4 mg   4 mg  Oral  Q8H PRN          Or           ?  ondansetron (ZOFRAN) injection 4 mg   4 mg  IntraVENous  Q6H PRN     ?  glucose chewable tablet 16 g   4 Tablet  Oral  PRN     ?  glucagon (GLUCAGEN) injection 1 mg   1 mg  IntraMUSCular  PRN     ?  dextrose 10% infusion 0-250 mL   0-250 mL  IntraVENous  PRN     ?  insulin lispro (HUMALOG) injection     SubCUTAneous  AC&HS           ?  celecoxib (CELEBREX) capsule  200 mg   200 mg  Oral  BID         Review of Systems    Constitutional: Negative for malaise/fatigue.    Respiratory: Negative for cough, shortness of breath and wheezing.     Cardiovascular: Negative for chest pain and palpitations.    Gastrointestinal: Negative for abdominal pain, heartburn, nausea and vomiting.    Neurological: Negative for headaches.    Musculoskeletal: Denies any numbness/tingling of operative extremity       I/O (24Hr):      Intake/Output Summary (Last 24 hours) at 12/21/2021  1004   Last data filed at 12/21/2021 0644     Gross per 24 hour        Intake  --        Output  500 ml        Net  -500 ml        Objective:   Vital signs: (most recent): Blood pressure (!) 116/59, pulse 75, temperature 97.8 F (36.6 C), resp. rate 18, height '5\' 11"'  (1.803 m), weight 90.1 kg (198 lb 10.2 oz), SpO2 96 %.        Labs/Imaging/Diagnostics       Labs:   CBC:     Recent Labs             12/20/21   0720  12/19/21   0422  12/18/21   2116     WBC  6.8   --   5.6     RBC  2.96*   --   1.82*     HGB  7.2*  7.5*  4.3*     HCT  23.8*  24.9*  14.9*     MCV  80.4   --   81.9     RDW  17.4*   --   17.0*          PLT  185   --   158        CHEMISTRIES:     Recent Labs            12/20/21   0720  12/19/21   0422     NA  136  135*     K  4.2  3.9     CL  108  108     CO2  22  23     BUN  15  13     CREA  0.77  0.58*         CA  7.4*  7.5*     PT/INR:No results for input(s): INR, INREXT in the last 72 hours.      No lab exists for component: PROTIME   APTT:No results for input(s): APTT in the last 72 hours.   LIVER PROFILE:No results for input(s): AST, ALT in the last 72 hours.      No lab exists for component: BILIDIR, BILITOT, ALKPHOS     Lab Results         Component  Value  Date/Time            ALT (SGPT)  <6 (L)  12/17/2021 10:39 AM       AST (SGOT)  15  12/17/2021 10:39 AM       Alk. phosphatase  82  12/17/2021 10:39 AM            Bilirubin, total  0.5  12/17/2021 10:39 AM           Physical Exam:   Left hip: Prevena  dressing in place.  Mild ecchymosis seen around incision site proximally.  No swelling seen left thigh.  No tenderness palpation throughout left thigh.  No calf pain to palpation.   EHL/DF/PF is 5 out of 5.  Left lower extremity neurovascularly intact.      Assessment//Plan                        Patient Active Problem List           Diagnosis  Date Noted         ?  Hip fracture ([place])  12/17/2021     ?  PAD (peripheral artery disease) ([place])  07/17/2021     ?  Severe obesity (BMI 35.0-39.9) with comorbidity ([place])  05/18/2021     ?  Chronic renal disease, stage III  01/22/2021     ?  Hypotension due to drugs  01/22/2021     ?  Depression  05/06/2020     ?  Implantable cardioverter-defibrillator (ICD) in situ  05/06/2020     ?  Permanent atrial fibrillation ([place])  05/06/2020     ?  Major depressive disorder, recurrent, mild  05/06/2020     ?  Major depressive disorder, recurrent, moderate  05/06/2020     ?  Major depressive disorder, recurrent, unspecified  05/06/2020     ?  Hypertensive heart disease  11/09/2019     ?  Mixed hyperlipidemia  11/09/2019     ?  Gastroesophageal reflux disease with esophagitis without hemorrhage  11/09/2019     ?  Parkinson disease ([place])  11/09/2019     ?  Pacemaker  11/09/2019     ?  Panlobular emphysema ([place])  11/09/2019     ?  Tobacco abuse counseling  11/09/2019     ?  Tobacco dependency  11/09/2019     ?  Fall  11/09/2019         ?  Acute pain of right shoulder  11/09/2019        Status post left hip hemiarthroplasty.  Postop day #3.   Continue with therapy as tolerated.   Spirometer and exercises encouraged   Acute on chronic blood loss anemia.   Because of his lethargy and mild hypotension will transfuse 1 unit of packed red blood cells.   Patient has been referred to discharge to skilled nursing facility.   Anticipate patient be ready for discharge by tomorrow.         Electronically signed by Moody Bruins, PA-C on 12/21/2021 at 10:04 AM

## 2021-12-21 NOTE — Progress Notes (Signed)
Progress  Notes by Larwance Sachs, Aram Beecham at 12/21/21 [phone]                Author: Larwance Sachs, Aram Beecham  Service: CASE MANAGEMENT  Author Type: Social Worker       Filed: 12/21/21 0846  Date of Service: 12/21/21 0846  Status: Signed          Editor: Larwance Sachs, Aram Beecham (Social Worker)               DC Plan: Disposition pending      PT/OT is recommending SNF. Pt will require auth. Cm to f/up with pt.

## 2021-12-21 NOTE — Progress Notes (Signed)
Progress  Notes by Wanda Plump, Caren Griffins at 12/21/21 1351                Author: Wanda Plump, Caren Griffins  Service: CASE MANAGEMENT  Author Type: Social Worker       Filed: 12/21/21 1623  Date of Service: 12/21/21 1351  Status: Addendum          Editor: Wanda Plump, Caren Griffins (Social Worker)          Related Notes: Original Note by Wanda Plump, Caren Griffins (Social Worker) filed at 12/21/21 1353               DC Plan: Home with [place] met with pt at the bedside to f/up on his dc plan. PT is recommending SNF. Pt reports [place] has been to a SNF in [place] Cm presented SNF choice list. Pt is not familiar with any SNF's in the area. Pt would like CM to call his son to discuss. Cm informed  pt writer will f/up with him once CM speaks with his son.       Cm attempted to contact pt's son Kristopher Attwood [phone]. Cm left a voice message.       [place] spoke with pt's son Corderro Koloski. Cm informed him PT's recommendation for SNF. Cm expalined to him pt only walked 19f yesterday with PT. Cm informed son we are anticiating discharge for tomororw and that if the plan is SNF we would need to obtain auth which  could take 24hrs. After much discussion with the son, the son prefers pt to return home health. Transportation was discussed. Son can provide transport if he is able to be transported in a car, if not pt will need ambulance transport. Cm informed son  CM will call him tomorrow morning to coordinate.       CM met with pt at the bedside and updated him. Pt is in agreement with home health. Choice letter signed.       Medicare pt has received, reviewed, and signed 2nd IM letter informing them of their right to appeal the discharge.  Signed copied has been placed on pt bedside chart.      Referral made via [facility]      [facility]

## 2021-12-22 LAB — CBC W/O DIFF
ABSOLUTE NRBC: 0 10*3/uL (ref 0.00–0.01)
HCT: 25.6 % — ABNORMAL LOW (ref 36.6–50.3)
HGB: 7.8 g/dL — ABNORMAL LOW (ref 12.1–17.0)
MCH: 24.8 PG — ABNORMAL LOW (ref 26.0–34.0)
MCHC: 30.5 g/dL (ref 30.0–36.5)
MCV: 81.3 FL (ref 80.0–99.0)
MPV: 8.9 FL (ref 8.9–12.9)
NRBC: 0 PER 100 WBC
PLATELET: 213 10*3/uL (ref 150–400)
RBC: 3.15 M/uL — ABNORMAL LOW (ref 4.10–5.70)
RDW: 17.6 % — ABNORMAL HIGH (ref 11.5–14.5)
WBC: 6.5 10*3/uL (ref 4.1–11.1)

## 2021-12-22 LAB — GLUCOSE, POC
Glucose (POC): 154 mg/dL — ABNORMAL HIGH (ref 65–100)
Glucose (POC): 103 mg/dL — ABNORMAL HIGH (ref 65–100)
Glucose (POC): 122 mg/dL — ABNORMAL HIGH (ref 65–100)
Glucose (POC): 111 mg/dL — ABNORMAL HIGH (ref 65–100)

## 2021-12-22 LAB — HGB & HCT
HCT: 25.2 % — ABNORMAL LOW (ref 36.6–50.3)
HGB: 7.8 g/dL — ABNORMAL LOW (ref 12.1–17.0)

## 2021-12-22 LAB — TYPE & SCREEN: ABO/Rh(D): A NEG

## 2021-12-22 LAB — POCT GLUCOSE
POC Glucose: 154 mg/dL — ABNORMAL HIGH (ref 65–100)
POC Glucose: 103 mg/dL — ABNORMAL HIGH (ref 65–100)
POC Glucose: 122 mg/dL — ABNORMAL HIGH (ref 65–100)
POC Glucose: 111 mg/dL — ABNORMAL HIGH (ref 65–100)

## 2021-12-22 LAB — HEMOGLOBIN AND HEMATOCRIT
Hematocrit: 25.2 % — ABNORMAL LOW (ref 36.6–50.3)
Hemoglobin: 7.8 g/dL — ABNORMAL LOW (ref 12.1–17.0)

## 2021-12-22 LAB — CBC
Hematocrit: 25.6 % — ABNORMAL LOW (ref 36.6–50.3)
Hemoglobin: 7.8 g/dL — ABNORMAL LOW (ref 12.1–17.0)
MCH: 24.8 PG — ABNORMAL LOW (ref 26.0–34.0)
MCHC: 30.5 g/dL (ref 30.0–36.5)
MCV: 81.3 FL (ref 80.0–99.0)
MPV: 8.9 FL (ref 8.9–12.9)
NRBC Absolute: 0 10*3/uL (ref 0.00–0.01)
Nucleated RBCs: 0 PER 100 WBC
Platelets: 213 10*3/uL (ref 150–400)
RBC: 3.15 M/uL — ABNORMAL LOW (ref 4.10–5.70)
RDW: 17.6 % — ABNORMAL HIGH (ref 11.5–14.5)
WBC: 6.5 10*3/uL (ref 4.1–11.1)

## 2021-12-22 LAB — TYPE AND SCREEN
ABO/Rh: A NEG
Antibody Screen: NEGATIVE
Unit Divison: 0

## 2021-12-22 MED FILL — ELIQUIS 2.5 MG TABLET: 2.5 mg | ORAL | Qty: 1

## 2021-12-22 MED FILL — CARVEDILOL 3.125 MG TAB: 3.125 mg | ORAL | Qty: 2

## 2021-12-22 MED FILL — GABAPENTIN 100 MG CAP: 100 mg | ORAL | Qty: 1

## 2021-12-22 MED FILL — SYMBICORT 160 MCG-4.5 MCG/ACTUATION HFA AEROSOL INHALER: RESPIRATORY_TRACT | Qty: 6

## 2021-12-22 MED FILL — AMIODARONE 200 MG TAB: 200 mg | ORAL | Qty: 1

## 2021-12-22 MED FILL — ACETAMINOPHEN 500 MG TAB: 500 mg | ORAL | Qty: 2

## 2021-12-22 MED FILL — JARDIANCE 10 MG TABLET: 10 mg | ORAL | Qty: 1

## 2021-12-22 MED FILL — CARBIDOPA-LEVODOPA ER 25-100 MG TABLET, EXTENDED RELEASE: 25-100 mg | ORAL | Qty: 4

## 2021-12-22 MED FILL — ROSUVASTATIN 5 MG TAB: 5 mg | ORAL | Qty: 4

## 2021-12-22 MED FILL — STIMULANT LAXATIVE PLUS 8.6 MG-50 MG TABLET: ORAL | Qty: 1

## 2021-12-22 MED FILL — OXYCODONE 5 MG TAB: 5 mg | ORAL | Qty: 1

## 2021-12-22 MED FILL — CELECOXIB 200 MG CAP: 200 mg | ORAL | Qty: 1

## 2021-12-22 MED FILL — FERROUS SULFATE 325 MG (65 MG ELEMENTAL IRON) TAB: 325 mg (65 mg iron) | ORAL | Qty: 1

## 2021-12-22 MED FILL — RANOLAZINE SR 500 MG 12 HR TAB: 500 mg | ORAL | Qty: 1

## 2021-12-22 MED FILL — PANTOPRAZOLE 40 MG TAB, DELAYED RELEASE: 40 mg | ORAL | Qty: 1

## 2021-12-22 NOTE — Progress Notes (Signed)
Progress Notes by Arthor Captain, COTA at 12/22/21 1010                Author: Arthor Captain, COTA  Service: Occupational Therapy  Author TypeDenman George       Filed: 12/22/21 1612  Date of Service: 12/22/21 1010  Status: Signed          Editor: Arthor Captain, COTA (COTA)               OCCUPATIONAL THERAPY TREATMENT   Patient: Carlos Petersen (71 y.o. male)   Date: 12/22/2021   Diagnosis: Hip fracture (HCC) [S72.009A] <principal problem not specified>   Procedure(s) (LRB):   HIP HEMIARTHROPLASTY ON LEFT (Left) 4 Days Post-Op   Precautions:     In place during session: Wound vac L hip and EKG/telemetry    Chart, occupational therapy assessment, plan of care, and goals were reviewed.        ASSESSMENT   Pt continues with skilled OT services and is progressing towards goals. Upon COTA arrival, pt sitting in recliner and agreeable to tx session at this time.  Overall, pt continues to present with deficits  in generalized strength/AROM, coordination, bed mobility, static/dynamic sitting and standing balance and functional activity tolerance during performance of ADLs/mobility (see below for objective details and assist levels). Pt tolerated session fair-poor  this date with completion of transfers and therex.  Pt noted to be limited at this time due to fatigue.  Pt able to complete sit>stand from recliner with less assist this date, but once in standing pt noted with R lateral lean requiring increased assist  to maintain upright standing balance.  Pt declining further ambulation or progressing transfers at this time due to fatigue.  Pt able to recall and complete few reps of UE therex (see chart below for details), pt noted with limited ROM in RUE shoulder  due to pt reporting having a prior diagnosis of "bursitis".  Pt educated on proper therex form and pt verbalized understanding.  Pt educated on current level of function and increased risk for falls and functional decline.  Pt now agreeable to SNF  for  rehab, discussed with CM.  Recommend d/c to Skilled Nursing Facility once medically appropriate.      Other factors to consider for discharge: family/social support, DME, time since onset, severity of deficits, decline from functional baseline               PLAN :   Patient continues to benefit from skilled intervention to address the above impairments.  Continue treatment per established plan of care to address goals.      Recommendation for discharge: (in order for the patient to meet his/her long term  goals)   Skilled Nursing Facility      This discharge recommendation:   Has been made in collaboration with the attending provider and/or case management      IF patient discharges home will need the following DME: TBD             SUBJECTIVE:     Patient stated I don't think I can do anymore today, my legs just feel so weak.        OBJECTIVE DATA SUMMARY:     Cognitive/Behavioral Status:   Neurologic State: Alert       Cognition: Appropriate safety awareness;Appropriate decision making      Functional Mobility and Transfers for ADLs:      Transfers:   Sit to Stand: Minimum assistance;Assist  x2      Balance:   Sitting: Intact;With support   Standing - Static: Poor;Constant support   Standing - Dynamic : Poor;Constant support      Therapeutic Exercises:             Exercise  Sets  Reps  AROM  AAROM  PROM  Self PROM  Comments              Shoulder flex/ext  1  4  [x]    LUE  [x]    RUE  []   []                 Elbow flex/ext  1  5  [x]   []   []   []           Pain:   0/10      Activity Tolerance:    Fair, Poor, and requires rest breaks   Please refer to the flowsheet for vital signs taken during this treatment.      After treatment patient left in no apparent distress:    Bed locked and in lowest position  Sitting in chair and Call bell within reach        COMMUNICATION/COLLABORATION:     The patients plan of care was discussed with: Physical therapy assistant and Registered nurse.       Hailey Bernshausen, COTA   Time  Calculation: 23 mins      Problem: Self Care Deficits Care Plan (Adult)   Goal: *Acute Goals and Plan of Care (Insert Text)   Description:   FUNCTIONAL STATUS PRIOR TO ADMISSION: Patient required minimal assistance for basic and instrumental ADLs.     HOME  SUPPORT: The patient lived with his son and required minimal assistance/contact guard assist for ADLs.    Occupational Therapy Goals  Initiated 12/19/2021  Patient Goal: Go to rehab and be able to walk to the bathroom.  1.  Patient will  perform lower body dressing with modified independence within 7 day(s).  2.  Patient will perform grooming with modified independence within 7 day(s).  3.  Patient will perform bathing with modified independence within 7 day(s).  4.  Patient  will perform toilet transfers with modified independence within 7 day(s).  5.  Patient will perform all aspects of toileting with modified independence within 7 day(s).  6.  Patient will participate in upper extremity therapeutic exercise/activities  with independence within 7 day(s).    7.  Patient will utilize energy conservation techniques during functional activities with verbal cues within 7 day(s).   Outcome: Progressing Towards Goal

## 2021-12-22 NOTE — Progress Notes (Signed)
Progress Notes by Elberta Spaniel, [place] at 12/22/21 1610                Author: Elberta Spaniel, PA  Service: Physician Assistant  Author Type: Physician Assistant       Filed: 12/22/21 1417  Date of Service: 12/22/21 0828  Status: Signed           Editor: Elberta Spaniel, [place] (Physician Assistant)  Cosigner: Otho Najjar, MD at 12/22/21 1454                 Hospitalist Progress Note                    Daily Progress Note: 12/22/2021 8:14 AM     Hospital course:     Carlos Petersen is a 71 y.o. male past medical history of COPD, diabetes, heart disease, hypertension, hyperlipidemia,  Parkinson's disease, pulmonary embolism on anticoagulation that presented to the ED on 12/17/2021 after sustaining ground-level fall 2 days prior.   In the ED x-ray of the femur and pelvis showed an acute mildly displaced subcapital fracture of the left femoral neck.  Laboratory data showed a hemoglobin 8.1.  Vital signs stable.   Requested admission for ORIF of the left hip. Ortho consulted. left ORIF performed on 12/18/2021. Cardiology consulted for clearance. ECHO shows EF 60-65%.  Patient's hemoglobin is 4.3.  Unsure of the accuracy of the results however he was  was transfused  1 unit packed red blood cells.  There is only 200 cc blood loss per operative note.  Iron studies revealed low iron; low TIBC, and low Iron% saturation. Patient is pending authorization for SNF.         Subjective:        Seen and examined at bedside. Reports fatigue and dizziness, but notes improvement in hip pain. Denies other complaints at this time.      Originally was going home with home health; however, he now wants SNF. Authorization is pending [facility])        Assessment/Plan:     Active Problems:     Hip fracture ([place]) (12/17/2021)      Acute mildly displaced subcapital fracture of the left femoral neck s/p ORIF #3   -- X-rays of the left hip and pelvis shows left femoral neck fracture.     -- Orthopedics consulted and performed surgery on  04/221/2023.   --PT OT recommending SNF  (auth pending)   --Tylenol, tramadol, IV morphine as needed for pain.          Heart disease/hypertension   -- EKG NSR with 1st degree AV block.     --2D echo with  normal EF.     -- Continue Cardiac telemetry.     --Cardiology gave clearance for surgery     --Chest x-ray no acute process.     --Patient on amiodarone 225m daily, Coreg 6.25 mg twice daily, Ranexa 500 mg twice daily.        Hyperlipidemia   --Continue Crestor 20 mg at bedtime      Type 2 diabetes   --Continue with Jardiance 10 mg daily.  Insulin sliding scale.      COPD   --Oxygen saturation within normal limits on room air.     --CXR reveals no acute disease   --Symbicort/Spiriva.     --Albuterol as needed      Parkinson's disease   --Continue with Sinemet 3 times a day  History of PE   --hold patient's Eliquis.     --Patient is not hypoxic.     --Will resume when HGB stabilizes      Chronic anemia   -- S/P 1 unit packed red blood cells on 12/18/2021    --Iron studies revealed low iron; low TIBC, and low Iron% saturation.     --If Hgb drops below 7 transfuse   --continue to monitor         DVT Prophylaxis:held secondary to anemia   Code Status: DNR   POA/NOK:      Medical Decision Making:      Labs reviewed by myself: CBC,BMP      Diagnostic data reviewed by myself: CXR, XR LT Hip      Toxic drug monitoring:  insulin and other diabetes medications for signs/symptoms of hypoglycemia      Discussed case with Darrelyn Hillock, PA-C      MDM Discussion: Patient with numerous medical comorbidities, each with increased risk for mortality and morbidity if left untreated.  Patient requires medications with high risk of toxicity and need  for intensive monitoring.  I have reviewed patient's presenting subjective and objective findings, as well as all laboratory studies, imaging studies, and vital signs to date as well as treatment rendered and patient's response to those treatments.  In  addition, prior medical,  surgical and relevant social and family histories were reviewed.          Disposition and discharge barriers:      Placement; ortho clearance; stabilization of Hgb   Care Plan discussed with: Patient, nursing, case management        Current Facility-Administered Medications          Medication  Dose  Route  Frequency           ?  apixaban (ELIQUIS) tablet 2.5 mg   2.5 mg  Oral  Q12H           ?  [START ON 12/27/2021] apixaban (ELIQUIS) tablet 5 mg   5 mg  Oral  Q12H     ?  ferrous sulfate tablet 325 mg   1 Tablet  Oral  DAILY WITH BREAKFAST     ?  0.9% sodium chloride infusion 250 mL   250 mL  IntraVENous  PRN     ?  oxyCODONE IR (ROXICODONE) tablet 5 mg   5 mg  Oral  Q4H PRN     ?  0.9% sodium chloride infusion   50 mL/hr  IntraVENous  CONTINUOUS     ?  0.9% sodium chloride infusion 250 mL   250 mL  IntraVENous  PRN     ?  sodium chloride (NS) flush 5-40 mL   5-40 mL  IntraVENous  Q8H     ?  acetaminophen (TYLENOL) tablet 1,000 mg   1,000 mg  Oral  Q6H     ?  oxyCODONE IR (ROXICODONE) tablet 5 mg   5 mg  Oral  Q4H PRN     ?  oxyCODONE IR (ROXICODONE) tablet 10 mg   10 mg  Oral  Q4H PRN     ?  naloxone (NARCAN) injection 0.4 mg   0.4 mg  IntraVENous  PRN     ?  senna-docusate (PERICOLACE) 8.6-50 mg per tablet 1 Tablet   1 Tablet  Oral  BID     ?  0.9% sodium chloride infusion 250 mL   250 mL  IntraVENous  PRN     ?  albuterol (PROVENTIL HFA, VENTOLIN HFA, PROAIR HFA) inhaler 2 Puff   2 Puff  Inhalation  Q6H PRN     ?  amiodarone (CORDARONE) tablet 200 mg   200 mg  Oral  DAILY           ?  carbidopa-levodopa ER (SINEMET CR) 25-100 mg per tablet 4 Tablet   4 Tablet  Oral  TID           ?  carvediloL (COREG) tablet 6.25 mg   6.25 mg  Oral  BID     ?  empagliflozin (JARDIANCE) tablet 10 mg   10 mg  Oral  DAILY     ?  budesonide-formoteroL (SYMBICORT) 160-4.5 mcg/actuation HFA inhaler 1 Puff   1 Puff  Inhalation  BID RT          And           ?  tiotropium bromide (SPIRIVA RESPIMAT) 2.5 mcg /actuation   2 Puff   Inhalation  DAILY     ?  gabapentin (NEURONTIN) capsule 100 mg   100 mg  Oral  BID     ?  pantoprazole (PROTONIX) tablet 40 mg   40 mg  Oral  DAILY     ?  ranolazine ER (RANEXA) tablet 500 mg   500 mg  Oral  BID     ?  rosuvastatin (CRESTOR) tablet 20 mg   20 mg  Oral  QHS     ?  sodium chloride (NS) flush 5-40 mL   5-40 mL  IntraVENous  PRN     ?  acetaminophen (TYLENOL) tablet 650 mg   650 mg  Oral  Q6H PRN     ?  polyethylene glycol (MIRALAX) packet 17 g   17 g  Oral  DAILY PRN     ?  ondansetron (ZOFRAN ODT) tablet 4 mg   4 mg  Oral  Q8H PRN          Or           ?  ondansetron (ZOFRAN) injection 4 mg   4 mg  IntraVENous  Q6H PRN     ?  glucose chewable tablet 16 g   4 Tablet  Oral  PRN     ?  glucagon (GLUCAGEN) injection 1 mg   1 mg  IntraMUSCular  PRN     ?  dextrose 10% infusion 0-250 mL   0-250 mL  IntraVENous  PRN     ?  insulin lispro (HUMALOG) injection     SubCUTAneous  AC&HS           ?  celecoxib (CELEBREX) capsule 200 mg   200 mg  Oral  BID            REVIEW OF SYSTEMS      Review of Systems    Constitutional:  Positive for malaise/fatigue.    Eyes:  Negative for blurred vision and double vision.    Respiratory:  Negative for cough, shortness of breath and wheezing.     Cardiovascular:  Negative for chest pain, palpitations and leg swelling.    Gastrointestinal:  Negative for abdominal pain, heartburn and nausea.    Musculoskeletal:  Positive for joint pain (L. Hip) and  myalgias.    Neurological:  Positive for dizziness. Negative for headaches.          Objective:        Visit Vitals      BP  110/60 (  BP 1 Location: Right upper arm, BP Patient Position: At rest;Semi fowlers)     Pulse  72     Temp  98.1 F (36.7 C)     Resp  18     Ht  '5\' 11"'  (1.803 m)     Wt  90.1 kg (198 lb 10.2 oz)     SpO2  100%        BMI  27.70 kg/m      O2 Flow Rate (L/min): 1 l/min O2 Device: None (Room air)      Temp (24hrs), Avg:97.9 F (36.6 C), Min:97.4 F (36.3 C), Max:98.6 F (37 C)            PHYSICAL EXAM:       Physical Exam   Vitals and nursing note reviewed.    Constitutional:        General: He is not in acute distress.      Appearance: He is ill-appearing.    HENT:       Head: Normocephalic and atraumatic.    Cardiovascular:       Rate and Rhythm: Normal rate and regular rhythm.       Heart sounds: No murmur heard.   Pulmonary:       Effort: Pulmonary effort is normal. No respiratory distress.       Breath sounds: Rhonchi present.     Abdominal:       General: Abdomen is flat. There is no distension.       Palpations: Abdomen is soft.       Tenderness: There is no abdominal tenderness.     Musculoskeletal:          General: No tenderness.       Comments: Dressing in place       Skin:      General: Skin is warm.    Neurological:       Mental Status: He is alert. Mental status is at baseline.           Data Review        Recent Results (from the past 24 hour(s))     GLUCOSE, POC          Collection Time: 12/21/21  9:42 AM         Result  Value  Ref Range            Glucose (POC)  123 (H)  65 - 100 mg/dL       Performed by  [place], BASIC          Collection Time: 12/21/21 10:40 AM         Result  Value  Ref Range            Sodium  134 (L)  136 - 145 mmol/L       Potassium  3.4 (L)  3.5 - 5.1 mmol/L       Chloride  106  97 - 108 mmol/L       CO2  23  21 - 32 mmol/L       Anion gap  5  5 - 15 mmol/L       Glucose  96  65 - 100 mg/dL       BUN  16  6 - 20 mg/dL       Creatinine  0.92  0.70 - 1.30 mg/dL       BUN/Creatinine ratio  17  12 - 20         eGFR  >60  >60 ml/min/1.53m       Calcium  7.5 (L)  8.5 - 10.1 mg/dL       CBC WITH AUTOMATED DIFF          Collection Time: 12/21/21 10:40 AM         Result  Value  Ref Range            WBC  9.2  4.1 - 11.1 K/uL       RBC  3.18 (L)  4.10 - 5.70 M/uL       HGB  7.7 (L)  12.1 - 17.0 g/dL       HCT  26.3 (L)  36.6 - 50.3 %       MCV  82.7  80.0 - 99.0 FL       MCH  24.2 (L)  26.0 - 34.0 PG       MCHC  29.3 (L)  30.0 - 36.5 g/dL       RDW  17.3 (H)   11.5 - 14.5 %       PLATELET  223  150 - 400 K/uL       MPV  9.1  8.9 - 12.9 FL       NRBC  0.0  0.0 PER 100 WBC       ABSOLUTE NRBC  0.00  0.00 - 0.01 K/uL       NEUTROPHILS  63  32 - 75 %       LYMPHOCYTES  20  12 - 49 %       MONOCYTES  12  5 - 13 %       EOSINOPHILS  2  0 - 7 %       BASOPHILS  1  0 - 1 %       IMMATURE GRANULOCYTES  2 (H)  0 - 0.5 %       ABS. NEUTROPHILS  5.8  1.8 - 8.0 K/UL       ABS. LYMPHOCYTES  1.8  0.8 - 3.5 K/UL       ABS. MONOCYTES  1.1 (H)  0.0 - 1.0 K/UL       ABS. EOSINOPHILS  0.2  0.0 - 0.4 K/UL       ABS. BASOPHILS  0.1  0.0 - 0.1 K/UL       ABS. IMM. GRANS.  0.2 (H)  0.00 - 0.04 K/UL       DF  AUTOMATED          TYPE & SCREEN          Collection Time: 12/21/21 10:40 AM         Result  Value  Ref Range            Crossmatch Expiration  12/24/2021,2359         ABO/Rh(D)  A Negative         Antibody screen  Negative         Unit number  [id number]        Blood component type  RC LR         Unit division  00         Status of unit  Issued,final         TRANSFUSION STATUS  Ok to transfuse         Crossmatch result  Compatible  GLUCOSE, POC          Collection Time: 12/21/21 11:27 AM         Result  Value  Ref Range            Glucose (POC)  110 (H)  65 - 100 mg/dL       Performed by  Teressa Senter         GLUCOSE, POC          Collection Time: 12/21/21  3:51 PM         Result  Value  Ref Range            Glucose (POC)  118 (H)  65 - 100 mg/dL       Performed by  Duanne Limerick         HGB & HCT          Collection Time: 12/21/21  8:27 PM         Result  Value  Ref Range            HGB  7.8 (L)  12.1 - 17.0 g/dL       HCT  25.2 (L)  36.6 - 50.3 %       GLUCOSE, POC          Collection Time: 12/21/21  9:39 PM         Result  Value  Ref Range            Glucose (POC)  154 (H)  65 - 100 mg/dL       Performed by  Barnett Applebaum         CBC W/O DIFF          Collection Time: 12/22/21  5:48 AM         Result  Value  Ref Range            WBC  6.5  4.1 - 11.1 K/uL       RBC  3.15 (L)   4.10 - 5.70 M/uL       HGB  7.8 (L)  12.1 - 17.0 g/dL       HCT  25.6 (L)  36.6 - 50.3 %       MCV  81.3  80.0 - 99.0 FL       MCH  24.8 (L)  26.0 - 34.0 PG       MCHC  30.5  30.0 - 36.5 g/dL       RDW  17.6 (H)  11.5 - 14.5 %       PLATELET  213  150 - 400 K/uL       MPV  8.9  8.9 - 12.9 FL       NRBC  0.0  0.0 PER 100 WBC       ABSOLUTE NRBC  0.00  0.00 - 0.01 K/uL       GLUCOSE, POC          Collection Time: 12/22/21  7:26 AM         Result  Value  Ref Range            Glucose (POC)  103 (H)  65 - 100 mg/dL            Performed by  Duanne Limerick               DUPLEX LOWER EXT VENOUS BILAT       Final Result  XR HIP LT W OR WO PELV 2-3 VWS       Final Result     Left hip hemiarthroplasty with expected immediate postoperative appearance.            XR CHEST PORT       Final Result     Stable minimal atelectasis or scar. Stable right midlung field parenchymal     opacity. No new acute disease.                 XR FEMUR LT 2 V       Final Result          1. Acute, mildly displaced subcapital fracture of the LEFT femoral neck.          2. No additional acute fracture of the LEFT femur is identified.            XR PELV AP ONLY       Final Result          1. Acute, mildly displaced subcapital fracture of the LEFT femoral neck.          2. No additional acute fracture of the LEFT femur is identified.                  Intake and Output:   Current Shift: No intake/output data recorded.   Last three shifts: 04/23 1901 - 04/25 0700   In: 513.9 [P.O.:200]   Out: 1500 [Urine:1500]         Lab/Data Review:     Recent Labs              12/22/21   0548  12/21/21   2027  12/21/21   1040  12/20/21   0720     WBC  6.5   --   9.2  6.8     HGB  7.8*  7.8*  7.7*  7.2*     HCT  25.6*  25.2*  26.3*  23.8*           PLT  213   --   223  185             Recent Labs            12/21/21   1040  12/20/21   0720     NA  134*  136     K  3.4*  4.2     CL  106  108     CO2  23  22     GLU  96  103*     BUN  16  15     CREA  0.92  0.77          CA  7.5*  7.4*           No results for input(s): PH, PCO2, PO2, HCO3, FIO2 in the last 72 hours.     Recent Results (from the past 24 hour(s))     GLUCOSE, POC          Collection Time: 12/21/21  9:42 AM         Result  Value  Ref Range            Glucose (POC)  123 (H)  65 - 100 mg/dL       Performed by  [place], BASIC          Collection Time: 12/21/21  10:40 AM         Result  Value  Ref Range            Sodium  134 (L)  136 - 145 mmol/L       Potassium  3.4 (L)  3.5 - 5.1 mmol/L       Chloride  106  97 - 108 mmol/L       CO2  23  21 - 32 mmol/L       Anion gap  5  5 - 15 mmol/L       Glucose  96  65 - 100 mg/dL       BUN  16  6 - 20 mg/dL       Creatinine  0.92  0.70 - 1.30 mg/dL       BUN/Creatinine ratio  17  12 - 20         eGFR  >60  >60 ml/min/1.71m       Calcium  7.5 (L)  8.5 - 10.1 mg/dL       CBC WITH AUTOMATED DIFF          Collection Time: 12/21/21 10:40 AM         Result  Value  Ref Range            WBC  9.2  4.1 - 11.1 K/uL       RBC  3.18 (L)  4.10 - 5.70 M/uL       HGB  7.7 (L)  12.1 - 17.0 g/dL       HCT  26.3 (L)  36.6 - 50.3 %       MCV  82.7  80.0 - 99.0 FL       MCH  24.2 (L)  26.0 - 34.0 PG       MCHC  29.3 (L)  30.0 - 36.5 g/dL       RDW  17.3 (H)  11.5 - 14.5 %       PLATELET  223  150 - 400 K/uL       MPV  9.1  8.9 - 12.9 FL       NRBC  0.0  0.0 PER 100 WBC       ABSOLUTE NRBC  0.00  0.00 - 0.01 K/uL       NEUTROPHILS  63  32 - 75 %       LYMPHOCYTES  20  12 - 49 %       MONOCYTES  12  5 - 13 %       EOSINOPHILS  2  0 - 7 %       BASOPHILS  1  0 - 1 %       IMMATURE GRANULOCYTES  2 (H)  0 - 0.5 %       ABS. NEUTROPHILS  5.8  1.8 - 8.0 K/UL       ABS. LYMPHOCYTES  1.8  0.8 - 3.5 K/UL       ABS. MONOCYTES  1.1 (H)  0.0 - 1.0 K/UL       ABS. EOSINOPHILS  0.2  0.0 - 0.4 K/UL       ABS. BASOPHILS  0.1  0.0 - 0.1 K/UL       ABS. IMM. GRANS.  0.2 (H)  0.00 - 0.04 K/UL       DF  AUTOMATED          TYPE & SCREEN  Collection Time: 12/21/21 10:40 AM          Result  Value  Ref Range            Crossmatch Expiration  12/24/2021,2359         ABO/Rh(D)  A Negative         Antibody screen  Negative              Unit number  [id number]              Blood component type  RC LR         Unit division  00         Status of unit  Issued,final         TRANSFUSION STATUS  Ok to transfuse         Crossmatch result  Compatible         GLUCOSE, POC          Collection Time: 12/21/21 11:27 AM         Result  Value  Ref Range            Glucose (POC)  110 (H)  65 - 100 mg/dL       Performed by  Teressa Senter         GLUCOSE, POC          Collection Time: 12/21/21  3:51 PM         Result  Value  Ref Range            Glucose (POC)  118 (H)  65 - 100 mg/dL       Performed by  Duanne Limerick         HGB & HCT          Collection Time: 12/21/21  8:27 PM         Result  Value  Ref Range            HGB  7.8 (L)  12.1 - 17.0 g/dL       HCT  25.2 (L)  36.6 - 50.3 %       GLUCOSE, POC          Collection Time: 12/21/21  9:39 PM         Result  Value  Ref Range            Glucose (POC)  154 (H)  65 - 100 mg/dL       Performed by  Barnett Applebaum         CBC W/O DIFF          Collection Time: 12/22/21  5:48 AM         Result  Value  Ref Range            WBC  6.5  4.1 - 11.1 K/uL       RBC  3.15 (L)  4.10 - 5.70 M/uL       HGB  7.8 (L)  12.1 - 17.0 g/dL       HCT  25.6 (L)  36.6 - 50.3 %       MCV  81.3  80.0 - 99.0 FL       MCH  24.8 (L)  26.0 - 34.0 PG       MCHC  30.5  30.0 - 36.5 g/dL       RDW  17.6 (H)  11.5 - 14.5 %  PLATELET  213  150 - 400 K/uL            MPV  8.9  8.9 - 12.9 FL       NRBC  0.0  0.0 PER 100 WBC       ABSOLUTE NRBC  0.00  0.00 - 0.01 K/uL       GLUCOSE, POC          Collection Time: 12/22/21  7:26 AM         Result  Value  Ref Range            Glucose (POC)  103 (H)  65 - 100 mg/dL            Performed by  Duanne Limerick                     _____________________________________________________________________________   Time spent in direct care including  coordination of service, review of data and examination: 36 minutes      ______________________________________________________________________________      Elberta Spaniel, PA      This is dictation was done by dragon, computer voice recognition software.  Quite often unanticipated grammatical, syntax, homophones and other interpretive errors or inadvertently transcribed by the computer software.  Please excuse errors that have  escaped final proofreading.  Thank you.

## 2021-12-22 NOTE — Progress Notes (Signed)
Patient is doing well.  He has been able to move around.  I looked at his dressing.  He has no drainage whatsoever.  No calf tenderness bilaterally.  Neurovascular intact both feet.  Plan is for weightbearing both lower limbs with a walker and help.  Follow-up 2 weeks at the orthopedic clinic.  Keep the wound covered at all times.  Do not wet the wound.  DVT prophylaxis per the attending.

## 2021-12-22 NOTE — Progress Notes (Signed)
DC Plan: [facility]    Cm was informed pt would like to go to SNF.     Cm spoke with pt's son Treshon Stannard [phone]. Son indicated pt is interested in going to the SNF in [facility]     Cm met with pt at the bedside. Pt was on the phone with his son. Choice obtained for [facility]. Referral made via [facility]. Cm asked [facility] to start auth.

## 2021-12-22 NOTE — Progress Notes (Signed)
Progress Notes by Theresia BoughSidell, Madina Galati F, NP at 12/22/21 1515                Author: Theresia BoughSidell, Daliyah Sramek F, NP  Service: Nurse Practitioner  Author Type: Nurse Practitioner       Filed: 12/22/21 1516  Date of Service: 12/22/21 1515  Status: Attested           Editor: Theresia BoughSidell, Moriyah Byington F, NP (Nurse Practitioner)  Cosigner: Robley FriesNiazi, Kareem, MD at 12/22/21 1520          Attestation signed by Robley FriesNiazi, Kareem, MD at 12/22/21 1520          I have seen and examined patient and agree with the assesment and plan as documented by Yvonna Alanisori Eleesha Purkey, NP. Examination findings notable for stable vital signs  and exam.  Plan is to continue with current cardiac medications and work up, as well as other items noted in above.                                     CARDIOLOGY PROGESS NOTE         Carlos Petersen is a 71 y.o. year-old male with past medical history significant for PE, COPD, HLD, Parkinson's, and DM who was evaluated on 4/20 for preop cardiac evaluation prior to Left hip surgery due to femoral neck fracture from fall.      Patient seen and examined. He underwent left hemiarthroplasty Friday, uncomplicated. Up in the chair, pain fairly well controlled.  Breathing stable. No report of chest pain.       Records from hospital admission course thus far reviewed.       Telemetry reviewed.       INPATIENT MEDICATIONS:  Home medications reviewed.     Current Facility-Administered Medications:    ?  apixaban (ELIQUIS) tablet 2.5 mg, 2.5 mg, Oral, Q12H, Young, Chevin L, PA-C, 2.5 mg at 12/22/21 [id number]   ?  [START ON 12/27/2021] apixaban (ELIQUIS) tablet 5 mg, 5 mg, Oral, Q12H, Young, Chevin L, PA-C   ?  ferrous sulfate tablet 325 mg, 1 Tablet, Oral, DAILY WITH BREAKFAST, [place], [place], [place], 325 mg at 12/22/21 [id number]   ?  0.9% sodium chloride infusion 250 mL, 250 mL, IntraVENous, PRN, Young, Chevin L, PA-C   ?  oxyCODONE IR (ROXICODONE) tablet 5 mg, 5 mg, Oral, Q4H PRN, Young, Chevin L, PA-C, 5 mg at 12/21/21 2020   ?  0.9% sodium chloride  infusion, 50 mL/hr, IntraVENous, CONTINUOUS, Young, Chevin L, PA-C, Last Rate: 50 mL/hr at 12/20/21 0447, 50 mL/hr  at 12/20/21 0447   ?  0.9% sodium chloride infusion 250 mL, 250 mL, IntraVENous, PRN, Hope, Karrie Doffingobert L Jr., MD   ?  sodium chloride (NS) flush 5-40 mL, 5-40 mL, IntraVENous, Q8H, Benaissa, Rafik, MD, 10 mL at 12/22/21 1357   ?  acetaminophen (TYLENOL) tablet 1,000 mg, 1,000 mg, Oral, Q6H, Benaissa, Rafik, MD, 1,000 mg at 12/22/21 1148   ?  oxyCODONE IR (ROXICODONE) tablet 5 mg, 5 mg, Oral, Q4H PRN, Smith MinceBenaissa, Rafik, MD, 5 mg at 12/19/21 1958   ?  oxyCODONE IR (ROXICODONE) tablet 10 mg, 10 mg, Oral, Q4H PRN, Polo RileyBenaissa, Rafik, MD, 10 mg at 12/20/21 0840   ?  naloxone (NARCAN) injection 0.4 mg, 0.4 mg, IntraVENous, PRN, Polo RileyBenaissa, Rafik, MD   ?  senna-docusate (PERICOLACE) 8.6-50 mg per tablet 1 Tablet, 1 Tablet, Oral, BID, Smith MinceBenaissa, Rafik, MD,  1 Tablet at 12/22/21 0924   ?  0.9% sodium chloride infusion 250 mL, 250 mL, IntraVENous, PRN, Tegegn, Nahom, MD   ?  albuterol (PROVENTIL HFA, VENTOLIN HFA, PROAIR HFA) inhaler 2 Puff, 2 Puff, Inhalation, Q6H PRN, Bishop, Krysten, PA-C   ?  amiodarone (CORDARONE) tablet 200 mg, 200 mg, Oral, DAILY, Bishop, Krysten, PA-C, 200 mg at 12/22/21 7408   ?  carbidopa-levodopa ER (SINEMET CR) 25-100 mg per tablet 4 Tablet, 4 Tablet, Oral, TID, Bishop, Krysten, PA-C, 4 Tablet at 12/22/21 1146   ?  carvediloL (COREG) tablet 6.25 mg, 6.25 mg, Oral, BID, Bishop, Krysten, PA-C, 6.25 mg at 12/22/21 1448   ?  empagliflozin (JARDIANCE) tablet 10 mg, 10 mg, Oral, DAILY, Bishop, Krysten, PA-C, 10 mg at 12/22/21 1856   ?  budesonide-formoteroL (SYMBICORT) 160-4.5 mcg/actuation HFA inhaler 1 Puff, 1 Puff, Inhalation, BID RT, 1 Puff at 12/22/21 0847 **AND**  tiotropium bromide (SPIRIVA RESPIMAT) 2.5 mcg /actuation, 2 Puff, Inhalation, DAILY, Bishop, [place], PA-C, 2 Puff at 12/22/21 3149   ?  gabapentin (NEURONTIN) capsule 100 mg, 100 mg, Oral, BID, Bishop, Krysten, PA-C, 100 mg at 12/22/21  7026   ?  pantoprazole (PROTONIX) tablet 40 mg, 40 mg, Oral, DAILY, Bishop, Krysten, PA-C, 40 mg at 12/22/21 3785   ?  ranolazine ER (RANEXA) tablet 500 mg, 500 mg, Oral, BID, Bishop, Krysten, PA-C, 500 mg at 12/22/21 8850   ?  rosuvastatin (CRESTOR) tablet 20 mg, 20 mg, Oral, QHS, Bishop, Krysten, PA-C, 20 mg at 12/21/21 2150   ?  sodium chloride (NS) flush 5-40 mL, 5-40 mL, IntraVENous, PRN, Bishop, Krysten, PA-C   ?  acetaminophen (TYLENOL) tablet 650 mg, 650 mg, Oral, Q6H PRN **OR** [DISCONTINUED] acetaminophen (TYLENOL) suppository 650 mg, 650 mg,  Rectal, Q6H PRN, Bishop, Krysten, PA-C   ?  polyethylene glycol (MIRALAX) packet 17 g, 17 g, Oral, DAILY PRN, Bishop, Krysten, PA-C   ?  ondansetron (ZOFRAN ODT) tablet 4 mg, 4 mg, Oral, Q8H PRN **OR** ondansetron (ZOFRAN) injection 4 mg, 4 mg, IntraVENous, Q6H PRN, Bishop,  Krysten, PA-C, 4 mg at 12/19/21 2206   ?  glucose chewable tablet 16 g, 4 Tablet, Oral, PRN, Bishop, Krysten, PA-C   ?  glucagon (GLUCAGEN) injection 1 mg, 1 mg, IntraMUSCular, PRN, Bishop, Krysten, PA-C   ?  dextrose 10% infusion 0-250 mL, 0-250 mL, IntraVENous, PRN, Bishop, Krysten, PA-C   ?  insulin lispro (HUMALOG) injection, , SubCUTAneous, AC&HS, Bishop, Krysten, PA-C   ?  celecoxib (CELEBREX) capsule 200 mg, 200 mg, Oral, BID, Young, Chevin L, PA-C, 200 mg at 12/22/21 2774       ALLERGIES:  Allergies reviewed with the patient,No Known Allergies .      REVIEW OF SYSTEMS:  Complete review of systems performed, pertinents noted above, all other systems are negative.      Physical examination :   No apparent distress.   Heart is regular, rate and rhythm. Normal S1, S2, no murmurs are appreciated.   Lungs are clear bilaterally.   Abdomen is soft, nontender, normal bowel sounds.   Extremities have no edema.      Visit Vitals      BP  124/62 (BP 1 Location: Right upper arm, BP Patient Position: At rest)        Pulse  72     Temp  97.8 F (36.6 C)     Resp  18     Ht  5\' 11"  (1.803 m)  Wt   90.1 kg (198 lb 10.2 oz)     SpO2  98%        BMI  27.70 kg/m           Recent labs results and imaging reviewed.       Impression and plan as discussed with Dr. Dillon Bjork   1.  S/p left hip hemiarthroplasty, uncomplicated.    2.  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 Recent echo with EF 55%  Continue amiodarone. Occasional PVCs on tele   3.  History of PE,  Eliquis resumed per Ortho, closely monitor H/H    4.  COPD: Primary following.       Please do not hesitate to call me if additional questions arise.      Theresia Bough, NP   12/22/2021

## 2021-12-22 NOTE — Progress Notes (Signed)
Progress Notes by  Gasper Sells at 12/22/21 1449                Author: Gasper Sells  Service: --  Author Type: Registered Nurse       Filed: 12/22/21 1450  Date of Service: 12/22/21 1449  Status: Signed          Editor: Gasper Sells (Registered Nurse)                  Problem: Pain   Goal: *Control of Pain   Outcome: Progressing Towards Goal       Problem: Discharge Planning   Goal: *Discharge to safe environment   Outcome: Progressing Towards Goal   Goal: *Knowledge of medication management   Outcome: Progressing Towards Goal   Goal: *Knowledge of discharge instructions   Outcome: Progressing Towards Goal       Problem: Falls - Risk of   Goal: *Absence of Falls   Description: Document Bridgette Habermann Fall Risk and appropriate interventions in the flowsheet.   Outcome: Progressing Towards Goal   Note: Fall Risk Interventions:                 Medication Interventions: Patient to call before getting OOB      Elimination Interventions: Call light in reach, Patient to call for help with toileting needs                    Problem: Pressure Injury - Risk of   Goal: *Prevention of pressure injury   Description: Document Braden Scale and appropriate interventions in the flowsheet.   Outcome: Progressing Towards Goal   Note: Pressure Injury Interventions:   Sensory Interventions: Assess changes in LOC, Keep linens dry and wrinkle-free, Float heels, Pressure redistribution bed/mattress (bed type), Monitor skin under medical devices      Moisture Interventions: Absorbent underpads      Activity Interventions: Increase time out of bed, Pressure redistribution bed/mattress(bed type), PT/OT evaluation      Mobility Interventions: HOB 30 degrees or less, Pressure redistribution bed/mattress (bed type), PT/OT evaluation      Nutrition Interventions: Document food/fluid/supplement intake                              Problem: Patient Education: Go to Patient Education Activity   Goal: Patient/Family Education   Outcome: Progressing Towards  Goal

## 2021-12-22 NOTE — Progress Notes (Signed)
[id number]  Bedside report given to Bing Neighbors, RN by Blenda Nicely, RN. Report included SBAR, ED Report, Labs, and Cardiac Rhythm A Paced.  Patient in bed resting well.  Vitals are stable.

## 2021-12-22 NOTE — Progress Notes (Signed)
Bedside, Verbal, and Written shift change report given to Sara RN (oncoming nurse) by Selfa RN (offgoing nurse). Report included the following information SBAR.

## 2021-12-22 NOTE — Progress Notes (Signed)
Problem: Mobility Impaired (Adult and Pediatric)  Goal: *Acute Goals and Plan of Care (Insert Text)  Description: FUNCTIONAL STATUS PRIOR TO ADMISSION: Patient required minimal assistance for basic and instrumental ADLs.    HOME SUPPORT PRIOR TO ADMISSION: The patient lived with son and required minimal assistance/contact guard assist for ADLs.    Patient stated goal: get better  Patient will move from supine to sit and sit to supine , scoot up and down, and roll side to side in bed with minimal assistance/contact guard assist within 7 day(s).    Patient will transfer from bed to chair and chair to bed with minimal assistance/contact guard assist using the least restrictive device within 7 day(s).    Patient will improve static standing balance to minimal assistance within 1 week(s).  Patient will ambulate 30 feet with minimal assistance with least restrictive device within 1 weeks.   WBAT LLE  Outcome: Progressing Towards Goal   PHYSICAL THERAPY TREATMENT  Patient: Carlos Petersen (71 y.o. male)  Date: 12/22/2021  Diagnosis: Hip fracture (HCC) [S72.009A] <principal problem not specified>  Procedure(s) (LRB):  HIP HEMIARTHROPLASTY ON LEFT (Left) 4 Days Post-Op  Precautions:    In place during session: Wound vac L hip and EKG/telemetry   Chart, physical therapy assessment, plan of care and goals were reviewed.    ASSESSMENT  Patient continues with skilled PT services and is slowly progressing towards goals. Pt sitting in recliner upon PTA/COTA arrival, agreeable to session. (See below for objective details and assist levels). Overall pt tolerated session poorly today with transfers, limited by activity tolerance secondary to fatigue. Pt able to perform sit<>stand with less A however in standing pt demo's R lateral lean requiring increased A to maintain balance. Pt declined ambulation and progressing transfers this session. Pt able to recall therex and demo, educated on proper form and control. Pt educated on  current level of function and increased risk for functional declined, pt now agreeable to SNF for rehab, discussed with CM. Will continue to benefit from skilled PT services, and will continue to progress as tolerated.     Current Level of Function Impacting Discharge (mobility/balance): functional mobility, increased risk for fall and x2 A    Other factors to consider for discharge: functional mobility, PLOF         PLAN :  Patient continues to benefit from skilled intervention to address the above impairments.  Continue treatment per established plan of care to address goals.    Recommend with staff: Out of bed to chair for meals and Encourage HEP in prep for ADLs/mobility    Recommendation for discharge: (in order for the patient to meet his/her long term goals)  Skilled Nursing Facility    This discharge recommendation:  Has been made in collaboration with the attending provider and/or case management    IF patient discharges home will need the following DME: to be determined (TBD)       SUBJECTIVE:   Patient stated "I didn't realize I was as weak as I am."    OBJECTIVE DATA SUMMARY:   Critical Behavior:  Neurologic State: Alert  Orientation Level: Oriented X4  Cognition: Appropriate safety awareness, Appropriate decision making  Safety/Judgement: Lack of insight into deficits, Decreased awareness of need for safety  Functional Mobility Training:  Bed Mobility:  Transfers:  Sit to Stand: Minimum assistance;Assist x2  Stand to Sit: Minimum assistance;Assist x2  Balance:  Sitting: Intact;With support  Standing - Static: Poor;Constant support  Standing - Dynamic :  Poor;Constant support  Ambulation/Gait Training:      Therapeutic Exercises:       EXERCISE   Sets   Reps   Active Active Assist   Passive Self ROM   Comments   Ankle Pumps   [x]  []  []  []     Quad Sets/Glut Sets   [x]  []  []  []     Hamstring Sets   []  []  []  []     Short Arc Quads   []  []  []  []     Heel Slides   []  []  []  []     Straight Leg Raises   []  []  []  []      Hip abd/add   [x]  []  []  []     Long Arc Quads   []  []  []  []     Marching   []  []  []  []        []  []  []  []        Pain Rating:  No reports of pain     Activity Tolerance:   Fair, Poor, and requires frequent rest breaks    After treatment patient left in no apparent distress:   Bed locked and returned to lowest position, Sitting in chair and Call bell within reach    COMMUNICATION/COLLABORATION:   The patient's plan of care was discussed with: Occupational therapy assistant, Registered nurse, and Case management.     PT/OT sessions occurred together for increased safety of pt and clinician.     , PTA, PT   Time Calculation: 23 mins

## 2021-12-23 LAB — METABOLIC PANEL, BASIC
Anion gap: 4 mmol/L — ABNORMAL LOW (ref 5–15)
BUN/Creatinine ratio: 21 — ABNORMAL HIGH (ref 12–20)
BUN: 15 mg/dL (ref 6–20)
CO2: 25 mmol/L (ref 21–32)
Calcium: 7.8 mg/dL — ABNORMAL LOW (ref 8.5–10.1)
Chloride: 106 mmol/L (ref 97–108)
Creatinine: 0.73 mg/dL (ref 0.70–1.30)
Glucose: 103 mg/dL — ABNORMAL HIGH (ref 65–100)
Potassium: 3.5 mmol/L (ref 3.5–5.1)
Sodium: 135 mmol/L — ABNORMAL LOW (ref 136–145)
eGFR: >60 mL/min/{1.73_m2} (ref >60)

## 2021-12-23 LAB — HGB & HCT
HCT: 28.1 % — ABNORMAL LOW (ref 36.6–50.3)
HGB: 8.4 g/dL — ABNORMAL LOW (ref 12.1–17.0)

## 2021-12-23 LAB — GLUCOSE, POC
Glucose (POC): 103 mg/dL — ABNORMAL HIGH (ref 65–100)
Glucose (POC): 92 mg/dL (ref 65–100)
Glucose (POC): 137 mg/dL — ABNORMAL HIGH (ref 65–100)
Glucose (POC): 103 mg/dL — ABNORMAL HIGH (ref 65–100)

## 2021-12-23 LAB — POCT GLUCOSE
POC Glucose: 103 mg/dL — ABNORMAL HIGH (ref 65–100)
POC Glucose: 92 mg/dL (ref 65–100)
POC Glucose: 137 mg/dL — ABNORMAL HIGH (ref 65–100)
POC Glucose: 103 mg/dL — ABNORMAL HIGH (ref 65–100)

## 2021-12-23 LAB — BASIC METABOLIC PANEL
Anion Gap: 4 mmol/L — ABNORMAL LOW (ref 5–15)
BUN: 15 mg/dL (ref 6–20)
Bun/Cre Ratio: 21 — ABNORMAL HIGH (ref 12–20)
CO2: 25 mmol/L (ref 21–32)
Calcium: 7.8 mg/dL — ABNORMAL LOW (ref 8.5–10.1)
Chloride: 106 mmol/L (ref 97–108)
Creatinine: 0.73 mg/dL (ref 0.70–1.30)
ESTIMATED GLOMERULAR FILTRATION RATE: >60 mL/min/{1.73_m2} (ref >60)
Glucose: 103 mg/dL — ABNORMAL HIGH (ref 65–100)
Potassium: 3.5 mmol/L (ref 3.5–5.1)
Sodium: 135 mmol/L — ABNORMAL LOW (ref 136–145)

## 2021-12-23 LAB — HEMOGLOBIN AND HEMATOCRIT
Hematocrit: 28.1 % — ABNORMAL LOW (ref 36.6–50.3)
Hemoglobin: 8.4 g/dL — ABNORMAL LOW (ref 12.1–17.0)

## 2021-12-23 MED ORDER — GABAPENTIN 100 MG CAP
100 mg | ORAL_CAPSULE | Freq: Three times a day (TID) | ORAL | 2 refills | Status: AC
Start: 2021-12-23 — End: ?

## 2021-12-23 MED ORDER — GABAPENTIN 100 MG CAP
100 mg | Freq: Three times a day (TID) | ORAL | Status: AC
Start: 2021-12-23 — End: 2021-12-24
  Administered 2021-12-23 – 2021-12-24 (×3): via ORAL

## 2021-12-23 MED ORDER — OXYCODONE 5 MG TAB
5 mg | ORAL_TABLET | Freq: Four times a day (QID) | ORAL | 0 refills | Status: AC | PRN
Start: 2021-12-23 — End: 2021-12-26

## 2021-12-23 MED ORDER — FERROUS SULFATE 325 MG (65 MG ELEMENTAL IRON) TAB
325 mg (65 mg iron) | ORAL_TABLET | Freq: Every day | ORAL | 0 refills | Status: AC
Start: 2021-12-23 — End: 2022-01-23

## 2021-12-23 MED ORDER — CELECOXIB 200 MG CAP
200 mg | ORAL_CAPSULE | Freq: Two times a day (BID) | ORAL | 0 refills | Status: AC | PRN
Start: 2021-12-23 — End: 2021-12-28

## 2021-12-23 MED FILL — RANOLAZINE SR 500 MG 12 HR TAB: 500 mg | ORAL | Qty: 1

## 2021-12-23 MED FILL — AMIODARONE 200 MG TAB: 200 mg | ORAL | Qty: 1

## 2021-12-23 MED FILL — GABAPENTIN 100 MG CAP: 100 mg | ORAL | Qty: 1

## 2021-12-23 MED FILL — ELIQUIS 2.5 MG TABLET: 2.5 mg | ORAL | Qty: 1

## 2021-12-23 MED FILL — CELECOXIB 200 MG CAP: 200 mg | ORAL | Qty: 1

## 2021-12-23 MED FILL — STIMULANT LAXATIVE PLUS 8.6 MG-50 MG TABLET: ORAL | Qty: 1

## 2021-12-23 MED FILL — SYMBICORT 160 MCG-4.5 MCG/ACTUATION HFA AEROSOL INHALER: RESPIRATORY_TRACT | Qty: 6

## 2021-12-23 MED FILL — CARBIDOPA-LEVODOPA ER 25-100 MG TABLET, EXTENDED RELEASE: 25-100 mg | ORAL | Qty: 4

## 2021-12-23 MED FILL — SPIRIVA RESPIMAT 2.5 MCG/ACTUATION SOLUTION FOR INHALATION: 2.5 mcg/actuation | RESPIRATORY_TRACT | Qty: 8

## 2021-12-23 MED FILL — FERROUS SULFATE 325 MG (65 MG ELEMENTAL IRON) TAB: 325 mg (65 mg iron) | ORAL | Qty: 1

## 2021-12-23 MED FILL — CARVEDILOL 3.125 MG TAB: 3.125 mg | ORAL | Qty: 2

## 2021-12-23 MED FILL — ACETAMINOPHEN 500 MG TAB: 500 mg | ORAL | Qty: 2

## 2021-12-23 MED FILL — JARDIANCE 10 MG TABLET: 10 mg | ORAL | Qty: 1

## 2021-12-23 MED FILL — ROSUVASTATIN 5 MG TAB: 5 mg | ORAL | Qty: 4

## 2021-12-23 MED FILL — OXYCODONE 5 MG TAB: 5 mg | ORAL | Qty: 1

## 2021-12-23 MED FILL — PANTOPRAZOLE 40 MG TAB, DELAYED RELEASE: 40 mg | ORAL | Qty: 1

## 2021-12-23 NOTE — Progress Notes (Signed)
Progress Notes by Charisse Klinefelter, COTA at 12/23/21 1138                Author: Charisse Klinefelter, COTA  Service: Occupational Therapy  Author TypeCalton Golds       Filed: 12/23/21 1629  Date of Service: 12/23/21 1138  Status: Signed          Editor: Charisse Klinefelter, Dale (COTA)               OCCUPATIONAL THERAPY TREATMENT   Patient: Carlos Petersen (71 y.o. male)   Date: 12/23/2021   Diagnosis: Hip fracture ([place]) [S72.009A] <principal problem not specified>   Procedure(s) (LRB):   HIP HEMIARTHROPLASTY ON LEFT (Left) 5 Days Post-Op   Precautions:     In place during session: Wound vac L hip and EKG/telemetry    Chart, occupational therapy assessment, plan of care, and goals were reviewed.        ASSESSMENT   Pt continues with skilled OT services and is progressing towards goals. Upon COTA arrival, pt sitting at EOB and agreeable to tx session at this time.  Overall, pt continues to present with deficits  in generalized strength/AROM, coordination, bed mobility, static/dynamic sitting and standing balance and functional activity tolerance during performance of ADLs/mobility (see below for objective details and assist levels). Pt tolerated session fair  this date with completion of transfers, ADLs, and ambulation.  Pt completed transfers with Min/ModA x2 using RW for balance upon standing.  Pt noted with improvement in overall balance this date and control on BLE.  Pt completed seated rest break in recliner  and completed seated therex in recliner (see chart below for UE therex).  Pt required total A for bowel hygiene at beginning of session and donning of clean gown completed with Min/ModA.  Pt required total A for donning of clean socks.  Pt completed second  sit>stand and able to complete ambulation forward with Min/ModA x2.  Pt required cueing for rotating hips inward and for RW management.  Pt noted with improvement with standing tolerance and less fatigue visually noted compared to prior session.   Pt continues  to be motivated and left seated in recliner with needs met and PCT in room.  Recommend d/c to [facility] once medically appropriate.      Other factors to consider for discharge: family/social support, DME, time since onset, severity of deficits, decline from functional baseline               PLAN :   Patient continues to benefit from skilled intervention to address the above impairments.  Continue treatment per established plan of care to address goals.      Recommendation for discharge: (in order for the patient to meet his/her long term  goals)   [facility] [place]      This discharge recommendation:   Has been made in collaboration with the attending provider and/or case management      IF patient discharges home will need the following DME: TBD             SUBJECTIVE:     Patient stated I am glad you all are here.        OBJECTIVE DATA SUMMARY:     Cognitive/Behavioral Status:   Neurologic State: Alert   Orientation Level: Oriented X4   Cognition: Follows commands      Functional Mobility and Transfers for ADLs:      Transfers:   Sit to Stand: Minimum  assistance;Moderate assistance;Assist x2      Balance:   Sitting: Impaired;With support   Sitting - Static: Good (unsupported)   Sitting - Dynamic: Fair (occasional)   Standing: Impaired;With support   Standing - Static: Fair;Constant support   Standing - Dynamic : Fair;Constant support      ADL Intervention:   Upper Body [place]: Minimum  assistance      Lower Body Dressing Assistance   Socks: Total assistance (dependent)      Toileting   Bowel Hygiene: Total assistance (dependent)      Therapeutic Exercises:             Exercise  Sets  Reps  AROM  AAROM  PROM  Self PROM  Comments              Elbow flex/ext  1  20  '[x]'   '[]'   '[]'   '[]'           Pain:   0/10      Activity Tolerance:    Fair and requires rest breaks   Please refer to the flowsheet for vital signs taken during this treatment.      After  treatment patient left in no apparent distress:    Bed locked and in lowest position  Sitting in chair and Call bell within reach        COMMUNICATION/COLLABORATION:     The patients plan of care was discussed with: Physical therapy assistant and Certified nursing assistant/patient care technician.       [place] with PT for increased safety for pt and clinician.      Hailey Bernshausen, COTA   Time Calculation: 23 mins      Problem: Self Care Deficits Care Plan (Adult)   Goal: *Acute Goals and Plan of Care (Insert Text)   Description:   FUNCTIONAL STATUS PRIOR TO ADMISSION: Patient required minimal assistance for basic and instrumental ADLs.     HOME  SUPPORT: The patient lived with his son and required minimal assistance/contact guard assist for ADLs.    Occupational Therapy Goals  Initiated 12/19/2021  Patient Goal: Go to rehab and be able to walk to the bathroom.  1.  Patient will  perform lower body dressing with modified independence within 7 day(s).  2.  Patient will perform grooming with modified independence within 7 day(s).  3.  Patient will perform bathing with modified independence within 7 day(s).  4.  Patient  will perform toilet transfers with modified independence within 7 day(s).  5.  Patient will perform all aspects of toileting with modified independence within 7 day(s).  6.  Patient will participate in upper extremity therapeutic exercise/activities  with independence within 7 day(s).    7.  Patient will utilize energy conservation techniques during functional activities with verbal cues within 7 day(s).   Outcome: Progressing Towards Goal

## 2021-12-23 NOTE — Progress Notes (Signed)
Problem: Mobility Impaired (Adult and Pediatric)  Goal: *Acute Goals and Plan of Care (Insert Text)  Description: FUNCTIONAL STATUS PRIOR TO ADMISSION: Patient required minimal assistance for basic and instrumental ADLs.    HOME SUPPORT PRIOR TO ADMISSION: The patient lived with son and required minimal assistance/contact guard assist for ADLs.    Patient stated goal: get better  Patient will move from supine to sit and sit to supine , scoot up and down, and roll side to side in bed with minimal assistance/contact guard assist within 7 day(s).    Patient will transfer from bed to chair and chair to bed with minimal assistance/contact guard assist using the least restrictive device within 7 day(s).    Patient will improve static standing balance to minimal assistance within 1 week(s).  Patient will ambulate 30 feet with minimal assistance with least restrictive device within 1 weeks.   WBAT LLE  Outcome: Progressing Towards Goal   PHYSICAL THERAPY TREATMENT  Patient: Carlos [place] (71 y.o. male)  Date: 12/23/2021  Diagnosis: Hip fracture (HCC) [S72.009A] <principal problem not specified>  Procedure(s) (LRB):  HIP HEMIARTHROPLASTY ON LEFT (Left) 5 Days Post-Op  Precautions:    In place during session: Wound vac L hip and EKG/telemetry   Chart, physical therapy assessment, plan of care and goals were reviewed.    ASSESSMENT  Patient continues with skilled PT services and is progressing towards goals. Pt found sitting EOB upon PTA arrival, agreeable to session. (See below for objective details and assist levels). Overall pt tolerated session fair today with bed mobility, transfers, seated therex and ambulation. Pt demo's significant improvement in functional mobility, able to tolerate increase in ambulation distances, standing tolerance and transfers. Pt continues to demo narrow BOS, steppage gait and IR of B hips requiring manual A to increase BOS and RW management. Able to tolerate seated therex requiring  increased motivation for full ROM. Continues to benefit from skilled services to improve functional transfer and return to PLOF. Will continue to benefit from skilled PT services, and will continue to progress as tolerated.     Current Level of Function Impacting Discharge (mobility/balance): increased risk for fall and functional decline, A x2 for OOB mobility     Other factors to consider for discharge: PLOF          PLAN :  Patient continues to benefit from skilled intervention to address the above impairments.  Continue treatment per established plan of care to address goals.    Recommend with staff: Out of bed to chair for meals, Encourage HEP in prep for ADLs/mobility, and Frequent repositioning to prevent skin breakdown    Recommendation for discharge: (in order for the patient to meet his/her long term goals)  Skilled Nursing Facility    This discharge recommendation:  Has been made in collaboration with the attending provider and/or case management    IF patient discharges home will need the following DME: to be determined (TBD)       SUBJECTIVE:   Patient stated "I'm still tired ."    OBJECTIVE DATA SUMMARY:   Critical Behavior:  Neurologic State: Alert  Orientation Level: Oriented X4  Cognition: Follows commands  Safety/Judgement: Lack of insight into deficits, Decreased awareness of need for safety  Functional Mobility Training:  Bed Mobility:  Transfers:  Sit to Stand: Minimum assistance;Moderate assistance;Assist x2  Stand to Sit: Minimum assistance;Moderate assistance;Assist x2  Balance:  Sitting: Impaired;With support  Sitting - Static: Good (unsupported)  Sitting - Dynamic: Fair (occasional)  Standing: Impaired;With support  Standing - Static: Fair;Constant support  Standing - Dynamic : Fair;Constant support  Ambulation/Gait Training:  Distance (ft): 10 Feet (ft) (2+8)  Assistive Device: Gait belt;Walker, rolling  Ambulation - Level of Assistance: Minimal assistance;Moderate assistance;Assist  x2  Gait Abnormalities: Scissoring;Shuffling gait  Base of Support: Narrowed  Speed/Cadence: Slow;Shuffled      Therapeutic Exercises:       EXERCISE   Sets   Reps   Active Active Assist   Passive Self ROM   Comments   Ankle Pumps 1 30 [x]  []  []  []     Quad Sets/Glut Sets   []  []  []  []     Hamstring Sets   []  []  []  []     Short Arc Quads   []  []  []  []     Heel Slides   []  []  []  []     Straight Leg Raises   []  []  []  []     Hip abd/add   []  []  []  []     Long Arc Quads 1 15 [x]  []  []  []     Marching   []  []  []  []        []  []  []  []        Pain Rating:  No reports of pain     Activity Tolerance:   Fair and requires rest breaks    After treatment patient left in no apparent distress:   Bed locked and returned to lowest position, Sitting in chair, Call bell within reach, and Caregiver / family present    COMMUNICATION/COLLABORATION:   The patient's plan of care was discussed with: Occupational therapy .     PT/OT sessions occurred together for increased safety of pt and clinician.     , PTA, PT   Time Calculation: 23 mins

## 2021-12-23 NOTE — Progress Notes (Signed)
Nutrition Assessment     Type and Reason for Visit: Initial, RD nutrition re-screen/LOS (x6 days)    Nutrition Recommendations/Plan:   Continue diet.  Ensure HP x2/d.  Continue to monitor and document PO intakes in I/Os.     Nutrition Assessment:    dmitted for hip fx. POD 5 ORIF L. Hip. Screened for LOS. No h/o weight loss nor poor intake PTA noted. UBW=230# 3 months ago per EMR- severe weight loss(13.6%) indicated. Good appetite/intakes continue w/ >75% of meals. Continue diet for therapeutic management. Add ONS for increased energy needs. Labs: H/H 8.4/28.1. Meds: FeSul, PPI, jardiance, eliquis, tylenol, pericolace, crestor, sinemet, ranexa.    Malnutrition Assessment:  Malnutrition Status: Mild malnutrition     Estimated Daily Nutrient Needs:  Energy (kcal):  2250-2700 kcal/d (25-30 kcal/kg)  Protein (g):  72-135 gm/d (0.8 gm/kg, CKD III; 1.5 gm/kg ERAS,post-op)       Fluid (ml/day):  2250 mL/d    Nutrition Related Findings:  NFPE inappropriate at visit, appears nourished but suspects some wasting given severe weight loss. No N/V/D/C nor chewing/swallowing difficulties reported. No dysphagia hx noted. Last BM 4/23, bowel regimen in place. No edema.    Current Nutrition Therapies:  ADULT DIET Regular    Anthropometric Measures:  Height:  5\' 11"  (180.3 cm)  Current Body Wt:  90.1 kg (198 lb 10.2 oz) (4/24)  BMI: 27.7    Nutrition Diagnosis:   Unintended weight loss related to inadequate protein-energy intake as evidenced by weight loss 7.5% in 3 months    Nutrition Interventions:   Food and/or Nutrient Delivery: Continue current diet  Nutrition Education/Counseling: No recommendations at this time  Coordination of Nutrition Care: Continue to monitor while inpatient  Plan of Care discussed with: RN    Goals:  Goals: Meet at least 75% of estimated needs, PO intake 75% or greater, within 7 days    Nutrition Monitoring and Evaluation:   Behavioral-Environmental Outcomes: None identified  Food/Nutrient Intake Outcomes:  Food and nutrient intake  Physical Signs/Symptoms Outcomes: Biochemical data, Meal time behavior, Weight    Discharge Planning:    Continue current diet    06-08-1975, RD  Contact: ext. [phone].

## 2021-12-23 NOTE — Progress Notes (Signed)
Called Dr. Williemae Natter, MD regarding left hip dressing. Per Dr. Salli Quarry nurse to replace Prevena dressing. Dressing to remain in place until seen again by orthopedics.

## 2021-12-23 NOTE — Progress Notes (Signed)
Problem: Pain  Goal: *Control of Pain  Outcome: Progressing Towards Goal     Problem: Patient Education: Go to Patient Education Activity  Goal: Patient/Family Education  Outcome: Progressing Towards Goal

## 2021-12-23 NOTE — Progress Notes (Signed)
DC Plan: [facility]       Room# 102V       Report: [phone] [facility] unit)       Transportation: [facility] 6pm    [facility] received Josem Kaufmann. Cm received room# 742V.     Cm messaged attending via [employer].     Cm called [facility] and set up transportation [phone]. Cm received an ETA for 6pm.     Cm met with pt at the bedside and notified him of discharge today to [facility] via ambulance. Cm informed pt Probation officer will call his son to notify him.     Cm spoke with pt's son Kordae Buonocore [phone] and informed him of discharge today. Cm informed son ETA for transportation. Address, phone number and room number was provided to son.    Discharge plan of care/case management plan validated with provider's discharge order.

## 2021-12-23 NOTE — Discharge Summary (Signed)
Discharge Summary by Elberta Spaniel, [place] at 12/23/21 1348                Author: Elberta Spaniel, PA  Service: Physician Assistant  Author Type: Physician Assistant       Filed: 12/23/21 1404  Date of Service: 12/23/21 1348  Status: Addendum          Editor: Elberta Spaniel, [place] (Physician Assistant)       Related Notes: Original Note by Elberta Spaniel, [place] (Physician Assistant) filed at 12/23/21  1404          Cosigner: Otho Najjar, MD at 12/23/21 [place]                                                      Hospitalist Discharge Summary        Patient ID:     Carlos [place]   [id number]   70 y.o.   February 22, 1951      Admit date: 12/17/2021      Discharge date : 12/23/2021         Final Diagnoses:    Active Problems:     Hip fracture ([place]) (12/17/2021)            Reason for Hospitalization/Hospital Course:    Carlos Peters[place] is a 71 y.o. male past medical history of COPD, diabetes, heart disease, hypertension, hyperlipidemia, Parkinson's disease,  pulmonary embolism on anticoagulation that presented to the ED on 12/17/2021 after sustaining ground-level fall 2 days prior.  In the ED x-ray  of the femur and pelvis showed an acute mildly displaced subcapital fracture of the left femoral neck.  Laboratory data showed a hemoglobin 8.1.  Vital signs stable.   Requested admission for ORIF of the left hip. Ortho consulted. left ORIF performed on 12/18/2021. Cardiology consulted for clearance. ECHO shows EF 60-65%.  Patient's hemoglobin is 4.3.  Unsure  of the accuracy of the results however he was  was transfused 1 unit packed red blood cells.  There is only 200 cc blood loss per operative note.  Iron studies revealed low iron; low TIBC, and low  Iron% saturation. He was started on iron supplements.      Patient medically stable for discharge. Follow-up 2 weeks at the orthopedic clinic. Follow up with primary care in 10-14 days for careful titration of medications and management of comorbid condition. CBC needs to  be performed while on eliquis to monitor  Hgb.       Discharge Medications:      Current Discharge Medication List                 START taking these medications          Details        ferrous sulfate 325 mg (65 mg iron) tablet  Take 1 Tablet by mouth daily (with breakfast) for 30 days.   Qty: 30 Tablet, Refills: 0   Start date: 12/24/2021, End date: 01/23/2022               celecoxib (CELEBREX) 200 mg capsule  Take 1 Capsule by mouth two (2) times daily as needed for Pain for up to 5 days.   Qty: 10 Capsule, Refills: 0   Start date: 12/23/2021, End date: 12/28/2021  oxyCODONE IR (ROXICODONE) 5 mg immediate release tablet  Take 1 Tablet by mouth every six (6) hours as needed for Pain for up to 3 days. Max Daily Amount: 20 mg.   Qty: 12 Tablet, Refills: 0   Start date: 12/23/2021, End date: 12/26/2021          Associated Diagnoses: Closed fracture of neck of left femur, initial encounter ([place])                        CONTINUE these medications which have CHANGED          Details        gabapentin (NEURONTIN) 100 mg capsule  Take 1 Capsule by mouth three (3) times daily. Max Daily Amount: 300 mg.   Qty: 60 Capsule, Refills: 2   Start date: 12/23/2021          Associated Diagnoses: Type 2 diabetes mellitus with diabetic neuropathy, without long-term current use  of insulin (HCC)                        CONTINUE these medications which have NOT CHANGED          Details        omeprazole (PRILOSEC) 40 mg capsule  Take 1 Capsule by mouth daily.               albuterol (PROVENTIL HFA, VENTOLIN HFA, PROAIR HFA) 90 mcg/actuation inhaler  Take 2 Puffs by inhalation every six (6) hours as needed for Wheezing.               alendronate (FOSAMAX) 70 mg tablet  Take 1 Tablet by mouth every Wednesday.               rosuvastatin (CRESTOR) 20 mg tablet  Take 1 Tablet by mouth nightly for 360 days.   Qty: 90 Tablet, Refills: 3          Associated Diagnoses: PAD (peripheral artery disease) (HCC)               ranolazine ER  (RANEXA) 500 mg SR tablet  Take 1 Tablet by mouth two (2) times a day.   Qty: 180 Tablet, Refills: 3          Associated Diagnoses: Coronary artery disease involving native coronary artery of native heart without  angina pectoris               apixaban (Eliquis) 5 mg tablet  Take 1 Tablet by mouth two (2) times a day.   Qty: 180 Tablet, Refills: 3          Associated Diagnoses: Longstanding persistent atrial fibrillation (HCC)               carbidopa-levodopa ER (SINEMET CR) 50-200 mg per tablet  Take 2 Tablets by mouth three (3) times daily for 360 days.   Qty: 540 Tablet, Refills: 3          Associated Diagnoses: Parkinson disease ([place])               carvediloL (COREG) 6.25 mg tablet  Take 1 Tablet by mouth two (2) times a day.   Qty: 180 Tablet, Refills: 3          Associated Diagnoses: Chronic congestive heart failure, unspecified heart failure type ([place]); Longstanding  persistent atrial fibrillation (HCC)               amiodarone (  CORDARONE) 200 mg tablet  Take 1 Tablet by mouth daily.   Qty: 90 Tablet, Refills: 3          Associated Diagnoses: Longstanding persistent atrial fibrillation (HCC)               fluticasone-umeclidinium-vilanterol (Trelegy Ellipta) 100-62.5-25 mcg inhaler  INHALE 1 PUFF BY MOUTH DAILY.   Qty: 1 Each, Refills: 5          Associated Diagnoses: Panlobular emphysema ([place])                        STOP taking these medications                  empagliflozin (Jardiance) 10 mg tablet  Comments:    Reason for Stopping:                      vortioxetine (Trintellix) 20 mg tablet  Comments:    Reason for Stopping:                                Follow up Care:     1. [place] Cheri Rous, Luanna Salk, MD in 1-2 weeks.          Follow-up Information                  Follow up With  Specialties  Details  Why  Contact Info              [place] Debria Garret, MD  Internal Medicine Physician  Follow up in 2 week(s)  As needed, If symptoms worsen  [address]   [address]   [place]  [address]   [phone]          Thea Alken, MD  Internal Medicine Physician      [address]   [address]   [place] [address]   [phone]                 Williemae Natter, MD  Orthopedic Surgery  Follow up in 2 week(s)    [place]   [address]   [place] [address]   [phone]                           Patient Follow Up Instructions:    Activity: Activity as tolerated   Diet:  Cardiac Diet   Wound Care:  Keep the wound covered at all times.  Do not wet the wound.       Condition at Discharge:  Stable   __________________________________________________________________      Disposition   [place]   ____________________________________________________________________      Code Status:   DNR   ___________________________________________________________________      Discharge Exam:   Patient seen and examined by me on discharge day.   Pertinent Findings:   Gen:    Not in distress   Chest: Clear lungs   CVS:   Regular rhythm.  No edema   Abd:  Soft, not distended, not tender   Neuro:  Alert            CONSULTATIONS: Cardiology and Orthopedic Surgery      Significant Diagnostic Studies:      Recent Results (from the past 24 hour(s))  GLUCOSE, POC          Collection Time: 12/22/21  4:33 PM         Result  Value  Ref Range            Glucose (POC)  111 (H)  65 - 100 mg/dL       Performed by  Sharon Mt         GLUCOSE, POC          Collection Time: 12/22/21  9:30 PM         Result  Value  Ref Range            Glucose (POC)  103 (H)  65 - 100 mg/dL       Performed by  [place], POC          Collection Time: 12/23/21  8:04 AM         Result  Value  Ref Range            Glucose (POC)  92  65 - 100 mg/dL       Performed by  Durene Cal         HGB & HCT          Collection Time: 12/23/21  9:53 AM         Result  Value  Ref Range            HGB  8.4 (L)  12.1 - 17.0 g/dL       HCT  28.1 (L)  36.6 - 32.5 %       METABOLIC PANEL, BASIC           Collection Time: 12/23/21  9:53 AM         Result  Value  Ref Range            Sodium  135 (L)  136 - 145 mmol/L            Potassium  3.5  3.5 - 5.1 mmol/L            Chloride  106  97 - 108 mmol/L       CO2  25  21 - 32 mmol/L       Anion gap  4 (L)  5 - 15 mmol/L       Glucose  103 (H)  65 - 100 mg/dL       BUN  15  6 - 20 mg/dL       Creatinine  0.73  0.70 - 1.30 mg/dL       BUN/Creatinine ratio  21 (H)  12 - 20         eGFR  >60  >60 ml/min/1.64m       Calcium  7.8 (L)  8.5 - 10.1 mg/dL       GLUCOSE, POC          Collection Time: 12/23/21 12:22 PM         Result  Value  Ref Range            Glucose (POC)  137 (H)  65 - 100 mg/dL            Performed by  BSharon Mt           DUPLEX LOWER EXT VENOUS BILAT       Final Result  XR HIP LT W OR WO PELV 2-3 VWS       Final Result     Left hip hemiarthroplasty with expected immediate postoperative appearance.            XR CHEST PORT       Final Result     Stable minimal atelectasis or scar. Stable right midlung field parenchymal     opacity. No new acute disease.                 XR FEMUR LT 2 V       Final Result          1. Acute, mildly displaced subcapital fracture of the LEFT femoral neck.          2. No additional acute fracture of the LEFT femur is identified.            XR PELV AP ONLY       Final Result          1. Acute, mildly displaced subcapital fracture of the LEFT femoral neck.          2. No additional acute fracture of the LEFT femur is identified.                  Time spent in direct and indirect care including coordination of services: 30 minutes      Signed:   Elberta Spaniel, [place]   12/23/2021   1:48 PM      This is dictation was done by dragon, computer voice recognition software.  Quite often unanticipated grammatical, syntax, homophones and other interpretive errors or inadvertently transcribed by the  computer software.  Please excuse errors that have escaped final proofreading.  Thank you.

## 2021-12-23 NOTE — Progress Notes (Signed)
Progress Notes by Theresia BoughSidell, Arthi Mcdonald F, NP at 12/23/21 1534                Author: Theresia BoughSidell, Sicilia Killough F, NP  Service: Nurse Practitioner  Author Type: Nurse Practitioner       Filed: 12/23/21 1536  Date of Service: 12/23/21 1534  Status: Attested           Editor: Theresia BoughSidell, Kyi Romanello F, NP (Nurse Practitioner)  Cosigner: Robley FriesNiazi, Kareem, MD at 12/23/21 1548          Attestation signed by Robley FriesNiazi, Kareem, MD at 12/23/21 1548          I have seen and examined patient and agree with the assesment and plan as documented by Yvonna Alanisori Janara Klett, NP. Examination findings notable for stable vital signs  and exam.  Plan is to continue with current cardiac medications and work up, as well as other items noted in above.                                     CARDIOLOGY PROGESS NOTE         Carlos Petersen is a 71 y.o. year-old male with past medical history significant for PE, COPD, HLD, Parkinson's, and DM who was evaluated on 4/20 for preop cardiac evaluation prior to Left hip surgery due to femoral neck fracture from fall.      Patient seen and examined. He underwent left hemiarthroplasty Friday 4/21, uncomplicated. Resting in bed, working on placement.  Breathing stable. No report of chest pain or palpitations.  Seems to be in good spirits.      Records from hospital admission course thus far reviewed.       Telemetry reviewed.       INPATIENT MEDICATIONS:  Home medications reviewed.     Current Facility-Administered Medications:    ?  gabapentin (NEURONTIN) capsule 100 mg, 100 mg, Oral, TID, Conley, Christian, PA   ?  apixaban (ELIQUIS) tablet 2.5 mg, 2.5 mg, Oral, Q12H, Young, Chevin L, PA-C, 2.5 mg at 12/23/21 0848   ?  [START ON 12/27/2021] apixaban (ELIQUIS) tablet 5 mg, 5 mg, Oral, Q12H, Young, Chevin L, PA-C   ?  ferrous sulfate tablet 325 mg, 1 Tablet, Oral, DAILY WITH BREAKFAST, Gonzalesonley, Platinumhristian, PA, 325 mg at 12/23/21 0847   ?  0.9% sodium chloride infusion 250 mL, 250 mL, IntraVENous, PRN, Young, Chevin L, PA-C   ?   oxyCODONE IR (ROXICODONE) tablet 5 mg, 5 mg, Oral, Q4H PRN, Young, Chevin L, PA-C, 5 mg at 12/22/21 2125   ?  0.9% sodium chloride infusion 250 mL, 250 mL, IntraVENous, PRN, Hope, Karrie Doffingobert L Jr., MD   ?  sodium chloride (NS) flush 5-40 mL, 5-40 mL, IntraVENous, Q8H, Benaissa, Rafik, MD, 10 mL at 12/23/21 0538   ?  acetaminophen (TYLENOL) tablet 1,000 mg, 1,000 mg, Oral, Q6H, Benaissa, Rafik, MD, 1,000 mg at 12/23/21 1205   ?  oxyCODONE IR (ROXICODONE) tablet 5 mg, 5 mg, Oral, Q4H PRN, Smith MinceBenaissa, Rafik, MD, 5 mg at 12/19/21 1958   ?  oxyCODONE IR (ROXICODONE) tablet 10 mg, 10 mg, Oral, Q4H PRN, Polo RileyBenaissa, Rafik, MD, 10 mg at 12/20/21 0840   ?  naloxone (NARCAN) injection 0.4 mg, 0.4 mg, IntraVENous, PRN, Polo RileyBenaissa, Rafik, MD   ?  senna-docusate (PERICOLACE) 8.6-50 mg per tablet 1 Tablet, 1 Tablet, Oral, BID, Smith MinceBenaissa, Rafik, MD, 1 Tablet at 12/23/21 0847   ?  0.9% sodium chloride infusion 250 mL, 250 mL, IntraVENous, PRN, Tegegn, Nahom, MD   ?  albuterol (PROVENTIL HFA, VENTOLIN HFA, PROAIR HFA) inhaler 2 Puff, 2 Puff, Inhalation, Q6H PRN, Bishop, Krysten, PA-C   ?  amiodarone (CORDARONE) tablet 200 mg, 200 mg, Oral, DAILY, Bishop, Krysten, PA-C, 200 mg at 12/23/21 0848   ?  carbidopa-levodopa ER (SINEMET CR) 25-100 mg per tablet 4 Tablet, 4 Tablet, Oral, TID, Bishop, Krysten, PA-C, 4 Tablet at 12/23/21 0846   ?  carvediloL (COREG) tablet 6.25 mg, 6.25 mg, Oral, BID, Bishop, Krysten, PA-C, 6.25 mg at 12/23/21 0847   ?  empagliflozin (JARDIANCE) tablet 10 mg, 10 mg, Oral, DAILY, Bishop, Krysten, PA-C, 10 mg at 12/23/21 0847   ?  budesonide-formoteroL (SYMBICORT) 160-4.5 mcg/actuation HFA inhaler 1 Puff, 1 Puff, Inhalation, BID RT, 1 Puff at 12/23/21 0859 **AND**  tiotropium bromide (SPIRIVA RESPIMAT) 2.5 mcg /actuation, 2 Puff, Inhalation, DAILY, Bishop, [place], PA-C, 2 Puff at 12/23/21 6599   ?  pantoprazole (PROTONIX) tablet 40 mg, 40 mg, Oral, DAILY, Bishop, Krysten, PA-C, 40 mg at 12/23/21 0847   ?  ranolazine ER  (RANEXA) tablet 500 mg, 500 mg, Oral, BID, Bishop, Krysten, PA-C, 500 mg at 12/23/21 0848   ?  rosuvastatin (CRESTOR) tablet 20 mg, 20 mg, Oral, QHS, Bishop, Krysten, PA-C, 20 mg at 12/22/21 2127   ?  sodium chloride (NS) flush 5-40 mL, 5-40 mL, IntraVENous, PRN, Bishop, Krysten, PA-C   ?  acetaminophen (TYLENOL) tablet 650 mg, 650 mg, Oral, Q6H PRN **OR** [DISCONTINUED] acetaminophen (TYLENOL) suppository 650 mg, 650 mg,  Rectal, Q6H PRN, Bishop, Krysten, PA-C   ?  polyethylene glycol (MIRALAX) packet 17 g, 17 g, Oral, DAILY PRN, Bishop, Krysten, PA-C   ?  ondansetron (ZOFRAN ODT) tablet 4 mg, 4 mg, Oral, Q8H PRN **OR** ondansetron (ZOFRAN) injection 4 mg, 4 mg, IntraVENous, Q6H PRN, Bishop,  Krysten, PA-C, 4 mg at 12/19/21 2206   ?  glucose chewable tablet 16 g, 4 Tablet, Oral, PRN, Bishop, Krysten, PA-C   ?  glucagon (GLUCAGEN) injection 1 mg, 1 mg, IntraMUSCular, PRN, Bishop, Krysten, PA-C   ?  dextrose 10% infusion 0-250 mL, 0-250 mL, IntraVENous, PRN, Bishop, Krysten, PA-C   ?  insulin lispro (HUMALOG) injection, , SubCUTAneous, AC&HS, Bishop, Krysten, PA-C   ?  celecoxib (CELEBREX) capsule 200 mg, 200 mg, Oral, BID, Young, Chevin L, PA-C, 200 mg at 12/23/21 0847       ALLERGIES:  Allergies reviewed with the patient,No Known Allergies .      REVIEW OF SYSTEMS:  Complete review of systems performed, pertinents noted above, all other systems are negative.      Physical examination :   No apparent distress.   Heart is regular, rate and rhythm. Normal S1, S2, no murmurs are appreciated.   Lungs are clear bilaterally.   Abdomen is soft, nontender, normal bowel sounds.   Extremities have no edema.      Visit Vitals      BP  (!) 113/47        Pulse  72     Temp  97.8 F (36.6 C)     Resp  17     Ht  5\' 11"  (1.803 m)     Wt  90.1 kg (198 lb 10.2 oz)     SpO2  99%        BMI  27.70 kg/m           Recent labs results and  imaging reviewed.       Impression and plan as discussed with Dr. Dillon Bjork   1.  S/p left hip  hemiarthroplasty, uncomplicated.    2.  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 Recent echo with EF 55%  Continue amiodarone. Occasional PVCs on tele.  Keep electrolytes within normal limits.   3.  History of PE,  Eliquis resumed per Ortho, closely monitor H/H, resumed 5 mg dosing   4.  COPD: Primary following.       Please do not hesitate to call me if additional questions arise.      Theresia Bough, NP   12/23/2021

## 2021-12-23 NOTE — Progress Notes (Signed)
TRANSFER - OUT REPORT:    Verbal report given to Morrie Sheldon (name) on Carlos Petersen  being transferred to [facility] (unit) for D/C       Report consisted of patient's Situation, Background, Assessment and   Recommendations(SBAR).     Information from the following report(s) SBAR was reviewed with the receiving nurse.    Lines: D/C      Opportunity for questions and clarification was provided.      Patient transported with: [facility]

## 2021-12-23 NOTE — Progress Notes (Signed)
Progress  Notes by Hassell Halim, RN at 12/23/21 1528                Author: Hassell Halim, RN  Service: NURSING  Author Type: Registered Nurse       Filed: 12/23/21 1529  Date of Service: 12/23/21 1528  Status: Signed          Editor: Hassell Halim, RN (Registered Nurse)                  Problem: Pain   Goal: *Control of Pain   12/23/2021 1528 by Hassell Halim, RN   Outcome: Resolved/Met   12/23/2021 1112 by Hassell Halim, RN   Outcome: Progressing Towards Goal       Problem: Patient Education: Go to Patient Education Activity   Goal: Patient/Family Education   12/23/2021 1528 by Hassell Halim, RN   Outcome: Resolved/Met   12/23/2021 1112 by Hassell Halim, RN   Outcome: Progressing Towards Goal       Problem: Discharge Planning   Goal: *Discharge to safe environment   Outcome: Resolved/Met   Goal: *Knowledge of medication management   Outcome: Resolved/Met   Goal: *Knowledge of discharge instructions   Outcome: Resolved/Met       Problem: Patient Education: Go to Patient Education Activity   Goal: Patient/Family Education   Outcome: Resolved/Met       Problem: Patient Education: Go to Patient Education Activity   Goal: Patient/Family Education   Outcome: Resolved/Met       Problem: Falls - Risk of   Goal: *Absence of Falls   Description: Document Schmid Fall Risk and appropriate interventions in the flowsheet.   Outcome: Resolved/Met   Note: Fall Risk Interventions:                 Medication Interventions: Patient to call before getting OOB      Elimination Interventions: Call light in reach, Patient to call for help with toileting needs                    Problem: Patient Education: Go to Patient Education Activity   Goal: Patient/Family Education   Outcome: Resolved/Met       Problem: Pressure Injury - Risk of   Goal: *Prevention of pressure injury   Description: Document Braden Scale and appropriate interventions in the flowsheet.   Outcome: Resolved/Met   Note: Pressure Injury Interventions:   Sensory  Interventions: Assess changes in LOC, Check visual cues for pain      Moisture Interventions: Absorbent underpads      Activity Interventions: Increase time out of bed      Mobility Interventions: HOB 30 degrees or less      Nutrition Interventions: Document food/fluid/supplement intake                              Problem: Patient Education: Go to Patient Education Activity   Goal: Patient/Family Education   Outcome: Resolved/Met       Problem: Patient Education: Go to Patient Education Activity   Goal: Patient/Family Education   Outcome: Resolved/Met       Problem: Patient Education: Go to Patient Education Activity   Goal: Patient/Family Education   Outcome: Resolved/Met

## 2021-12-24 LAB — GLUCOSE, POC
Glucose (POC): 118 mg/dL — ABNORMAL HIGH (ref 65–100)
Glucose (POC): 128 mg/dL — ABNORMAL HIGH (ref 65–100)
Glucose (POC): 132 mg/dL — ABNORMAL HIGH (ref 65–100)

## 2021-12-24 LAB — POCT GLUCOSE
POC Glucose: 118 mg/dL — ABNORMAL HIGH (ref 65–100)
POC Glucose: 128 mg/dL — ABNORMAL HIGH (ref 65–100)
POC Glucose: 132 mg/dL — ABNORMAL HIGH (ref 65–100)

## 2021-12-24 MED FILL — PANTOPRAZOLE 40 MG TAB, DELAYED RELEASE: 40 mg | ORAL | Qty: 1

## 2021-12-24 MED FILL — FERROUS SULFATE 325 MG (65 MG ELEMENTAL IRON) TAB: 325 mg (65 mg iron) | ORAL | Qty: 1

## 2021-12-24 MED FILL — CELECOXIB 200 MG CAP: 200 mg | ORAL | Qty: 1

## 2021-12-24 MED FILL — SYMBICORT 160 MCG-4.5 MCG/ACTUATION HFA AEROSOL INHALER: RESPIRATORY_TRACT | Qty: 6

## 2021-12-24 MED FILL — CARBIDOPA-LEVODOPA ER 25-100 MG TABLET, EXTENDED RELEASE: 25-100 mg | ORAL | Qty: 4

## 2021-12-24 MED FILL — JARDIANCE 10 MG TABLET: 10 mg | ORAL | Qty: 1

## 2021-12-24 MED FILL — RANOLAZINE SR 500 MG 12 HR TAB: 500 mg | ORAL | Qty: 1

## 2021-12-24 MED FILL — CARVEDILOL 3.125 MG TAB: 3.125 mg | ORAL | Qty: 2

## 2021-12-24 MED FILL — AMIODARONE 200 MG TAB: 200 mg | ORAL | Qty: 1

## 2021-12-24 MED FILL — ROSUVASTATIN 5 MG TAB: 5 mg | ORAL | Qty: 4

## 2021-12-24 MED FILL — SPIRIVA RESPIMAT 2.5 MCG/ACTUATION SOLUTION FOR INHALATION: 2.5 mcg/actuation | RESPIRATORY_TRACT | Qty: 4

## 2021-12-24 MED FILL — STIMULANT LAXATIVE PLUS 8.6 MG-50 MG TABLET: ORAL | Qty: 1

## 2021-12-24 MED FILL — GABAPENTIN 100 MG CAP: 100 mg | ORAL | Qty: 1

## 2021-12-24 MED FILL — OXYCODONE 5 MG TAB: 5 mg | ORAL | Qty: 1

## 2021-12-24 MED FILL — ELIQUIS 2.5 MG TABLET: 2.5 mg | ORAL | Qty: 1

## 2021-12-24 NOTE — Progress Notes (Signed)
Progress Notes by Theresia Bough, NP at 12/24/21 1049                Author: Theresia Bough, NP  Service: Nurse Practitioner  Author Type: Nurse Practitioner       Filed: 12/24/21 1052  Date of Service: 12/24/21 1049  Status: Attested           Editor: Theresia Bough, NP (Nurse Practitioner)  Cosigner: Jerl Mina, MD at 12/24/21 1725          Attestation signed by Jerl Mina, MD at 12/24/21 1725           Agree with findings and plan of care per NP Monie Shere.                                   CARDIOLOGY PROGESS NOTE         Carlos Petersen is a 71 y.o. year-old male with past medical history significant for PE, COPD, HLD, Parkinson's, and DM who was evaluated on 4/20 for preop cardiac evaluation prior to Left hip surgery due to femoral neck fracture from fall.      Patient seen and examined. He underwent left hemiarthroplasty Friday 4/21, uncomplicated. Resting in bed, issues with placement.  Breathing stable. No report of chest pain or palpitations.  Pain well controlled.      Records from hospital admission course thus far reviewed.       Telemetry reviewed.       INPATIENT MEDICATIONS:  Home medications reviewed.     Current Facility-Administered Medications:    ?  gabapentin (NEURONTIN) capsule 100 mg, 100 mg, Oral, TID, Conley, Christian, PA, 100 mg at 12/24/21 1308   ?  apixaban (ELIQUIS) tablet 2.5 mg, 2.5 mg, Oral, Q12H, Young, Chevin L, PA-C, 2.5 mg at 12/24/21 6578   ?  [START ON 12/27/2021] apixaban (ELIQUIS) tablet 5 mg, 5 mg, Oral, Q12H, Young, Chevin L, PA-C   ?  ferrous sulfate tablet 325 mg, 1 Tablet, Oral, DAILY WITH BREAKFAST, Laurie, [place], [place], 325 mg at 12/24/21 4696   ?  0.9% sodium chloride infusion 250 mL, 250 mL, IntraVENous, PRN, Young, Chevin L, PA-C   ?  oxyCODONE IR (ROXICODONE) tablet 5 mg, 5 mg, Oral, Q4H PRN, Young, Chevin L, PA-C, 5 mg at 12/24/21 2952   ?  0.9% sodium chloride infusion 250 mL, 250 mL, IntraVENous, PRN, Hope, Karrie Doffing., MD   ?   acetaminophen (TYLENOL) tablet 1,000 mg, 1,000 mg, Oral, Q6H, Benaissa, Rafik, MD, 1,000 mg at 12/23/21 1205   ?  oxyCODONE IR (ROXICODONE) tablet 5 mg, 5 mg, Oral, Q4H PRN, Smith Mince, MD, 5 mg at 12/19/21 1958   ?  oxyCODONE IR (ROXICODONE) tablet 10 mg, 10 mg, Oral, Q4H PRN, Polo Riley, Rafik, MD, 10 mg at 12/20/21 0840   ?  naloxone (NARCAN) injection 0.4 mg, 0.4 mg, IntraVENous, PRN, Polo Riley, Rafik, MD   ?  senna-docusate (PERICOLACE) 8.6-50 mg per tablet 1 Tablet, 1 Tablet, Oral, BID, Benaissa, Rafik, MD, 1 Tablet at 12/24/21 0829   ?  0.9% sodium chloride infusion 250 mL, 250 mL, IntraVENous, PRN, Tegegn, Nahom, MD   ?  albuterol (PROVENTIL HFA, VENTOLIN HFA, PROAIR HFA) inhaler 2 Puff, 2 Puff, Inhalation, Q6H PRN, Bishop, Krysten, PA-C   ?  amiodarone (CORDARONE) tablet 200 mg, 200 mg, Oral, DAILY, Bishop, Krysten, PA-C, 200 mg at 12/24/21 8413   ?  carbidopa-levodopa ER (SINEMET CR) 25-100 mg per tablet 4 Tablet, 4 Tablet, Oral, TID, Bishop, Krysten, PA-C, 4 Tablet at 12/24/21 [id number]   ?  carvediloL (COREG) tablet 6.25 mg, 6.25 mg, Oral, BID, Bishop, Krysten, PA-C, 6.25 mg at 12/24/21 [id number]   ?  empagliflozin (JARDIANCE) tablet 10 mg, 10 mg, Oral, DAILY, Bishop, Krysten, PA-C, 10 mg at 12/24/21 0829   ?  budesonide-formoteroL (SYMBICORT) 160-4.5 mcg/actuation HFA inhaler 1 Puff, 1 Puff, Inhalation, BID RT, 1 Puff at 12/24/21 0835 **AND**  tiotropium bromide (SPIRIVA RESPIMAT) 2.5 mcg /actuation, 2 Puff, Inhalation, DAILY, Bishop, [place], PA-C, 2 Puff at 12/24/21 [id number]   ?  pantoprazole (PROTONIX) tablet 40 mg, 40 mg, Oral, DAILY, Bishop, Krysten, PA-C, 40 mg at 12/24/21 [id number]   ?  ranolazine ER (RANEXA) tablet 500 mg, 500 mg, Oral, BID, Bishop, Krysten, PA-C, 500 mg at 12/24/21 [id number]   ?  rosuvastatin (CRESTOR) tablet 20 mg, 20 mg, Oral, QHS, Bishop, Krysten, PA-C, 20 mg at 12/23/21 2253   ?  acetaminophen (TYLENOL) tablet 650 mg, 650 mg, Oral, Q6H PRN **OR** [DISCONTINUED] acetaminophen (TYLENOL)  suppository 650 mg, 650 mg,  Rectal, Q6H PRN, Bishop, Krysten, PA-C   ?  polyethylene glycol (MIRALAX) packet 17 g, 17 g, Oral, DAILY PRN, Bishop, Krysten, PA-C   ?  ondansetron (ZOFRAN ODT) tablet 4 mg, 4 mg, Oral, Q8H PRN **OR** ondansetron (ZOFRAN) injection 4 mg, 4 mg, IntraVENous, Q6H PRN, Bishop,  Krysten, PA-C, 4 mg at 12/19/21 2206   ?  glucose chewable tablet 16 g, 4 Tablet, Oral, PRN, Bishop, Krysten, PA-C   ?  glucagon (GLUCAGEN) injection 1 mg, 1 mg, IntraMUSCular, PRN, Bishop, Krysten, PA-C   ?  dextrose 10% infusion 0-250 mL, 0-250 mL, IntraVENous, PRN, Bishop, Krysten, PA-C   ?  insulin lispro (HUMALOG) injection, , SubCUTAneous, AC&HS, Bishop, Krysten, PA-C   ?  celecoxib (CELEBREX) capsule 200 mg, 200 mg, Oral, BID, Young, Chevin L, PA-C, 200 mg at 12/24/21 [id number]       ALLERGIES:  Allergies reviewed with the patient,No Known Allergies .      REVIEW OF SYSTEMS:  Complete review of systems performed, pertinents noted above, all other systems are negative.      Physical examination :   No apparent distress.   Heart is regular, rate and rhythm. Normal S1, S2, no murmurs are appreciated.   Lungs are clear bilaterally.   Abdomen is soft, nontender, normal bowel sounds.   Extremities have no edema.      Visit Vitals      BP  (!) 134/57 (BP 1 Location: Right upper arm, BP Patient Position: At rest)        Pulse  73     Temp  98.5 F (36.9 C)     Resp  16     Ht  5\' 11"  (1.803 m)     Wt  90.1 kg (198 lb 10.2 oz)     SpO2  99%        BMI  27.70 kg/m           Recent labs results and imaging reviewed.       Impression and plan as discussed with Dr. Dillon BjorkNiazi   1.  S/p left hip hemiarthroplasty, uncomplicated.    2.  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 Recent echo with EF 55%  Continue amiodarone. Occasional PVCs on tele.  Keep electrolytes within normal limits.   3.  History of PE,  Eliquis resumed per Ortho, closely monitor  H/H, resumed 5 mg dosing for vte   4.  COPD: Primary following.       Will sign off at this time  while awaiting placement.  Follow up in 2 weeks in office.      Please do not hesitate to call me if additional questions arise.      Theresia Bough, NP   12/24/2021

## 2021-12-24 NOTE — Progress Notes (Signed)
Progress  Notes by Wanda Plump, Caren Griffins at 12/24/21 5465                Author: Wanda Plump, Caren Griffins  Service: CASE MANAGEMENT  Author Type: Social Worker       Filed: 12/24/21 1106  Date of Service: 12/24/21 0905  Status: Signed          Editor: Wanda Plump, Caren Griffins (Social Worker)               DC Plan: Home with son and Gordon ([place]:         Transportation: [facility] 1pm      CM reviewed nurse Clair Gulling note from this morning. Apparently, transportation was running behind and could not pick up the pt until 1:45am. Son was upset and wanted to take pt home, however the nurse informed son it was not safe to discharge him home  because he is legally blind and max assist. In addition, it looks like the son expressed he felt pressured by case management in accepting rehab.       Writer never pressured the pt or son into rehab. Writer  explained to pt and son PT's recommendation for SNF. On Monday, when writer spoke with the son, he indicated he wanted to take his father home with home health. Writer set up home health for the  pt and the pt was agreeable with that dc plan. On Tuesday, CM was notified by hospital staff the pt wanted to go to SNF, so writer followed up with the son and pt. Pt wanted to go to [facility].       Writer called pt's son Brave Dack [phone] this morning. Son was upset regarding transportation. Son wants to take pt home and have home health for him. Cm informed him that [facility] was already set up for pt since that was the  plan on Monday. Transportation home was discussed. Son would like the hospital to arrange transport home. Son is okay with discharge anytime after 12pm. Son is requesting we call him when transport is on the way to the hospital. Cm explained to son we  will not know when transport is on the way, however we can call him once transport arrives to the unit. Son was agreeable with plan.       Cm called [facility] and set  up transport for a 1pm pick up. Trip# [id number]      CM spoke with pt's nurse after morning IDR and updated her. Cm asked pt's nurse to call pt's son when transportation arrives.    Cm met with pt at the bedside and updated him on his dc plan. Pt advocate was present during the discussion. Patient advocate informed writer that if transportation does not arrive by 2pm, the son would like for [place] to call him and the son will coordinate  transportation. Cm spoke with pt's nurse again and updated her. Cm asked nurse to call writer if transportation is not here by 2pm so Probation officer can call pt's son.       Discharge plan of care/case management plan validated with provider's discharge order.

## 2021-12-24 NOTE — Discharge Summary (Signed)
Discharge Summary by Waldron Labs, PA at 12/24/21 1146                Author: Waldron Labs, PA  Service: Physician Assistant  Author Type: Physician Assistant       Filed: 12/24/21 1146  Date of Service: 12/24/21 1146  Status: Signed           Editor: Waldron Labs, PA (Physician Assistant)  Cosigner: Arnette Felts, MD at 12/24/21 1614                                                      Hospitalist Discharge Summary        Patient ID:     Carlos Petersen   [id number]   71 y.o.   04/01/1951      Admit date: 12/17/2021      Discharge date : 12/24/2021         Final Diagnoses:    Active Problems:     Hip fracture (HCC) (12/17/2021)            Reason for Hospitalization/Hospital Course:    Carlos Petersen is a 71 y.o. male past medical history of COPD, diabetes, heart disease, hypertension, hyperlipidemia, Parkinson's disease,  pulmonary embolism on anticoagulation that presented to the ED on 12/17/2021 after sustaining ground-level fall 2 days prior.  In the ED x-ray  of the femur and pelvis showed an acute mildly displaced subcapital fracture of the left femoral neck.  Laboratory data showed a hemoglobin 8.1.  Vital signs stable.   Requested admission for ORIF of the left hip. Ortho consulted. left ORIF performed on 12/18/2021. Cardiology consulted for clearance. ECHO shows EF 60-65%.  Patient's hemoglobin is 4.3.  Unsure  of the accuracy of the results however he was  was transfused 1 unit packed red blood cells.  There is only 200 cc blood loss per operative note.  Iron studies revealed low iron; low TIBC, and low  Iron% saturation. He was started on iron supplements.      Patient medically stable for discharge. Follow-up 2 weeks at the orthopedic clinic. Follow up with primary care in 10-14 days for careful titration of medications and management of comorbid condition. CBC needs to be performed while on eliquis to monitor  Hgb.       Discharge Medications:      Current Discharge Medication List                  START taking these medications          Details        ferrous sulfate 325 mg (65 mg iron) tablet  Take 1 Tablet by mouth daily (with breakfast) for 30 days.   Qty: 30 Tablet, Refills: 0   Start date: 12/24/2021, End date: 01/23/2022               celecoxib (CELEBREX) 200 mg capsule  Take 1 Capsule by mouth two (2) times daily as needed for Pain for up to 5 days.   Qty: 10 Capsule, Refills: 0   Start date: 12/23/2021, End date: 12/28/2021               oxyCODONE IR (ROXICODONE) 5 mg immediate release tablet  Take 1 Tablet by mouth every six (6) hours as needed for Pain  for up to 3 days. Max Daily Amount: 20 mg.   Qty: 12 Tablet, Refills: 0   Start date: 12/23/2021, End date: 12/26/2021          Associated Diagnoses: Closed fracture of neck of left femur, initial encounter (HCC)                        CONTINUE these medications which have CHANGED          Details        gabapentin (NEURONTIN) 100 mg capsule  Take 1 Capsule by mouth three (3) times daily. Max Daily Amount: 300 mg.   Qty: 60 Capsule, Refills: 2   Start date: 12/23/2021          Associated Diagnoses: Type 2 diabetes mellitus with diabetic neuropathy, without long-term current use  of insulin (HCC)                        CONTINUE these medications which have NOT CHANGED          Details        omeprazole (PRILOSEC) 40 mg capsule  Take 1 Capsule by mouth daily.               albuterol (PROVENTIL HFA, VENTOLIN HFA, PROAIR HFA) 90 mcg/actuation inhaler  Take 2 Puffs by inhalation every six (6) hours as needed for Wheezing.               alendronate (FOSAMAX) 70 mg tablet  Take 1 Tablet by mouth every Wednesday.               rosuvastatin (CRESTOR) 20 mg tablet  Take 1 Tablet by mouth nightly for 360 days.   Qty: 90 Tablet, Refills: 3          Associated Diagnoses: PAD (peripheral artery disease) (HCC)               ranolazine ER (RANEXA) 500 mg SR tablet  Take 1 Tablet by mouth two (2) times a day.   Qty: 180 Tablet, Refills: 3          Associated  Diagnoses: Coronary artery disease involving native coronary artery of native heart without  angina pectoris               apixaban (Eliquis) 5 mg tablet  Take 1 Tablet by mouth two (2) times a day.   Qty: 180 Tablet, Refills: 3          Associated Diagnoses: Longstanding persistent atrial fibrillation (HCC)               carbidopa-levodopa ER (SINEMET CR) 50-200 mg per tablet  Take 2 Tablets by mouth three (3) times daily for 360 days.   Qty: 540 Tablet, Refills: 3          Associated Diagnoses: Parkinson disease (HCC)               carvediloL (COREG) 6.25 mg tablet  Take 1 Tablet by mouth two (2) times a day.   Qty: 180 Tablet, Refills: 3          Associated Diagnoses: Chronic congestive heart failure, unspecified heart failure type (HCC); Longstanding  persistent atrial fibrillation (HCC)               amiodarone (CORDARONE) 200 mg tablet  Take 1 Tablet by mouth daily.   Qty: 90 Tablet, Refills: 3  Associated Diagnoses: Longstanding persistent atrial fibrillation (HCC)               fluticasone-umeclidinium-vilanterol (Trelegy Ellipta) 100-62.5-25 mcg inhaler  INHALE 1 PUFF BY MOUTH DAILY.   Qty: 1 Each, Refills: 5          Associated Diagnoses: Panlobular emphysema (HCC)                        STOP taking these medications                  empagliflozin (Jardiance) 10 mg tablet  Comments:    Reason for Stopping:                      vortioxetine (Trintellix) 20 mg tablet  Comments:    Reason for Stopping:                                Follow up Care:     1. [place] Linward Natal, Harrison Mons, MD in 1-2 weeks.          Follow-up Information                  Follow up With  Specialties  Details  Why  Contact Info              [place] Roselyn Meier, MD  Internal Medicine Physician  Follow up in 2 week(s)  As needed, If symptoms worsen  [address] [facility] [address]   [place]   [place] [address]   [phone]                 Smith Mince, MD  Orthopedic Surgery  Follow up on 01/01/2022  Post-op follow up  appointment on Jan 01, 2022 at 2:00 pm. It will be with Dr. Gerda Diss.  [address] [facility] [employer]   [address]   [place] [address]   [phone]                           Patient Follow Up Instructions:    Activity: Activity as tolerated   Diet:  Cardiac Diet   Wound Care:  Keep the wound covered at all times.  Do not wet the wound.       Condition at Discharge:  Stable   __________________________________________________________________      Disposition   Home Health Care Svc   ____________________________________________________________________      Code Status:   DNR   ___________________________________________________________________      Discharge Exam:   Patient seen and examined by me on discharge day.   Pertinent Findings:   Gen:    Not in distress   Chest: Clear lungs   CVS:   Regular rhythm.  No edema   Abd:  Soft, not distended, not tender   Neuro:  Alert            CONSULTATIONS: Cardiology and Orthopedic Surgery      Significant Diagnostic Studies:      Recent Results (from the past 24 hour(s))     GLUCOSE, POC          Collection Time: 12/23/21 12:22 PM         Result  Value  Ref Range            Glucose (POC)  137 (H)  65 - 100  mg/dL       Performed by  Hansel Feinstein         GLUCOSE, POC          Collection Time: 12/23/21  4:53 PM         Result  Value  Ref Range            Glucose (POC)  103 (H)  65 - 100 mg/dL       Performed by  Charlann Lange         GLUCOSE, POC          Collection Time: 12/23/21  9:22 PM         Result  Value  Ref Range            Glucose (POC)  118 (H)  65 - 100 mg/dL       Performed by  Orpha Bur         GLUCOSE, POC          Collection Time: 12/24/21  8:02 AM         Result  Value  Ref Range            Glucose (POC)  128 (H)  65 - 100 mg/dL       Performed by  Hart Rochester         GLUCOSE, POC          Collection Time: 12/24/21 11:27 AM         Result  Value  Ref Range            Glucose (POC)  132 (H)  65 - 100 mg/dL            Performed by  Hansel Feinstein             DUPLEX LOWER EXT VENOUS BILAT       Final Result            XR HIP LT W OR WO PELV 2-3 VWS       Final Result     Left hip hemiarthroplasty with expected immediate postoperative appearance.            XR CHEST PORT       Final Result     Stable minimal atelectasis or scar. Stable right midlung field parenchymal     opacity. No new acute disease.                 XR FEMUR LT 2 V       Final Result          1. Acute, mildly displaced subcapital fracture of the LEFT femoral neck.          2. No additional acute fracture of the LEFT femur is identified.            XR PELV AP ONLY       Final Result          1. Acute, mildly displaced subcapital fracture of the LEFT femoral neck.          2. No additional acute fracture of the LEFT femur is identified.                  Time spent in direct and indirect care including coordination of services: 30 minutes      Signed:   Waldron Labs, [place]   12/24/2021   1:48 PM      This is dictation was done  by dragon, Acupuncturist.  Quite often unanticipated grammatical, syntax, homophones and other interpretive errors or inadvertently transcribed by the  computer software.  Please excuse errors that have escaped final proofreading.  Thank you.

## 2021-12-24 NOTE — Progress Notes (Signed)
Progress Notes by Hart Rochester, RN at 12/24/21 1356                Author: Hart Rochester, RN  Service: --  Author Type: Registered Nurse       Filed: 12/24/21 1358  Date of Service: 12/24/21 1356  Status: Signed          Editor: Hart Rochester, RN (Registered Nurse)               Discharge plan of care/case management plan validated with provider discharge order. Pt given discharge instructions, doctor education and follow up appointments, education on possible  emergency, diet, and actively. Pt verbalized understanding, questions asked and answered. Belongings returned to pt, pt wheeled down with [facility] transportation, Son, Beau, called and informed of fathers pick up.

## 2021-12-24 NOTE — Progress Notes (Signed)
Pt is discharged and awaiting [facility] transport. Notified by [facility] they are running late and pt will be picked up at 1:45 am. Patient's son called unit and was upset that his dad would be transferred late. He came to the unit and was adamant he is taking his dad home and that he doesn't want him to go to rehab. I explained to him its not safe to sent him home because he is legally blind and needs maximum assistance. Supervisor notified. Per supervisor son is very upset about pt care during the days and that his dad's phone was packed up with his belonging and the pt was told that he is going to leave he doesn't need the phone.Son said he feels pressured by case manager in accepting rehab but neither he nor the patient wants rehab. Agreement made for patient to stay overnight and to discuss a different plan of care with case management in the morning. Patient advocate to be made aware by supervisor. MD [place] notified. Attempts made to notify [facility] rehab. Same unsuccessful.

## 2021-12-25 NOTE — Progress Notes (Signed)
Progress Notes by Wiliam Ke, RN at 12/25/21 [phone]                Author: Wiliam Ke, RN  Service: --  Author Type: Registered Nurse       Filed: 12/25/21 1229  Encounter Date: 12/25/2021  Status: Signed          Editor: Wiliam Ke, RN (Registered Nurse)               Care Transitions Initial Call      Call within 2 business days of discharge: Yes       Patient Current Location: [place]      Patient: Carlos Petersen Patient DOB:  10/05/1950 MRN: [id number]        Last Discharge [facility] Facility                   Date  Complaint  Diagnosis Description  Type  Department  Provider               12/17/21  Fall  Closed fracture of neck of left femur, initial encounter (HCC) ...  ED to Hosp-Admission (Discharged) (ADMIT)  [id number]  Arnette Felts, MD; [facility] [facility]...                     Was this an external facility discharge? No Discharge Facility: [facility]        Challenges to be reviewed by the provider        Additional needs identified to be addressed with provider: yes   Hospitalized with orif of hip, low hgb, transfusion of prbc, at home with son and [facility] to follow. Discharged on ferrous sulfate, celebrex and oxycodone                    Method of communication with provider : chart routing      Discussed COVID-19 related testing which was not done at this time.    Test results were not done.    Patient informed of results, if available? Not done       Advance Care Planning:    Does patient have an Advance Directive: not on file. Ctn to discuss prior to end of toc      Inpatient Readmission Risk score: Unplanned Readmit Risk Score: 14.6      Was this a readmission? no    Patient stated reason for the admission: I broke my hip      Patients top risk factors for readmission: functional cognitive ability, functional physical ability, medical condition-s/p orif hip, medication management, and polypharmacy    Interventions to address risk factors: Scheduled appointment with Specialist-01/01/22  ortho follow up, Obtained and reviewed discharge summary and/or  continuity of care documents, and Assistance in accessing community resources-gave son the number to dispatch health and has [facility] coming to see patient with pt, ot and sn      Care Transition Nurse (CTN) contacted the family by telephone to perform post hospital discharge assessment. Verified name and DOB with family as identifiers. Provided introduction to self, and explanation of the CTN role.       CTN reviewed discharge instructions, medical action plan and red flags with family who verbalized understanding. Were discharge instructions available to patient? yes. Reviewed appropriate site of care based on symptoms and resources available to patient  including: PCP, Specialist, Urgent Care Clinics, and Home Health. Family given an opportunity to ask questions and does  not have any further questions or concerns at this time. The family agrees to contact the PCP office for questions related to their  healthcare.       Medication reconciliation was performed with family, who verbalizes understanding of administration of home medications. Advised obtaining a 90-day supply of all daily and as-needed medications.    Referral to Pharm D needed: no      Home Health/Outpatient orders at discharge: home health care, PT, OT, and SN   Home Health company: [facility]   Date of initial visit: 12/25/21      Durable Medical Equipment ordered at discharge:  patient has hospital bed, WC, bedside commode, walker         Was patient discharged with a pulse oximeter? no      Discussed follow-up appointments. If no appointment was previously scheduled, appointment scheduling offered: yes. Is follow up appointment scheduled  within 7 days of discharge? yes.    [facility] follow up appointment(s):      Future Appointments           Date  Time  Provider  Department  Center           01/01/2022   2:00 PM  Helene Kelp, MD  [facility]  [facility]        Non-[facility] follow up appointment(s): none at this  time      Plan for follow-up call in 5-7 days based on severity of symptoms and risk factors.   Plan for next call: self management-out of bed more often, skin check with sacrum and incision, home health   CTN provided contact information for future needs.           Goals Addressed                                      This Visit's Progress              ?  Prevent complications post hospitalization.                   12/25/21     Using teachback patient son confirmed understanding of getting patient out of bed 3 x a day to facilitate better lung expansion,  help  prevent skin breakdown, increase strength     Using teachback patient son confirmed understanding of new medication of iron, celebrex and oxycodone for pain.      Using teachback patient confirmed understanding of monitoring dressing and incision for redness, swelling, drainage     Using teachback patient son comfirmed home health starting today to help care for patient.      Using teachback patient son confirmed understanding of monitoring leg for signs of dvt including pain in calf, swelling, redness and heat.      Using teachback patient confirmed understanding of monitoring for fever and pushing po fluids to ensure adequate hydration.      Ctn to discuss acp prior to end of toc.      Ctn to follow up in one week     Mickeal Skinner RN

## 2021-12-25 NOTE — Telephone Encounter (Signed)
Lilia Pro, nurse with [facility] would like to know when the Prevna drain needs to be taken off and what happen if it messes up. Please call with instructions. [phone] Patient's surgery was on the 21rst of April.

## 2021-12-28 NOTE — Telephone Encounter (Signed)
Called Carlos Petersen from [facility] and explained as per Dr. Tamala Julian the drain can be removed today. Carlos Petersen stated that no one can remove it today it would have to be tomorrow. I stated that that was fine and that Dr. Tamala Julian stated to place a sterile dressing on the wound. Carlos Petersen stated that she understood.

## 2022-01-01 ENCOUNTER — Encounter: Primary: Internal Medicine

## 2022-01-11 NOTE — Care Coordination-Inpatient (Signed)
[facility] Care Transitions Follow Up Call    Patient Current Location:  [place]    Care Transition Nurse contacted the family by telephone to follow up after admission on 12/17/21.  Verified name and DOB with family as identifiers.    Patient: Carlos Petersen  Patient DOB: [birth year]   MRN: [id number]  Reason for Admission: broken hip  Discharge Date: 12/24/21 RARS: No data recorded    Needs to be reviewed by the provider   Additional needs identified to be addressed with provider: No  none             Method of communication with provider: phone.    Patient doing better per son. Ctn made appt for 01/15/22 for staple removal and x rays with [facility]. Patient is using walker consistently and eating and drinking better with no constipation or urination issues. Patient son is limiting diet pepsi to one can a day and encouraging po intake with gatorade. Patient refuses water. Ctn will follow up in apprx. One week.  Mickeal Skinner RN- Care Transition Nurse- [phone]     Addressed changes since last contact:   staple removal  Discussed follow-up appointments. If no appointment was previously scheduled, appointment scheduling offered: Yes.   Is follow up appointment scheduled within 7 days of discharge? Yes.    Follow Up  Future Appointments   Date Time Provider Department Center   01/15/2022  1:00 PM Helene Kelp, MD [facility]     Non-[facility] follow up appointment(s): none at this time    Care Transition Nurse reviewed medical action plan with family and discussed any barriers to care and/or understanding of plan of care after discharge. Discussed appropriate site of care based on symptoms and resources available to patient including: Specialist  Home health. The family agrees to contact the PCP office for questions related to their healthcare.     Advance Care Planning:   not on file.     Patients top risk factors for readmission: functional physical ability, lack  of knowledge about disease, medication management, and polypharmacy  Interventions to address risk factors: Scheduled appointment with Specialist-/19/23 r/s from 01/01/22 and Obtained and reviewed discharge summary and/or continuity of care documents    Offered patient enrollment in the Remote Patient Monitoring (RPM) program for in-home monitoring: NA.     Care Transitions Subsequent and Final Call    Schedule Follow Up Appointment with PCP: Completed  Subsequent and Final Calls  Do you have any ongoing symptoms?: No  Have your medications changed?: No  Do you have any questions related to your medications?: No  Do you currently have any active services?: Yes  Are you currently active with any services?: Home Health  Do you have any needs or concerns that I can assist you with?: Yes  Patient-reported Needs or Concerns: follow up for staple removal, and appt made 01/15/22 at 1pm  Identified Barriers: Lack of Education  Care Transitions Interventions  No Identified Needs  Other Interventions:             Care Transition Nurse provided contact information for future needs. Plan for follow-up call in 5-7 days based on severity of symptoms and risk factors.  Plan for next call: follow-up appointment-incision monitoirng, activitiy level, home health dc    Wiliam Ke, RN

## 2022-01-13 ENCOUNTER — Encounter

## 2022-01-14 NOTE — Telephone Encounter (Signed)
Patient's son said that patient has been vomiting. Advised since Dr. Renaldo Reel out of the office to go to urgent care for eval and treatment.

## 2022-01-15 ENCOUNTER — Inpatient Hospital Stay
Admit: 2022-01-15 | Discharge: 2022-01-16 | Disposition: A | Discharge status: Home / Self Care | Payer: MEDICARE | Attending: Student in an Organized Health Care Education/Training Program

## 2022-01-15 ENCOUNTER — Emergency Department: Admit: 2022-01-15 | Payer: MEDICARE | Primary: Internal Medicine

## 2022-01-15 ENCOUNTER — Encounter: Payer: MEDICARE | Primary: Internal Medicine

## 2022-01-15 DIAGNOSIS — E876 Hypokalemia: Secondary | ICD-10-CM

## 2022-01-15 LAB — EKG 12-LEAD
Atrial Rate: 78 {beats}/min
P Axis: 70 degrees
P-R Interval: 238 ms
Q-T Interval: 474 ms
QRS Duration: 134 ms
QTc Calculation (Bazett): 540 ms
R Axis: 37 degrees
T Axis: 45 degrees
Ventricular Rate: 78 {beats}/min

## 2022-01-15 LAB — CBC WITH AUTO DIFFERENTIAL
Absolute Immature Granulocyte: 0.1 10*3/uL — ABNORMAL HIGH (ref 0.00–0.04)
Basophils %: 0 % (ref 0–1)
Basophils Absolute: 0 10*3/uL (ref 0.0–0.1)
Eosinophils %: 1 % (ref 0–7)
Eosinophils Absolute: 0.1 10*3/uL (ref 0.0–0.4)
Hematocrit: 25.8 % — ABNORMAL LOW (ref 36.6–50.3)
Hemoglobin: 7.7 g/dL — ABNORMAL LOW (ref 12.1–17.0)
Immature Granulocytes: 1 % — ABNORMAL HIGH (ref 0–0.5)
Lymphocytes %: 22 % (ref 12–49)
Lymphocytes Absolute: 1.6 10*3/uL (ref 0.8–3.5)
MCH: 24.1 PG — ABNORMAL LOW (ref 26.0–34.0)
MCHC: 29.8 g/dL — ABNORMAL LOW (ref 30.0–36.5)
MCV: 80.6 FL (ref 80.0–99.0)
MPV: 8.6 FL — ABNORMAL LOW (ref 8.9–12.9)
Monocytes %: 10 % (ref 5–13)
Monocytes Absolute: 0.7 10*3/uL (ref 0.0–1.0)
Neutrophils %: 66 % (ref 32–75)
Neutrophils Absolute: 4.7 10*3/uL (ref 1.8–8.0)
Nucleated RBCs: 0 PER 100 WBC
Platelets: 182 10*3/uL (ref 150–400)
RBC: 3.2 M/uL — ABNORMAL LOW (ref 4.10–5.70)
RDW: 20 % — ABNORMAL HIGH (ref 11.5–14.5)
WBC: 7.1 10*3/uL (ref 4.1–11.1)
nRBC: 0 10*3/uL (ref 0.00–0.01)

## 2022-01-15 LAB — COMPREHENSIVE METABOLIC PANEL
ALT: <6 U/L — ABNORMAL LOW (ref 12–78)
AST: 18 U/L (ref 15–37)
Albumin/Globulin Ratio: 0.4 — ABNORMAL LOW (ref 1.1–2.2)
Albumin: 2 g/dL — ABNORMAL LOW (ref 3.5–5.0)
Alk Phosphatase: 80 U/L (ref 45–117)
Anion Gap: 7 mmol/L (ref 5–15)
BUN: 8 mg/dL (ref 6–20)
Bun/Cre Ratio: 17 (ref 12–20)
CO2: 25 mmol/L (ref 21–32)
Calcium: 7.5 mg/dL — ABNORMAL LOW (ref 8.5–10.1)
Chloride: 102 mmol/L (ref 97–108)
Creatinine: 0.47 mg/dL — ABNORMAL LOW (ref 0.70–1.30)
Est, Glom Filt Rate: >60 mL/min/{1.73_m2} (ref >60)
Globulin: 4.6 g/dL — ABNORMAL HIGH (ref 2.0–4.0)
Glucose: 91 mg/dL (ref 65–100)
Potassium: 3 mmol/L — ABNORMAL LOW (ref 3.5–5.1)
Sodium: 134 mmol/L — ABNORMAL LOW (ref 136–145)
Total Bilirubin: 0.5 mg/dL (ref 0.2–1.0)
Total Protein: 6.6 g/dL (ref 6.4–8.2)

## 2022-01-15 LAB — TROPONIN: Troponin, High Sensitivity: 6 ng/L (ref 0–76)

## 2022-01-15 LAB — TYPE AND SCREEN
ABO/Rh: A NEG
Antibody Screen: NEGATIVE

## 2022-01-15 LAB — MAGNESIUM: Magnesium: 2 mg/dL (ref 1.6–2.4)

## 2022-01-15 LAB — LACTIC ACID: Lactic Acid, Plasma: 1.3 mmol/L (ref 0.4–2.0)

## 2022-01-15 LAB — PHOSPHORUS: Phosphorus: 3.6 mg/dL (ref 2.6–4.7)

## 2022-01-15 MED ORDER — CEFDINIR 300 MG PO CAPS
300 MG | ORAL_CAPSULE | Freq: Two times a day (BID) | ORAL | 0 refills | Status: AC
Start: 2022-01-15 — End: 2022-01-22

## 2022-01-15 MED ORDER — STERILE WATER FOR INJECTION (MIXTURES ONLY)
1 g | INTRAMUSCULAR | Status: AC
Start: 2022-01-15 — End: 2022-01-15
  Administered 2022-01-15: 23:00:00 1000 mg via INTRAVENOUS

## 2022-01-15 MED ORDER — POTASSIUM & SODIUM PHOSPHATES 280-160-250 MG PO PACK
280-160-250 MG | Freq: Once | ORAL | Status: AC
Start: 2022-01-15 — End: 2022-01-15
  Administered 2022-01-15: 20:00:00 500 via ORAL

## 2022-01-15 MED ORDER — ONDANSETRON HCL 4 MG/2ML IJ SOLN
4 MG/2ML | Freq: Once | INTRAMUSCULAR | Status: AC
Start: 2022-01-15 — End: 2022-01-15
  Administered 2022-01-15: 18:00:00 4 mg via INTRAVENOUS

## 2022-01-15 MED ORDER — ONDANSETRON HCL 4 MG PO TABS
4 MG | ORAL_TABLET | Freq: Three times a day (TID) | ORAL | 0 refills | Status: AC | PRN
Start: 2022-01-15 — End: 2022-02-01

## 2022-01-15 MED ORDER — SODIUM CHLORIDE 0.9 % IV BOLUS
0.9 % | Freq: Once | INTRAVENOUS | Status: AC
Start: 2022-01-15 — End: 2022-01-15
  Administered 2022-01-15: 18:00:00 500 mL via INTRAVENOUS

## 2022-01-15 MED FILL — CEFTRIAXONE SODIUM 1 G IJ SOLR: 1 g | INTRAMUSCULAR | Qty: 1000

## 2022-01-15 MED FILL — SODIUM CHLORIDE 0.9 % IV SOLN: 0.9 % | INTRAVENOUS | Qty: 500

## 2022-01-15 MED FILL — PHOS-NAK 280-160-250 MG PO PACK: 280-160-250 MG | ORAL | Qty: 2

## 2022-01-15 MED FILL — ONDANSETRON HCL 4 MG/2ML IJ SOLN: 4 MG/2ML | INTRAMUSCULAR | Qty: 2

## 2022-01-15 NOTE — ED Notes (Signed)
Pt voices understanding of all dc and follow up instructions given.  [facility] here to transport patient home.      Caryn Bee, RN  01/15/22 2148

## 2022-01-15 NOTE — Progress Notes (Unsigned)
Orthopedic Ambulatory Note    Date:01/14/2022         Patient Name:Carlos Petersen     Date of Birth:December 14, 1950     Age:71 y.o.      male     SUBJECTIVE    SURGERY: Left Hemi arthroplasty, uncemented  SURGEON: Dr. Polo Riley  DOS: 12/18/2021    01/15/22: Patient is 4 weeks status post left hemiarthroplasty.        Physical Exam   AAOx  Non-labored Respirations  Abdomen Non-tender      IMAGING   Radiographs were independently reviewed and interpreted.      ASSESSMENT/PLAN           {There are no diagnoses linked to this encounter. (Refresh or delete this SmartLink)}       Radiographs at Follow-Up:   Weight Bearing Status:   DVT Proph:  Follow-Up:  Radiographs at Follow-Up:         Electronically signed by Helene Kelp, MD on 01/14/2022 at 12:08 PM

## 2022-01-15 NOTE — ED Triage Notes (Signed)
Pt sent from home with increased weakness, nausea,vomiting. Had hip replacement approx 3 weeks ago. Denies pain, but states he cannot stand. Burning with urination.

## 2022-01-15 NOTE — ED Notes (Signed)
Writer rounded on pt, pt in bed resting with eyes closed at this time.  Pt denies pain at this time, no new orders received at this time, plan of care continues.     Bruce Donath, RN  01/15/22 1537

## 2022-01-15 NOTE — ED Provider Notes (Shared)
[facility] EMER[facility] DEPT  EMERGENCY DEPARTMENT HISTORY AND PHYSICAL EXAM      Date: 01/15/2022  Patient Name: Carlos Petersen  MRN: [id number]  Birthdate 1951-04-06  Date of evaluation: 01/15/2022  Provider: Lianne Moris, MD   Note Started: 2:44 PM EDT 01/15/22    HISTORY OF PRESENT ILLNESS     Chief Complaint   Patient presents with    Fatigue       History Provided By: Patient    HPI: Carlos Petersen is a 71 y.o. male with past medical history as reviewed below presents for evaluation of fatigue, nausea and dysuria.  Symptoms present intermittently over the last week.  He denies any fevers or chills, no abdominal pain, does have intermittent diarrhea.  No other complaints.    PAST MEDICAL HISTORY   Past Medical History:  Past Medical History:   Diagnosis Date    COPD (chronic obstructive pulmonary disease) (HCC)     Depression     Diabetes (HCC)     Emphysema lung (HCC)     Heart disease     Hypercholesterolemia     Parkinson disease (HCC)     Pulmonary embolism (HCC)        Past Surgical History:  Past Surgical History:   Procedure Laterality Date    CARDIAC DEFIBRILLATOR PLACEMENT      COLONOSCOPY      PACEMAKER      PACEMAKER PLACEMENT      TOTAL HIP ARTHROPLASTY Right 01/2016       Family History:  History reviewed. No pertinent family history.    Social History:  Social History     Tobacco Use    Smoking status: Every Day     Packs/day: 1.00     Types: Cigarettes    Smokeless tobacco: Never   Substance Use Topics    Alcohol use: Never    Drug use: Never       Allergies:  No Known Allergies    PCP: Herma Mering, MD    Current Meds:   Current Facility-Administered Medications   Medication Dose Route Frequency Provider Last Rate Last Admin    0.9 % sodium chloride bolus  500 mL IntraVENous Once Lianne Moris, MD 247.9 mL/hr at 01/15/22 1404 500 mL at 01/15/22 1404    potassium & sodium phosphates (PHOS-NAK) 280-160-250 MG packet 500 mg  2 packet Oral Once Lianne Moris, MD         Current  Outpatient Medications   Medication Sig Dispense Refill    alendronate (FOSAMAX) 70 MG tablet Take 1 tablet by mouth      albuterol sulfate HFA (PROVENTIL;VENTOLIN;PROAIR) 108 (90 Base) MCG/ACT inhaler Inhale 2 puffs into the lungs every 6 hours as needed      amiodarone (CORDARONE) 200 MG tablet Take 1 tablet by mouth daily      apixaban (ELIQUIS) 5 MG TABS tablet Take 1 tablet by mouth 2 times daily      carbidopa-levodopa (SINEMET CR) 50-200 MG per extended release tablet Take 2 tablets by mouth 3 times daily 180 tablet 11    carvedilol (COREG) 6.25 MG tablet Take 1 tablet by mouth 2 times daily      celecoxib (CELEBREX) 200 MG capsule       JARDIANCE 10 MG tablet       ferrous sulfate (IRON 325) 325 (65 Fe) MG tablet Take 1 tablet by mouth daily (with breakfast) 30 tablet 0    gabapentin (NEURONTIN)  100 MG capsule Take 1 capsule by mouth 3 times daily.      omeprazole (PRILOSEC) 40 MG delayed release capsule Take 1 capsule by mouth daily      oxyCODONE (ROXICODONE) 5 MG immediate release tablet       ranolazine (RANEXA) 500 MG extended release tablet Take 1 tablet by mouth 2 times daily      rosuvastatin (CRESTOR) 20 MG tablet Take 1 tablet by mouth         Social Determinants of Health:   Social Determinants of Health     Tobacco Use: High Risk    Smoking Tobacco Use: Every Day    Smokeless Tobacco Use: Never    Passive Exposure: Not on file   Alcohol Use: Not At Risk    Frequency of Alcohol Consumption: Never    Average Number of Drinks: Patient does not drink    Frequency of Binge Drinking: Never   Physicist, medical Strain: Not on file   Food Insecurity: Not on file   Transportation Needs: Not on file   Physical Activity: Not on file   Stress: Not on file   Social Connections: Not on file   Intimate Partner Violence: Not on file   Depression: Not at risk    PHQ-2 Score: 0   Housing Stability: Not on file       PHYSICAL EXAM   Physical Exam  HENT:      Head: Normocephalic and atraumatic.      Mouth/Throat:       Mouth: Mucous membranes are moist.   Eyes:      Extraocular Movements: Extraocular movements intact.      Pupils: Pupils are equal, round, and reactive to light.   Cardiovascular:      Rate and Rhythm: Normal rate and regular rhythm.   Pulmonary:      Effort: Pulmonary effort is normal.      Breath sounds: Normal breath sounds.   Abdominal:      Palpations: Abdomen is soft.      Tenderness: There is no abdominal tenderness.   Skin:     General: Skin is warm and dry.   Neurological:      General: No focal deficit present.      Mental Status: He is alert.         SCREENINGS               LAB, EKG AND DIAGNOSTIC RESULTS   Labs:  Recent Results (from the past 12 hour(s))   EKG 12 Lead    Collection Time: 01/15/22 12:34 PM   Result Value Ref Range    Ventricular Rate 78 BPM    Atrial Rate 78 BPM    P-R Interval 238 ms    QRS Duration 134 ms    Q-T Interval 474 ms    QTc Calculation (Bazett) 540 ms    P Axis 70 degrees    R Axis 37 degrees    T Axis 45 degrees    Diagnosis       Sinus rhythm with 1st degree A-V block  Non-specific intra-ventricular conduction block  Cannot rule out Anterior infarct , age undetermined  Abnormal ECG    Confirmed by Jomarie Longs MD, MATHEW ([id number]) on 01/15/2022 1:16:08 PM     CBC with Auto Differential    Collection Time: 01/15/22 12:56 PM   Result Value Ref Range    WBC 7.1 4.1 - 11.1 K/uL    RBC 3.20 (L)  4.10 - 5.70 M/uL    Hemoglobin 7.7 (L) 12.1 - 17.0 g/dL    Hematocrit 60.7 (L) 36.6 - 50.3 %    MCV 80.6 80.0 - 99.0 FL    MCH 24.1 (L) 26.0 - 34.0 PG    MCHC 29.8 (L) 30.0 - 36.5 g/dL    RDW 37.1 (H) 06.2 - 14.5 %    Platelets 182 150 - 400 K/uL    MPV 8.6 (L) 8.9 - 12.9 FL    Nucleated RBCs 0.0 0.0 PER 100 WBC    nRBC 0.00 0.00 - 0.01 K/uL    Neutrophils % 66 32 - 75 %    Lymphocytes % 22 12 - 49 %    Monocytes % 10 5 - 13 %    Eosinophils % 1 0 - 7 %    Basophils % 0 0 - 1 %    Immature Granulocytes 1 (H) 0 - 0.5 %    Neutrophils Absolute 4.7 1.8 - 8.0 K/UL    Lymphocytes Absolute 1.6 0.8 -  3.5 K/UL    Monocytes Absolute 0.7 0.0 - 1.0 K/UL    Eosinophils Absolute 0.1 0.0 - 0.4 K/UL    Basophils Absolute 0.0 0.0 - 0.1 K/UL    Absolute Immature Granulocyte 0.1 (H) 0.00 - 0.04 K/UL    Differential Type AUTOMATED     Comprehensive Metabolic Panel    Collection Time: 01/15/22 12:56 PM   Result Value Ref Range    Sodium 134 (L) 136 - 145 mmol/L    Potassium 3.0 (L) 3.5 - 5.1 mmol/L    Chloride 102 97 - 108 mmol/L    CO2 25 21 - 32 mmol/L    Anion Gap 7 5 - 15 mmol/L    Glucose 91 65 - 100 mg/dL    BUN 8 6 - 20 mg/dL    Creatinine 6.94 (L) 0.70 - 1.30 mg/dL    Bun/Cre Ratio 17 12 - 20      Est, Glom Filt Rate >60 >60 ml/min/1.53m2    Calcium 7.5 (L) 8.5 - 10.1 mg/dL    Total Bilirubin 0.5 0.2 - 1.0 mg/dL    AST 18 15 - 37 U/L    ALT <6 (L) 12 - 78 U/L    Alk Phosphatase 80 45 - 117 U/L    Total Protein 6.6 6.4 - 8.2 g/dL    Albumin 2.0 (L) 3.5 - 5.0 g/dL    Globulin 4.6 (H) 2.0 - 4.0 g/dL    Albumin/Globulin Ratio 0.4 (L) 1.1 - 2.2     Troponin    Collection Time: 01/15/22 12:56 PM   Result Value Ref Range    Troponin, High Sensitivity 6 0 - 76 ng/L   TYPE AND SCREEN    Collection Time: 01/15/22 12:56 PM   Result Value Ref Range    Crossmatch expiration date 01/18/2022,2359     ABO/Rh A Negative     Antibody Screen Negative    Lactic Acid    Collection Time: 01/15/22 12:56 PM   Result Value Ref Range    Lactic Acid, Plasma 1.3 0.4 - 2.0 mmol/L   Magnesium    Collection Time: 01/15/22 12:56 PM   Result Value Ref Range    Magnesium 2.0 1.6 - 2.4 mg/dL   Phosphorus    Collection Time: 01/15/22 12:56 PM   Result Value Ref Range    Phosphorus 3.6 2.6 - 4.7 mg/dL       EKG: Initial EKG interpreted  by me if performed. See ED course below    Radiologic Studies:  Non-plain film images such as CT, Ultrasound and MRI are read by the radiologist. Plain radiographic images are visualized and preliminarily interpreted by the ED Provider with findings available in ED course below.     Interpretation per the Radiologist  below, if available at the time of this note:  XR CHEST PORTABLE   Final Result   Very mild right basilar atelectasis.               EMERGENCY DEPARTMENT COURSE and DIFFERENTIAL DIAGNOSIS/MDM   CC/HPI Summary, DDx, ED Course, and Reassessment: 71 year old male presents for evaluation of dysuria and fatigue.  Physical exam benign with no significant abdominal tenderness, normal vitals with no evidence of sepsis.  Suspect dehydration versus electrolyte imbalance versus gastritis versus urinary tract infection will evaluate with CBC, CMP, UA.  Treating with Zofran.    ED Course as of 01/15/22 1444   Fri Jan 15, 2022   1258 KG performed at 1234 and interpreted by me shows sinus rhythm with first-degree AV block, no ST elevation or depression [BQ]      ED Course User Index  [BQ] Lianne Moris, MD       Records Reviewed (source and summary of external notes): Prior medical records and Nursing notes    Vitals:    Vitals:    01/15/22 1219 01/15/22 1356   BP: (!) 114/58 (!) 111/53   Pulse: 87 70   Resp: 20 18   Temp: 98.1 F (36.7 C)    TempSrc: Oral    SpO2: 99% 100%   Weight: 90.7 kg (200 lb)    Height: 1.803 m (5\' 11" )           Disposition Considerations (Tests not done, Shared Decision Making, Pt Expectation of Test or Treatment.):   Patient was given the following medications:  Medications   0.9 % sodium chloride bolus (500 mLs IntraVENous New Bag 01/15/22 1404)   potassium & sodium phosphates (PHOS-NAK) 280-160-250 MG packet 500 mg (has no administration in time range)   ondansetron (ZOFRAN) injection 4 mg (4 mg IntraVENous Given 01/15/22 1403)       CONSULTS: (Who and What was discussed)  None     Social Determinants affecting Dx or Tx:     PROCEDURES   Unless otherwise noted above, none  Procedures      CRITICAL CARE TIME       ED FINAL IMPRESSION     1. Hypokalemia          DISPOSITION/PLAN   DISPOSITION      Discharge Note: The patient is stable for discharge home. The signs, symptoms, diagnosis, and  discharge instructions have been discussed, understanding conveyed, and agreed upon. The patient is to follow up as recommended or return to ER should their symptoms worsen.      PATIENT REFERRED TO:  Herma Mering, MD  [address]  [place] [address]  [phone]              DISCHARGE MEDICATIONS:     Medication List        ASK your doctor about these medications      albuterol sulfate HFA 108 (90 Base) MCG/ACT inhaler  Commonly known as: PROVENTIL;VENTOLIN;PROAIR     alendronate 70 MG tablet  Commonly known as: FOSAMAX     amiodarone 200 MG tablet  Commonly known as: CORDARONE  apixaban 5 MG Tabs tablet  Commonly known as: ELIQUIS     carbidopa-levodopa 50-200 MG per extended release tablet  Commonly known as: SINEMET CR     carvedilol 6.25 MG tablet  Commonly known as: COREG     celecoxib 200 MG capsule  Commonly known as: CELEBREX     ferrous sulfate 325 (65 Fe) MG tablet  Commonly known as: IRON 325     gabapentin 100 MG capsule  Commonly known as: NEURONTIN     Jardiance 10 MG tablet  Generic drug: empagliflozin     omeprazole 40 MG delayed release capsule  Commonly known as: PRILOSEC     oxyCODONE 5 MG immediate release tablet  Commonly known as: ROXICODONE     ranolazine 500 MG extended release tablet  Commonly known as: RANEXA     rosuvastatin 20 MG tablet  Commonly known as: CRESTOR                DISCONTINUED MEDICATIONS:  Current Discharge Medication List          I am the Primary Clinician of Record. Lianne Moris, MD (electronically signed)    (Please note that parts of this dictation were completed with voice recognition software. Quite often unanticipated grammatical, syntax, homophones, and other interpretive errors are inadvertently transcribed by the computer software. Please disregards these errors. Please excuse any errors that have escaped final proofreading.)

## 2022-01-15 NOTE — Discharge Instructions (Addendum)
Call your orthopedic surgeon, the information is attached.  They can remove your sutures.      Thank you for allowing [place] to provide you with excellent care today. We hope we addressed all of your concerns and needs. We strive to provide excellent quality care in the Emergency Department. Anything less than excellent does not meet our expectations for you.       Incidental findings are findings on labs or imaging studies that do not necessarily correlate with the reason for your visit.  We make every effort to inform you of these incidental findings and the proper follow-up, however it is important that you call your primary care doctor or a primary care doctor in 1 to 2 days to set up a follow-up visit so they can go through your results in detail with you, and determine if additional testing is needed.  The emergency room is not intended to be complete or comprehensive care, and it serves as a means to rule out life-threatening pathologies.  It is very important that you follow-up with your primary care doctor to arrange additional testing and/or treatment if needed.    The exam and treatment you received in the Emergency Department were for an urgent problem and are not intended as complete care. It is important that you follow-up with a doctor, nurse practitioner, or physician assistant to:  (1) confirm your diagnosis,  (2) re-evaluation of changes in your illness and treatment, and  (3) for ongoing care.  If your symptoms become worse or you do not improve as expected, or you develop any new symptoms that concern you and/or you are unable to reach your usual health care provider, you should return to the Emergency Department. We are available 24 hours a day.     If your blood pressure is greater than 120/80, your blood pressure is considered elevated above normal and you need to follow up with your primary care physician or the physician provided on your discharge instructions for a blood pressure recheck.      If you were prescribed narcotic medications such as hydrocodone, oxycodone, diazepam, lorazepam, alprazolam, tramadol or codeine, these medications will NOT typically be refilled in the Emergency Room. Follow up with your primary care doctor or specialist to discuss this, or you may return to the Emergency room if your condition worsens.  XR HIP 2-3 VW W PELVIS LEFT   Final Result   No acute abnormality.      XR CHEST PORTABLE   Final Result   Very mild right basilar atelectasis.              Diagnosis:   1. Hypokalemia    2. Acute cystitis with hematuria    3. General weakness          Take this sheet with you when you go to your follow-up visit.   If you have any problem arranging the follow-up visit, contact [phone] ([phone])    Make an appointment with your Primary Care doctor for follow up of this visit. Return to the ER if you are unable to be seen in the time recommended on your discharge instructions.     It has been a pleasure caring for you today. Return to the ER or seek medical care for any worsening in your condition at any time.    MyChart Activation    Thank you for requesting access to MyChart. Please follow the instructions below to securely access and download your online medical  record. MyChart allows you to send messages to your doctor, view your test results, renew your prescriptions, schedule appointments, and more.    How Do I Sign Up?    In your internet browser, go to [email or web address]  Click on the First Time User? Click Here link in the Sign In box. You will be redirect to the New Member Sign Up page.  Enter your MyChart Access Code exactly as it appears below. You will not need to use this code after you've completed the sign-up process. If you do not sign up before the expiration date, you must request a new code.    MyChart Access Code: Activation code not generated  Current MyChart Status: Active (This is the date your MyChart access code will expire)    Enter the last four  digits of your Social Security Number (xxxx) and Date of Birth (mm/dd/yyyy) as indicated and click Submit. You will be taken to the next sign-up page.  Create a MyChart ID. This will be your MyChart login ID and cannot be changed, so think of one that is secure and easy to remember.  Create a Clinical biochemist. You can change your password at any time.  Enter your Password Reset Question and Answer. This can be used at a later time if you forget your password.   Enter your e-mail address. You will receive e-mail notification when new information is available in MyChart.  Click Sign Up. You can now view and download portions of your medical record.  Click the [employer] link to download a portable copy of your medical information.    Additional Information    If you have questions, please visit the Frequently Asked Questions section of the MyChart website at [email or web address] Remember, MyChart is NOT to be used for urgent needs. For medical emergencies, dial 911.

## 2022-01-16 LAB — URINALYSIS WITH REFLEX TO CULTURE
Bilirubin Urine: NEGATIVE
Glucose, UA: NEGATIVE mg/dL
Ketones, Urine: 5 mg/dL — AB
Nitrite, Urine: NEGATIVE
Protein, UA: 100 mg/dL — AB
Specific Gravity, UA: 1.015 (ref 1.003–1.030)
Urobilinogen, Urine: 4 EU/dL — ABNORMAL HIGH (ref 0.1–1.0)
WBC, UA: >100 /hpf — ABNORMAL HIGH (ref 0–4)
pH, Urine: 7 (ref 5.0–8.0)

## 2022-01-18 LAB — CULTURE, URINE: Colony count: >100000

## 2022-01-21 ENCOUNTER — Encounter

## 2022-01-22 ENCOUNTER — Ambulatory Visit: Admit: 2022-01-22 | Discharge: 2022-01-22 | Payer: MEDICARE | Primary: Internal Medicine

## 2022-01-22 DIAGNOSIS — S72002D Fracture of unspecified part of neck of left femur, subsequent encounter for closed fracture with routine healing: Secondary | ICD-10-CM

## 2022-01-22 NOTE — Progress Notes (Signed)
Clinic today.  He is 4 weeks from hemiarthroplasty to the left hip.  The patient was recently discharged from the hospital.  Presented with hypotension and low potassium.  He was managed in ER and sent home.  The son tells me he has been very weak confused and he is not able to walk at all.  On examination the patient appears to be pale.  The wound at the left hip looks great no sign of any infection.  He is neurologically intact in both lower limbs.  Able to plantarflex dorsiflex both ankle and toes with normal strength.  X-ray not available.  I recommended to the son he goes back to ER to be evaluated by the son feels it is not necessary at this point.  Plan is to discontinue the staples apply dressing.  Follow-up 1 month with x-ray.  Weightbearing as tolerated both lower limbs with a walker and help.  Physical therapy.

## 2022-01-22 NOTE — Progress Notes (Signed)
Identified pt with two pt identifiers (name and DOB). Reviewed chart in preparation for visit and have obtained necessary documentation.    KENDALE REMBOLD is a 71 y.o. male  Chief Complaint   Patient presents with    Follow-up     Staple removal/hip fracture.      BP 125/73 (Site: Left Upper Arm, Position: Sitting)   Pulse 63   Temp 97.5 F (36.4 C) (Temporal)   Resp 16   Ht 5\' 11"  (1.803 m)   Wt 200 lb (90.7 kg)   SpO2 93%   BMI 27.89 kg/m     1. Have you been to the ER, urgent care clinic since your last visit?  Hospitalized since your last visit?Yes, [facility] on 01/15/2022.    2. Have you seen or consulted any other health care providers outside of the [facility] since your last visit?  Include any pap smears or colon screening. No

## 2022-01-26 DIAGNOSIS — R4182 Altered mental status, unspecified: Secondary | ICD-10-CM

## 2022-01-26 DIAGNOSIS — R197 Diarrhea, unspecified: Secondary | ICD-10-CM

## 2022-01-26 NOTE — ED Triage Notes (Signed)
Pt having diarrhea for a couple of weeks and is turning black. Son told ems he has not been the same since after hip surgery about a month ago.

## 2022-01-26 NOTE — ED Provider Notes (Incomplete)
[facility]  EMERGENCY DEPARTMENT HISTORY AND PHYSICAL EXAM      Date: 01/26/2022  Patient Name: Carlos Petersen  MRN: [id number]  Birthdate 07/10/1951  Date of evaluation: 01/26/2022  Provider: Vladimir Crofts, MD   Note Started: 11:41 PM EDT 01/26/22    HISTORY OF PRESENT ILLNESS     Chief Complaint   Patient presents with    Diarrhea       History Provided By: Patient    HPI: Carlos Petersen is a 71 y.o. male with PMH of DM, HLD, COPD, and PE on AC who comes to the ED with chief complaint of diarrhea.  He reports that he completed a course of antibiotics recently for treatment of urinary tract infection.  Currently, he does not have any symptoms suggestive of any tract infection.  No dysuria, hematuria, or increased frequency.  However, patient reported that has been having creased bowel movement for the last 2 weeks.  Is reporting 3-4 bowel movements every day that is described as loose, darker, and had a change in the smell of urine.  He denies any nausea, vomiting, anorexia, or abdominal pain.  Denies any chest pain, or shortness of breath from baseline.  Denies any recent illnesses and he does not have any other new concerns.  He does not recall the name of the antibiotics he was using for urinary tract infection.    PAST MEDICAL HISTORY   Past Medical History:  Past Medical History:   Diagnosis Date    COPD (chronic obstructive pulmonary disease) (HCC)     Depression     Diabetes (HCC)     Emphysema lung (HCC)     Heart disease     Hypercholesterolemia     Parkinson disease (HCC)     Pulmonary embolism (HCC)        Past Surgical History:  Past Surgical History:   Procedure Laterality Date    CARDIAC DEFIBRILLATOR PLACEMENT      COLONOSCOPY      PACEMAKER      PACEMAKER PLACEMENT      TOTAL HIP ARTHROPLASTY Right 01/2016       Family History:  History reviewed. No pertinent family history.    Social History:  Social History     Tobacco Use    Smoking status: Every Day     Packs/day: 0.50     Types:  Cigarettes    Smokeless tobacco: Never   Substance Use Topics    Alcohol use: Never    Drug use: Never       Allergies:  No Known Allergies    PCP: Herma Mering, MD    Current Meds:   No current facility-administered medications for this encounter.     Current Outpatient Medications   Medication Sig Dispense Refill    fluticasone-umeclidin-vilant (TRELEGY ELLIPTA) 100-62.5-25 MCG/ACT AEPB inhaler INHALE 1 PUFF BY MOUTH DAILY.      ondansetron (ZOFRAN) 4 MG tablet Take 1 tablet by mouth 3 times daily as needed for Nausea or Vomiting 15 tablet 0    alendronate (FOSAMAX) 70 MG tablet Take 1 tablet by mouth      albuterol sulfate HFA (PROVENTIL;VENTOLIN;PROAIR) 108 (90 Base) MCG/ACT inhaler Inhale 2 puffs into the lungs every 6 hours as needed      amiodarone (CORDARONE) 200 MG tablet Take 1 tablet by mouth daily      apixaban (ELIQUIS) 5 MG TABS tablet Take 1 tablet by mouth 2 times daily  carbidopa-levodopa (SINEMET CR) 50-200 MG per extended release tablet Take 2 tablets by mouth 3 times daily 180 tablet 11    carvedilol (COREG) 6.25 MG tablet Take 1 tablet by mouth 2 times daily      celecoxib (CELEBREX) 200 MG capsule       JARDIANCE 10 MG tablet       gabapentin (NEURONTIN) 100 MG capsule Take 1 capsule by mouth 3 times daily.      omeprazole (PRILOSEC) 40 MG delayed release capsule Take 1 capsule by mouth daily      oxyCODONE (ROXICODONE) 5 MG immediate release tablet  (Patient not taking: Reported on 01/22/2022)      ranolazine (RANEXA) 500 MG extended release tablet Take 1 tablet by mouth 2 times daily      rosuvastatin (CRESTOR) 20 MG tablet Take 1 tablet by mouth         Social Determinants of Health:   Social Determinants of Health     Tobacco Use: High Risk    Smoking Tobacco Use: Every Day    Smokeless Tobacco Use: Never    Passive Exposure: Not on file   Alcohol Use: Not At Risk    Frequency of Alcohol Consumption: Never    Average Number of Drinks: Patient does not drink    Frequency of Binge  Drinking: Never   Physicist, medical Strain: Not on file   Food Insecurity: Not on file   Transportation Needs: Not on file   Physical Activity: Not on file   Stress: Not on file   Social Connections: Not on file   Intimate Partner Violence: Not on file   Depression: Not at risk    PHQ-2 Score: 0   Housing Stability: Not on file       PHYSICAL EXAM   Physical Exam  Vitals and nursing note reviewed.   Constitutional:       General: He is not in acute distress.     Appearance: He is well-developed. He is not ill-appearing, toxic-appearing or diaphoretic.   HENT:      Head: Normocephalic and atraumatic.   Cardiovascular:      Rate and Rhythm: Normal rate and regular rhythm.   Pulmonary:      Effort: Pulmonary effort is normal.      Breath sounds: Normal breath sounds.   Abdominal:      General: There is no distension.      Palpations: Abdomen is soft. There is no mass.      Tenderness: There is no abdominal tenderness. There is no guarding or rebound.      Hernia: No hernia is present.   Musculoskeletal:      Cervical back: Normal range of motion and neck supple.   Skin:     General: Skin is warm and dry.   Neurological:      General: No focal deficit present.      Mental Status: He is alert and oriented to person, place, and time.       SCREENINGS               LAB, EKG AND DIAGNOSTIC RESULTS   Labs:  Recent Results (from the past 12 hour(s))   CBC with Auto Differential    Collection Time: 01/26/22 11:03 PM   Result Value Ref Range    WBC 10.7 4.1 - 11.1 K/uL    RBC 3.49 (L) 4.10 - 5.70 M/uL    Hemoglobin 8.6 (L) 12.1 - 17.0 g/dL  Hematocrit 29.9 (L) 36.6 - 50.3 %    MCV 85.7 80.0 - 99.0 FL    MCH 24.6 (L) 26.0 - 34.0 PG    MCHC 28.8 (L) 30.0 - 36.5 g/dL    RDW 02.7 (H) 25.3 - 14.5 %    Platelets 210 150 - 400 K/uL    MPV 8.5 (L) 8.9 - 12.9 FL    Nucleated RBCs 0.0 0.0 PER 100 WBC    nRBC 0.00 0.00 - 0.01 K/uL    Neutrophils % PENDING %    Lymphocytes % PENDING %    Monocytes % PENDING %    Eosinophils % PENDING %     Basophils % PENDING %    Immature Granulocytes PENDING %    Neutrophils Absolute PENDING K/UL    Lymphocytes Absolute PENDING K/UL    Monocytes Absolute PENDING K/UL    Eosinophils Absolute PENDING K/UL    Basophils Absolute PENDING K/UL    Absolute Immature Granulocyte PENDING K/UL    Differential Type PENDING        Radiologic Studies:  Non-plain film images such as CT, Ultrasound and MRI are read by the radiologist. Plain radiographic images are visualized and preliminarily interpreted by the ED Provider with the following findings: {Wet Read interpretation:58353}    Interpretation per the Radiologist below, if available at the time of this note:  No orders to display        EMERGENCY DEPARTMENT COURSE and DIFFERENTIAL DIAGNOSIS/MDM   CC/HPI Summary, DDx, ED Course, and Reassessment:     Clinical Management Tools:  {CMT List:60667::"Not Applicable"}    Records Reviewed (source and summary of external notes): Prior medical records and Nursing notes    Vitals:    Vitals:    01/26/22 2301   BP: (!) 121/52   Pulse: 91   Resp: 16   Temp: 98.5 F (36.9 C)   TempSrc: Oral   SpO2: 99%   Weight: 95.3 kg (210 lb)   Height: 1.803 m (5\' 11" )        ED COURSE       Disposition Considerations (Tests not done, Shared Decision Making, Pt Expectation of Test or Treatment.): {Dispo considerations:59518}    Patient was given the following medications:  Medications - No data to display    CONSULTS: (Who and What was discussed)  None     Social Determinants affecting Dx or Tx: {Social Determinants:58355}    Smoking Cessation: {smoking cessation smartlist:58351}    PROCEDURES   Unless otherwise noted above, none  Procedures      CRITICAL CARE TIME   {critical care smartlist:58349}    ED FINAL IMPRESSION   No diagnosis found.      DISPOSITION/PLAN   DISPOSITION      {ED Dispositions:58354}     PATIENT REFERRED TO:  No follow-up provider specified.      DISCHARGE MEDICATIONS:     Medication List        ASK your doctor about these  medications      albuterol sulfate HFA 108 (90 Base) MCG/ACT inhaler  Commonly known as: PROVENTIL;VENTOLIN;PROAIR     alendronate 70 MG tablet  Commonly known as: FOSAMAX     amiodarone 200 MG tablet  Commonly known as: CORDARONE     apixaban 5 MG Tabs tablet  Commonly known as: ELIQUIS     carbidopa-levodopa 50-200 MG per extended release tablet  Commonly known as: SINEMET CR     carvedilol 6.25 MG tablet  Commonly  known as: COREG     celecoxib 200 MG capsule  Commonly known as: CELEBREX     gabapentin 100 MG capsule  Commonly known as: NEURONTIN     Jardiance 10 MG tablet  Generic drug: empagliflozin     omeprazole 40 MG delayed release capsule  Commonly known as: PRILOSEC     ondansetron 4 MG tablet  Commonly known as: ZOFRAN  Take 1 tablet by mouth 3 times daily as needed for Nausea or Vomiting     oxyCODONE 5 MG immediate release tablet  Commonly known as: ROXICODONE     ranolazine 500 MG extended release tablet  Commonly known as: RANEXA     rosuvastatin 20 MG tablet  Commonly known as: CRESTOR     Trelegy Ellipta 100-62.5-25 MCG/ACT Aepb inhaler  Generic drug: fluticasone-umeclidin-vilant                DISCONTINUED MEDICATIONS:  Current Discharge Medication List          I am the Primary Clinician of Record. Alecea Trego Nils Pyle, MD (electronically signed)    (Please note that parts of this dictation were completed with voice recognition software. Quite often unanticipated grammatical, syntax, homophones, and other interpretive errors are inadvertently transcribed by the computer software. Please disregards these errors. Please excuse any errors that have escaped final proofreading.)

## 2022-01-27 ENCOUNTER — Inpatient Hospital Stay
Admission: EM | Admit: 2022-01-27 | Discharge: 2022-02-01 | Disposition: A | Discharge status: Home Health | Payer: MEDICARE | Admitting: Internal Medicine

## 2022-01-27 ENCOUNTER — Emergency Department: Admit: 2022-01-27 | Payer: MEDICARE | Primary: Internal Medicine

## 2022-01-27 LAB — CBC WITH AUTO DIFFERENTIAL
Absolute Immature Granulocyte: 0 10*3/uL
Basophils %: 0 % (ref 0–1)
Basophils Absolute: 0 10*3/uL (ref 0.0–0.1)
Eosinophils %: 1 % (ref 0–7)
Eosinophils Absolute: 0.1 10*3/uL (ref 0.0–0.4)
Hematocrit: 29.9 % — ABNORMAL LOW (ref 36.6–50.3)
Hemoglobin: 8.6 g/dL — ABNORMAL LOW (ref 12.1–17.0)
Immature Granulocytes: 0 %
Lymphocytes %: 24 % (ref 12–49)
Lymphocytes Absolute: 2.6 10*3/uL (ref 0.8–3.5)
MCH: 24.6 PG — ABNORMAL LOW (ref 26.0–34.0)
MCHC: 28.8 g/dL — ABNORMAL LOW (ref 30.0–36.5)
MCV: 85.7 FL (ref 80.0–99.0)
MPV: 8.5 FL — ABNORMAL LOW (ref 8.9–12.9)
Monocytes %: 7 % (ref 5–13)
Monocytes Absolute: 0.8 10*3/uL (ref 0.0–1.0)
Neutrophils %: 68 % (ref 32–75)
Neutrophils Absolute: 7.2 10*3/uL (ref 1.8–8.0)
Nucleated RBCs: 0 PER 100 WBC
Platelets: 210 10*3/uL (ref 150–400)
RBC: 3.49 M/uL — ABNORMAL LOW (ref 4.10–5.70)
RDW: 21.5 % — ABNORMAL HIGH (ref 11.5–14.5)
WBC: 10.7 10*3/uL (ref 4.1–11.1)
nRBC: 0 10*3/uL (ref 0.00–0.01)

## 2022-01-27 LAB — COMPREHENSIVE METABOLIC PANEL
ALT: <6 U/L — ABNORMAL LOW (ref 12–78)
AST: 17 U/L (ref 15–37)
Albumin/Globulin Ratio: 0.4 — ABNORMAL LOW (ref 1.1–2.2)
Albumin: 1.8 g/dL — ABNORMAL LOW (ref 3.5–5.0)
Alk Phosphatase: 89 U/L (ref 45–117)
Anion Gap: 4 mmol/L — ABNORMAL LOW (ref 5–15)
BUN: 10 mg/dL (ref 6–20)
Bun/Cre Ratio: 16 (ref 12–20)
CO2: 28 mmol/L (ref 21–32)
Calcium: 7.7 mg/dL — ABNORMAL LOW (ref 8.5–10.1)
Chloride: 104 mmol/L (ref 97–108)
Creatinine: 0.62 mg/dL — ABNORMAL LOW (ref 0.70–1.30)
Est, Glom Filt Rate: >60 mL/min/{1.73_m2} (ref >60)
Globulin: 4.7 g/dL — ABNORMAL HIGH (ref 2.0–4.0)
Glucose: 109 mg/dL — ABNORMAL HIGH (ref 65–100)
Potassium: 3.6 mmol/L (ref 3.5–5.1)
Sodium: 136 mmol/L (ref 136–145)
Total Bilirubin: 0.2 mg/dL (ref 0.2–1.0)
Total Protein: 6.5 g/dL (ref 6.4–8.2)

## 2022-01-27 LAB — POCT GLUCOSE
POC Glucose: 100 mg/dL (ref 65–100)
POC Glucose: 104 mg/dL — ABNORMAL HIGH (ref 65–100)

## 2022-01-27 LAB — URINALYSIS WITH MICROSCOPIC
Bilirubin Urine: NEGATIVE
Blood, Urine: NEGATIVE
Glucose, UA: NEGATIVE mg/dL
Ketones, Urine: 5 mg/dL — AB
Leukocyte Esterase, Urine: NEGATIVE
Nitrite, Urine: NEGATIVE
Protein, UA: 30 mg/dL — AB
Specific Gravity, UA: 1.024 (ref 1.003–1.030)
Urobilinogen, Urine: 2 EU/dL — ABNORMAL HIGH (ref 0.1–1.0)
pH, Urine: 5 (ref 5.0–8.0)

## 2022-01-27 LAB — C DIFF TOXIN/ANTIGEN
C Diff Toxin Interpretation: POSITIVE — AB
C difficile Toxin, EIA: POSITIVE — AB
GDH Antigen: POSITIVE

## 2022-01-27 LAB — *Unknown
Date: 1
Occult Blood, Stool #1: NEGATIVE

## 2022-01-27 MED ORDER — RANOLAZINE ER 500 MG PO TB12
500 MG | Freq: Two times a day (BID) | ORAL | Status: AC
Start: 2022-01-27 — End: 2022-02-01
  Administered 2022-01-28 – 2022-02-01 (×10): 500 mg via ORAL

## 2022-01-27 MED ORDER — GLUCOSE 4 G PO CHEW
4 g | ORAL | Status: AC | PRN
Start: 2022-01-27 — End: 2022-02-01

## 2022-01-27 MED ORDER — AMIODARONE HCL 200 MG PO TABS
200 MG | Freq: Every day | ORAL | Status: AC
Start: 2022-01-27 — End: 2022-02-01
  Administered 2022-01-27 – 2022-02-01 (×6): 200 mg via ORAL

## 2022-01-27 MED ORDER — VANCOMYCIN HCL 125 MG PO CAPS
125 | Freq: Four times a day (QID) | ORAL | Status: DC
Start: 2022-01-27 — End: 2022-02-01
  Administered 2022-01-28 – 2022-02-01 (×18): 125 mg via ORAL

## 2022-01-27 MED ORDER — CARBIDOPA-LEVODOPA ER 25-100 MG PO TBCR
25-100 MG | Freq: Three times a day (TID) | ORAL | Status: AC
Start: 2022-01-27 — End: 2022-02-01
  Administered 2022-01-27 – 2022-02-01 (×16): 4 via ORAL

## 2022-01-27 MED ORDER — EMPAGLIFLOZIN 10 MG PO TABS
10 MG | Freq: Every day | ORAL | Status: DC
Start: 2022-01-27 — End: 2022-01-27

## 2022-01-27 MED ORDER — DEXTROSE 10 % IV BOLUS
INTRAVENOUS | Status: AC | PRN
Start: 2022-01-27 — End: 2022-02-01

## 2022-01-27 MED ORDER — FLUTICASONE-UMECLIDIN-VILANT 100-62.5-25 MCG/ACT IN AEPB
Freq: Every day | RESPIRATORY_TRACT | Status: DC
Start: 2022-01-27 — End: 2022-01-27

## 2022-01-27 MED ORDER — INSULIN LISPRO 100 UNIT/ML IJ SOLN
100 UNIT/ML | Freq: Every evening | INTRAMUSCULAR | Status: AC
Start: 2022-01-27 — End: 2022-02-01

## 2022-01-27 MED ORDER — TIOTROPIUM BROMIDE MONOHYDRATE 2.5 MCG/ACT IN AERS
2.5 | Freq: Every day | RESPIRATORY_TRACT | Status: DC
Start: 2022-01-27 — End: 2022-02-01
  Administered 2022-01-28 – 2022-02-01 (×5): 2 via RESPIRATORY_TRACT

## 2022-01-27 MED ORDER — APIXABAN 5 MG PO TABS
5 MG | Freq: Two times a day (BID) | ORAL | Status: AC
Start: 2022-01-27 — End: 2022-02-01
  Administered 2022-01-28 – 2022-02-01 (×10): 5 mg via ORAL

## 2022-01-27 MED ORDER — ALBUTEROL SULFATE HFA 108 (90 BASE) MCG/ACT IN AERS
108 (90 Base) MCG/ACT | Freq: Four times a day (QID) | RESPIRATORY_TRACT | Status: AC | PRN
Start: 2022-01-27 — End: 2022-02-01

## 2022-01-27 MED ORDER — CHOLESTYRAMINE 4 G PO PACK
4 g | PACK | Freq: Three times a day (TID) | ORAL | 0 refills | Status: AC
Start: 2022-01-27 — End: 2022-02-03

## 2022-01-27 MED ORDER — BUDESONIDE-FORMOTEROL FUMARATE 160-4.5 MCG/ACT IN AERO
Freq: Two times a day (BID) | RESPIRATORY_TRACT | Status: AC
Start: 2022-01-27 — End: 2022-02-01
  Administered 2022-01-28 – 2022-02-01 (×10): 2 via RESPIRATORY_TRACT

## 2022-01-27 MED ORDER — ROSUVASTATIN CALCIUM 20 MG PO TABS
20 MG | Freq: Every evening | ORAL | Status: AC
Start: 2022-01-27 — End: 2022-02-01
  Administered 2022-01-28 – 2022-02-01 (×5): 20 mg via ORAL

## 2022-01-27 MED ORDER — INSULIN LISPRO 100 UNIT/ML IJ SOLN
100 UNIT/ML | Freq: Three times a day (TID) | INTRAMUSCULAR | Status: AC
Start: 2022-01-27 — End: 2022-02-01

## 2022-01-27 MED ORDER — GLUCAGON (RDNA) 1 MG IJ KIT
1 MG | INTRAMUSCULAR | Status: AC | PRN
Start: 2022-01-27 — End: 2022-02-01

## 2022-01-27 MED ORDER — PANTOPRAZOLE SODIUM 40 MG PO TBEC
40 MG | Freq: Every day | ORAL | Status: AC
Start: 2022-01-27 — End: 2022-02-01
  Administered 2022-01-28 – 2022-02-01 (×5): 40 mg via ORAL

## 2022-01-27 MED ORDER — METRONIDAZOLE 500 MG PO TABS
500 MG | ORAL_TABLET | Freq: Three times a day (TID) | ORAL | 0 refills | Status: AC
Start: 2022-01-27 — End: 2022-02-03

## 2022-01-27 MED ORDER — DEXTROSE 10 % IV SOLN
10 % | INTRAVENOUS | Status: AC | PRN
Start: 2022-01-27 — End: 2022-02-01

## 2022-01-27 MED FILL — AMIODARONE HCL 200 MG PO TABS: 200 MG | ORAL | Qty: 1

## 2022-01-27 MED FILL — CARBIDOPA-LEVODOPA ER 25-100 MG PO TBCR: 25-100 MG | ORAL | Qty: 4

## 2022-01-27 MED FILL — DEXTROSE 10 % IV SOLN: 10 % | INTRAVENOUS | Qty: 1000

## 2022-01-27 NOTE — H&P (Signed)
Hospitalist Admission Note    NAME:   Carlos Petersen   DOB: 1951-01-07   MRN: [id number]     Date/Time: 01/27/2022 10:18 AM    Patient PCP: Gaetano Net, MD    ______________________________________________________________________  Given the patient's current clinical presentation, I have a high level of concern for decompensation if discharged from the emergency department. Complex decision making was performed, which includes reviewing the patient's available past medical records, laboratory results, and x-ray films.         Assessment / Plan:    C difficile colitis -suspected recent antibiotic use.  He does have chronic diarrhea but certainly worse recently.  He does not appear septic and no abdominal tenderness tolerating p.o. well currently with patient in isolation and treat with a 10-day course of vancomycin.  Try to limit any antibiotics.  Monitor clinical exam for poor response to vancomycin.    Metabolic encephalopathy -transient episode that sounds more like encephalopathy rather than acute stroke.  He did have a head CT that showed no acute process.  MRI due to pacemaker and likely consultation for stroke is low.  We will monitor his clinical exam    Parkinson's disease -diagnosed many years ago and just on a low-dose Sinemet.  Has not had any recent follow-up with neurology in the last few years.  I wonder if some of his failure to thrive is due to Parkinson's disease.  Therapy evaluated him watching for evidence of freezing with mobility.  Continue Sinemet.    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  Given decreased intake and diarrhea we will hold oral agents and just cover with sliding scale for now    HLD in DM -continue statin    Hx PE -given the left hip x-ray increased risk for recurrent DVT will continue with Eliquis.  He does have anemia at least since his surgery but is actually improved and no evidence of overt bleeding.  Check iron studies    COPD -ongoing tobacco  use    Asthma exam.  No indication for systemic steroids.  Continue Trelegy    Recent left hop fracture s/p ORIF -doing well incision is well approximated without any overt signs of infection.  He is weightbearing as tolerated.    Severe protein calorie malnutrition -albumin 1.8.  Now exacerbated by C. difficile infection.  He is tolerating p.o.  Nutrition consult add supplements especially to improve protein stores.    Anemia -exacerbated by recent surgery and a component of nutritional deficiencies.  Check iron studies    Generalized weakness -PT OT evaluation limited in the course of subacute rehab at SNF    Goals care discussion undertaken with patient-he reports that he is a DO NOT RESUSCITATE    Medical Decision Making:   I personally reviewed labs: yes  I personally reviewed imaging:yes  Discussed case with: ED provider. After discussion I am in agreement that acuity of patient's medical condition necessitates hospital stay.  Also discussed with patient and son    Code Status: DNR  DVT Prophylaxis: eliquis      Subjective:   CHIEF COMPLAINT: weakness and diarrhea    HISTORY OF PRESENT ILLNESS:     Carlos Petersen is a 71 y.o.  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  Proximately 1 month ago he underwent ORIF of left hip fracture.  He has  done well in terms of recovery except for generalized weakness that is actually gotten worse in the last 1 to 2 weeks.  There is no focal weakness other than he describes some right arm weakness that is chronic and not acute.  He has has chronic loose stools but does admit that his diarrhea has been worse recently.  P.o. intake has been diminished for several weeks but no abdominal pain or blood in stools.  No fevers or chills.  Of note he was recently treated for urinary tract infection with a cephalosporin.  Currently no  dysuria.  Regarding his episode of confusion that occurred yesterday he says he just got a large period time.  No loss of consciousness.  No associated headache visual changes or focal weakness or paresthesias at that time.  In talking with his son and patient it appears that his mental status is now improved and that is currently at baseline.  Due to these complaints his orthopedic suggested he present to the emergency room.  He was found to be afebrile and hemodynamically stable.  Head CT without acute process.  Labs showed normal white count and essentially normal electrolytes.  He does have chronic anemia with hemoglobin of 8.6.  Noted albumin is 1.8.  C. difficile testing was positive.  Patient placed in observation for further work-up and management    Past Medical History:   Diagnosis Date    Cerebral artery occlusion with cerebral infarction ([place])     COPD (chronic obstructive pulmonary disease) ([facility])     Depression     Diabetes ([place])     Emphysema lung ([place])     Heart disease     Hypercholesterolemia     Parkinson disease ([place])     Pulmonary embolism ([facility])         Past Surgical History:   Procedure Laterality Date    CARDIAC DEFIBRILLATOR PLACEMENT      COLONOSCOPY      PACEMAKER      PACEMAKER PLACEMENT      TOTAL HIP ARTHROPLASTY Right 01/2016       Social History     Tobacco Use    Smoking status: Every Day     Packs/day: 0.50     Types: Cigarettes    Smokeless tobacco: Never   Substance Use Topics    Alcohol use: Never    Lives with son  Poor ambulation recentlyweakness    FH: positive for HTN, DM    No Known Allergies     Prior to Admission medications    Medication Sig Start Date End Date Taking? Authorizing Provider   cholestyramine (QUESTRAN) 4 g packet Take 1 packet by mouth 3 times daily (with meals) for 7 days 01/27/22 02/03/22 Yes Ahmed I Moreen Fowler, MD   metroNIDAZOLE (FLAGYL) 500 MG tablet Take 1 tablet by mouth 3 times daily for 7 days 01/27/22 02/03/22 Yes Ahmed I Moreen Fowler, MD    fluticasone-umeclidin-vilant (TRELEGY ELLIPTA) 100-62.5-25 MCG/ACT AEPB inhaler INHALE 1 PUFF BY MOUTH DAILY. 11/17/21   Ar Automatic Reconciliation   ondansetron (ZOFRAN) 4 MG tablet Take 1 tablet by mouth 3 times daily as needed for Nausea or Vomiting 01/15/22   Beatris Ship, MD   alendronate (FOSAMAX) 70 MG tablet Take 1 tablet by mouth    Historical Provider, MD   albuterol sulfate HFA (PROVENTIL;VENTOLIN;PROAIR) 108 (90 Base) MCG/ACT inhaler Inhale 2 puffs into the lungs every 6 hours as needed    Historical Provider, MD  amiodarone (CORDARONE) 200 MG tablet Take 1 tablet by mouth daily 11/17/21   Historical Provider, MD   apixaban (ELIQUIS) 5 MG TABS tablet Take 1 tablet by mouth 2 times daily 11/17/21   Historical Provider, MD   carbidopa-levodopa (SINEMET CR) 50-200 MG per extended release tablet Take 2 tablets by mouth 3 times daily 11/17/21 11/12/22  Historical Provider, MD   carvedilol (COREG) 6.25 MG tablet Take 1 tablet by mouth 2 times daily 11/17/21   Historical Provider, MD   celecoxib (CELEBREX) 200 MG capsule  12/23/21   Historical Provider, MD   JARDIANCE 10 MG tablet  11/18/21   Historical Provider, MD   gabapentin (NEURONTIN) 100 MG capsule Take 1 capsule by mouth 3 times daily. 12/23/21   Historical Provider, MD   omeprazole (PRILOSEC) 40 MG delayed release capsule Take 1 capsule by mouth daily    Historical Provider, MD   ranolazine (RANEXA) 500 MG extended release tablet Take 1 tablet by mouth 2 times daily 11/27/21   Historical Provider, MD   rosuvastatin (CRESTOR) 20 MG tablet Take 1 tablet by mouth 11/27/21 11/22/22  Historical Provider, MD         Objective:   VITALS:    Patient Vitals for the past 24 hrs:   BP Temp Temp src Pulse Resp SpO2 Height Weight   01/27/22 0745 (!) 108/49 -- -- 85 -- 97 % -- --   01/27/22 0730 (!) 104/53 -- -- 84 -- 96 % -- --   01/27/22 0715 (!) 100/47 -- -- 83 -- 97 % -- --   01/27/22 0700 (!) 104/47 -- -- 85 -- 97 % -- --   01/27/22 0600 (!) 106/45 -- -- 90 --  98 % -- --   01/27/22 0530 (!) 104/50 -- -- 89 -- 99 % -- --   01/27/22 0500 (!) 108/52 -- -- 91 -- 96 % -- --   01/27/22 0430 (!) 104/51 -- -- 95 -- 98 % -- --   01/27/22 0400 (!) 109/51 -- -- 92 -- 97 % -- --   01/27/22 0330 (!) 111/52 -- -- 94 -- 98 % -- --   01/27/22 0315 (!) 113/50 -- -- 94 -- 98 % -- --   01/27/22 0300 (!) 134/52 -- -- 96 -- 99 % -- --   01/27/22 0245 (!) 117/49 -- -- 89 -- 97 % -- --   01/27/22 0230 (!) 124/53 -- -- 87 -- 98 % -- --   01/27/22 0130 (!) 120/52 -- -- 84 16 98 % -- --   01/27/22 0030 (!) 118/57 -- -- 89 17 98 % -- --   01/26/22 2301 (!) 121/52 98.5 F (36.9 C) Oral 91 16 99 % 1.803 m ('5\' 11"'$ ) 95.3 kg (210 lb)       Temp (24hrs), Avg:98.5 F (36.9 C), Min:98.5 F (36.9 C), Max:98.5 F (36.9 C)             Wt Readings from Last 12 Encounters:   01/26/22 95.3 kg (210 lb)   01/22/22 90.7 kg (200 lb)   01/15/22 90.7 kg (200 lb)   11/19/21 104.3 kg (230 lb)   05/18/21 122.9 kg (271 lb)   01/22/21 107.2 kg (236 lb 6.4 oz)   05/06/20 108.8 kg (239 lb 12.8 oz)         PHYSICAL EXAM:  General:    Alert, cooperative, chronically ill-appearing, no acute distress  HEENT:  PERR, EOMI, sclera clear, MMM, has dentures  Resp:    CTA b/l.  No wheezing or Rhonchi. No rales.  Chest wall:  No tenderness.  No accessory muscle use. Has PPM  CV:   Regular  rhythm,  No  Murmur.   No edema  Abdomen:   Soft, NT. ND  BS+  Extremities: Warm, no edema  Skin:    turgor diminished, no rash  Neurologic: No facial asymmetry. Cn grossly intact, No aphasia or slurred speech. Symmetrical strength, Sensation grossly intact. AO times 3 and answers questions, does not appear encephalopathic, good insight, language fluent.         LAB DATA REVIEWED:    Recent Results (from the past 12 hour(s))   CBC with Auto Differential    Collection Time: 01/26/22 11:03 PM   Result Value Ref Range    WBC 10.7 4.1 - 11.1 K/uL    RBC 3.49 (L) 4.10 - 5.70 M/uL    Hemoglobin 8.6 (L) 12.1 - 17.0 g/dL    Hematocrit 29.9 (L) 36.6 -  50.3 %    MCV 85.7 80.0 - 99.0 FL    MCH 24.6 (L) 26.0 - 34.0 PG    MCHC 28.8 (L) 30.0 - 36.5 g/dL    RDW 21.5 (H) 11.5 - 14.5 %    Platelets 210 150 - 400 K/uL    MPV 8.5 (L) 8.9 - 12.9 FL    Nucleated RBCs 0.0 0.0 PER 100 WBC    nRBC 0.00 0.00 - 0.01 K/uL    Neutrophils % 68 32 - 75 %    Lymphocytes % 24 12 - 49 %    Monocytes % 7 5 - 13 %    Eosinophils % 1 0 - 7 %    Basophils % 0 0 - 1 %    Immature Granulocytes 0 %    Neutrophils Absolute 7.2 1.8 - 8.0 K/UL    Lymphocytes Absolute 2.6 0.8 - 3.5 K/UL    Monocytes Absolute 0.8 0.0 - 1.0 K/UL    Eosinophils Absolute 0.1 0.0 - 0.4 K/UL    Basophils Absolute 0.0 0.0 - 0.1 K/UL    Absolute Immature Granulocyte 0.0 K/UL    Differential Type Smear Scanned      RBC Comment Anisocytosis  1+        RBC Comment Hypochromia  1+       Comprehensive Metabolic Panel    Collection Time: 01/26/22 11:03 PM   Result Value Ref Range    Sodium 136 136 - 145 mmol/L    Potassium 3.6 3.5 - 5.1 mmol/L    Chloride 104 97 - 108 mmol/L    CO2 28 21 - 32 mmol/L    Anion Gap 4 (L) 5 - 15 mmol/L    Glucose 109 (H) 65 - 100 mg/dL    BUN 10 6 - 20 mg/dL    Creatinine 0.62 (L) 0.70 - 1.30 mg/dL    Bun/Cre Ratio 16 12 - 20      Est, Glom Filt Rate >60 >60 ml/min/1.32m    Calcium 7.7 (L) 8.5 - 10.1 mg/dL    Total Bilirubin 0.2 0.2 - 1.0 mg/dL    AST 17 15 - 37 U/L    ALT <6 (L) 12 - 78 U/L    Alk Phosphatase 89 45 - 117 U/L    Total Protein 6.5 6.4 - 8.2 g/dL    Albumin 1.8 (L) 3.5 - 5.0 g/dL    Globulin 4.7 (H) 2.0 - 4.0 g/dL  Albumin/Globulin Ratio 0.4 (L) 1.1 - 2.2     Occult Blood, Fecal    Collection Time: 01/27/22  1:26 AM   Result Value Ref Range    Occult Blood, Stool #1 Negative Negative      Date 1     Urinalysis with Microscopic    Collection Time: 01/27/22  8:40 AM   Result Value Ref Range    Color, UA Dark Yellow      Appearance Clear Clear      Specific Gravity, UA 1.024 1.003 - 1.030      pH, Urine 5.0 5.0 - 8.0      Protein, UA 30 (A) Negative mg/dL    Glucose, UA Negative  Negative mg/dL    Ketones, Urine 5 (A) Negative mg/dL    Bilirubin Urine Negative Negative      Blood, Urine Negative Negative      Urobilinogen, Urine 2.0 (H) 0.1 - 1.0 EU/dL    Nitrite, Urine Negative Negative      Leukocyte Esterase, Urine Negative Negative      WBC, UA 0-4 0 - 4 /hpf    RBC, UA 0-5 0 - 5 /hpf    Epithelial Cells UA Few Few /lpf    BACTERIA, URINE 1+ (A) Negative /hpf    Mucus, UA Trace (A) Negative /lpf         CT HEAD WO CONTRAST    Result Date: 01/27/2022  INDICATION:   weakness EXAMINATION:  CT HEAD WO CONTRAST COMPARISON:  None TECHNIQUE:  Routine noncontrast axial head CT was performed.  Sagittal and coronal reconstructions were generated. CT dose reduction was achieved through use of a standardized protocol tailored for this examination and automatic exposure control for dose modulation.  FINDINGS: Ventricles:  Midline, no hydrocephalus.  Intracranial Hemorrhage:  None.  Brain Parenchyma/Brainstem:  Normal for age. Basal Cisterns:  Normal. Paranasal Sinuses:  Visualized sinuses are clear. Soft Tissues:  No significant soft tissue swelling. Osseous Structures:  No acute fracture. Additional Comments:  N/A.     No acute traumatic injury.     CT CERVICAL SPINE WO CONTRAST    Result Date: 01/27/2022  INDICATION: weakness Exam: Noncontrast CT of the cervical spine is performed with 2.5 mm collimation. Sagittal and coronal reformatted images were also performed. CT dose reduction was achieved through use of a standardized protocol tailored for this examination and automatic exposure control for dose modulation. FINDINGS: There is no acute fracture or subluxation. The prevertebral soft tissues are within normal limits. Bones are diffusely demineralized. Remaining visualized soft tissues are normal. Multilevel cervical degenerative changes are noted. There is an incompletely visualized right pleural effusion.     1. No acute fracture or subluxation. 2. Incompletely visualized right pleural effusion.       XR CHEST PORTABLE    Result Date: 01/15/2022  INDICATION: Weakness. Portable AP view of the chest. Direct comparison made to prior chest x-ray dated April 2023. Cardiomediastinal silhouette is stable. Pacer leads are unchanged in position. There is very mild right basilar atelectasis. Lungs are otherwise clear. There is no pleural fluid. Chronic bilateral rib fracture deformities are noted. There is no pneumothorax. The osseous structures are diffusely demineralized. There is an ossific density inferior to the proximal left humerus and there is a probable adjacent donor site, is compared to prior CT dated March 2023.     Very mild right basilar atelectasis.      XR HIP 2-3 VW W PELVIS LEFT  Result Date: 01/15/2022  EXAM: XR HIP 2-3 VW W PELVIS LEFT INDICATION: S/p hip replacement, pt sts cannot bear weight. COMPARISON: April 21. FINDINGS: AP view of the pelvis and a frogleg lateral view of the left hip demonstrate no fracture, dislocation or other acute abnormality. Bilateral total hip arthroplasties are intact. The bones are osteopenic. There are superficial skin staples lateral to the left hip.     No acute abnormality.     _______________________________________________________________________    TOTAL TIME:  76 Minutes      Signed: Marius Ditch, MD    Procedures: see electronic medical records for all procedures/Xrays and details which were not copied into this note but were reviewed prior to creation of Plan.

## 2022-01-27 NOTE — Progress Notes (Signed)
Symbicort + Spiriva inhalers therapeutically interchanged for Trelegy per [facility] policy    Kindle Strohmeier P. Milfred Krammes MS R.Ph

## 2022-01-27 NOTE — Care Coordination-Inpatient (Signed)
Patient readmitted after 30 day toc. Episode resolved. Patient currently in hospital. Patient attended ortho follow up. Patient had ED visits x 2 for UTI and stroke work up.  Festus Aloe RN, CPN - Care Transition Nurse- [phone]

## 2022-01-27 NOTE — ED Notes (Signed)
Pt son at bedside. He reported to dr al Joycelyn Schmid he thinks the pt may have had a stroke 2 days ago.      Gilford Rile, RN  01/27/22 [phone]

## 2022-01-27 NOTE — ED Notes (Signed)
[facility] transportation set up. Pick up time some time after 0700.      Marlyce Huge, RN  01/27/22 [phone]

## 2022-01-27 NOTE — Progress Notes (Signed)
Per [employer] approved formulary policy.     SGLT2's are only formulary with the indication of CKD or CHF therefore:    Please note that the  Empagliflozin [place]) is non-formulary with indications of type 2 diabetes and has been discontinued while inpatient. If you feel the patient needs to continue their home therapy during the inpatient stay, the patient may bring their medication bottle for verification and administration pursuant to our home medication use policy.      Please contact the pharmacy with any questions or concerns.  Thank you.  Leily Capek PHILLIP, RPH, MS   01/27/2022 4:03 PM

## 2022-01-27 NOTE — Plan of Care (Signed)
Problem: Discharge Planning  Goal: Discharge to home or other facility with appropriate resources  Outcome: Progressing  Flowsheets (Taken 01/27/2022 1226)  Discharge to home or other facility with appropriate resources: Identify barriers to discharge with patient and caregiver     Problem: Skin/Tissue Integrity  Goal: Absence of new skin breakdown  Description: 1.  Monitor for areas of redness and/or skin breakdown  2.  Assess vascular access sites hourly  3.  Every 4-6 hours minimum:  Change oxygen saturation probe site  4.  Every 4-6 hours:  If on nasal continuous positive airway pressure, respiratory therapy assess nares and determine need for appliance change or resting period.  Outcome: Progressing     Problem: Safety - Adult  Goal: Free from fall injury  Outcome: Progressing     Problem: ABCDS Injury Assessment  Goal: Absence of physical injury  Outcome: Progressing

## 2022-01-27 NOTE — ED Notes (Signed)
[facility] consult called. Jenner placed at bedside     Carlos Petersen, [place]  01/27/22 [phone]

## 2022-01-27 NOTE — Care Coordination-Inpatient (Signed)
01/27/22 1100   Service Assessment   Patient Orientation Alert and Oriented   Cognition Alert   History Provided By Patient   Primary Caregiver Family   Accompanied By/Relationship Pt alone on interview.   Support Systems Children  (Son Aylen.)   Patient's Healthcare Decision Maker is: Scientist, research (physical sciences) Next of Kin   PCP Verified by CM Yes  ([place])   Last Visit to PCP Within last 3 months   Prior Functional Level Assistance with the following:;Bathing;Dressing;Toileting;Cooking;Housework;Shopping;Mobility  (Uses w/c/power chair.)   Current Functional Level Assistance with the following:;Bathing;Dressing;Toileting;Cooking;Housework;Shopping;Mobility   Can patient return to prior living arrangement Yes   Ability to make needs known: Good   Family able to assist with home care needs: Yes   Would you like for me to discuss the discharge plan with any other family members/significant others, and if so, who? Yes  (Son  Air ).)   Firefighter None   Social/Functional History   Lives With Son   Type of [place] One level   Bathroom Shower/Tub Tub/Shower unit   [place]  (Pt states he does not use.)   Receives Help From Family   ADL Assistance Needs assistance   Toileting Needs assistance   [place] Needs assistance   Homemaking Responsibilities No   Ambulation Assistance Needs assistance  (Uses w/c/power chair.)   Transfer Assistance Needs assistance   Active Driver No   Occupation Retired   Dentist   Type of [place] Prior To Admission None   Potential Assistance Needed N/A   DME Ordered? No   Type of Home Care Services None  (Pt declined [facility].)   Patient expects to be discharged to: House   One/Two Story Residence One Chief Operating Officer At/After Discharge   Transition of Care Consult (CM Consult) Discharge [place]  Discharge None   [employer] Information Provided? No   Mode of Transport at Discharge Other (see comment)  (Son to transport home.)   Confirm Follow Up Transport Family     CM met with pt & D/C Plan is home with son Xavyer Steenson) & son to transport. Send Rxs to [facility] upon discharge.

## 2022-01-28 LAB — POCT GLUCOSE
POC Glucose: 104 mg/dL — ABNORMAL HIGH (ref 65–100)
POC Glucose: 105 mg/dL — ABNORMAL HIGH (ref 65–100)
POC Glucose: 108 mg/dL — ABNORMAL HIGH (ref 65–100)
POC Glucose: 113 mg/dL — ABNORMAL HIGH (ref 65–100)
POC Glucose: 125 mg/dL — ABNORMAL HIGH (ref 65–100)
POC Glucose: 98 mg/dL (ref 65–100)

## 2022-01-28 LAB — BASIC METABOLIC PANEL W/ REFLEX TO MG FOR LOW K
Anion Gap: 2 mmol/L — ABNORMAL LOW (ref 5–15)
BUN: 9 mg/dL (ref 6–20)
Bun/Cre Ratio: 18 (ref 12–20)
CO2: 29 mmol/L (ref 21–32)
Calcium: 7.6 mg/dL — ABNORMAL LOW (ref 8.5–10.1)
Chloride: 107 mmol/L (ref 97–108)
Creatinine: 0.5 mg/dL — ABNORMAL LOW (ref 0.70–1.30)
Est, Glom Filt Rate: >60 mL/min/{1.73_m2} (ref >60)
Glucose: 90 mg/dL (ref 65–100)
Potassium: 3.4 mmol/L — ABNORMAL LOW (ref 3.5–5.1)
Sodium: 138 mmol/L (ref 136–145)

## 2022-01-28 LAB — CBC WITH AUTO DIFFERENTIAL
Absolute Immature Granulocyte: 0 10*3/uL (ref 0.00–0.04)
Basophils %: 1 % (ref 0–1)
Basophils Absolute: 0 10*3/uL (ref 0.0–0.1)
Eosinophils %: 2 % (ref 0–7)
Eosinophils Absolute: 0.1 10*3/uL (ref 0.0–0.4)
Hematocrit: 26.5 % — ABNORMAL LOW (ref 36.6–50.3)
Hemoglobin: 7.8 g/dL — ABNORMAL LOW (ref 12.1–17.0)
Immature Granulocytes: 1 % — ABNORMAL HIGH (ref 0–0.5)
Lymphocytes %: 24 % (ref 12–49)
Lymphocytes Absolute: 1.5 10*3/uL (ref 0.8–3.5)
MCH: 24.6 PG — ABNORMAL LOW (ref 26.0–34.0)
MCHC: 29.4 g/dL — ABNORMAL LOW (ref 30.0–36.5)
MCV: 83.6 FL (ref 80.0–99.0)
MPV: 8.5 FL — ABNORMAL LOW (ref 8.9–12.9)
Monocytes %: 10 % (ref 5–13)
Monocytes Absolute: 0.6 10*3/uL (ref 0.0–1.0)
Neutrophils %: 62 % (ref 32–75)
Neutrophils Absolute: 3.8 10*3/uL (ref 1.8–8.0)
Nucleated RBCs: 0 PER 100 WBC
Platelets: 203 10*3/uL (ref 150–400)
RBC: 3.17 M/uL — ABNORMAL LOW (ref 4.10–5.70)
RDW: 21 % — ABNORMAL HIGH (ref 11.5–14.5)
WBC: 6.1 10*3/uL (ref 4.1–11.1)
nRBC: 0 10*3/uL (ref 0.00–0.01)

## 2022-01-28 LAB — BASIC METABOLIC PANEL
Anion Gap: 4 mmol/L — ABNORMAL LOW (ref 5–15)
BUN: 8 mg/dL (ref 6–20)
Bun/Cre Ratio: 17 (ref 12–20)
CO2: 27 mmol/L (ref 21–32)
Calcium: 7.4 mg/dL — ABNORMAL LOW (ref 8.5–10.1)
Chloride: 106 mmol/L (ref 97–108)
Creatinine: 0.48 mg/dL — ABNORMAL LOW (ref 0.70–1.30)
Est, Glom Filt Rate: >60 mL/min/{1.73_m2} (ref >60)
Glucose: 103 mg/dL — ABNORMAL HIGH (ref 65–100)
Potassium: 3.5 mmol/L (ref 3.5–5.1)
Sodium: 137 mmol/L (ref 136–145)

## 2022-01-28 LAB — HEMOGLOBIN A1C: Hemoglobin A1C: <3.8 % — ABNORMAL LOW (ref 4.0–5.6)

## 2022-01-28 MED ORDER — ACIDOPHILUS/CITRUS PECTIN PO TABS
Freq: Two times a day (BID) | ORAL | Status: DC
Start: 2022-01-28 — End: 2022-02-01
  Administered 2022-01-29 – 2022-02-01 (×8): 1 via ORAL

## 2022-01-28 MED FILL — VANCOMYCIN HCL 125 MG PO CAPS: 125 MG | ORAL | Qty: 1

## 2022-01-28 MED FILL — RANOLAZINE ER 500 MG PO TB12: 500 MG | ORAL | Qty: 1

## 2022-01-28 MED FILL — SYMBICORT 160-4.5 MCG/ACT IN AERO: 160-4.5 MCG/ACT | RESPIRATORY_TRACT | Qty: 6

## 2022-01-28 MED FILL — ELIQUIS 5 MG PO TABS: 5 MG | ORAL | Qty: 1

## 2022-01-28 MED FILL — ROSUVASTATIN CALCIUM 20 MG PO TABS: 20 MG | ORAL | Qty: 1

## 2022-01-28 MED FILL — CARBIDOPA-LEVODOPA ER 25-100 MG PO TBCR: 25-100 MG | ORAL | Qty: 4

## 2022-01-28 MED FILL — SPIRIVA RESPIMAT 2.5 MCG/ACT IN AERS: 2.5 MCG/ACT | RESPIRATORY_TRACT | Qty: 4

## 2022-01-28 NOTE — Plan of Care (Signed)
OCCUPATIONAL THERAPY EVALUATION  Patient: Carlos Petersen (71 y.o. male)  Date: 01/28/2022  Primary Diagnosis: Confusion [R41.0]  Diarrhea, unspecified type [R19.7]  AMS (altered mental status) [R41.82]       Precautions: Weight Bearing, General Precautions, Bed Alarm Right Lower Extremity Weight Bearing: Weight Bearing As Tolerated              In place during session:Peripheral IV and External Catheter    ASSESSMENT  Pt is a 71 y.o. male presenting to [facility] with c/o diarrhea for the last 2 wks, admitted 5/30 and currently being treated for C-diff, metabolic encephalopathy, and PD. Pt recently w/ L hemiarthroplasty; pt is WBAT to LLE. Pt received semi-supine in bed upon arrival, AXO x4, and agreeable to OT/PT evaluation.     Based on current observations, pt presents with decreased  functional mobility, independence in ADLs, ROM, strength, sensation, activity tolerance, endurance, balance, increased pain levels (see below for objective details and assist levels).     Overall, pt tolerates session fair w/out c/o pain, dizziness, or SOB. Pt req'd mod A for bed mobility and mod A x2 for STS using RW. Pt tolerate standing for brief period of time and attempted to take steps but unable to bear weight on LLE. He req'd total A for donning B socks and req'd set-up for oral hygiene. Pt will benefit from continued skilled OT services to address current impairments and improve IND and safety with self cares and functional transfers/mobility. Current OT d/c recommendation SNF once medically appropriate to return to PLOF and maximize safety/IND w/ functional mobility/ADLs.      Other factors to consider for discharge: family/social support, DME, time since onset, severity of deficits, functional baseline     Patient will benefit from skilled therapy intervention to address the above noted impairments.       PLAN :  Recommendations and Planned Interventions: self care training, therapeutic activities, functional mobility training,  balance training, therapeutic exercise, endurance activities, and patient education    Recommend with staff: Frequent repositioning to prevent skin breakdown    Recommend next session: LB dressing, LB bathing, and standing grooming    Frequency/Duration: Patient will be followed by occupational therapy:  3-5x/week to address goals.    Recommendation for discharge: (in order for the patient to meet his/her long term goals)  [place]    IF patient discharges home will need the following DME: TBD       SUBJECTIVE:   Patient stated "I wasn't able to stand when I got home from the hospital."    OBJECTIVE DATA SUMMARY:     Past Medical History:   Diagnosis Date    Cerebral artery occlusion with cerebral infarction ([place])     COPD (chronic obstructive pulmonary disease) ([place])     Depression     Diabetes ([place])     Emphysema lung ([place])     Heart disease     Hypercholesterolemia     Parkinson disease ([place])     Pulmonary embolism ([place])      Past Surgical History:   Procedure Laterality Date    CARDIAC DEFIBRILLATOR PLACEMENT      COLONOSCOPY      PACEMAKER      PACEMAKER PLACEMENT      TOTAL HIP ARTHROPLASTY Right 01/2016          Expanded or extensive additional review of patient history:   Lives With: Son  Type of Home: House  Home Layout: One level  Home Access: Level entry (2 steps to get to the bathroom)        Bathroom Shower/Tub: Research scientist (physical sciences): 3-in-1 commode  Bathroom Accessibility: Accessible (Pt states he does not use.)  Home Equipment: Angela Burke, rolling, Wheelchair-electric         EXAMINATION OF PERFORMANCE DEFICITS:    Cognitive/Behavioral Status:  Orientation  Orientation Level: Oriented X4  Cognition  Overall Cognitive Status: WNL  Hearing:   Hearing  Hearing: Within functional limits    Vision/Perceptual:          Vision  Vision: Within Functional Limits       Range of Motion:   AROM: Generally decreased, functional       Strength:  Strength:  Generally decreased, functional    Coordination:               Tone & Sensation:    B numbness in fingers         Functional Mobility and Transfers for ADLs:  Bed Mobility:  Bed Mobility Training  Bed Mobility Training: Yes  Rolling: Moderate assistance  Supine to Sit: Moderate assistance  Sit to Supine: Minimum assistance;Moderate assistance  Scooting: Moderate assistance    Transfers:  Pharmacologist: Yes  Sit to Stand: Moderate assistance;Assist X2  Stand to Sit: Moderate assistance;Assist X1      Balance:  Balance  Sitting: Intact  Standing: Impaired  Standing - Static: Fair;Constant support  Standing - Dynamic: Poor;Constant support      ADL Assessment:                                                  LE Dressing: Dependent/Total  LE Dressing Skilled Clinical Factors: at EOB                       Functional Measure:    KB Home	[place] AM-PACTM "6 Clicks"                                                       Daily Activity Inpatient Short Form  How much help from another person does the patient currently need... Total; A Lot A Little None   1.  Putting on and taking off regular lower body clothing? '[x]'   1 '[]'   2 '[]'   3 '[]'   4   2.  Bathing (including washing, rinsing, drying)? '[]'   1 '[x]'   2 '[]'   3 '[]'   4   3.  Toileting, which includes using toilet, bedpan or urinal? '[]'  1 '[x]'   2 '[]'   3 '[]'   4   4.  Putting on and taking off regular upper body clothing? '[]'   1 '[]'   2 '[x]'   3 '[]'   4   5.  Taking care of personal grooming such as brushing teeth? '[]'   1 '[]'   2 '[x]'   3 '[]'   4   6.  Eating meals? '[]'   1 '[]'   2 '[x]'   3 '[]'   4    2007, Trustees of [place], under license to [place]. All rights reserved     Score: 14/24  Interpretation of Tool:  Represents clinically-significant functional categories (i.e. Activities of daily living).  Percentage of Impairment CH    0%   CI    1-19% CJ    20-39% CK    40-59% CL    60-79% CM    80-99% CN     100%   AMPAC  Score 6-'24 24 23 ' 20-22 15-19 10-14 7-9 6      Occupational Therapy Evaluation Charge Determination   History Examination Decision-Making   LOW Complexity : Brief history review  LOW Complexity: 1-3 Performance deficits relating to physical, cognitive, or psychosocial skills that result in activity limitations and/or participation restrictions MEDIUM Complexity: Patient may present with comorbidities that affect occupational performance. Minimal to moderate modifications of tasks or assist (eg. physical or verbal) with assist is necessary to enable pt to complete eval      Based on the above components, the patient evaluation is determined to be of the following complexity level: Low      Pain Rating:  0/10   Activity Tolerance:   Fair  and requires rest breaks    After treatment patient left in no apparent distress:    Patient left in no apparent distress in bed, Call bell within reach, Bed/ chair alarm activated, and Side rails x3, bed locked and in lowest position    COMMUNICATION/EDUCATION:   The patient's plan of care was discussed with: Physical therapist and Registered nurse      Patient Education  Education Given To: Patient;Family  Education Provided: Role of Therapy;Plan of Care  Education Method: Demonstration;Verbal  Education Outcome: Verbalized understanding;Demonstrated understanding    The supervising occupational therapist and treating occupational therapist assistant have met to review this patient's progress and plan of care.    This patient's plan of care is appropriate for delegation to OTA.     PT/OT sessions occurred together for increased safety of pt and clinician.     Thank you for this referral,  Dewain Penning, OT  Minutes: 28     Problem: Occupational Therapy - Adult  Goal: By Discharge: Performs self-care activities at highest level of function for planned discharge setting.  See evaluation for individualized goals.  Description: FUNCTIONAL STATUS PRIOR TO ADMISSION: Pt reports since previous admission following hip sx, he has  not been ambulation and been mostly in bed. His son A him w/ ADLs and transfers to [facility].    HOME SUPPORT: Pt lives with son.    Occupational Therapy Goals:  Initiated 01/28/2022  Patient/Family stated goal: I want to get better  Patient will perform grooming in standing  with min  A within 7 day(s).  Patient will perform UB bathing with Independence within 7 day(s).  Patient will perform LB bathing with Minimal Assist within 7 day(s).  Patient will perform toilet transfers with Minimal Assist  within 7 day(s).  Patient will perform all aspects of toileting with IND within 7 day(s).  Patient will participate in upper extremity therapeutic exercise/activities with Independence within 7 day(s).    Outcome: Progressing

## 2022-01-28 NOTE — Plan of Care (Signed)
PHYSICAL THERAPY EVALUATION  Patient: Carlos Petersen (71 y.o. male)  Date: 01/28/2022  Primary Diagnosis: Confusion [R41.0]  Diarrhea, unspecified type [R19.7]  AMS (altered mental status) [R41.82]       Precautions: Weight Bearing, General Precautions, Bed Alarm Right Lower Extremity Weight Bearing: Weight Bearing As Tolerated              In place during session: Peripheral IV and External Catheter  ASSESSMENT  Pt is a 71 y.o. male admitted on 01/26/2022 for  having creased bowel movement for the last 2 weeks.  Is reporting 3-4 bowel movements every day that is described as loose, darker, and had a change in the smell of urine. ; pt currently being treated for c-diff . Pt semi supine  upon PT/OT arrival, agreeable to evaluation. Pt A&O x 4.    At baseline patient lives with son and reported that son gets him in the [facility].He has not been able to amb since his Sx last month though patient ambulated with therapy 10 ft on last PT notes that adm.Patient reported he has HH at home.On examination patient exhibits decreased strength left hip muscles Flexors and abductor more.Also appears to keep his Council Mechanic very close together.required education and pillow in between at the end of the session.  Patient stood up with mod Ax2 and unable to take any steps with LLE, difficulty WB on LLE.Patient required to be assisted back in bed.  Based on the objective data described below, the patient currently presents with impaired functional mobility, impaired ability to perform high-level IADLs, decreased ROM, impaired strength, poor body mechanics, poor safety awareness, poor attention/concentration, impaired balance, and impaired posture. (See below for objective details and assist levels).     Overall pt tolerated session fair today with therapy. Pt will benefit from continued skilled PT to address above deficits and return to PLOF. Current PT DC recommendation Skilled Nursing Facility once medically appropriate.    Current Level of  Function Impacting Discharge (mobility/balance): decreased mobility    Other factors to consider for discharge: patient's current support system is unable to meet their requirements for physical assistance, high risk for falls, and concern for safely navigating or managing the home environment     PLAN :  Recommendations and Planned Interventions: bed mobility training, transfer training, gait training, therapeutic exercises, patient and family training/education, and therapeutic activities    Recommend for staff: Encourage HEP in prep for ADLs/mobility and Frequent repositioning to prevent skin breakdown    Frequency/Duration: Patient will be followed by physical therapy:  2-3x/week to address goals.    Recommendation for discharge: (in order for the patient to meet his/her long term goals)  Skilled Nursing Facility    IF patient discharges home will need the following DME: patient owns DME required for discharge         SUBJECTIVE:   Patient stated "I am ok."    OBJECTIVE DATA SUMMARY:   HISTORY:    Past Medical History:   Diagnosis Date    Cerebral artery occlusion with cerebral infarction ([facility])     COPD (chronic obstructive pulmonary disease) ([facility])     Depression     Diabetes ([facility])     Emphysema lung ([facility])     Heart disease     Hypercholesterolemia     Parkinson disease ([facility])     Pulmonary embolism ([facility])      Past Surgical History:   Procedure Laterality Date    CARDIAC DEFIBRILLATOR PLACEMENT  COLONOSCOPY      PACEMAKER      PACEMAKER PLACEMENT      TOTAL HIP ARTHROPLASTY Right 01/2016       Home Situation:  Social/Functional History  Lives With: Son  Type of Home: House  Home Layout: One level  Home Access: Level entry (2 steps to get to the bathroom)  Bathroom Shower/Tub: Electrical engineerTub/Shower unit  Bathroom Toilet: Standard  Bathroom Equipment: 3-in-1 commode  Bathroom Accessibility: Accessible (Pt states he does not use.)  Home Equipment: Ephraim Hamburgerane, Walker, rolling, Wheelchair-electric  Receives Help From: Family  ADL  Assistance: Needs assistance  Toileting: Needs assistance  Homemaking Assistance: Needs assistance  Homemaking Responsibilities: No  Ambulation Assistance: Surveyor, mineralson-ambulatory  Transfer Assistance: Needs assistance  Active Driver: No  Occupation: Retired    Biomedical engineerCognitive/Behavioral Status:  Orientation  Orientation Level: Oriented X4  Cognition  Overall Cognitive Status: WNL    Skin: dressing intact left hip    Edema:     Hearing:   Hearing  Hearing: Within functional limits    Vision/Perceptual:          Vision  Vision: Within Functional Limits       Strength:    Strength: Generally decreased, functional    Range Of Motion:  AROM: Generally decreased, functional       Coordination:        Tone & Sensation:   Tone: Normal         Functional Mobility:  Bed Mobility:     Bed Mobility Training  Bed Mobility Training: Yes  Rolling: Moderate assistance  Supine to Sit: Moderate assistance  Sit to Supine: Minimum assistance;Moderate assistance  Scooting: Moderate assistance  Transfers:     Art therapistTransfer Training  Transfer Training: Yes  Sit to Stand: Moderate assistance;Assist X2  Stand to Sit: Moderate assistance;Assist X1  Balance:               Balance  Sitting: Intact  Standing: Impaired  Standing - Static: Fair;Constant support  Standing - Dynamic: Poor;Constant support                                           Therapeutic Exercises:   AP. Heel slides assisted in bed 10 reps each    Functional Measure:  [facility] [employer] AM-PACT "6 Clicks"         Basic Mobility Inpatient Short Form  How much difficulty does the patient currently have... Unable A Lot A Little None   1.  Turning over in bed (including adjusting bedclothes, sheets and blankets)?   []  1   []  2   [x]  3   []  4   2.  Sitting down on and standing up from a chair with arms ( e.g., wheelchair, bedside commode, etc.)   []  1   [x]  2   []  3   []  4   3.  Moving from lying on back to sitting on the side of the bed?   []  1   [x]  2   []  3   []  4          How much help from another  person does the patient currently need... Total A Lot A Little None   4.  Moving to and from a bed to a chair (including a wheelchair)?   [x]  1   []  2   []  3   []   4   5.  Need to walk in hospital room?   [x]  1   []  2   []  3   []  4   6.  Climbing 3-5 steps with a railing?   [x]  1   []  2   []  3   []  4    2007, Trustees of , under license to [place], [place]. All rights reserved     Score:  Initial: 10/24 Most Recent: X (Date: 01/28/2022 )   Interpretation of Tool:  Represents activities that are increasingly more difficult (i.e. Bed mobility, Transfers, Gait).  Score 24 23 22-20 19-15 14-10 9-7 6   Modifier CH CI CJ CK CL CM CN         Physical Therapy Evaluation Charge Determination   History Examination Presentation Decision-Making   LOW Complexity : Zero comorbidities / personal factors that will impact the outcome / POC LOW Complexity : 1-2 Standardized tests and measures addressing body structure, function, activity limitation and / or participation in recreation  LOW Complexity : Stable, uncomplicated  Other outcome measures ampac 6  LOW      Based on the above components, the patient evaluation is determined to be of the following complexity level: LOW          Pain Rating:  0/10   Pain Intervention(s):       Activity Tolerance:   Good and Fair     After treatment patient left in no apparent distress:   Bed locked and in lowest position Patient left in no apparent distress in bed, Call bell within reach, Bed/ chair alarm activated, Side rails x3, and Heels elevated for pressure relief and nsg updated.    Problem: Physical Therapy - Adult  Goal: By Discharge: Performs mobility at highest level of function for planned discharge setting.  See evaluation for individualized goals.  Description: FUNCTIONAL STATUS PRIOR TO ADMISSION: The patient was functional at the wheelchair level and required maximum assistancefor transfers to the chair.    HOME SUPPORT PRIOR TO ADMISSION: The patient lived with son  and required moderate assistance for ADLs.    Physical Therapy Goals  Initiated 01/28/2022  Pt stated goal: go home  Pt will be I with LE HEP in 7 days.  Pt will perform bed mobility with Minimal Assist in 7 days.  Pt will perform transfers with Minimal Assist in 7 days.   Pt will amb 10 feet with LRAD safely with Moderate Assist in 7 days.   Pt will verbalize and demonstrate compliance with hip precautions to include no hip flex/IR/ADD in 7 days.   Pt will demonstrate improvement in standing balance from fair  to fair + in 7 days.    Outcome: Progressing     COMMUNICATION/EDUCATION:   The patient's plan of care was discussed with: Occupational therapist, Registered nurse, and Case management    Patient Education  Education Given To: Patient  Education Provided: Role of Therapy;Plan of Care;Transfer Training;Home Exercise Program;Fall Prevention Strategies  Barriers to Learning: None  Education Outcome: Continued education needed;Verbalized understanding             Thank you for this referral.  , PT  Minutes: 23

## 2022-01-28 NOTE — Plan of Care (Signed)
Problem: Discharge Planning  Goal: Discharge to home or other facility with appropriate resources  Outcome: Progressing  Flowsheets  Taken 01/28/2022 1025 by Hardie Pulley, RN  Discharge to home or other facility with appropriate resources: Identify barriers to discharge with patient and caregiver  Taken 01/27/2022 2130 by Wynona Luna, RN  Discharge to home or other facility with appropriate resources: Identify barriers to discharge with patient and caregiver     Problem: Skin/Tissue Integrity  Goal: Absence of new skin breakdown  Description: 1.  Monitor for areas of redness and/or skin breakdown  2.  Assess vascular access sites hourly  3.  Every 4-6 hours minimum:  Change oxygen saturation probe site  4.  Every 4-6 hours:  If on nasal continuous positive airway pressure, respiratory therapy assess nares and determine need for appliance change or resting period.  Outcome: Progressing     Problem: Safety - Adult  Goal: Free from fall injury  Outcome: Progressing     Problem: ABCDS Injury Assessment  Goal: Absence of physical injury  Outcome: Progressing     Problem: Chronic Conditions and Co-morbidities  Goal: Patient's chronic conditions and co-morbidity symptoms are monitored and maintained or improved  Outcome: Progressing  Flowsheets  Taken 01/28/2022 1025 by Hardie Pulley, RN  Care Plan - Patient's Chronic Conditions and Co-Morbidity Symptoms are Monitored and Maintained or Improved: Monitor and assess patient's chronic conditions and comorbid symptoms for stability, deterioration, or improvement  Taken 01/27/2022 2130 by Wynona Luna, RN  Care Plan - Patient's Chronic Conditions and Co-Morbidity Symptoms are Monitored and Maintained or Improved:   Monitor and assess patient's chronic conditions and comorbid symptoms for stability, deterioration, or improvement   Collaborate with multidisciplinary team to address chronic and comorbid conditions and prevent exacerbation or deterioration

## 2022-01-28 NOTE — Consults (Signed)
Comprehensive Nutrition Assessment    Type and Reason for Visit:  Initial, Consult, Positive Nutrition Screen (Poor intake/appetite >5 days; MST 2 for 2-13# wt loss w/ poor po d/t decreased appetite)    Nutrition Recommendations/Plan:   5-Carb, regular diet  Glucerna 1x/day   Monitor and document intake %, supplement tolerance, bms, wts in I/Os thanks     Malnutrition Assessment:  Malnutrition Status:  Moderate malnutrition (01/28/22 1359)    Context:  Chronic Illness     Findings of the 6 clinical characteristics of malnutrition:  Energy Intake:  Mild decrease in energy intake (Comment) (>6 weeks ago, since improved)  Weight Loss:  Greater than 7.5% over 3 months (10.2% <3 mo's)     Body Fat Loss:  Mild body fat loss Buccal region   Muscle Mass Loss:  Mild muscle mass loss Clavicles (pectoralis & deltoids), Temples (temporalis), Scapula (trapezius), Hand (interosseous)  Fluid Accumulation:  No significant fluid accumulation    Grip Strength:  Normal grip strength    Nutrition Assessment:    Pt admitted w/ AMS 2/2 metabolic encephalopathy, loose stools likely 2/2 C. diff. Pt stated hx of wt loss w/ poor appetite, but appetite ahs improved in last 6 wks. Pt stated UBW >yr ago 247#, CBW per bed scale 176#. Pt eating well while IP, will add ONS to support adequate intake. Diarreha w/ medical management, consider probiotic and/or banatrol if persistent.  Labs: K 3.4, Cr 0.50, Ca 7.6, POC BG 100-108, H/H 8.6/29.9. Meds: amiodarone, eliquis, carbidopa, humalog, ppi, ranexa, crestor, vancomycin.    Nutrition Related Findings:    NPFE showed mild loss, consistent w/ improved nutr x6 weeks after signifcnt loss. No c, N/Vx1 <24 hrs, +D 2/2 Cdiff. BM 6/1. No chew/swallow issues. No edema. Wound Type: None       Current Nutrition Intake & Therapies:    Average Meal Intake: 26-50%, 51-75%  Average Supplements Intake: None Ordered  ADULT DIET; Regular; 5 carb choices (75 gm/meal)    Anthropometric Measures:  Height: 180.3 cm (5'  10.98")  Ideal Body Weight (IBW): 172 lbs (78 kg)    Current Body Weight: 176 lb 5.9 oz (80 kg) (RD obtained 6/1, veracity?), 102.5 % IBW. Weight Source: Bed Scale  Current BMI (kg/m2): 24.6  Weight Adjustment For: No Adjustment   BMI Categories: Normal Weight (BMI 18.5-24.9)    Wt Readings from Last 3 Encounters:   01/26/22 95.3 kg (210 lb)   01/22/22 90.7 kg (200 lb)   01/15/22 90.7 kg (200 lb)       Estimated Daily Nutrient Needs:  Energy Requirements Based On: Kcal/kg  Weight Used for Energy Requirements: Current  Energy (kcal/day): 7741-4239 kcal/d (25-30 kcal/kg)  Weight Used for Protein Requirements: Current  Protein (g/day): 95-115 g/d (1.0-1.2 g/kg)  Method Used for Fluid Requirements: 1 ml/kcal  Fluid (ml/day): 2383 mL/day    Nutrition Diagnosis:   Moderate malnutrition, In context of chronic illness related to inadequate protein-energy intake as evidenced by poor intake prior to admission, Criteria as identified in malnutrition assessment    Nutrition Interventions:   Food and/or Nutrient Delivery: Continue Current Diet, Start Oral Nutrition Supplement  Nutrition Education/Counseling: No recommendation at this time  Coordination of Nutrition Care: Continue to monitor while inpatient  Plan of Care discussed with: Pt, RN    Goals:  Goals: PO intake 50% or greater, Meet at least 75% of estimated needs, by next RD assessment, other (specify)  Specify Other Goals: Wt gain vs maintenance 1-2#/wk  Nutrition Monitoring and Evaluation:   Behavioral-Environmental Outcomes: None Identified  Food/Nutrient Intake Outcomes: Food and Nutrient Intake, Supplement Intake  Physical Signs/Symptoms Outcomes: Biochemical Data, Meal Time Behavior, GI Status, Weight    Discharge Planning:    No discharge needs at this time     Garrard Call, RD  Contact: Ext [phone] or via PerfectServe

## 2022-01-28 NOTE — Progress Notes (Signed)
Hospitalist Progress Note               Daily Progress Note: 01/28/2022 8:10 AM  Hospital course:     Carlos Petersen is a 71 y.o.  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  Proximately 1 month ago he underwent ORIF of left hip fracture.  He has done well in terms of recovery except for generalized weakness that is actually gotten worse in the last 1 to 2 weeks.  There is no focal weakness other than he describes some right arm weakness that is chronic and not acute.  He has has chronic loose stools but does admit that his diarrhea has been worse recently.  P.o. intake has been diminished for several weeks but no abdominal pain or blood in stools.  No fevers or chills.  Of note he was recently treated for urinary tract infection with a cephalosporin.  Currently no dysuria.  Regarding his episode of confusion that occurred yesterday he says he just got a large period time.  No loss of consciousness.  No associated headache visual changes or focal weakness or paresthesias at that time.  In talking with his son and patient it appears that his mental status is now improved and that is currently at baseline.  Due to these complaints his orthopedic suggested he present to the emergency room.  He was found to be afebrile and hemodynamically stable.  Head CT without acute process.  Labs showed normal white count and essentially normal electrolytes.  He does have chronic anemia with hemoglobin of 8.6.  Noted albumin is 1.8.  C. difficile testing was positive.  Patient placed in observation for further work-up and management    Subjective:     Examined patient at bedside.  Continues on supportive treatment for C. difficile.  Patient states he continues to have 4-5 loose diarrheal stools per day.    Assessment/Plan:   Principal Problem:    AMS (altered mental status)  Resolved  Problems:    * No resolved hospital problems. *    C difficile colitis -suspected recent antibiotic use.  He does have chronic diarrhea but certainly worse recently.  He does not appear septic and no abdominal tenderness tolerating p.o. well currently with patient in isolation and treat with a 10-day course of vancomycin.  Try to limit any antibiotics.  Monitor clinical exam for poor response to vancomycin.  -Add Florastor probiotic     Metabolic encephalopathy   -transient episode that sounds more like encephalopathy rather than acute stroke.  He did have a head CT that showed no acute process.  MRI due to pacemaker and likely consultation for stroke is low.  We will monitor his clinical exam  -[facility] telemetry neuro consulted     Parkinson's disease   -diagnosed many years ago and just on a low-dose Sinemet.  Has not had any recent follow-up with neurology in the last few years.  I wonder if some of his failure to thrive is due to Parkinson's disease.  Therapy evaluated him watching for evidence of freezing with mobility.  Continue Sinemet.     NIDDM2 complicated by HLD   -on Jardiance at home.  Given decreased intake and diarrhea we will hold oral agents and just cover with sliding scale for now     HLD in DM -continue statin     Hx PE   -  given the left hip x-ray increased risk for recurrent DVT will continue with Eliquis.  He does have anemia at least since his surgery but is actually improved and no evidence of overt bleeding.  Check iron studies     COPD/asthma  -ongoing tobacco use  -No indication for systemic steroids.  Continue Trelegy     Recent left hop fracture s/p ORIF -doing well incision is well approximated without any overt signs of infection.  He is weightbearing as tolerated.     Severe protein calorie malnutrition -albumin 1.8.  Now exacerbated by C. difficile infection.  He is tolerating p.o.  Nutrition consult add supplements especially to improve protein stores.     Anemia   -exacerbated by  recent surgery and a component of nutritional deficiencies.  Check iron studies     Generalized weakness -  PT OT consulted recommending SNF    Discussion/MDM: Patient with multiple medical comorbidities, each with high likelihood for morbidity and mortality if left untreated.   I have reviewed patient's presenting subjective and objective findings, as well as all laboratory studies, imaging studies, and vital signs to date as well as treatment rendered and patient's response to those treatments.  In addition, prior medical, surgical and relevant social and family histories were reviewed.  MDM  Reviewed: previous chart, nursing note and vitals  Reviewed previous: labs  Interpretation: labs  Total time providing critical care: 34 minutes.     DVT Prophylaxis: On Eliquis  Code Status: No Order DNR patient  POA/NOK:    Disposition and discharge barriers:   C. difficile on vancomycin oral  PT OT pending SNF placement pending?  Telemetry neuro consult pending  Care Plan discussed with: Patient, staff, IDR team        Current Facility-Administered Medications   Medication Dose Route Frequency    albuterol sulfate HFA (PROVENTIL;VENTOLIN;PROAIR) 108 (90 Base) MCG/ACT inhaler 2 puff  2 puff Inhalation Q6H PRN    amiodarone (CORDARONE) tablet 200 mg  200 mg Oral Daily    apixaban (ELIQUIS) tablet 5 mg  5 mg Oral BID    carbidopa-levodopa (SINEMET CR) 25-100 MG per extended release tablet 4 tablet  4 tablet Oral TID    pantoprazole (PROTONIX) tablet 40 mg  40 mg Oral QAM AC    ranolazine (RANEXA) extended release tablet 500 mg  500 mg Oral BID    rosuvastatin (CRESTOR) tablet 20 mg  20 mg Oral Nightly    insulin lispro (HUMALOG) injection vial 0-4 Units  0-4 Units SubCUTAneous TID WC    insulin lispro (HUMALOG) injection vial 0-4 Units  0-4 Units SubCUTAneous Nightly    glucose chewable tablet 16 g  4 tablet Oral PRN    dextrose bolus 10% 125 mL  125 mL IntraVENous PRN    Or    dextrose bolus 10% 250 mL  250 mL IntraVENous  PRN    glucagon injection 1 mg  1 mg SubCUTAneous PRN    dextrose 10 % infusion   IntraVENous Continuous PRN    budesonide-formoterol (SYMBICORT) 160-4.5 MCG/ACT inhaler 2 puff  2 puff Inhalation BID    And    tiotropium (SPIRIVA RESPIMAT) 2.5 MCG/ACT inhaler 2 puff  2 puff Inhalation Daily    vancomycin (VANCOCIN) capsule 125 mg  125 mg Oral 4x Daily        REVIEW OF SYSTEMS    Review of Systems   Constitutional:  Positive for fatigue.   Gastrointestinal:  Positive for abdominal distention, abdominal  pain and diarrhea.   Musculoskeletal:  Positive for arthralgias.   Neurological:  Positive for weakness.   Psychiatric/Behavioral:  The patient is nervous/anxious.         Objective:     BP 117/62   Pulse 77   Temp 98.4 F (36.9 C) (Oral)   Resp (!) 188   Ht 1.803 m (5\' 11" )   Wt 95.3 kg (210 lb)   SpO2 90%   BMI 29.29 kg/m    O2 Device: None (Room air)    Temp (24hrs), Avg:98.3 F (36.8 C), Min:98.1 F (36.7 C), Max:98.5 F (36.9 C)        PHYSICAL EXAM:    Physical Exam  Constitutional:       Appearance: He is ill-appearing.   Cardiovascular:      Rate and Rhythm: Normal rate and regular rhythm.   Pulmonary:      Effort: No respiratory distress.   Abdominal:      General: There is distension.      Tenderness: There is abdominal tenderness.   Musculoskeletal:      Right lower leg: No edema.      Left lower leg: No edema.   Skin:     General: Skin is dry.   Neurological:      Motor: Weakness present.        Data Review    Recent Results (from the past 24 hour(s))   Urinalysis with Microscopic    Collection Time: 01/27/22  8:40 AM   Result Value Ref Range    Color, UA Dark Yellow      Appearance Clear Clear      Specific Gravity, UA 1.024 1.003 - 1.030      pH, Urine 5.0 5.0 - 8.0      Protein, UA 30 (A) Negative mg/dL    Glucose, UA Negative Negative mg/dL    Ketones, Urine 5 (A) Negative mg/dL    Bilirubin Urine Negative Negative      Blood, Urine Negative Negative      Urobilinogen, Urine 2.0 (H) 0.1 -  1.0 EU/dL    Nitrite, Urine Negative Negative      Leukocyte Esterase, Urine Negative Negative      WBC, UA 0-4 0 - 4 /hpf    RBC, UA 0-5 0 - 5 /hpf    Epithelial Cells UA Few Few /lpf    BACTERIA, URINE 1+ (A) Negative /hpf    Mucus, UA Trace (A) Negative /lpf   POCT Glucose    Collection Time: 01/27/22 12:59 PM   Result Value Ref Range    POC Glucose 100 65 - 100 mg/dL    Performed by: Kandis Fantasialalde Karen    POCT Glucose    Collection Time: 01/27/22  5:15 PM   Result Value Ref Range    POC Glucose 104 (H) 65 - 100 mg/dL    Performed by: Kandis Fantasialalde Karen    POCT Glucose    Collection Time: 01/27/22  8:06 PM   Result Value Ref Range    POC Glucose 108 (H) 65 - 100 mg/dL    Performed by: Berdine AddisonBURDETTE COURTNEY    Basic Metabolic Panel w/ Reflex to MG    Collection Time: 01/28/22  6:01 AM   Result Value Ref Range    Sodium 138 136 - 145 mmol/L    Potassium 3.4 (L) 3.5 - 5.1 mmol/L    Chloride 107 97 - 108 mmol/L    CO2 29 21 - 32 mmol/L  Anion Gap 2 (L) 5 - 15 mmol/L    Glucose 90 65 - 100 mg/dL    BUN 9 6 - 20 mg/dL    Creatinine 1.74 (L) 0.70 - 1.30 mg/dL    Bun/Cre Ratio 18 12 - 20      Est, Glom Filt Rate >60 >60 ml/min/1.50m2    Calcium 7.6 (L) 8.5 - 10.1 mg/dL       CT CERVICAL SPINE WO CONTRAST   Final Result   1. No acute fracture or subluxation.   2. Incompletely visualized right pleural effusion.          CT HEAD WO CONTRAST   Final Result      No acute traumatic injury.             Intake and Output:  Current Shift: No intake/output data recorded.  Last three shifts: No intake/output data recorded.      Lab/Data Review:  Recent Labs     01/26/22  2303   WBC 10.7   HGB 8.6*   HCT 29.9*   PLT 210     Recent Labs     01/26/22  2303 01/28/22  0601   NA 136 138   K 3.6 3.4*   CL 104 107   CO2 28 29   GLUCOSE 109* 90   BUN 10 9   CREATININE 0.62* 0.50*   CALCIUM 7.7* 7.6*   LABALBU 1.8*  --    BILITOT 0.2  --    AST 17  --    ALT <6*  --      No results for input(s): PHART, PCO2ART, PO2ART, HCO3ART, BEART, TCO2ARRT, HGBART,  PO2CORART, FIO2A, O2SATART, OXYHEM, CARBOXHGBART, METHGBART, O2CONTART, PHCORART, TEMP in the last 72 hours.    Invalid input(s): PCO2COART  Recent Results (from the past 24 hour(s))   Urinalysis with Microscopic    Collection Time: 01/27/22  8:40 AM   Result Value Ref Range    Color, UA Dark Yellow      Appearance Clear Clear      Specific Gravity, UA 1.024 1.003 - 1.030      pH, Urine 5.0 5.0 - 8.0      Protein, UA 30 (A) Negative mg/dL    Glucose, UA Negative Negative mg/dL    Ketones, Urine 5 (A) Negative mg/dL    Bilirubin Urine Negative Negative      Blood, Urine Negative Negative      Urobilinogen, Urine 2.0 (H) 0.1 - 1.0 EU/dL    Nitrite, Urine Negative Negative      Leukocyte Esterase, Urine Negative Negative      WBC, UA 0-4 0 - 4 /hpf    RBC, UA 0-5 0 - 5 /hpf    Epithelial Cells UA Few Few /lpf    BACTERIA, URINE 1+ (A) Negative /hpf    Mucus, UA Trace (A) Negative /lpf   POCT Glucose    Collection Time: 01/27/22 12:59 PM   Result Value Ref Range    POC Glucose 100 65 - 100 mg/dL    Performed by: Kandis Fantasia    POCT Glucose    Collection Time: 01/27/22  5:15 PM   Result Value Ref Range    POC Glucose 104 (H) 65 - 100 mg/dL    Performed by: Kandis Fantasia    POCT Glucose    Collection Time: 01/27/22  8:06 PM   Result Value Ref Range    POC Glucose 108 (H) 65 - 100 mg/dL  Performed by: Berdine Addison    Basic Metabolic Panel w/ Reflex to MG    Collection Time: 01/28/22  6:01 AM   Result Value Ref Range    Sodium 138 136 - 145 mmol/L    Potassium 3.4 (L) 3.5 - 5.1 mmol/L    Chloride 107 97 - 108 mmol/L    CO2 29 21 - 32 mmol/L    Anion Gap 2 (L) 5 - 15 mmol/L    Glucose 90 65 - 100 mg/dL    BUN 9 6 - 20 mg/dL    Creatinine 1.61 (L) 0.70 - 1.30 mg/dL    Bun/Cre Ratio 18 12 - 20      Est, Glom Filt Rate >60 >60 ml/min/1.45m2    Calcium 7.6 (L) 8.5 - 10.1 mg/dL           ______________________________________________________________________________    Rebekah Chesterfield, APRN - NP    This is dictation was  done by dragon, computer voice recognition software.  Quite often unanticipated grammatical, syntax, homophones and other interpretive errors or inadvertently transcribed by the computer software.  Please excuse errors that have escaped final proofreading.  Thank you.

## 2022-01-29 LAB — CBC WITH AUTO DIFFERENTIAL
Absolute Immature Granulocyte: 0.1 10*3/uL — ABNORMAL HIGH (ref 0.00–0.04)
Basophils %: 1 % (ref 0–1)
Basophils Absolute: 0 10*3/uL (ref 0.0–0.1)
Eosinophils %: 2 % (ref 0–7)
Eosinophils Absolute: 0.1 10*3/uL (ref 0.0–0.4)
Hematocrit: 29.9 % — ABNORMAL LOW (ref 36.6–50.3)
Hemoglobin: 8.8 g/dL — ABNORMAL LOW (ref 12.1–17.0)
Immature Granulocytes: 1 % — ABNORMAL HIGH (ref 0–0.5)
Lymphocytes %: 31 % (ref 12–49)
Lymphocytes Absolute: 2 10*3/uL (ref 0.8–3.5)
MCH: 24.6 PG — ABNORMAL LOW (ref 26.0–34.0)
MCHC: 29.4 g/dL — ABNORMAL LOW (ref 30.0–36.5)
MCV: 83.5 FL (ref 80.0–99.0)
MPV: 8.4 FL — ABNORMAL LOW (ref 8.9–12.9)
Monocytes %: 8 % (ref 5–13)
Monocytes Absolute: 0.5 10*3/uL (ref 0.0–1.0)
Neutrophils %: 57 % (ref 32–75)
Neutrophils Absolute: 3.7 10*3/uL (ref 1.8–8.0)
Nucleated RBCs: 0 PER 100 WBC
Platelets: 224 10*3/uL (ref 150–400)
RBC: 3.58 M/uL — ABNORMAL LOW (ref 4.10–5.70)
RDW: 20.9 % — ABNORMAL HIGH (ref 11.5–14.5)
WBC: 6.3 10*3/uL (ref 4.1–11.1)
nRBC: 0 10*3/uL (ref 0.00–0.01)

## 2022-01-29 LAB — BASIC METABOLIC PANEL
Anion Gap: 2 mmol/L — ABNORMAL LOW (ref 5–15)
BUN: 6 mg/dL (ref 6–20)
Bun/Cre Ratio: 13 (ref 12–20)
CO2: 27 mmol/L (ref 21–32)
Calcium: 7.5 mg/dL — ABNORMAL LOW (ref 8.5–10.1)
Chloride: 107 mmol/L (ref 97–108)
Creatinine: 0.47 mg/dL — ABNORMAL LOW (ref 0.70–1.30)
Est, Glom Filt Rate: >60 mL/min/{1.73_m2} (ref >60)
Glucose: 88 mg/dL (ref 65–100)
Potassium: 3.8 mmol/L (ref 3.5–5.1)
Sodium: 136 mmol/L (ref 136–145)

## 2022-01-29 LAB — IRON AND TIBC
Iron % Saturation: 10 % — ABNORMAL LOW (ref 20–50)
Iron: 22 ug/dL — ABNORMAL LOW (ref 35–150)
TIBC: 211 ug/dL — ABNORMAL LOW (ref 250–450)

## 2022-01-29 LAB — POCT GLUCOSE: POC Glucose: 114 mg/dL — ABNORMAL HIGH (ref 65–100)

## 2022-01-29 MED ORDER — FERROUS SULFATE 325 (65 FE) MG PO TABS
325 (65 Fe) MG | Freq: Two times a day (BID) | ORAL | Status: AC
Start: 2022-01-29 — End: 2022-02-01
  Administered 2022-01-29 – 2022-02-01 (×7): 325 mg via ORAL

## 2022-01-29 MED FILL — SPIRIVA RESPIMAT 2.5 MCG/ACT IN AERS: 2.5 MCG/ACT | RESPIRATORY_TRACT | Qty: 4

## 2022-01-29 MED FILL — ACIDOPHILUS/CITRUS PECTIN PO TABS: ORAL | Qty: 1

## 2022-01-29 MED FILL — FERROUS SULFATE 325 (65 FE) MG PO TABS: 325 (65 Fe) MG | ORAL | Qty: 1

## 2022-01-29 MED FILL — SYMBICORT 160-4.5 MCG/ACT IN AERO: 160-4.5 MCG/ACT | RESPIRATORY_TRACT | Qty: 6

## 2022-01-29 NOTE — Plan of Care (Signed)
PHYSICAL THERAPY TREATMENT     Patient: Carlos Petersen (71 y.o. male)  Date: 01/29/2022  Diagnosis: Confusion [R41.0]  Diarrhea, unspecified type [R19.7]  AMS (altered mental status) [R41.82] AMS (altered mental status)      Precautions:  Weight Bearing, General Precautions, Bed Alarm Right Lower Extremity Weight Bearing: Weight Bearing As Tolerated              In place during session:     Chart, physical therapy assessment, plan of care and goals were reviewed.    ASSESSMENT  Patient continues with skilled PT services and is progressing towards goals. Pt received semi supine  upon PT /OT arrival, agreeable to session.(See below for objective details and assist levels). Pt. Improved with bed mobility this session and required less assistance. Pt. Continues to need extensive assist of 2 for standing and taking a few side steps with RW. Pt. With decrease step length/stride along with cadence. Pt. Required constant  vcs to extend knees and arms while standing with RW.Pt. Required max assist for balance and to advance RW during gt. Pt. Fatigues easily and requires a few rest breaks throughout session.     Overall pt tolerated session fair  today with mobility.   Will continue to benefit from skilled PT services, and will continue to progress as tolerated.     Current Level of Function Impacting Discharge (mobility/balance): Max A X 2 for Mobility    Other factors to consider for discharge: poor safety awareness, high risk for falls, not safe to be alone, and concern for safely navigating or managing the home environment       PLAN :  Patient continues to benefit from skilled intervention to address impaired functional mobility, impaired strength, poor body mechanics, decreased activity tolerance, poor safety awareness, impaired balance, and impaired posture. Continue treatment per established plan of care to address goals.    Recommend with staff: Out of bed to chair for meals, Encourage HEP in prep for  ADLs/mobility, Use of bed/chair alarm for safety, and LE elevation for management of edema    Recommendation for discharge: (in order for the patient to meet his/her long term goals)  Skilled Nursing Facility    IF patient discharges home will need the following DME: TBD       SUBJECTIVE:   Patient stated "I want to get up, I'm ready to try." "I haven't stood in months." "My son lifts me."    OBJECTIVE DATA SUMMARY:   Critical Behavior:  Orientation  Orientation Level: Oriented X4  Cognition  Overall Cognitive Status: WNL    Functional Mobility Training:  Bed Mobility:  Bed Mobility Training  Rolling: Minimum assistance;Assist X1;Additional time  Supine to Sit: Minimum assistance;Assist X1;Additional time  Scooting: Minimum assistance;Additional time  Transfers:  Transfer Training  Sit to Stand: Moderate assistance;Assist X2  Stand to Sit: Moderate assistance;Assist X1  Bed to Chair: Moderate assistance;Assist X2;Maximum assistance  Balance:  Balance  Sitting: Intact  Standing: Impaired  Standing - Static: Fair;Constant support  Standing - Dynamic: Poor;Constant support  Ambulation/Gait Training:     Gait  Overall Level of Assistance: Moderate assistance;Maximum assistance;Assist X2  Interventions: Weight shifting training/pressure relief;Safety awareness training;Verbal cues;Tactile cues;Demonstration  Base of Support: Narrowed  Speed/Cadence: Shuffled;Slow  Step Length: Left shortened;Right shortened  Stance: Left decreased  Gait Abnormalities: Shuffling gait  Distance (ft): 2 Feet  Assistive Device: Walker, rolling;Gait belt      Therapeutic Exercises:       EXERCISE  Sets   Reps   Active Active Assist   Passive Self ROM   Comments   Ankle Pumps  20 [x]  []  []  []     Quad Sets/Glut Sets  20 [x]  []  []  []     Hamstring Sets   []  []  []  []     Short Arc Quads 2 5 []  [x]  []  []  Min on L   Heel Slides  12 [x]  []  []  []     Straight Leg Raises   []  []  []  []     Hip abd/add 2 6 []  [x]  []  []  Min asssit on L   Long Arc Quads 2 6  [x]  []  []  []  Cues to extend leg out farther   Marching   []  []  []  []        []  []  []  []        Pain Rating:  0/10   Pain Intervention(s):   nursing notified and pain is at a level acceptable to the patient    Activity Tolerance:   Fair  and requires frequent rest breaks    After treatment patient left in no apparent distress:   Bed locked and returned to lowest position, Patient left in no apparent distress sitting up in chair and Call bell within reach, and nsg updated         COMMUNICATION/COLLABORATION:   The patient's plan of care was discussed with: Occupational therapy assistant and Registered nurse    Patient Education  Education Given To: Patient  Education Provided: Role of Therapy;Plan of Care;Transfer Training;Home Exercise Program;Fall Prevention Strategies  Education Method: Demonstration;Verbal;Teach Back  Barriers to Learning: None  Education Outcome: Continued education needed;Verbalized understanding  PT/OT sessions occurred together for increased safety of pt and clinician.     , PTA  Minutes: 47           Problem: Physical Therapy - Adult  Goal: By Discharge: Performs mobility at highest level of function for planned discharge setting.  See evaluation for individualized goals.  Description: FUNCTIONAL STATUS PRIOR TO ADMISSION: The patient was functional at the wheelchair level and required maximum assistancefor transfers to the chair.    HOME SUPPORT PRIOR TO ADMISSION: The patient lived with son and required moderate assistance for ADLs.    Physical Therapy Goals  Initiated 01/28/2022  Pt stated goal: go home  Pt will be I with LE HEP in 7 days.  Pt will perform bed mobility with Minimal Assist in 7 days.  Pt will perform transfers with Minimal Assist in 7 days.   Pt will amb 10 feet with LRAD safely with Moderate Assist in 7 days.   Pt will verbalize and demonstrate compliance with hip precautions to include no hip flex/IR/ADD in 7 days.   Pt will demonstrate improvement in standing  balance from fair  to fair + in 7 days.    Outcome: Progressing

## 2022-01-29 NOTE — Progress Notes (Signed)
Hospitalist Progress Note               Daily Progress Note: 01/29/2022 9:56 AM  Hospital course:     Carlos Petersen is a 71 y.o.  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  Proximately 1 month ago he underwent ORIF of left hip fracture.  He has done well in terms of recovery except for generalized weakness that is actually gotten worse in the last 1 to 2 weeks.  There is no focal weakness other than he describes some right arm weakness that is chronic and not acute.  He has has chronic loose stools but does admit that his diarrhea has been worse recently.  P.o. intake has been diminished for several weeks but no abdominal pain or blood in stools.  No fevers or chills.  Of note he was recently treated for urinary tract infection with a cephalosporin.  Currently no dysuria.  Regarding his episode of confusion that occurred yesterday he says he just got a large period time.  No loss of consciousness.  No associated headache visual changes or focal weakness or paresthesias at that time.  In talking with his son and patient it appears that his mental status is now improved and that is currently at baseline.  Due to these complaints his orthopedic suggested he present to the emergency room.  He was found to be afebrile and hemodynamically stable.  Head CT without acute process.  Labs showed normal white count and essentially normal electrolytes.  He does have chronic anemia with hemoglobin of 8.6.  Noted albumin is 1.8.  C. difficile testing was positive.  Patient placed in observation for further work-up and management.    01/29/2022 continues on oral vancomycin and supportive therapy for C. difficile.  PT OT recommending SNF placement.      Subjective:     Follow-up examination of patient at bedside.  Continues on supportive treatment for C. difficile.  Patient states he  continues to have  loose diarrheal but starting to improve.    Assessment/Plan:   Principal Problem:    AMS (altered mental status)  Active Problems:    Depression    Mixed hyperlipidemia    Parkinson's disease (HCC)    Hip fracture (HCC)    C. difficile colitis    Diarrhea    Diabetes (HCC)    Generalized weakness  Resolved Problems:    * No resolved hospital problems. *    C difficile colitis -suspected recent antibiotic use.  He does have chronic diarrhea but certainly worse recently.  He does not appear septic and no abdominal tenderness tolerating p.o. well currently with patient in isolation and treat with a 10-day course of vancomycin.  Try to limit any antibiotics.  Monitor clinical exam for poor response to vancomycin.  -Add Florastor probiotic     Metabolic encephalopathy   -transient episode that sounds more like encephalopathy rather than acute stroke.  He did have a head CT that showed no acute process.  MRI due to pacemaker and likely consultation for stroke is low.  We will monitor his clinical exam  -[facility] telemetry neuro consulted     Parkinson's disease   -diagnosed many years ago and just on a low-dose Sinemet.  Has not had any recent follow-up with neurology in the last few years.  I wonder if some of his failure to  thrive is due to Parkinson's disease.  Therapy evaluated him watching for evidence of freezing with mobility.  Continue Sinemet.     NIDDM2 complicated by HLD   -on Jardiance at home.  Given decreased intake and diarrhea we will hold oral agents and just cover with sliding scale for now     HLD in DM -continue statin     Hx PE   -given the left hip x-ray increased risk for recurrent DVT will continue with Eliquis.  He does have anemia at least since his surgery but is actually improved and no evidence of overt bleeding.  Check iron studies     COPD/asthma  -ongoing tobacco use  -No indication for systemic steroids.  Continue Trelegy     Recent left hop fracture s/p ORIF -doing well  incision is well approximated without any overt signs of infection.  He is weightbearing as tolerated.     Severe protein calorie malnutrition -albumin 1.8.  Now exacerbated by C. difficile infection.  He is tolerating p.o.  Nutrition consult add supplements especially to improve protein stores.     Anemia   -exacerbated by recent surgery and a component of nutritional deficiencies.  Check iron studies  -We will start iron replacement therapy     Generalized weakness -  PT OT consulted recommending SNF    Discussion/MDM: Patient with multiple medical comorbidities, each with high likelihood for morbidity and mortality if left untreated.   I have reviewed patient's presenting subjective and objective findings, as well as all laboratory studies, imaging studies, and vital signs to date as well as treatment rendered and patient's response to those treatments.  In addition, prior medical, surgical and relevant social and family histories were reviewed.  MDM  Reviewed: previous chart, nursing note and vitals  Reviewed previous: labs  Interpretation: labs  Total time providing critical care: 36 minutes.     DVT Prophylaxis: On Eliquis  Code Status: No Order DNR patient  POA/NOK:Spoke with son on phone, updates.  [phone]    Disposition and discharge barriers:   C. difficile on vancomycin oral  PT OT pending SNF placement pending?  Telemetry neuro consult pending    Care Plan discussed with: Patient, staff, IDR team      Current Facility-Administered Medications   Medication Dose Route Frequency    ferrous sulfate (IRON 325) tablet 325 mg  325 mg Oral BID WC    acidophilus/citrus pectin 1 tablet  1 tablet Oral BID    albuterol sulfate HFA (PROVENTIL;VENTOLIN;PROAIR) 108 (90 Base) MCG/ACT inhaler 2 puff  2 puff Inhalation Q6H PRN    amiodarone (CORDARONE) tablet 200 mg  200 mg Oral Daily    apixaban (ELIQUIS) tablet 5 mg  5 mg Oral BID    carbidopa-levodopa (SINEMET CR) 25-100 MG per extended release tablet 4 tablet  4  tablet Oral TID    pantoprazole (PROTONIX) tablet 40 mg  40 mg Oral QAM AC    ranolazine (RANEXA) extended release tablet 500 mg  500 mg Oral BID    rosuvastatin (CRESTOR) tablet 20 mg  20 mg Oral Nightly    insulin lispro (HUMALOG) injection vial 0-4 Units  0-4 Units SubCUTAneous TID WC    insulin lispro (HUMALOG) injection vial 0-4 Units  0-4 Units SubCUTAneous Nightly    glucose chewable tablet 16 g  4 tablet Oral PRN    dextrose bolus 10% 125 mL  125 mL IntraVENous PRN    Or    dextrose bolus 10%  250 mL  250 mL IntraVENous PRN    glucagon injection 1 mg  1 mg SubCUTAneous PRN    dextrose 10 % infusion   IntraVENous Continuous PRN    budesonide-formoterol (SYMBICORT) 160-4.5 MCG/ACT inhaler 2 puff  2 puff Inhalation BID    And    tiotropium (SPIRIVA RESPIMAT) 2.5 MCG/ACT inhaler 2 puff  2 puff Inhalation Daily    vancomycin (VANCOCIN) capsule 125 mg  125 mg Oral 4x Daily        REVIEW OF SYSTEMS    Review of Systems   Constitutional:  Positive for fatigue.   Gastrointestinal:  Positive for abdominal distention, abdominal pain and diarrhea.   Musculoskeletal:  Positive for arthralgias.   Neurological:  Positive for weakness.   Psychiatric/Behavioral:  The patient is nervous/anxious.         Objective:     BP (!) 112/55   Pulse 68   Temp 97.6 F (36.4 C) (Oral)   Resp 18   Ht 1.803 m (5' 10.98")   Wt 95.3 kg (210 lb)   SpO2 95%   BMI 29.30 kg/m    O2 Device: None (Room air)    Temp (24hrs), Avg:97.8 F (36.6 C), Min:97.6 F (36.4 C), Max:98 F (36.7 C)        PHYSICAL EXAM:    Physical Exam  Constitutional:       Appearance: He is ill-appearing.   Cardiovascular:      Rate and Rhythm: Normal rate and regular rhythm.   Pulmonary:      Effort: No respiratory distress.   Abdominal:      General: There is distension.      Tenderness: There is abdominal tenderness.   Musculoskeletal:      Right lower leg: No edema.      Left lower leg: No edema.   Skin:     General: Skin is dry.   Neurological:      Motor:  Weakness present.        Data Review    Recent Results (from the past 24 hour(s))   Basic Metabolic Panel    Collection Time: 01/28/22 10:55 AM   Result Value Ref Range    Sodium 137 136 - 145 mmol/L    Potassium 3.5 3.5 - 5.1 mmol/L    Chloride 106 97 - 108 mmol/L    CO2 27 21 - 32 mmol/L    Anion Gap 4 (L) 5 - 15 mmol/L    Glucose 103 (H) 65 - 100 mg/dL    BUN 8 6 - 20 mg/dL    Creatinine 4.17 (L) 0.70 - 1.30 mg/dL    Bun/Cre Ratio 17 12 - 20      Est, Glom Filt Rate >60 >60 ml/min/1.95m2    Calcium 7.4 (L) 8.5 - 10.1 mg/dL   CBC with Auto Differential    Collection Time: 01/28/22 10:55 AM   Result Value Ref Range    WBC 6.1 4.1 - 11.1 K/uL    RBC 3.17 (L) 4.10 - 5.70 M/uL    Hemoglobin 7.8 (L) 12.1 - 17.0 g/dL    Hematocrit 40.8 (L) 36.6 - 50.3 %    MCV 83.6 80.0 - 99.0 FL    MCH 24.6 (L) 26.0 - 34.0 PG    MCHC 29.4 (L) 30.0 - 36.5 g/dL    RDW 14.4 (H) 81.8 - 14.5 %    Platelets 203 150 - 400 K/uL    MPV 8.5 (L) 8.9 - 12.9  FL    Nucleated RBCs 0.0 0.0 PER 100 WBC    nRBC 0.00 0.00 - 0.01 K/uL    Neutrophils % 62 32 - 75 %    Lymphocytes % 24 12 - 49 %    Monocytes % 10 5 - 13 %    Eosinophils % 2 0 - 7 %    Basophils % 1 0 - 1 %    Immature Granulocytes 1 (H) 0 - 0.5 %    Neutrophils Absolute 3.8 1.8 - 8.0 K/UL    Lymphocytes Absolute 1.5 0.8 - 3.5 K/UL    Monocytes Absolute 0.6 0.0 - 1.0 K/UL    Eosinophils Absolute 0.1 0.0 - 0.4 K/UL    Basophils Absolute 0.0 0.0 - 0.1 K/UL    Absolute Immature Granulocyte 0.0 0.00 - 0.04 K/UL    Differential Type AUTOMATED     POCT Glucose    Collection Time: 01/28/22 11:48 AM   Result Value Ref Range    POC Glucose 104 (H) 65 - 100 mg/dL    Performed by: Haydee Monica    POCT Glucose    Collection Time: 01/28/22  4:03 PM   Result Value Ref Range    POC Glucose 105 (H) 65 - 100 mg/dL    Performed by: Hardie Pulley    POCT Glucose    Collection Time: 01/28/22  4:27 PM   Result Value Ref Range    POC Glucose 113 (H) 65 - 100 mg/dL    Performed by: Jarome Lamas NEREIDA    POCT Glucose     Collection Time: 01/28/22  7:13 PM   Result Value Ref Range    POC Glucose 125 (H) 65 - 100 mg/dL    Performed by: Bonna Gains    Basic Metabolic Panel    Collection Time: 01/29/22  8:23 AM   Result Value Ref Range    Sodium 136 136 - 145 mmol/L    Potassium 3.8 3.5 - 5.1 mmol/L    Chloride 107 97 - 108 mmol/L    CO2 27 21 - 32 mmol/L    Anion Gap 2 (L) 5 - 15 mmol/L    Glucose 88 65 - 100 mg/dL    BUN 6 6 - 20 mg/dL    Creatinine 1.61 (L) 0.70 - 1.30 mg/dL    Bun/Cre Ratio 13 12 - 20      Est, Glom Filt Rate >60 >60 ml/min/1.27m2    Calcium 7.5 (L) 8.5 - 10.1 mg/dL   CBC with Auto Differential    Collection Time: 01/29/22  8:23 AM   Result Value Ref Range    WBC 6.3 4.1 - 11.1 K/uL    RBC 3.58 (L) 4.10 - 5.70 M/uL    Hemoglobin 8.8 (L) 12.1 - 17.0 g/dL    Hematocrit 09.6 (L) 36.6 - 50.3 %    MCV 83.5 80.0 - 99.0 FL    MCH 24.6 (L) 26.0 - 34.0 PG    MCHC 29.4 (L) 30.0 - 36.5 g/dL    RDW 04.5 (H) 40.9 - 14.5 %    Platelets 224 150 - 400 K/uL    MPV 8.4 (L) 8.9 - 12.9 FL    Nucleated RBCs 0.0 0.0 PER 100 WBC    nRBC 0.00 0.00 - 0.01 K/uL    Neutrophils % 57 32 - 75 %    Lymphocytes % 31 12 - 49 %    Monocytes % 8 5 - 13 %    Eosinophils %  2 0 - 7 %    Basophils % 1 0 - 1 %    Immature Granulocytes 1 (H) 0 - 0.5 %    Neutrophils Absolute 3.7 1.8 - 8.0 K/UL    Lymphocytes Absolute 2.0 0.8 - 3.5 K/UL    Monocytes Absolute 0.5 0.0 - 1.0 K/UL    Eosinophils Absolute 0.1 0.0 - 0.4 K/UL    Basophils Absolute 0.0 0.0 - 0.1 K/UL    Absolute Immature Granulocyte 0.1 (H) 0.00 - 0.04 K/UL    Differential Type AUTOMATED     POCT Glucose    Collection Time: 01/29/22  9:37 AM   Result Value Ref Range    POC Glucose 114 (H) 65 - 100 mg/dL    Performed by: Margretta Dittyaylor Sarrah        CT CERVICAL SPINE WO CONTRAST   Final Result   1. No acute fracture or subluxation.   2. Incompletely visualized right pleural effusion.          CT HEAD WO CONTRAST   Final Result      No acute traumatic injury.             Intake and Output:  Current  Shift: No intake/output data recorded.  Last three shifts: 05/31 1901 - 06/02 0700  In: -   Out: 200 [Urine:200]      Lab/Data Review:  Recent Labs     01/26/22  2303 01/28/22  1055 01/29/22  0823   WBC 10.7 6.1 6.3   HGB 8.6* 7.8* 8.8*   HCT 29.9* 26.5* 29.9*   PLT 210 203 224     Recent Labs     01/26/22  2303 01/28/22  0601 01/28/22  1055 01/29/22  0823   NA 136 138 137 136   K 3.6 3.4* 3.5 3.8   CL 104 107 106 107   CO2 28 29 27 27    GLUCOSE 109* 90 103* 88   BUN 10 9 8 6    CREATININE 0.62* 0.50* 0.48* 0.47*   CALCIUM 7.7* 7.6* 7.4* 7.5*   LABALBU 1.8*  --   --   --    BILITOT 0.2  --   --   --    AST 17  --   --   --    ALT <6*  --   --   --      No results for input(s): PHART, PCO2ART, PO2ART, HCO3ART, BEART, TCO2ARRT, HGBART, PO2CORART, FIO2A, O2SATART, OXYHEM, CARBOXHGBART, METHGBART, O2CONTART, PHCORART, TEMP in the last 72 hours.    Invalid input(s): PCO2COART  Recent Results (from the past 24 hour(s))   Basic Metabolic Panel    Collection Time: 01/28/22 10:55 AM   Result Value Ref Range    Sodium 137 136 - 145 mmol/L    Potassium 3.5 3.5 - 5.1 mmol/L    Chloride 106 97 - 108 mmol/L    CO2 27 21 - 32 mmol/L    Anion Gap 4 (L) 5 - 15 mmol/L    Glucose 103 (H) 65 - 100 mg/dL    BUN 8 6 - 20 mg/dL    Creatinine 0.860.48 (L) 0.70 - 1.30 mg/dL    Bun/Cre Ratio 17 12 - 20      Est, Glom Filt Rate >60 >60 ml/min/1.7373m2    Calcium 7.4 (L) 8.5 - 10.1 mg/dL   CBC with Auto Differential    Collection Time: 01/28/22 10:55 AM   Result Value Ref Range  WBC 6.1 4.1 - 11.1 K/uL    RBC 3.17 (L) 4.10 - 5.70 M/uL    Hemoglobin 7.8 (L) 12.1 - 17.0 g/dL    Hematocrit 62.0 (L) 36.6 - 50.3 %    MCV 83.6 80.0 - 99.0 FL    MCH 24.6 (L) 26.0 - 34.0 PG    MCHC 29.4 (L) 30.0 - 36.5 g/dL    RDW 35.5 (H) 97.4 - 14.5 %    Platelets 203 150 - 400 K/uL    MPV 8.5 (L) 8.9 - 12.9 FL    Nucleated RBCs 0.0 0.0 PER 100 WBC    nRBC 0.00 0.00 - 0.01 K/uL    Neutrophils % 62 32 - 75 %    Lymphocytes % 24 12 - 49 %    Monocytes % 10 5 - 13 %     Eosinophils % 2 0 - 7 %    Basophils % 1 0 - 1 %    Immature Granulocytes 1 (H) 0 - 0.5 %    Neutrophils Absolute 3.8 1.8 - 8.0 K/UL    Lymphocytes Absolute 1.5 0.8 - 3.5 K/UL    Monocytes Absolute 0.6 0.0 - 1.0 K/UL    Eosinophils Absolute 0.1 0.0 - 0.4 K/UL    Basophils Absolute 0.0 0.0 - 0.1 K/UL    Absolute Immature Granulocyte 0.0 0.00 - 0.04 K/UL    Differential Type AUTOMATED     POCT Glucose    Collection Time: 01/28/22 11:48 AM   Result Value Ref Range    POC Glucose 104 (H) 65 - 100 mg/dL    Performed by: Haydee Monica    POCT Glucose    Collection Time: 01/28/22  4:03 PM   Result Value Ref Range    POC Glucose 105 (H) 65 - 100 mg/dL    Performed by: Hardie Pulley    POCT Glucose    Collection Time: 01/28/22  4:27 PM   Result Value Ref Range    POC Glucose 113 (H) 65 - 100 mg/dL    Performed by: Jarome Lamas NEREIDA    POCT Glucose    Collection Time: 01/28/22  7:13 PM   Result Value Ref Range    POC Glucose 125 (H) 65 - 100 mg/dL    Performed by: Bonna Gains    Basic Metabolic Panel    Collection Time: 01/29/22  8:23 AM   Result Value Ref Range    Sodium 136 136 - 145 mmol/L    Potassium 3.8 3.5 - 5.1 mmol/L    Chloride 107 97 - 108 mmol/L    CO2 27 21 - 32 mmol/L    Anion Gap 2 (L) 5 - 15 mmol/L    Glucose 88 65 - 100 mg/dL    BUN 6 6 - 20 mg/dL    Creatinine 1.63 (L) 0.70 - 1.30 mg/dL    Bun/Cre Ratio 13 12 - 20      Est, Glom Filt Rate >60 >60 ml/min/1.61m2    Calcium 7.5 (L) 8.5 - 10.1 mg/dL   CBC with Auto Differential    Collection Time: 01/29/22  8:23 AM   Result Value Ref Range    WBC 6.3 4.1 - 11.1 K/uL    RBC 3.58 (L) 4.10 - 5.70 M/uL    Hemoglobin 8.8 (L) 12.1 - 17.0 g/dL    Hematocrit 84.5 (L) 36.6 - 50.3 %    MCV 83.5 80.0 - 99.0 FL    MCH 24.6 (L) 26.0 - 34.0 PG  MCHC 29.4 (L) 30.0 - 36.5 g/dL    RDW 16.1 (H) 09.6 - 14.5 %    Platelets 224 150 - 400 K/uL    MPV 8.4 (L) 8.9 - 12.9 FL    Nucleated RBCs 0.0 0.0 PER 100 WBC    nRBC 0.00 0.00 - 0.01 K/uL    Neutrophils % 57 32 - 75 %    Lymphocytes %  31 12 - 49 %    Monocytes % 8 5 - 13 %    Eosinophils % 2 0 - 7 %    Basophils % 1 0 - 1 %    Immature Granulocytes 1 (H) 0 - 0.5 %    Neutrophils Absolute 3.7 1.8 - 8.0 K/UL    Lymphocytes Absolute 2.0 0.8 - 3.5 K/UL    Monocytes Absolute 0.5 0.0 - 1.0 K/UL    Eosinophils Absolute 0.1 0.0 - 0.4 K/UL    Basophils Absolute 0.0 0.0 - 0.1 K/UL    Absolute Immature Granulocyte 0.1 (H) 0.00 - 0.04 K/UL    Differential Type AUTOMATED     POCT Glucose    Collection Time: 01/29/22  9:37 AM   Result Value Ref Range    POC Glucose 114 (H) 65 - 100 mg/dL    Performed by: Margretta Ditty            ______________________________________________________________________________    Rebekah Chesterfield, APRN - NP    This is dictation was done by dragon, computer voice recognition software.  Quite often unanticipated grammatical, syntax, homophones and other interpretive errors or inadvertently transcribed by the computer software.  Please excuse errors that have escaped final proofreading.  Thank you.

## 2022-01-29 NOTE — Care Coordination-Inpatient (Addendum)
CM spoke with son via telephone to discuss care plan for discharge. Went over recommendations for SNF from therapy. Per son patient has had bad experience with SNF in past and is unsure about going to SNF. Per son has been having home health at house but has concerns due to increasing weakness. Son also has some questions for therapist and attending.    Reached out to therapist and attending to contact son. Will follow back up with son after he has spoken with above.     1505: followed back up with son. They are in agreement for discharge to [facility]. Choice given for [facility]. Referral sent via [facility] health.

## 2022-01-29 NOTE — Plan of Care (Signed)
Problem: Skin/Tissue Integrity  Goal: Absence of new skin breakdown  Description: 1.  Monitor for areas of redness and/or skin breakdown  2.  Assess vascular access sites hourly  3.  Every 4-6 hours minimum:  Change oxygen saturation probe site  4.  Every 4-6 hours:  If on nasal continuous positive airway pressure, respiratory therapy assess nares and determine need for appliance change or resting period.  Outcome: Progressing     Problem: Safety - Adult  Goal: Free from fall injury  Outcome: Progressing     Problem: ABCDS Injury Assessment  Goal: Absence of physical injury  Outcome: Progressing     Problem: Chronic Conditions and Co-morbidities  Goal: Patient's chronic conditions and co-morbidity symptoms are monitored and maintained or improved  Outcome: Progressing     Problem: Physical Therapy - Adult  Goal: By Discharge: Performs mobility at highest level of function for planned discharge setting.  See evaluation for individualized goals.  Description: FUNCTIONAL STATUS PRIOR TO ADMISSION: The patient was functional at the wheelchair level and required maximum assistancefor transfers to the chair.    HOME SUPPORT PRIOR TO ADMISSION: The patient lived with son and required moderate assistance for ADLs.    Physical Therapy Goals  Initiated 01/28/2022  Pt stated goal: go home  Pt will be I with [facility] in 7 days.  Pt will perform bed mobility with Minimal Assist in 7 days.  Pt will perform transfers with Minimal Assist in 7 days.   Pt will amb 10 feet with LRAD safely with Moderate Assist in 7 days.   Pt will verbalize and demonstrate compliance with hip precautions to include no hip flex/IR/ADD in 7 days.   Pt will demonstrate improvement in standing balance from fair  to fair + in 7 days.    01/29/2022 1549 by Zenovia Jordan, PTA  Outcome: Progressing     Problem: Occupational Therapy - Adult  Goal: By Discharge: Performs self-care activities at highest level of function for planned discharge setting.  See  evaluation for individualized goals.  Description: FUNCTIONAL STATUS PRIOR TO ADMISSION: Pt reports since previous admission following hip sx, he has not been ambulation and been mostly in bed. His son A him w/ ADLs and transfers to [facility].    HOME SUPPORT: Pt lives with son.    Occupational Therapy Goals:  Initiated 01/28/2022  Patient/Family stated goal: I want to get better  Patient will perform grooming in standing  with min  A within 7 day(s).  Patient will perform UB bathing with [employer] within 7 day(s).  Patient will perform LB bathing with Minimal Assist within 7 day(s).  Patient will perform toilet transfers with Minimal Assist  within 7 day(s).  Patient will perform all aspects of toileting with IND within 7 day(s).  Patient will participate in upper extremity therapeutic exercise/activities with Independence within 7 day(s).    01/29/2022 1029 by Arthor Captain, OTA  Outcome: Progressing

## 2022-01-29 NOTE — Plan of Care (Signed)
OCCUPATIONAL THERAPY TREATMENT  Patient: Carlos Petersen (71 y.o. male)  Date: 01/29/2022  Primary Diagnosis: Confusion [R41.0]  Diarrhea, unspecified type [R19.7]  AMS (altered mental status) [R41.82]       Precautions: Weight Bearing, General Precautions, Bed Alarm Right Lower Extremity Weight Bearing: Weight Bearing As Tolerated              In place during session: External Catheter  Chart, occupational therapy assessment, plan of care, and goals were reviewed.    ASSESSMENT  Patient continues with skilled OT services and is progressing towards goals. Pt semi supine in bed upon OT arrival, agreeable to session. Pt A&O x 4. (See below for objective details and assist levels).     Overall pt tolerated session fair today with completion of bed mobility, transfers, and ADLs.  Pt completed bed mobility with MinA and additional time overall and completed transfer from EOB with ModA x2.  Pt completed transfer to recliner this date and able to take steps with less assist.  Pt completed seated grooming with setup A at EOB.  Pt requires cueing for foot placement during standing and for sequencing of steps.  Cueing and assist provided with RW management throughout ambulation. Will continue to benefit from skilled OT services, and will continue to progress as tolerated.     Other factors to consider for discharge: high risk for falls, not safe to be alone, and concern for safely navigating or managing the home environment        PLAN :  Patient continues to benefit from skilled intervention to address the above impairments.  Continue treatment per established plan of care to address goals.    Recommend with staff: Out of bed to chair for meals and Encourage HEP in prep for ADLs/mobility    Recommend next OT session: Toileting, LB dressing, and Seated grooming    Recommendation for discharge: (in order for the patient to meet his/her long term goals): Skilled Nursing Facility    IF patient discharges home will need the  following DME: TBD       SUBJECTIVE:   Patient stated "I haven't been able to do that in a while."    OBJECTIVE DATA SUMMARY:   Cognitive/Behavioral Status:  Orientation  Orientation Level: Oriented X4  Cognition  Overall Cognitive Status: WNL    Functional Mobility and Transfers for ADLs:  Bed Mobility:  Bed Mobility Training  Rolling: Minimum assistance;Assist X1;Additional time  Supine to Sit: Minimum assistance;Assist X1;Additional time  Scooting: Minimum assistance;Additional time    Transfers:  Transfer Training  Sit to Stand: Moderate assistance;Assist X2  Stand to Sit: Moderate assistance;Assist X1    Balance:  Balance  Sitting: Intact  Standing: Impaired  Standing - Static: Fair;Constant support  Standing - Dynamic: Poor;Constant support    ADL Intervention:  Grooming: Setup   Grooming Skilled Clinical Factors: seated face washing at EOB    Pain Rating:  0/10     Activity Tolerance:   Fair  and requires rest breaks    After treatment patient left in no apparent distress:   Bed locked and returned to lowest position, Patient left in no apparent distress sitting up in chair and Call bell within reach, and nsg updated     COMMUNICATION/EDUCATION:   The patient's plan of care was discussed with: Physical therapy assistant and Registered nurse    Patient Education  Education Provided: Role of Therapy;Transfer Training;Energy Conservation;Fall Prevention Strategies  Education Method: Demonstration;Verbal  Education Outcome: Demonstrated  understanding;Verbalized understanding    PT/OT sessions occurred together for increased safety of pt and clinician.    Thank you for this referral.  Zac Torti, OTA  Minutes: 41    Problem: Occupational Therapy - Adult  Goal: By Discharge: Performs self-care activities at highest level of function for planned discharge setting.  See evaluation for individualized goals.  Description: FUNCTIONAL STATUS PRIOR TO ADMISSION: Pt reports since previous admission following hip  sx, he has not been ambulation and been mostly in bed. His son A him w/ ADLs and transfers to [facility].    HOME SUPPORT: Pt lives with son.    Occupational Therapy Goals:  Initiated 01/28/2022  Patient/Family stated goal: I want to get better  Patient will perform grooming in standing  with min  A within 7 day(s).  Patient will perform UB bathing with Independence within 7 day(s).  Patient will perform LB bathing with Minimal Assist within 7 day(s).  Patient will perform toilet transfers with Minimal Assist  within 7 day(s).  Patient will perform all aspects of toileting with IND within 7 day(s).  Patient will participate in upper extremity therapeutic exercise/activities with Independence within 7 day(s).    Outcome: Progressing

## 2022-01-29 NOTE — Progress Notes (Signed)
Patient states that he does not want his blood glucose checked via finger sticks; states that he is not a diabetic

## 2022-01-30 LAB — CBC WITH AUTO DIFFERENTIAL
Absolute Immature Granulocyte: 0 10*3/uL (ref 0.00–0.04)
Basophils %: 1 % (ref 0–1)
Basophils Absolute: 0 10*3/uL (ref 0.0–0.1)
Eosinophils %: 2 % (ref 0–7)
Eosinophils Absolute: 0.1 10*3/uL (ref 0.0–0.4)
Hematocrit: 29.1 % — ABNORMAL LOW (ref 36.6–50.3)
Hemoglobin: 8.6 g/dL — ABNORMAL LOW (ref 12.1–17.0)
Immature Granulocytes: 1 % — ABNORMAL HIGH (ref 0–0.5)
Lymphocytes %: 32 % (ref 12–49)
Lymphocytes Absolute: 1.8 10*3/uL (ref 0.8–3.5)
MCH: 24.5 PG — ABNORMAL LOW (ref 26.0–34.0)
MCHC: 29.6 g/dL — ABNORMAL LOW (ref 30.0–36.5)
MCV: 82.9 FL (ref 80.0–99.0)
MPV: 8.1 FL — ABNORMAL LOW (ref 8.9–12.9)
Monocytes %: 9 % (ref 5–13)
Monocytes Absolute: 0.5 10*3/uL (ref 0.0–1.0)
Neutrophils %: 55 % (ref 32–75)
Neutrophils Absolute: 3 10*3/uL (ref 1.8–8.0)
Nucleated RBCs: 0 PER 100 WBC
Platelets: 207 10*3/uL (ref 150–400)
RBC: 3.51 M/uL — ABNORMAL LOW (ref 4.10–5.70)
RDW: 20.5 % — ABNORMAL HIGH (ref 11.5–14.5)
WBC: 5.5 10*3/uL (ref 4.1–11.1)
nRBC: 0 10*3/uL (ref 0.00–0.01)

## 2022-01-30 LAB — BASIC METABOLIC PANEL
Anion Gap: 5 mmol/L (ref 5–15)
BUN: 5 mg/dL — ABNORMAL LOW (ref 6–20)
Bun/Cre Ratio: 10 — ABNORMAL LOW (ref 12–20)
CO2: 24 mmol/L (ref 21–32)
Calcium: 7.6 mg/dL — ABNORMAL LOW (ref 8.5–10.1)
Chloride: 109 mmol/L — ABNORMAL HIGH (ref 97–108)
Creatinine: 0.52 mg/dL — ABNORMAL LOW (ref 0.70–1.30)
Est, Glom Filt Rate: >60 mL/min/{1.73_m2} (ref >60)
Glucose: 105 mg/dL — ABNORMAL HIGH (ref 65–100)
Potassium: 3.5 mmol/L (ref 3.5–5.1)
Sodium: 138 mmol/L (ref 136–145)

## 2022-01-30 MED FILL — FERROUS SULFATE 325 (65 FE) MG PO TABS: 325 (65 Fe) MG | ORAL | Qty: 1

## 2022-01-30 MED FILL — SYMBICORT 160-4.5 MCG/ACT IN AERO: 160-4.5 MCG/ACT | RESPIRATORY_TRACT | Qty: 6

## 2022-01-30 MED FILL — ACIDOPHILUS/CITRUS PECTIN PO TABS: ORAL | Qty: 1

## 2022-01-30 NOTE — Care Coordination-Inpatient (Signed)
CM noted discharge order.  If patient is medically clear, he is still pending acceptance and authorization at a skilled nursing facility.    Due to needing authorization, patient will likely not get auth through insurance over the weekend.    CM will continue to follow.

## 2022-01-30 NOTE — Progress Notes (Cosign Needed)
Hospitalist Progress Note        Daily Progress Note: 01/30/2022 11:10 AM  Hospital Course     Carlos DrossDavid Petersen is a 71 y.o.  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  Proximately 1 month ago he underwent ORIF of left hip fracture.  He has done well in terms of recovery except for generalized weakness that is actually gotten worse in the last 1 to 2 weeks.  There is no focal weakness other than he describes some right arm weakness that is chronic and not acute.  He has has chronic loose stools but does admit that his diarrhea has been worse recently.  P.o. intake has been diminished for several weeks but no abdominal pain or blood in stools.  No fevers or chills.  Of note he was recently treated for urinary tract infection with a cephalosporin.  Currently no dysuria.  Regarding his episode of confusion that occurred yesterday he says he just got a large period time.  No loss of consciousness.  No associated headache visual changes or focal weakness or paresthesias at that time.  In talking with his son and patient it appears that his mental status is now improved and that is currently at baseline.  Due to these complaints his orthopedic suggested he present to the emergency room.  He was found to be afebrile and hemodynamically stable.  Head CT without acute process.  Labs showed normal white count and essentially normal electrolytes.  He does have chronic anemia with hemoglobin of 8.6.  Noted albumin is 1.8.  C. difficile testing was positive.  Patient placed in observation for further work-up and management.     6/2-6/3 continues on oral vancomycin and supportive therapy for C. difficile.  PT OT recommending SNF placement.    Subjective:         Patient seen and examined at bedside.  He reports improvement in diarrhea.  States he did not want to get up in chair at  bedside with PT/OT yesterday because "it's not comfortable."   Also states that he is very weak at home. Cannot transfer from bed to bedside commode without assistance from his son.       REVIEW OF SYSTEMS    Constitutional: Positive for fatigue. Negative for chills, fever.   HENT:  Negative for congestion and sore throat.    Eyes:  Negative for blurred vision and double vision.   Respiratory:  Negative for cough, sputum production, shortness of breath and wheezing.    Cardiovascular:  Negative for chest pain, palpitations, orthopnea and leg swelling.   Gastrointestinal:  Positive for abdominal distention, abdominal pain and diarrhea. Negative for blood in stool, constipation, nausea and vomiting.   Genitourinary:  Negative for dysuria, flank pain, frequency, hematuria and urgency.   Musculoskeletal: Positive for arthralgias. Negative for back pain and neck pain.   Skin:  Negative for itching and rash.   Neurological:  Negative for dizziness, speech change, focal weakness, seizures and headaches.   Psychiatric/Behavioral:  Negative for depression. The patient is not nervous/anxious.      Objective:     Current Facility-Administered Medications   Medication Dose Route Frequency    ferrous sulfate (IRON 325) tablet 325 mg  325 mg Oral BID WC    acidophilus/citrus pectin 1 tablet  1 tablet Oral BID    albuterol sulfate HFA (PROVENTIL;VENTOLIN;PROAIR) 108 (90 Base)  MCG/ACT inhaler 2 puff  2 puff Inhalation Q6H PRN    amiodarone (CORDARONE) tablet 200 mg  200 mg Oral Daily    apixaban (ELIQUIS) tablet 5 mg  5 mg Oral BID    carbidopa-levodopa (SINEMET CR) 25-100 MG per extended release tablet 4 tablet  4 tablet Oral TID    pantoprazole (PROTONIX) tablet 40 mg  40 mg Oral QAM AC    ranolazine (RANEXA) extended release tablet 500 mg  500 mg Oral BID    rosuvastatin (CRESTOR) tablet 20 mg  20 mg Oral Nightly    insulin lispro (HUMALOG) injection vial 0-4 Units  0-4 Units SubCUTAneous TID WC    insulin lispro (HUMALOG)  injection vial 0-4 Units  0-4 Units SubCUTAneous Nightly    glucose chewable tablet 16 g  4 tablet Oral PRN    dextrose bolus 10% 125 mL  125 mL IntraVENous PRN    Or    dextrose bolus 10% 250 mL  250 mL IntraVENous PRN    glucagon injection 1 mg  1 mg SubCUTAneous PRN    dextrose 10 % infusion   IntraVENous Continuous PRN    budesonide-formoterol (SYMBICORT) 160-4.5 MCG/ACT inhaler 2 puff  2 puff Inhalation BID    And    tiotropium (SPIRIVA RESPIMAT) 2.5 MCG/ACT inhaler 2 puff  2 puff Inhalation Daily    vancomycin (VANCOCIN) capsule 125 mg  125 mg Oral 4x Daily       BP 114/60   Pulse 83   Temp 98.4 F (36.9 C) (Oral)   Resp 16   Ht 1.803 m (5' 10.98")   Wt 95.3 kg (210 lb)   SpO2 98%   BMI 29.30 kg/m         Temp (24hrs), Avg:97.8 F (36.6 C), Min:97.5 F (36.4 C), Max:98.4 F (36.9 C)      No intake/output data recorded.  06/01 1901 - 06/03 0700  In: -   Out: 900 [Urine:900]    PHYSICAL EXAM:    Constitutional: No acute distress  Skin: Extremities and face reveal no rashes.   HEENT: Sclerae anicteric. PERRL. No oral ulcers. The neck is supple and no masses.   Cardiovascular: Regular rate and rhythm. +S1/S2. No murmur or gallop. No JVD.   Respiratory:  Clear breath sounds bilaterally with no wheezes, rales, or rhonchi.   GI: Abdomen nondistended, soft, and nontender. Normal active bowel sounds.  Rectal: Deferred   Musculoskeletal: No pitting edema of the lower legs. Able to move all ext  Neurological:  Patient is A&Ox3. Cranial nerves II-XII grossly intact  Psychiatric: Mood and affect appropriate    Data Review    No results found for this or any previous visit (from the past 24 hour(s)).    CT CERVICAL SPINE WO CONTRAST   Final Result   1. No acute fracture or subluxation.   2. Incompletely visualized right pleural effusion.          CT HEAD WO CONTRAST   Final Result      No acute traumatic injury.             Principal Problem:    AMS (altered mental status)  Active Problems:    Parkinson's  disease (HCC)    C. difficile colitis    Diarrhea    Generalized weakness    Iron deficiency anemia  Resolved Problems:    * No resolved hospital problems. *      Assessment/Plan:     1. C  difficile colitis   - Suspected recent antibiotic use.  He does have chronic diarrhea but certainly worse recently. Appeared septic on admission but no abdominal tenderness.   - Tolerating p.o. well   - 10-day course of vancomycin.  Try to limit any antibiotics.    - Add Florastor probiotic     2. Metabolic encephalopathy   - Transient episode that sounds more like encephalopathy rather than acute stroke.  He did have a head CT that showed no acute process.  - MRI due to pacemaker and likely consultation for stroke is low.  We will monitor his clinical exam  -  Very low suspicion for stroke. At baseline mentation. He does have hx Parkinson.  - [facility] telemetry neuro consulted, pending since 05/31     3. Parkinson's disease   - Diagnosed many years ago and just on a low-dose Sinemet.  Has not had any recent follow-up with neurology in the last few years. His failure to thrive may be due to Parkinson's disease.  - Therapy evaluated him watching for evidence of freezing with mobility. - Continue Sinemet.     4. NIDDM2 complicated by HLD   - On Jardiance at home.  Given decreased intake and diarrhea we will hold oral agents and just cover with sliding scale for now     5. HLD -continue statin     6. Hx PE   - Given the left hip x-ray increased risk for recurrent DVT will continue with Eliquis. He does have anemia at least since his surgery but is actually improved and no evidence of overt bleeding.  Check iron studies.     7. COPD/asthma  - Ongoing tobacco use  - No indication for systemic steroids.  Continue Trelegy     8. Recent left hop fracture s/p ORIF - Incision is well approximated without any overt signs of infection.  He is weightbearing as tolerated.     9. Severe protein calorie malnutrition   - Albumin 1.8.  Now exacerbated  by C. difficile infection.  He is tolerating p.o.  Nutrition consult add supplements especially to improve protein stores.     10. Iron deficiency anemia   - Exacerbated by recent surgery and a component of nutritional deficiencies. - - Continue oral iron replacement therapy     11. Generalized weakness   - PT OT consulted recommending SNF      DVT Prophylaxis: on Eliquis   Code Status: No Order DNR patient?  POA/NOKShari Heritage, [phone]    Social determinants of health: None    Disposition and discharge barriers:  Tele neuro consult pending since 05/31, awaiting neuro clearance     Care Plan discussed with: patient and nursing   _____________________________________________________________________________  Time spent in direct care including coordination of service, review of data and examination: > 35 minutes    ______________________________________________________________________________    Carmon Sails, PA-C

## 2022-01-31 LAB — BASIC METABOLIC PANEL
Anion Gap: 5 mmol/L (ref 5–15)
BUN: 5 mg/dL — ABNORMAL LOW (ref 6–20)
Bun/Cre Ratio: 10 — ABNORMAL LOW (ref 12–20)
CO2: 25 mmol/L (ref 21–32)
Calcium: 7.6 mg/dL — ABNORMAL LOW (ref 8.5–10.1)
Chloride: 111 mmol/L — ABNORMAL HIGH (ref 97–108)
Creatinine: 0.51 mg/dL — ABNORMAL LOW (ref 0.70–1.30)
Est, Glom Filt Rate: >60 mL/min/{1.73_m2} (ref >60)
Glucose: 126 mg/dL — ABNORMAL HIGH (ref 65–100)
Potassium: 3.4 mmol/L — ABNORMAL LOW (ref 3.5–5.1)
Sodium: 141 mmol/L (ref 136–145)

## 2022-01-31 LAB — CBC WITH AUTO DIFFERENTIAL
Absolute Immature Granulocyte: 0.1 10*3/uL — ABNORMAL HIGH (ref 0.00–0.04)
Basophils %: 1 % (ref 0–1)
Basophils Absolute: 0 10*3/uL (ref 0.0–0.1)
Eosinophils %: 1 % (ref 0–7)
Eosinophils Absolute: 0.1 10*3/uL (ref 0.0–0.4)
Hematocrit: 30.9 % — ABNORMAL LOW (ref 36.6–50.3)
Hemoglobin: 9 g/dL — ABNORMAL LOW (ref 12.1–17.0)
Immature Granulocytes: 1 % — ABNORMAL HIGH (ref 0–0.5)
Lymphocytes %: 31 % (ref 12–49)
Lymphocytes Absolute: 1.9 10*3/uL (ref 0.8–3.5)
MCH: 24.5 PG — ABNORMAL LOW (ref 26.0–34.0)
MCHC: 29.1 g/dL — ABNORMAL LOW (ref 30.0–36.5)
MCV: 84 FL (ref 80.0–99.0)
MPV: 8.3 FL — ABNORMAL LOW (ref 8.9–12.9)
Monocytes %: 10 % (ref 5–13)
Monocytes Absolute: 0.6 10*3/uL (ref 0.0–1.0)
Neutrophils %: 56 % (ref 32–75)
Neutrophils Absolute: 3.4 10*3/uL (ref 1.8–8.0)
Nucleated RBCs: 0 PER 100 WBC
Platelets: 218 10*3/uL (ref 150–400)
RBC: 3.68 M/uL — ABNORMAL LOW (ref 4.10–5.70)
RDW: 20.5 % — ABNORMAL HIGH (ref 11.5–14.5)
WBC: 6.1 10*3/uL (ref 4.1–11.1)
nRBC: 0 10*3/uL (ref 0.00–0.01)

## 2022-01-31 LAB — POCT GLUCOSE
POC Glucose: 103 mg/dL — ABNORMAL HIGH (ref 65–100)
POC Glucose: 129 mg/dL — ABNORMAL HIGH (ref 65–100)

## 2022-01-31 MED FILL — FERROUS SULFATE 325 (65 FE) MG PO TABS: 325 (65 Fe) MG | ORAL | Qty: 1

## 2022-01-31 MED FILL — SYMBICORT 160-4.5 MCG/ACT IN AERO: 160-4.5 MCG/ACT | RESPIRATORY_TRACT | Qty: 6

## 2022-01-31 MED FILL — ACIDOPHILUS/CITRUS PECTIN PO TABS: ORAL | Qty: 1

## 2022-01-31 NOTE — Plan of Care (Signed)
Problem: Discharge Planning  Goal: Discharge to home or other facility with appropriate resources  Outcome: Progressing  Flowsheets (Taken 01/31/2022 2045)  Discharge to home or other facility with appropriate resources: Identify barriers to discharge with patient and caregiver     Problem: Skin/Tissue Integrity  Goal: Absence of new skin breakdown  Description: 1.  Monitor for areas of redness and/or skin breakdown  2.  Assess vascular access sites hourly  3.  Every 4-6 hours minimum:  Change oxygen saturation probe site  4.  Every 4-6 hours:  If on nasal continuous positive airway pressure, respiratory therapy assess nares and determine need for appliance change or resting period.  Outcome: Progressing     Problem: Safety - Adult  Goal: Free from fall injury  Outcome: Progressing     Problem: ABCDS Injury Assessment  Goal: Absence of physical injury  Outcome: Progressing     Problem: Physical Therapy - Adult  Goal: By Discharge: Performs mobility at highest level of function for planned discharge setting.  See evaluation for individualized goals.  Description: FUNCTIONAL STATUS PRIOR TO ADMISSION: The patient was functional at the wheelchair level and required maximum assistancefor transfers to the chair.    HOME SUPPORT PRIOR TO ADMISSION: The patient lived with son and required moderate assistance for ADLs.    Physical Therapy Goals  Initiated 01/28/2022  Pt stated goal: go home  Pt will be I with LE HEP in 7 days.  Pt will perform bed mobility with Minimal Assist in 7 days.  Pt will perform transfers with Minimal Assist in 7 days.   Pt will amb 10 feet with LRAD safely with Moderate Assist in 7 days.   Pt will verbalize and demonstrate compliance with hip precautions to include no hip flex/IR/ADD in 7 days.   Pt will demonstrate improvement in standing balance from fair  to fair + in 7 days.    01/31/2022 0957 by Tanna Furry, PTA  Outcome: Progressing     Problem: Nutrition Deficit:  Goal: Optimize nutritional  status  Outcome: Progressing     Problem: Occupational Therapy - Adult  Goal: By Discharge: Performs self-care activities at highest level of function for planned discharge setting.  See evaluation for individualized goals.  Description: FUNCTIONAL STATUS PRIOR TO ADMISSION: Pt reports since previous admission following hip sx, he has not been ambulation and been mostly in bed. His son A him w/ ADLs and transfers to [facility].    HOME SUPPORT: Pt lives with son.    Occupational Therapy Goals:  Initiated 01/28/2022  Patient/Family stated goal: I want to get better  Patient will perform grooming in standing  with min  A within 7 day(s).  Patient will perform UB bathing with Independence within 7 day(s).  Patient will perform LB bathing with Minimal Assist within 7 day(s).  Patient will perform toilet transfers with Minimal Assist  within 7 day(s).  Patient will perform all aspects of toileting with IND within 7 day(s).  Patient will participate in upper extremity therapeutic exercise/activities with Independence within 7 day(s).    01/31/2022 0956 by Randal Buba, OTA  Outcome: Progressing

## 2022-01-31 NOTE — Plan of Care (Signed)
Problem: Discharge Planning  Goal: Discharge to home or other facility with appropriate resources  Outcome: Progressing     Problem: Skin/Tissue Integrity  Goal: Absence of new skin breakdown  Description: 1.  Monitor for areas of redness and/or skin breakdown  2.  Assess vascular access sites hourly  3.  Every 4-6 hours minimum:  Change oxygen saturation probe site  4.  Every 4-6 hours:  If on nasal continuous positive airway pressure, respiratory therapy assess nares and determine need for appliance change or resting period.  Outcome: Progressing     Problem: Safety - Adult  Goal: Free from fall injury  Outcome: Progressing     Problem: ABCDS Injury Assessment  Goal: Absence of physical injury  Outcome: Progressing     Problem: Chronic Conditions and Co-morbidities  Goal: Patient's chronic conditions and co-morbidity symptoms are monitored and maintained or improved  Outcome: Progressing     Problem: Nutrition Deficit:  Goal: Optimize nutritional status  Outcome: Progressing

## 2022-01-31 NOTE — Care Coordination-Inpatient (Signed)
DC order noted.  Pt is pending admit to [facility].  To date acceptance is not confirmed.  Do not expect insurance auth to be obtained by that facility before Monday 02/01/2022.

## 2022-01-31 NOTE — Plan of Care (Signed)
Problem: Occupational Therapy - Adult  Goal: By Discharge: Performs self-care activities at highest level of function for planned discharge setting.  See evaluation for individualized goals.  Description: FUNCTIONAL STATUS PRIOR TO ADMISSION: Pt reports since previous admission following hip sx, he has not been ambulation and been mostly in bed. His son A him w/ ADLs and transfers to [facility].    HOME SUPPORT: Pt lives with son.    Occupational Therapy Goals:  Initiated 01/28/2022  Patient/Family stated goal: I want to get better  Patient will perform grooming in standing  with min  A within 7 day(s).  Patient will perform UB bathing with Independence within 7 day(s).  Patient will perform LB bathing with Minimal Assist within 7 day(s).  Patient will perform toilet transfers with Minimal Assist  within 7 day(s).  Patient will perform all aspects of toileting with IND within 7 day(s).  Patient will participate in upper extremity therapeutic exercise/activities with Independence within 7 day(s).    Outcome: Progressing   OCCUPATIONAL THERAPY TREATMENT  Patient: Carlos Petersen (71 y.o. male)  Date: 01/31/2022  Primary Diagnosis: Confusion [R41.0]  Diarrhea, unspecified type [R19.7]  AMS (altered mental status) [R41.82]       Precautions: Weight Bearing, General Precautions, Bed Alarm Right Lower Extremity Weight Bearing: Weight Bearing As Tolerated              In place during session: External Catheter  Chart, occupational therapy assessment, plan of care, and goals were reviewed.    ASSESSMENT  Patient continues with skilled OT services and is progressing towards goals. Pt received supine in bed upon OT arrival, agreeable to session. Pt A&O x 4. Patient improving in mobility this date requiring Ax1 to get to EOB but Ax2 needed still for standing and for stand pivot transfer. Pt stood 8mn but pt unable to fully weight shift due to not putting weight through LLE. When initially standing, max A bowel hygiene in standing  performed. Pt completed UB therex while seated EOB demonstrating decreased RUE ROM. Pt was left seated upright in recliner with call bell within reach and all needs met. (See below for objective details and assist levels).     Overall pt tolerated session well today with Ax2 required for OOB transfers and STS. Will continue to benefit from skilled OT services, and will continue to progress as tolerated.     Other factors to consider for discharge: poor safety awareness, not safe to be alone, and concern for safely navigating or managing the home environment        PLAN :  Patient continues to benefit from skilled intervention to address the above impairments.  Continue treatment per established plan of care to address goals.    Recommend with staff: Out of bed to chair for meals and Encourage HEP in prep for ADLs/mobility    Recommend next OT session: UB dressing    Recommendation for discharge: (in order for the patient to meet his/her long term goals): Inpatient Rehabilitation Facility     IF patient discharges home will need the following DME:  TBD       SUBJECTIVE:   Patient stated "It feels good to sit up."    OBJECTIVE DATA SUMMARY:   Cognitive/Behavioral Status:  Orientation  Orientation Level: Oriented X4    Functional Mobility and Transfers for ADLs:  Bed Mobility:  Bed Mobility Training  Bed Mobility Training: Yes  Overall Level of Assistance: Moderate assistance  Interventions: Verbal cues  Rolling:  Moderate assistance  Supine to Sit: Moderate assistance  Scooting: Minimum assistance    Transfers:  Transfer Training  Sit to Stand: Moderate assistance;Minimum assistance;Assist X2  Stand to Sit: Minimum assistance;Moderate assistance;Assist X2  Bed to Chair: Moderate assistance;Maximum assistance;Assist X2    Balance:  Balance  Sitting: Intact  Standing: Impaired  Standing - Static: Fair  Standing - Dynamic: Poor      ADL Intervention:    Toileting: Maximum assistance    Therapeutic exercise:    Exercise  Sets Reps AROM AAROM PROM Self PROM Comments   Shoulder flex/ext 1 10 '[x]'  '[]'  '[]'  '[]'     Elbow flex/ext 1 10 '[x]'  '[]'  '[]'  '[]'     Wrist flex/ext 1 10 '[x]'  '[]'  '[]'  '[]'                 Pain Rating:  0/10     Activity Tolerance:   Fair     After treatment patient left in no apparent distress:   Bed locked and returned to lowest position, Patient left in no apparent distress sitting up in chair and Call bell within reach, and nsg updated     COMMUNICATION/EDUCATION:   The patient's plan of care was discussed with: Physical therapy assistant and Registered nurse  Co treatment provided with PTA this date for increased clinician and patient safety due to Ax2 required for transfers and mobility.    Patient Education  Education Given To: Patient  Education Provided: Home Exercise Advice worker  Education Method: Verbal  Education Outcome: Verbalized understanding;Demonstrated understanding    Thank you for this referral.  Fredric Mare, OTA  Minutes: 25

## 2022-01-31 NOTE — Progress Notes (Signed)
Hospitalist Progress Note        Daily Progress Note: 01/31/2022 11:36 AM  Hospital Course     Carlos Petersen is a 71 y.o.  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  Proximately 1 month ago he underwent ORIF of left hip fracture.  He has done well in terms of recovery except for generalized weakness that is actually gotten worse in the last 1 to 2 weeks.  There is no focal weakness other than he describes some right arm weakness that is chronic and not acute.  He has has chronic loose stools but does admit that his diarrhea has been worse recently.  P.o. intake has been diminished for several weeks but no abdominal pain or blood in stools.  No fevers or chills.  Of note he was recently treated for urinary tract infection with a cephalosporin.  Currently no dysuria.  Regarding his episode of confusion that occurred yesterday he says he just got a large period time.  No loss of consciousness.  No associated headache visual changes or focal weakness or paresthesias at that time.  In talking with his son and patient it appears that his mental status is now improved and that is currently at baseline.  Due to these complaints his orthopedic suggested he present to the emergency room.  He was found to be afebrile and hemodynamically stable.  Head CT without acute process.  Labs showed normal white count and essentially normal electrolytes.  He does have chronic anemia with hemoglobin of 8.6.  Noted albumin is 1.8.  C. difficile testing was positive.  Patient placed in observation for further work-up and management.     6/2-6/4 continues on oral vancomycin and supportive therapy for C. difficile.  PT OT recommending SNF placement. Authorization pending.     Subjective:         Patient seen and examined at bedside.  He reports improvement in diarrhea.  He was able to stand up  and transfer to recliner with PT/OT yesterday.        REVIEW OF SYSTEMS    Constitutional: Positive for fatigue. Negative for chills, fever.   HENT:  Negative for congestion and sore throat.    Eyes:  Negative for blurred vision and double vision.   Respiratory:  Negative for cough, sputum production, shortness of breath and wheezing.    Cardiovascular:  Negative for chest pain, palpitations, orthopnea and leg swelling.   Gastrointestinal:  Positive for abdominal distention, abdominal pain and diarrhea. Negative for blood in stool, constipation, nausea and vomiting.   Genitourinary:  Negative for dysuria, flank pain, frequency, hematuria and urgency.   Musculoskeletal: Positive for arthralgias. Negative for back pain and neck pain.   Skin:  Negative for itching and rash.   Neurological:  Negative for dizziness, speech change, focal weakness, seizures and headaches.   Psychiatric/Behavioral:  Negative for depression. The patient is not nervous/anxious.      Objective:     Current Facility-Administered Medications   Medication Dose Route Frequency    ferrous sulfate (IRON 325) tablet 325 mg  325 mg Oral BID WC    acidophilus/citrus pectin 1 tablet  1 tablet Oral BID    albuterol sulfate HFA (PROVENTIL;VENTOLIN;PROAIR) 108 (90 Base) MCG/ACT inhaler 2 puff  2 puff Inhalation Q6H PRN    amiodarone (CORDARONE) tablet 200 mg  200 mg Oral Daily  apixaban (ELIQUIS) tablet 5 mg  5 mg Oral BID    carbidopa-levodopa (SINEMET CR) 25-100 MG per extended release tablet 4 tablet  4 tablet Oral TID    pantoprazole (PROTONIX) tablet 40 mg  40 mg Oral QAM AC    ranolazine (RANEXA) extended release tablet 500 mg  500 mg Oral BID    rosuvastatin (CRESTOR) tablet 20 mg  20 mg Oral Nightly    insulin lispro (HUMALOG) injection vial 0-4 Units  0-4 Units SubCUTAneous TID WC    insulin lispro (HUMALOG) injection vial 0-4 Units  0-4 Units SubCUTAneous Nightly    glucose chewable tablet 16 g  4 tablet Oral PRN    dextrose bolus 10% 125 mL   125 mL IntraVENous PRN    Or    dextrose bolus 10% 250 mL  250 mL IntraVENous PRN    glucagon injection 1 mg  1 mg SubCUTAneous PRN    dextrose 10 % infusion   IntraVENous Continuous PRN    budesonide-formoterol (SYMBICORT) 160-4.5 MCG/ACT inhaler 2 puff  2 puff Inhalation BID    And    tiotropium (SPIRIVA RESPIMAT) 2.5 MCG/ACT inhaler 2 puff  2 puff Inhalation Daily    vancomycin (VANCOCIN) capsule 125 mg  125 mg Oral 4x Daily       BP 139/72   Pulse 76   Temp 97.6 F (36.4 C) (Oral)   Resp 18   Ht 1.803 m (5' 10.98")   Wt 95.3 kg (210 lb)   SpO2 98%   BMI 29.30 kg/m         Temp (24hrs), Avg:97.9 F (36.6 C), Min:97.6 F (36.4 C), Max:98.2 F (36.8 C)      No intake/output data recorded.  06/02 1901 - 06/04 0700  In: 500 [P.O.:500]  Out: 1300 [Urine:1300]    PHYSICAL EXAM:    Constitutional: No acute distress  Skin: Extremities and face reveal no rashes.   HEENT: Sclerae anicteric. PERRL. No oral ulcers. The neck is supple and no masses.   Cardiovascular: Regular rate and rhythm. +S1/S2. No murmur or gallop. No JVD.   Respiratory:  Clear breath sounds bilaterally with no wheezes, rales, or rhonchi.   GI: Abdomen nondistended, soft, and nontender. Normal active bowel sounds.  Rectal: Deferred   Musculoskeletal: No pitting edema of the lower legs. Able to move all ext  Neurological:  Patient is A&Ox3. Cranial nerves II-XII grossly intact  Psychiatric: Mood and affect appropriate    Data Review    Recent Results (from the past 24 hour(s))   POCT Glucose    Collection Time: 01/31/22  8:37 AM   Result Value Ref Range    POC Glucose 103 (H) 65 - 100 mg/dL    Performed by: [facility]        CT CERVICAL SPINE WO CONTRAST   Final Result   1. No acute fracture or subluxation.   2. Incompletely visualized right pleural effusion.          CT HEAD WO CONTRAST   Final Result      No acute traumatic injury.             Principal Problem:    AMS (altered mental status)  Active Problems:    Parkinson's disease (HCC)     C. difficile colitis    Diarrhea    Generalized weakness    Iron deficiency anemia  Resolved Problems:    * No resolved hospital problems. *  Assessment/Plan:     1. C difficile colitis   - Suspected recent antibiotic use.  He does have chronic diarrhea but certainly worse recently. Appeared septic on admission but no abdominal tenderness.   - Tolerating p.o. well   - 10-day course of vancomycin.  Try to limit any antibiotics.    - Add Florastor probiotic     2. Metabolic encephalopathy   - Transient episode that sounds more like encephalopathy rather than acute stroke.  He did have a head CT that showed no acute process.  - MRI due to pacemaker and likely consultation for stroke is low.  We will monitor his clinical exam  -  Very low suspicion for stroke. At baseline mentation. He does have hx Parkinson.     3. Parkinson's disease   - Diagnosed many years ago and just on a low-dose Sinemet.  Has not had any recent follow-up with neurology in the last few years. His failure to thrive may be due to Parkinson's disease.  - Therapy evaluated him watching for evidence of freezing with mobility. - Continue Sinemet.     4. NIDDM2 complicated by HLD   - On Jardiance at home.  Given decreased intake and diarrhea we will hold oral agents and just cover with sliding scale for now     5. HLD -continue statin     6. Hx PE   - Given the left hip x-ray increased risk for recurrent DVT will continue with Eliquis. He does have anemia at least since his surgery but is actually improved and no evidence of overt bleeding.  Check iron studies.     7. COPD/asthma  - Ongoing tobacco use  - No indication for systemic steroids.  Continue Trelegy     8. Recent left hop fracture s/p ORIF - Incision is well approximated without any overt signs of infection.  He is weightbearing as tolerated.     9. Severe protein calorie malnutrition   - Now exacerbated by C. difficile infection.  He is tolerating p.o.   - Nutrition consult add  supplements especially to improve protein stores.     10. Iron deficiency anemia   - Exacerbated by recent surgery and a component of nutritional deficiencies  - Continue oral iron replacement therapy     11. Generalized weakness   - PT OT consulted recommending SNF      DVT Prophylaxis: on Eliquis   Code Status: No Order DNR patient?  POA/NOKShari Heritage, [phone]    Social determinants of health: None    Disposition and discharge barriers:  SNF authorization    Care Plan discussed with: patient and nursing   _____________________________________________________________________________  Time spent in direct care including coordination of service, review of data and examination: > 35 minutes    ______________________________________________________________________________    Carmon Sails, PA-C

## 2022-01-31 NOTE — Plan of Care (Signed)
Problem: Physical Therapy - Adult  Goal: By Discharge: Performs mobility at highest level of function for planned discharge setting.  See evaluation for individualized goals.  Description: FUNCTIONAL STATUS PRIOR TO ADMISSION: The patient was functional at the wheelchair level and required maximum assistancefor transfers to the chair.    HOME SUPPORT PRIOR TO ADMISSION: The patient lived with son and required moderate assistance for ADLs.    Physical Therapy Goals  Initiated 01/28/2022  Pt stated goal: go home  Pt will be I with LE HEP in 7 days.  Pt will perform bed mobility with Minimal Assist in 7 days.  Pt will perform transfers with Minimal Assist in 7 days.   Pt will amb 10 feet with LRAD safely with Moderate Assist in 7 days.   Pt will verbalize and demonstrate compliance with hip precautions to include no hip flex/IR/ADD in 7 days.   Pt will demonstrate improvement in standing balance from fair  to fair + in 7 days.    Outcome: Progressing            PHYSICAL THERAPY TREATMENT     Patient: Carlos Petersen (71 y.o. male)  Date: 01/31/2022  Diagnosis: Confusion [R41.0]  Diarrhea, unspecified type [R19.7]  AMS (altered mental status) [R41.82] AMS (altered mental status)      Precautions:  Weight Bearing, General Precautions, Bed Alarm Right Lower Extremity Weight Bearing: Weight Bearing As Tolerated              In place during session: External Catheter  Chart, physical therapy assessment, plan of care and goals were reviewed.    ASSESSMENT  Patient continues with skilled PT services and is progressing towards goals. Pt semi-supine upon PT arrival, agreeable to session. Cotx with OT due to level of assist required for OOB mobility for safety of patient/clinicians. Pt A&O x 4. (See below for objective details and assist levels).     Overall pt tolerated session well today with 2 stands performed and transfer to recliner with mod-maxA x 2 plus RW for safety. Patient unable to bear weight fully on LLE, despite  being WBAT, but he was able to hop some and offload on RW to perform SPT to recliner. Will continue to benefit from skilled PT services, and will continue to progress as tolerated.     Current Level of Function Impacting Discharge (mobility/balance): decr strength, decr mobility, decr endurance     Other factors to consider for discharge: high risk for falls, not safe to be alone, and concern for safely navigating or managing the home environment , CDIFF     PLAN :  Patient continues to benefit from skilled intervention to address impaired functional mobility, decreased independence in ADLs, decreased ROM, impaired strength, decreased activity tolerance, decreased coordination, impaired balance, and increased pain levels. Continue treatment per established plan of care to address goals.    Recommend with staff: Out of bed to chair for meals, Encourage HEP in prep for ADLs/mobility, and LE elevation for management of edema    Recommendation for discharge: (in order for the patient to meet his/her long term goals)  Skilled Nursing Facility    IF patient discharges home will need the following BZJ:IRCV belt and rolling walker       SUBJECTIVE:   Patient stated "my left leg feels so weak, which is why I can't walk on it."    OBJECTIVE DATA SUMMARY:   Critical Behavior:  Orientation  Orientation Level: Oriented X4  Functional Mobility Training:  Bed Mobility:  Bed Mobility Training  Bed Mobility Training: Yes  Overall Level of Assistance: Moderate assistance  Interventions: Verbal cues  Rolling: Moderate assistance  Supine to Sit: Moderate assistance  Scooting: Minimum assistance  Transfers:  Transfer Training  Sit to Stand: Moderate assistance;Minimum assistance;Assist X2  Stand to Sit: Minimum assistance;Moderate assistance;Assist X2  Bed to Chair: Moderate assistance;Maximum assistance;Assist X2  Balance:  Balance  Sitting: Intact  Standing: Impaired  Standing - Static: Fair  Standing - Dynamic:  Poor    Ambulation/Gait Training:     Gait  Overall Level of Assistance: Moderate assistance;Maximum assistance;Assist X2  Interventions: Verbal cues;Tactile cues  Base of Support: Narrowed  Speed/Cadence: Shuffled  Step Length: Right shortened  Assistive Device: Walker, rolling      Therapeutic Exercises:   Seated EOB     EXERCISE   Sets   Reps   Active Active Assist   Passive Self ROM   Comments   Ankle Pumps   []  []  []  []     Quad Sets/Glut Sets   []  []  []  []     Hamstring Sets   []  []  []  []     Short Arc Quads   []  []  []  []     Heel Slides   []  []  []  []     Straight Leg Raises   []  []  []  []     Hip abd/add   []  []  []  []     Long Arc Quads 1 10 [x]  []  []  []     Marching   []  []  []  []        []  []  []  []        Pain Rating:  0/10     Activity Tolerance:   Fair , requires rest breaks, observed shortness of breath on exertion, and SpO2 stable on room air    After treatment patient left in no apparent distress:   Bed locked and returned to lowest position, Patient left in no apparent distress sitting up in chair and Call bell within reach, and nsg updated     COMMUNICATION/COLLABORATION:   The patient's plan of care was discussed with: Physical therapist, Occupational therapy assistant, Registered nurse, and Certified nursing assistant/patient care technician    Patient Education  Education Given To: Patient  Education Provided: Role of Therapy;Home Exercise Program;Fall Prevention  Education Method: Demonstration;Verbal;Teach Back  Barriers to Learning: Other (Comment) (length since onest, medical complications)  Education Outcome: Verbalized understanding;Demonstrated understanding;Continued education needed      , PTA  Minutes: 25

## 2022-02-01 LAB — BASIC METABOLIC PANEL
Anion Gap: 5 mmol/L (ref 5–15)
BUN: 4 mg/dL — ABNORMAL LOW (ref 6–20)
Bun/Cre Ratio: 8 — ABNORMAL LOW (ref 12–20)
CO2: 27 mmol/L (ref 21–32)
Calcium: 7.7 mg/dL — ABNORMAL LOW (ref 8.5–10.1)
Chloride: 108 mmol/L (ref 97–108)
Creatinine: 0.5 mg/dL — ABNORMAL LOW (ref 0.70–1.30)
Est, Glom Filt Rate: >60 mL/min/{1.73_m2} (ref >60)
Glucose: 87 mg/dL (ref 65–100)
Potassium: 3.3 mmol/L — ABNORMAL LOW (ref 3.5–5.1)
Sodium: 140 mmol/L (ref 136–145)

## 2022-02-01 LAB — CBC WITH AUTO DIFFERENTIAL
Absolute Immature Granulocyte: 0.1 10*3/uL — ABNORMAL HIGH (ref 0.00–0.04)
Basophils %: 1 % (ref 0–1)
Basophils Absolute: 0 10*3/uL (ref 0.0–0.1)
Eosinophils %: 1 % (ref 0–7)
Eosinophils Absolute: 0.1 10*3/uL (ref 0.0–0.4)
Hematocrit: 28.9 % — ABNORMAL LOW (ref 36.6–50.3)
Hemoglobin: 8.4 g/dL — ABNORMAL LOW (ref 12.1–17.0)
Immature Granulocytes: 1 % — ABNORMAL HIGH (ref 0–0.5)
Lymphocytes %: 35 % (ref 12–49)
Lymphocytes Absolute: 1.8 10*3/uL (ref 0.8–3.5)
MCH: 24.4 PG — ABNORMAL LOW (ref 26.0–34.0)
MCHC: 29.1 g/dL — ABNORMAL LOW (ref 30.0–36.5)
MCV: 84 FL (ref 80.0–99.0)
MPV: 8.5 FL — ABNORMAL LOW (ref 8.9–12.9)
Monocytes %: 11 % (ref 5–13)
Monocytes Absolute: 0.5 10*3/uL (ref 0.0–1.0)
Neutrophils %: 51 % (ref 32–75)
Neutrophils Absolute: 2.5 10*3/uL (ref 1.8–8.0)
Nucleated RBCs: 0 PER 100 WBC
Platelets: 190 10*3/uL (ref 150–400)
RBC: 3.44 M/uL — ABNORMAL LOW (ref 4.10–5.70)
RDW: 20.7 % — ABNORMAL HIGH (ref 11.5–14.5)
WBC: 5 10*3/uL (ref 4.1–11.1)
nRBC: 0 10*3/uL (ref 0.00–0.01)

## 2022-02-01 MED ORDER — FERROUS SULFATE 325 (65 FE) MG PO TABS
325 (65 Fe) MG | ORAL_TABLET | Freq: Every day | ORAL | 0 refills | Status: AC
Start: 2022-02-01 — End: 2022-03-03

## 2022-02-01 MED ORDER — VANCOMYCIN HCL 125 MG PO CAPS
125 MG | ORAL_CAPSULE | Freq: Four times a day (QID) | ORAL | 0 refills | Status: DC
Start: 2022-02-01 — End: 2022-02-01

## 2022-02-01 MED ORDER — POTASSIUM CHLORIDE CRYS ER 20 MEQ PO TBCR
20 MEQ | Freq: Once | ORAL | Status: AC
Start: 2022-02-01 — End: 2022-02-01
  Administered 2022-02-01: 14:00:00 40 meq via ORAL

## 2022-02-01 MED ORDER — VANCOMYCIN HCL 125 MG PO CAPS
125 MG | ORAL_CAPSULE | Freq: Four times a day (QID) | ORAL | 0 refills | Status: AC
Start: 2022-02-01 — End: 2022-02-11

## 2022-02-01 MED ORDER — ACIDOPHILUS/CITRUS PECTIN PO TABS
ORAL_TABLET | Freq: Two times a day (BID) | ORAL | 0 refills | Status: AC
Start: 2022-02-01 — End: 2022-02-11

## 2022-02-01 MED FILL — POTASSIUM CHLORIDE CRYS ER 20 MEQ PO TBCR: 20 MEQ | ORAL | Qty: 2

## 2022-02-01 MED FILL — FERROUS SULFATE 325 (65 FE) MG PO TABS: 325 (65 Fe) MG | ORAL | Qty: 1

## 2022-02-01 MED FILL — ACIDOPHILUS/CITRUS PECTIN PO TABS: ORAL | Qty: 1

## 2022-02-01 MED FILL — SYMBICORT 160-4.5 MCG/ACT IN AERO: 160-4.5 MCG/ACT | RESPIRATORY_TRACT | Qty: 6

## 2022-02-01 MED FILL — SPIRIVA RESPIMAT 2.5 MCG/ACT IN AERS: 2.5 MCG/ACT | RESPIRATORY_TRACT | Qty: 16

## 2022-02-01 NOTE — Plan of Care (Signed)
PHYSICAL THERAPY TREATMENT     Patient: Carlos Petersen (71 y.o. male)  Date: 02/01/2022  Diagnosis: Confusion [R41.0]  Diarrhea, unspecified type [R19.7]  AMS (altered mental status) [R41.82] AMS (altered mental status)      Precautions:  Weight Bearing, General Precautions, Bed Alarm Right Lower Extremity Weight Bearing: Weight Bearing As Tolerated              In place during session: External Catheter  Chart, physical therapy assessment, plan of care and goals were reviewed.    ASSESSMENT  Patient continues with skilled PT services and is slowly progressing towards goals. Pt received semi supine upon PT/OT arrival, agreeable to session. Pt A&O x 4 and agreeable to get into chair. (See below for objective details and assist levels). Pt. Continues to require assist X 2 for Tfs and taking a few side steps. Pt. Has difficulty with weight shifting onto left LE  in order to advance R LE. Pt. Needs constant vcs for keeping arms extended and knees while standing and with Tfs. Pt. Fatigues easily and requires rest breaks often.    Overall pt tolerated session fair  today with mobility.  Will continue to benefit from skilled PT services, and will continue to progress as tolerated.     Current Level of Function Impacting Discharge (mobility/balance): Mod/Max A X 2 for Mobility    Other factors to consider for discharge: poor safety awareness, high risk for falls, not safe to be alone, and concern for safely navigating or managing the home environment       PLAN :  Patient continues to benefit from skilled intervention to address impaired functional mobility, decreased independence in ADLs, impaired strength, poor body mechanics, decreased activity tolerance, poor safety awareness, impaired balance, and impaired posture. Continue treatment per established plan of care to address goals.    Recommend with staff: Out of bed to chair for meals, Encourage HEP in prep for ADLs/mobility, Use of bed/chair alarm for safety,  and LE elevation for management of edema    Recommendation for discharge: (in order for the patient to meet his/her long term goals)  Skilled Nursing Facility    IF patient discharges home will need the following DME: TBD       SUBJECTIVE:   Patient stated "I sat up for 2 hours yesterday."    OBJECTIVE DATA SUMMARY:   Critical Behavior:  Orientation  Orientation Level: Oriented X4  Cognition  Arousal/Alertness: Appropriate responses to stimuli  Following Commands: Follows multistep commands with increased time;Follows multistep commands with repitition  Problem Solving: Decreased awareness of errors  Insights: Decreased awareness of deficits  Initiation: Requires cues for some  Sequencing: Requires cues for some    Functional Mobility Training:  Bed Mobility:  Bed Mobility Training  Scooting: Contact-guard assistance  Transfers:  Transfer Training  Sit to Stand: Moderate assistance;Assist X2  Stand to Sit: Moderate assistance;Assist X2  Bed to Chair: Moderate assistance;Assist X2;Maximum assistance  Pt. Performed 2 trials sit to stand  Balance:  Balance  Sitting: Intact  Standing: Impaired  Standing - Static: Constant support;Fair  Standing - Dynamic: Constant support;Poor  Ambulation/Gait Training:     Gait  Overall Level of Assistance: Moderate assistance;Maximum assistance;Assist X2  Interventions: Verbal cues;Tactile cues  Base of Support: Narrowed  Speed/Cadence: Shuffled  Step Length: Right shortened  Stance: Left decreased  Gait Abnormalities: Shuffling gait  Distance (ft): 2 Feet  Assistive Device: Walker, rolling      Therapeutic Exercises:  EXERCISE   Sets   Reps   Active Active Assist   Passive Self ROM   Comments   Ankle Pumps  20 [x]  []  []  []     Quad Sets/Glut Sets  12 [x]  []  []  []     Hamstring Sets   []  []  []  []     Short Arc Quads   []  []  []  []     Heel Slides 2 5 [x]  []  []  []     Straight Leg Raises   []  []  []  []     Hip abd/add 2 5 [x]  []  []  []     Long Arc Quads 2 6 [x]  []  []  []     Marching   []  []  []   []        []  []  []  []        Pain Rating:  0/10   Pain Intervention(s):   nursing notified and repositioning    Activity Tolerance:   Fair  and requires frequent rest breaks    After treatment patient left in no apparent distress:   Bed locked and returned to lowest position, Patient left in no apparent distress sitting up in chair and Call bell within reach, and nsg updated       COMMUNICATION/COLLABORATION:   The patient's plan of care was discussed with: Registered nurse    Patient Education  Education Given To: Patient  Education Provided: Role of Therapy;Home Exercise Program;Fall Prevention  Education Method: Demonstration;Verbal;Teach Back  Barriers to Learning: None  Education Outcome: Verbalized understanding;Continued education needed  PT/OT sessions occurred together for increased safety of pt and clinician.     , PTA  Minutes: 28      Problem: Physical Therapy - Adult  Goal: By Discharge: Performs mobility at highest level of function for planned discharge setting.  See evaluation for individualized goals.  Description: FUNCTIONAL STATUS PRIOR TO ADMISSION: The patient was functional at the wheelchair level and required maximum assistancefor transfers to the chair.    HOME SUPPORT PRIOR TO ADMISSION: The patient lived with son and required moderate assistance for ADLs.    Physical Therapy Goals  Initiated 01/28/2022  Pt stated goal: go home  Pt will be I with LE HEP in 7 days.  Pt will perform bed mobility with Minimal Assist in 7 days.  Pt will perform transfers with Minimal Assist in 7 days.   Pt will amb 10 feet with LRAD safely with Moderate Assist in 7 days.   Pt will verbalize and demonstrate compliance with hip precautions to include no hip flex/IR/ADD in 7 days.   Pt will demonstrate improvement in standing balance from fair  to fair + in 7 days.    Outcome: Progressing

## 2022-02-01 NOTE — Care Coordination-Inpatient (Addendum)
1047: CM noted discharge order.  Patient gave choice for [facility], but CM is awaiting acceptance and authorization at [facility].      CM has reached out to [facility] to ask them to review and start auth if they can accept.  CM made them aware patient is ready for discharge.    CM will continue to follow.    1339: CM heard back from [facility].  They do not have any beds and have a waiting list at this time.      CM spoke with patient's son.  He stated that no other SNF is acceptable for patient and he will just take the patient back home.  CM explained benefits of SNF for patient.  Son still states his father cannot go to a SNF.    Son states his father was current with [facility] at one point.  Choice letter signed and placed on chart.  Referral sent.    [facility] declined patient due to insurance.  CM called and they stated that he was discharged from their services on 01/14/22.    Additional HH referrals sent.      Patient has been accepted with [facility].  CM has sent DC orders and summary.    Patient's son will be at the hospital to pick patient up around 3pm.    Primary RN made aware.    Transition of Care Plan:    RUR: N/A  Prior Level of Functioning: Needs assistance  Disposition: Home with HH  If SNF or IPR: Date FOC offered: N/A  Date FOC received: N/A  Accepting facility: N/A  Date authorization started with reference number: N/A  Date authorization received and expires: N/A  Follow up appointments: Yes  DME needed: N/A  Transportation at discharge: Son  IM/IMM Medicare/Tricare letter given: Yes  Is patient a Veteran and connected with VA? N/A   If yes, was [place] transfer form completed and VA notified?   Caregiver Contact: Valere Dross, Son  Discharge Caregiver contacted prior to discharge? Yes  Care Conference needed? No  Barriers to discharge: None

## 2022-02-01 NOTE — Discharge Summary (Cosign Needed)
HOSPITAL DISCHARGE SUMMARY    Patient ID:    Carlos Petersen  [id number]  71 y.o.  1950-09-24    Admit date: 01/26/2022    Discharge date : 02/01/2022        Final Diagnoses:   Principal Problem:    AMS (altered mental status)  Active Problems:    Parkinson's disease (HCC)    C. difficile colitis    Diarrhea    Generalized weakness    Iron deficiency anemia  Resolved Problems:    * No resolved hospital problems. *      Reason for Hospitalization:  Weakness, diarrhea, AMS    Hospital Course:   Pt admitted for generalized weakness, diarrhea. He underwent a left hemiarthroplasty 4 weeks ago. He had an ER visit on 01/15/22 and was diagnosed with UTI, sent home on 7 days of cefdinir, and presented with diarrhea and weakness on 01/26/22. Stool was positive for C-difficile and was started on oral vancomycin and probiotics. He was seen by orthopedic surgery and staples were removed. He has history of Parkinson's disease with progressive weakness. Pt was evaluated by  PT/OT and was recommended for SNF placement, however, son decided to take his father home with home health. Diarrhea has greatly improved and stools are more formed. Pt to continue with vancomycin for 10 days along with probiotics. Pt to follow up with orthopedic surgery in one month for repeat left hip X- ray. Pt is stable for discharge home today with family and home health services.     Discharge Medications:      Medication List        START taking these medications      acidophilus/citrus pectin Tabs  Take 1 tablet by mouth 2 times daily for 10 days     cholestyramine 4 g packet  Commonly known as: Questran  Take 1 packet by mouth 3 times daily (with meals) for 7 days     ferrous sulfate 325 (65 Fe) MG tablet  Commonly known as: IRON 325  Take 1 tablet by mouth daily (with breakfast)     vancomycin 125 MG capsule  Commonly known as: VANCOCIN  Take 2 capsules by mouth 4 times daily for 10 days            CONTINUE taking these medications      albuterol sulfate  HFA 108 (90 Base) MCG/ACT inhaler  Commonly known as: PROVENTIL;VENTOLIN;PROAIR     alendronate 70 MG tablet  Commonly known as: FOSAMAX     amiodarone 200 MG tablet  Commonly known as: CORDARONE     apixaban 5 MG Tabs tablet  Commonly known as: ELIQUIS     carbidopa-levodopa 50-200 MG per extended release tablet  Commonly known as: SINEMET CR     carvedilol 6.25 MG tablet  Commonly known as: COREG     gabapentin 100 MG capsule  Commonly known as: NEURONTIN     Jardiance 10 MG tablet  Generic drug: empagliflozin     omeprazole 40 MG delayed release capsule  Commonly known as: PRILOSEC     ranolazine 500 MG extended release tablet  Commonly known as: RANEXA     rosuvastatin 20 MG tablet  Commonly known as: CRESTOR     Trelegy Ellipta 100-62.5-25 MCG/ACT Aepb inhaler  Generic drug: fluticasone-umeclidin-vilant     VORTIoxetine HBr 20 MG Tabs tablet  Commonly known as: TRINTELLIX            STOP taking these medications  ondansetron 4 MG tablet  Commonly known as: ZOFRAN     oxyCODONE 5 MG immediate release tablet  Commonly known as: ROXICODONE               Where to Get Your Medications        These medications were sent to [facility] [place], [place] - [address] - P [phone] Carmon Ginsberg [id number]  [address] [place], [place] [address]      Phone: [phone]   acidophilus/citrus pectin Tabs  cholestyramine 4 g packet  ferrous sulfate 325 (65 Fe) MG tablet  vancomycin 125 MG capsule           Follow up Care:    1. Herma Mering, MD in 2 weeks.      Herma Mering, MD  [address]  [place]  [place] [address]  [phone]    Schedule an appointment as soon as possible for a visit in 2 week(s)      Smith Mince, MD  [address] [facility] [address]  [address]  [place] [address]  [phone]    Follow up in 1 month(s)          * Follow-up Care/Patient Instructions:  Activity: activity as tolerated  Diet: regular diet  Wound Care: keep wound clean and dry      Condition at  Discharge:  Stable  __________________________________________________________________    Disposition   Home heatlh  ____________________________________________________________________    Code Status:  Full Code  ___________________________________________________________________    Discharge Exam:  Patient seen and examined by me on discharge day.  Physical Exam  Constitutional:       General: He is not in acute distress.  Cardiovascular:      Rate and Rhythm: Normal rate. Rhythm irregular.      Pulses: Normal pulses.      Heart sounds: Murmur heard.   Pulmonary:      Effort: Pulmonary effort is normal.      Breath sounds: Normal breath sounds.   Abdominal:      General: Bowel sounds are normal.      Palpations: Abdomen is soft.   Musculoskeletal:         General: Normal range of motion.   Skin:     General: Skin is warm and dry.      Comments: Left hip surgical site healing, no drainage, incision approximated.    Neurological:      Mental Status: He is oriented to person, place, and time.        CONSULTATIONS: orthopedic surgery    Significant Diagnostic Studies:   Recent Results (from the past 24 hour(s))   Basic Metabolic Panel    Collection Time: 01/31/22  2:40 PM   Result Value Ref Range    Sodium 141 136 - 145 mmol/L    Potassium 3.4 (L) 3.5 - 5.1 mmol/L    Chloride 111 (H) 97 - 108 mmol/L    CO2 25 21 - 32 mmol/L    Anion Gap 5 5 - 15 mmol/L    Glucose 126 (H) 65 - 100 mg/dL    BUN 5 (L) 6 - 20 mg/dL    Creatinine 4.33 (L) 0.70 - 1.30 mg/dL    Bun/Cre Ratio 10 (L) 12 - 20      Est, Glom Filt Rate >60 >60 ml/min/1.38m2    Calcium 7.6 (L) 8.5 - 10.1 mg/dL   CBC with Auto Differential  Collection Time: 01/31/22  2:40 PM   Result Value Ref Range    WBC 6.1 4.1 - 11.1 K/uL    RBC 3.68 (L) 4.10 - 5.70 M/uL    Hemoglobin 9.0 (L) 12.1 - 17.0 g/dL    Hematocrit 25.3 (L) 36.6 - 50.3 %    MCV 84.0 80.0 - 99.0 FL    MCH 24.5 (L) 26.0 - 34.0 PG    MCHC 29.1 (L) 30.0 - 36.5 g/dL    RDW 66.4 (H) 40.3 - 14.5 %     Platelets 218 150 - 400 K/uL    MPV 8.3 (L) 8.9 - 12.9 FL    Nucleated RBCs 0.0 0.0 PER 100 WBC    nRBC 0.00 0.00 - 0.01 K/uL    Neutrophils % 56 32 - 75 %    Lymphocytes % 31 12 - 49 %    Monocytes % 10 5 - 13 %    Eosinophils % 1 0 - 7 %    Basophils % 1 0 - 1 %    Immature Granulocytes 1 (H) 0 - 0.5 %    Neutrophils Absolute 3.4 1.8 - 8.0 K/UL    Lymphocytes Absolute 1.9 0.8 - 3.5 K/UL    Monocytes Absolute 0.6 0.0 - 1.0 K/UL    Eosinophils Absolute 0.1 0.0 - 0.4 K/UL    Basophils Absolute 0.0 0.0 - 0.1 K/UL    Absolute Immature Granulocyte 0.1 (H) 0.00 - 0.04 K/UL    Differential Type AUTOMATED     Basic Metabolic Panel    Collection Time: 02/01/22  5:11 AM   Result Value Ref Range    Sodium 140 136 - 145 mmol/L    Potassium 3.3 (L) 3.5 - 5.1 mmol/L    Chloride 108 97 - 108 mmol/L    CO2 27 21 - 32 mmol/L    Anion Gap 5 5 - 15 mmol/L    Glucose 87 65 - 100 mg/dL    BUN 4 (L) 6 - 20 mg/dL    Creatinine 4.74 (L) 0.70 - 1.30 mg/dL    Bun/Cre Ratio 8 (L) 12 - 20      Est, Glom Filt Rate >60 >60 ml/min/1.71m2    Calcium 7.7 (L) 8.5 - 10.1 mg/dL   CBC with Auto Differential    Collection Time: 02/01/22  5:11 AM   Result Value Ref Range    WBC 5.0 4.1 - 11.1 K/uL    RBC 3.44 (L) 4.10 - 5.70 M/uL    Hemoglobin 8.4 (L) 12.1 - 17.0 g/dL    Hematocrit 25.9 (L) 36.6 - 50.3 %    MCV 84.0 80.0 - 99.0 FL    MCH 24.4 (L) 26.0 - 34.0 PG    MCHC 29.1 (L) 30.0 - 36.5 g/dL    RDW 56.3 (H) 87.5 - 14.5 %    Platelets 190 150 - 400 K/uL    MPV 8.5 (L) 8.9 - 12.9 FL    Nucleated RBCs 0.0 0.0 PER 100 WBC    nRBC 0.00 0.00 - 0.01 K/uL    Neutrophils % 51 32 - 75 %    Lymphocytes % 35 12 - 49 %    Monocytes % 11 5 - 13 %    Eosinophils % 1 0 - 7 %    Basophils % 1 0 - 1 %    Immature Granulocytes 1 (H) 0 - 0.5 %    Neutrophils Absolute 2.5 1.8 - 8.0 K/UL    Lymphocytes  Absolute 1.8 0.8 - 3.5 K/UL    Monocytes Absolute 0.5 0.0 - 1.0 K/UL    Eosinophils Absolute 0.1 0.0 - 0.4 K/UL    Basophils Absolute 0.0 0.0 - 0.1 K/UL    Absolute  Immature Granulocyte 0.1 (H) 0.00 - 0.04 K/UL    Differential Type AUTOMATED       CT CERVICAL SPINE WO CONTRAST   Final Result   1. No acute fracture or subluxation.   2. Incompletely visualized right pleural effusion.          CT HEAD WO CONTRAST   Final Result      No acute traumatic injury.             Discharge: time spent 40 minutes in discharge  Education and counseling.     Signed:  Estill Batten, APRN - NP  02/01/2022  2:32 PM

## 2022-02-01 NOTE — Plan of Care (Signed)
OCCUPATIONAL THERAPY TREATMENT  Patient: Carlos Petersen (71 y.o. male)  Date: 02/01/2022  Primary Diagnosis: Confusion [R41.0]  Diarrhea, unspecified type [R19.7]  AMS (altered mental status) [R41.82]       Precautions: Weight Bearing, General Precautions, Bed Alarm Right Lower Extremity Weight Bearing: Weight Bearing As Tolerated              In place during session: External Catheter  Chart, occupational therapy assessment, plan of care, and goals were reviewed.    ASSESSMENT  Patient continues with skilled OT services and is progressing towards goals. Pt sitting at EOB upon OT arrival, agreeable to session. Pt A&O x 4. (See below for objective details and assist levels).     Overall pt tolerated session fair today with completion of transfers, therex, and ADLs.  Pt completed scooting at EOB with CGA and able to complete transfers from EOB with ModA x2 and take side steps using RW for balance upon standing.  Pt required seated rest break and able to complete remainder of transfer to recliner.   Pt required cueing for upright standing posture, weight shifting, having wider BOS, and RW management.  Standing bowel hygiene completed with total A due to being soiled.  Pt noted with difficulty placing weight through LLE.  Pt completed seated grooming in chair with setup A and able to complete seated therex with cueing and assist provided for RUE.  Donning of clean gown completed with Min/ModA due to unable to use RUE for much assist.  Pt left seated in recliner with call bell within reach and needs met. Will continue to benefit from skilled OT services, and will continue to progress as tolerated.     Other factors to consider for discharge: high risk for falls, not safe to be alone, and concern for safely navigating or managing the home environment        PLAN :  Patient continues to benefit from skilled intervention to address the above impairments.  Continue treatment per established plan of care to address  goals.    Recommend with staff: Out of bed to chair for meals and Encourage HEP in prep for ADLs/mobility    Recommend next OT session: Toileting, LB dressing, and Seated grooming    Recommendation for discharge: (in order for the patient to meet his/her long term goals): [name]    IF patient discharges home will need the following DME: TBD       SUBJECTIVE:   Patient stated "I wasn't able to move my arm like this yesterday."    OBJECTIVE DATA SUMMARY:   Cognitive/Behavioral Status:  Orientation  Orientation Level: Oriented X4  Cognition  Arousal/Alertness: Appropriate responses to stimuli  Following Commands: Follows multistep commands with increased time;Follows multistep commands with repitition  Problem Solving: Decreased awareness of errors  Insights: Decreased awareness of deficits  Initiation: Requires cues for some  Sequencing: Requires cues for some    Functional Mobility and Transfers for ADLs:  Bed Mobility:  Bed Mobility Training  Scooting: Contact-guard assistance    Transfers:  Transfer Training  Sit to Stand: Moderate assistance;Assist X2  Stand to Sit: Moderate assistance;Assist X2  Bed to Chair: Moderate assistance;Assist X2;Maximum assistance    Balance:  Balance  Sitting: Intact  Standing: Impaired  Standing - Static: Constant support;Fair  Standing - Dynamic: Constant support;Poor    ADL Intervention:  Grooming: Setup   Grooming Skilled Clinical Factors: seated face washing in recliner    UE Dressing: Minimal assistance;Moderate  assistance  UE Dressing Skilled Clinical Factors: seated donning/doffing of clean gown    Toileting: Dependent/Total  Toileting Skilled Clinical Factors: standing bowel hygiene    Therapeutic exercise:  Exercise Sets Reps AROM AAROM PROM Self PROM Comments   Shoulder flex/ext 1 8 '[x]'  '[x]'  '[]'  '[]'     Elbow flex/ext 1 5 '[x]'  '[]'  '[]'  '[]'       Pain Rating:  0/10    Activity Tolerance:   Fair  and requires frequent rest breaks    After treatment patient left in no  apparent distress:   Bed locked and returned to lowest position, Patient left in no apparent distress sitting up in chair and Call bell within reach, and nsg updated     COMMUNICATION/EDUCATION:   The patient's plan of care was discussed with: Physical therapy assistant and Registered nurse    Patient Education  Education Given To: Patient  Education Provided: Role of Therapy;Transfer Training;Energy Conservation;Home Exercise Program  Education Method: Demonstration;Verbal  Education Outcome: Verbalized understanding;Demonstrated understanding    PT/OT sessions occurred together for increased safety of pt and clinician.    Thank you for this referral.  Lakina Mcintire, OTA  Minutes: 28    Problem: Occupational Therapy - Adult  Goal: By Discharge: Performs self-care activities at highest level of function for planned discharge setting.  See evaluation for individualized goals.  Description: FUNCTIONAL STATUS PRIOR TO ADMISSION: Pt reports since previous admission following hip sx, he has not been ambulation and been mostly in bed. His son A him w/ ADLs and transfers to [facility].    HOME SUPPORT: Pt lives with son.    Occupational Therapy Goals:  Initiated 01/28/2022  Patient/Family stated goal: I want to get better  Patient will perform grooming in standing  with min  A within 7 day(s).  Patient will perform UB bathing with Independence within 7 day(s).  Patient will perform LB bathing with Minimal Assist within 7 day(s).  Patient will perform toilet transfers with Minimal Assist  within 7 day(s).  Patient will perform all aspects of toileting with IND within 7 day(s).  Patient will participate in upper extremity therapeutic exercise/activities with Independence within 7 day(s).    Outcome: Progressing

## 2022-02-01 NOTE — Discharge Instructions (Addendum)
Continuity of Care Form    Patient Name: Carlos Petersen   DOB:  06-24-1951  MRN:  [id number]    Admit date:  01/26/2022  Discharge date:  02/01/2022    Code Status Order: Full Code   Advance Directives:     Admitting Physician:  Cheryln Manly, MD  PCP: Herma Mering, MD    Discharging Nurse: [facility] Unit/Room#: 564/01  Discharging Unit Phone Number: [phone]    Emergency Contact:   Extended Emergency Contact Information  Primary Emergency Contact: J,Tylyn  Home Phone: [phone]  Relation: Child    Past Surgical History:  Past Surgical History:   Procedure Laterality Date    CARDIAC DEFIBRILLATOR PLACEMENT      COLONOSCOPY      PACEMAKER      PACEMAKER PLACEMENT      TOTAL HIP ARTHROPLASTY Right 01/2016       Immunization History:   Immunization History   Administered Date(s) Administered    Pneumococcal, PCV-13, PREVNAR 13, (age 6w+), IM, 0.17mL 05/06/2020       Active Problems:  Patient Active Problem List   Diagnosis Code    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    Permanent atrial fibrillation (HCC) I48.21    Acute pain of right shoulder M25.511    Major depressive disorder, recurrent, unspecified (HCC) F33.9    Tobacco dependency F17.200    Gastroesophageal reflux disease with esophagitis without hemorrhage K21.00    Depression F32.A    Fall W19.XXXA    Pacemaker Z95.0    Tobacco abuse counseling Z71.6    Mixed hyperlipidemia E78.2    Parkinson's disease (HCC) G20    Major depressive disorder, recurrent, moderate (HCC) F33.1    Major depressive disorder, recurrent, mild (HCC) F33.0    Hypertensive heart disease I11.9    Chronic renal disease, stage III (HCC) N18.30    Hypotension due to drugs I95.2    Severe obesity (BMI 35.0-39.9) with comorbidity (HCC) E66.01    PAD (peripheral artery disease) (HCC) I73.9    Hip fracture (HCC) S72.009A    AMS (altered mental status) R41.82    C. difficile colitis A04.72     Diarrhea R19.7    Diabetes (HCC) E11.9    Generalized weakness R53.1    Iron deficiency anemia D50.9       Isolation/Infection:   Isolation            Enteric Contact  C Diff Contact          Patient Infection Status       Infection Onset Added Last Indicated Last Indicated By Review Planned Expiration Resolved Resolved By    C-diff (Clostridium difficile) 01/27/22 01/27/22 01/27/22 Clostridium Difficile Toxin/Antigen 02/03/22 02/06/22      Resolved    C-diff Rule Out 01/26/22 01/26/22 01/27/22 Clostridium Difficile Toxin/Antigen (Ordered)   01/27/22 Rule-Out Test Resulted            Nurse Assessment:  Last Vital Signs: BP (!) 122/59   Pulse 75   Temp 98.2 F (36.8 C) (Oral)   Resp 17   Ht 1.803 m (5' 10.98")   Wt 95.3 kg (210 lb)   SpO2 98%   BMI 29.30 kg/m     Last documented pain score (0-10 scale): Pain Level: 0  Last Weight:   Wt Readings from Last 1 Encounters:   01/26/22 95.3 kg (210 lb)     Mental Status:  alert    IV Access:  - None    Nursing Mobility/ADLs:  Walking   Assisted  Transfer  Assisted  Bathing  Dependent  Dressing  Dependent  Toileting  Dependent  Feeding  Assisted  Med Admin  Independent  Med Delivery   whole    Wound Care Documentation and Therapy:  Wound 01/28/22 Sacrum Stage 2 (Active)   Dressing Status Clean;Dry;Intact 02/01/22 1025   Wound Cleansed Other (Comment) 01/31/22 0108   Odor None 01/31/22 2248   Number of days: 3       Incision 02/01/22 Thigh Left;Dorsal;Outer (Active)   Number of days: 0        Elimination:  Continence:   Bowel: Yes  Bladder: Yes  Urinary Catheter: None   Colostomy/Ileostomy/Ileal Conduit: No       Date of Last BM: 02/01/2022  Intake/Output Summary (Last 24 hours) at 02/01/2022 1432  Last data filed at 02/01/2022 0514  Gross per 24 hour   Intake 800 ml   Output 700 ml   Net 100 ml     I/O last 3 completed shifts:  In: 1300 [P.O.:1300]  Out: 1300 [Urine:1300]    Safety Concerns:     At Risk for Falls    Impairments/Disabilities:      None    Nutrition  Therapy:  Current Nutrition Therapy:   - Oral Diet:  General    Routes of Feeding: Oral  Liquids: Thin Liquids  Daily Fluid Restriction: no  Last Modified Barium Swallow with Video (Video Swallowing Test): not done    Treatments at the Time of Hospital Discharge:   Respiratory Treatments: ***  Oxygen Therapy:  is not on home oxygen therapy.  Ventilator:    - No ventilator support    Rehab Therapies: {THERAPEUTIC INTERVENTION:[phone]}  Weight Bearing Status/Restrictions: No weight bearing restrictions  Other Medical Equipment (for information only, NOT a DME order):  {EQUIPMENT:304520077}  Other Treatments: ***    Patient's personal belongings (please select all that are sent with patient):  Sent with the patient    RN SIGNATURE:  Electronically signed by Loren Racer, RN on 02/01/22 at 2:36 PM EDT    CASE MANAGEMENT/SOCIAL WORK SECTION    Inpatient Status Date: ***    Readmission Risk Assessment Score:  Readmission Risk              Risk of Unplanned Readmission:  0           Discharging to Facility/ Agency   Name:   Address:  Phone:  Fax:    Dialysis Facility (if applicable)   Name:  Address:  Dialysis Schedule:  Phone:  Fax:    Case Manager/Social Worker signature: {Esignature:304088025}    PHYSICIAN SECTION    Prognosis: {Prognosis:[phone]}    Condition at Discharge: {MH Patient Condition:304088024}    Rehab Potential (if transferring to Rehab): {Prognosis:[phone]}    Recommended Labs or Other Treatments After Discharge: ***    Physician Certification: I certify the above information and transfer of ELFORD EVILSIZER  is necessary for the continuing treatment of the diagnosis listed and that he requires {Admit to Appropriate Level of Care:20763} for {GREATER/LESS:304500278} 30 days.     Update Admission H&P: {CHP DME Changes in XBLTJ:030092330}    PHYSICIAN SIGNATURE:  {Esignature:304088025}

## 2022-02-03 NOTE — Care Coordination-Inpatient (Signed)
[facility] Care Transitions Initial Follow Up Call    Call within 2 business days of discharge: Yes    Patient Current Location:  [place]    Care Transition Nurse contacted the family by telephone to perform post hospital discharge assessment. Verified name and DOB with family as identifiers. Provided introduction to self, and explanation of the Care Transition Nurse role.     Patient: Carlos Petersen Patient DOB: 1951-02-07   MRN: [id number]  Reason for Admission: diarrhea  Discharge Date: 02/01/22 RARS: No data recorded    Last Discharge [facility]       Date Complaint Diagnosis Description Type Department Provider    01/26/22 Diarrhea Diarrhea, unspecified type ... ED to Hosp-Admission (Discharged) (ADMITTED) [id number] Demetria Pore, MD; Lytle Michaels...            Was this an external facility discharge? No     Challenges to be reviewed by the provider   Additional needs identified to be addressed with provider: Yes  Family declined SNF  Son reports patient continues with diarrhea multiple times per day and he is going to [facility] hospital. Agrees for CTN to call next week.           Method of communication with provider: chart routing.      Care Transition Nurse reviewed discharge instructions, medical action plan, and red flags with family who verbalized understanding. The family was given an opportunity to ask questions and does not have any further questions or concerns at this time. Were discharge instructions available to patient? Yes. Reviewed appropriate site of care based on symptoms and resources available to patient including: PCP  Specialist  Home health. The family agrees to contact the PCP office for questions related to their healthcare.     Advance Care Planning:   Does patient have an Advance Directive: not on [employer]it deferred    Was patient discharged with a pulse oximeter? no    Non-face-to-face services provided:  Obtained and reviewed discharge summary and/or continuity of care  documents    Offered patient enrollment in the Remote Patient Monitoring (RPM) program for in-home monitoring: NA.    Care Transitions 24 Hour Call    Post Acute Services: Home Health  Do you have support at home?: Child  Are you an active caregiver in your home?: No  Care Transitions Interventions         Discussed follow-up appointments. If no appointment was previously scheduled, appointment scheduling offered: No. Will schedule after [facility] discharge  Is follow up appointment scheduled within 7 days of discharge? No.    Follow Up  Future Appointments   Date Time Provider Department Center   02/26/2022  3:00 PM Smith Mince, MD [facility]       Care Transition Nurse provided contact information.  Plan for follow-up call in 5-7 days based on severity of symptoms and risk factors.  Plan for next call:  discharge from [facility]    Toniann Ket, RN

## 2022-02-05 NOTE — Telephone Encounter (Signed)
Called and spoke to son. Pt is not in hospital. He says he seems to be doing ok.

## 2022-02-08 ENCOUNTER — Telehealth

## 2022-02-08 NOTE — Telephone Encounter (Signed)
Patient's son called and stated that his Dad has cdiff and still has loose bowel moments. At least 15 times a day. He would like something sent in to [facility] to help with this matter.

## 2022-02-09 ENCOUNTER — Encounter: Payer: MEDICARE | Primary: Internal Medicine

## 2022-02-10 ENCOUNTER — Encounter

## 2022-02-11 NOTE — Telephone Encounter (Signed)
Carlos Petersen was notified Dr. Gaynelle Cage will accept the pt since she is pcp.

## 2022-02-11 NOTE — Telephone Encounter (Signed)
Carlos Petersen from [facility] wants to know if Dr. Gaynelle Cage would follow for patient for homehealth.    [phone]

## 2022-02-12 ENCOUNTER — Encounter

## 2022-02-12 NOTE — Telephone Encounter (Signed)
-----   Message from Herma Mering, MD sent at 02/12/2022 11:38 AM EDT -----  Regarding: RE: Diarrhea   Contact: [phone]  Added to the message I sent to you on 6/14 as I just discussed with you, he needs to do in the lab today labs, can you please place orders for cbc, cmp, in addition to the c. Diff test I already ordered, we need to make him an urgent appointment with gastroenterology no later than next week.  Please ask him first if he has an appointment set up and with whom in that case, when, if not close, then let's try to schedule one close. Let me know also in that case who that is so I can try to communicate directly with such gastroenterologist.   Please ask him if he is feeling weak, somnolent, short of breath, having palpitations or chest pains or if there is any blood in the stools, any of these symptoms are indications to go to the ER immediately.  If not then please advise him to take 1/2 to 1 cup of pedialyte for every diarrhea he has and at least 60 oz of water a day.  ----- Message -----  From: Deborra Medina, MA  Sent: 02/08/2022   3:58 PM EDT  To: Herma Mering, MD  Subject: RE: Diarrhea                                     Please advise     ----- Message -----  From: Carlos Petersen  Sent: 02/08/2022  12:01 PM EDT  To: , #  Subject: Diarrhea                                         I still have very loose and persistent diarrhea it won't stop. I'm getting weaker everyday and having loose stools 8 to 12 times daily. I can't control it it just runs out. I have been on antibiotics for more than two weeks now for C-dif please tell me what I can do

## 2022-02-12 NOTE — Addendum Note (Signed)
Addended byOtilio Connors on: 02/12/2022 02:23 PM     Modules accepted: Orders

## 2022-02-12 NOTE — Telephone Encounter (Signed)
Called pt at home number [phone], and [phone]. Left messages at each number to please to office for blood work and repeat c-dif specimen.   Also drink 1/2 to 1 cup of pedialyte for each stool he has and drink at least 60 oz of water every day. He must stay hydrated. (Message copy and pasted from mychart.)  > he needs to do in the lab today labs, can you please place orders for cbc, cmp, in addition to the c. Diff test I already ordered, we need to make him an urgent appointment with gastroenterology no later than next week.  Please ask him first if he has an appointment set up and with whom in that case, when, if not close, then let's try to schedule one close. Let me know also in that case who that is so I can try to communicate directly with such gastroenterologist.   Please ask him if he is feeling weak, somnolent, short of breath, having palpitations or chest pains or if there is any blood in the stools, any of these symptoms are indications to go to the ER immediately.  If not then please advise him to take 1/2 to 1 cup of pedialyte for every diarrhea he has and at least 60 oz of water a day.   This message was left on answering machine,.

## 2022-02-12 NOTE — Telephone Encounter (Signed)
Attempted to  call pt on both numbers listed in chart, pt home and son. Also left message on Mychart.

## 2022-02-16 NOTE — Care Coordination-Inpatient (Signed)
Patient called on home/mobile number on file. Message left on voicemail with contact information to return call to this writer.

## 2022-02-17 NOTE — Care Coordination-Inpatient (Signed)
[facility] Care Transitions Follow Up Call    Patient Current Location:  [place]    Care Transition Nurse contacted the family by telephone to follow up after admission on 01/26/22.  Verified name and DOB with family as identifiers.    Patient: Carlos Petersen  Patient DOB: 1951/08/17   MRN: [id number]  Reason for Admission: diarrhea  Discharge Date: 02/01/22 RARS: No data recorded    Needs to be reviewed by the provider   Additional needs identified to be addressed with provider: No         Method of communication with provider: none.    Son reports patient is able to get up and move around. Denies diarrhea, SOB, chest pain.endorse eating and drinking with no issues.    Addressed changes since last contact:    Discussed follow-up appointments. If no appointment was previously scheduled, appointment scheduling offered: No.   Is follow up appointment scheduled within 7 days of discharge? No.    Follow Up  Future Appointments   Date Time Provider Department Center   02/26/2022  3:00 PM Smith Mince, MD [facility] AM[facility]     Non-[facility] follow up appointment(s): none    Care Transition Nurse reviewed red flags with family and discussed any barriers to care and/or understanding of plan of care after discharge. Discussed appropriate site of care based on symptoms and resources available to patient including: PCP  Specialist  Condition related references. The family agrees to contact the PCP office for questions related to their healthcare.       Care Transitions Subsequent and Final Call    Subsequent and Final Calls  Do you have any ongoing symptoms?: No  Have your medications changed?: No  Do you have any questions related to your medications?: No  Are you currently active with any services?: Home Health  Do you have any needs or concerns that I can assist you with?: No  Care Transitions Interventions  Other Interventions:             Care Transition Nurse provided contact information for future needs. Plan for follow-up call in 7-10  days based on severity of symptoms and risk factors.  Plan for next call: follow-up appointment-GI appt made/attended    Toniann Ket, RN

## 2022-02-24 ENCOUNTER — Encounter

## 2022-02-24 NOTE — Care Coordination-Inpatient (Signed)
[facility] Care Transitions Follow Up Call    Patient Current Location:  [place]    Care Transition Nurse contacted the family by telephone to follow up after admission on 01/26/22.  Verified name and DOB with family as identifiers.    Patient: Carlos Petersen  Patient DOB: 1950-09-02   MRN: [id number]  Reason for Admission: diarrhea  Discharge Date: 02/01/22 RARS: No data recorded    Needs to be reviewed by the provider   Additional needs identified to be addressed with provider: No       Method of communication with provider: none.    Son reports patient is now having solid stools. Denies n/v/d, SOB chest pain.    Addressed changes since last contact:    Discussed follow-up appointments. If no appointment was previously scheduled, appointment scheduling offered: No.   Is follow up appointment scheduled within 7 days of discharge? No.    Follow Up  Future Appointments   Date Time Provider Department Center   02/26/2022  3:00 PM Smith Mince, MD [facility] AMB     Non-[facility] follow up appointment(s): none    Care Transition Nurse reviewed red flags with family and discussed any barriers to care and/or understanding of plan of care after discharge. Discussed appropriate site of care based on symptoms and resources available to patient including: PCP  Specialist. The family agrees to contact the PCP office for questions related to their healthcare.     Care Transitions Subsequent and Final Call    Subsequent and Final Calls  Do you have any ongoing symptoms?: No  Have your medications changed?: No  Do you have any questions related to your medications?: No  Are you currently active with any services?: Home Health  Do you have any needs or concerns that I can assist you with?: No  Care Transitions Interventions  Other Interventions:             Care Transition Nurse provided contact information for future needs. Plan for follow-up call in 7-10 days based on severity of symptoms and risk factors.  Plan for next call:  resolve  episode    Toniann Ket, RN

## 2022-02-26 ENCOUNTER — Encounter: Payer: MEDICARE | Primary: Internal Medicine

## 2022-03-04 NOTE — Care Coordination-Inpatient (Signed)
Patient has graduated from the Transition of Care Coordination program on 03/04/22.  Patient/family has the ability to self-manage at this time Care management goals have been completed. Patient was not referred to the ACM team for further management.     Patient has Care Transition Nurse's contact information for any further questions, concerns, or needs.  Patients upcoming visits:  No future appointments.

## 2022-04-05 ENCOUNTER — Inpatient Hospital Stay
Admission: EM | Admit: 2022-04-05 | Discharge: 2022-04-12 | Disposition: A | Discharge status: Skilled Nursing Facility | Payer: MEDICARE | Admitting: Internal Medicine

## 2022-04-05 ENCOUNTER — Emergency Department: Admit: 2022-04-05 | Payer: MEDICARE | Primary: Internal Medicine

## 2022-04-05 DIAGNOSIS — D649 Anemia, unspecified: Secondary | ICD-10-CM

## 2022-04-05 DIAGNOSIS — D61818 Other pancytopenia: Secondary | ICD-10-CM

## 2022-04-05 LAB — CBC WITH AUTO DIFFERENTIAL
Absolute Immature Granulocyte: 0.1 10*3/uL — ABNORMAL HIGH (ref 0.00–0.04)
Basophils %: 0 % (ref 0–1)
Basophils Absolute: 0 10*3/uL (ref 0.0–0.1)
Eosinophils %: 0 % (ref 0–7)
Eosinophils Absolute: 0 10*3/uL (ref 0.0–0.4)
Hematocrit: 19.2 % — ABNORMAL LOW (ref 36.6–50.3)
Hemoglobin: 5.9 g/dL — CL (ref 12.1–17.0)
Immature Granulocytes: 1 % — ABNORMAL HIGH (ref 0–0.5)
Lymphocytes %: 25 % (ref 12–49)
Lymphocytes Absolute: 1 10*3/uL (ref 0.8–3.5)
MCH: 22.6 PG — ABNORMAL LOW (ref 26.0–34.0)
MCHC: 30.7 g/dL (ref 30.0–36.5)
MCV: 73.6 FL — ABNORMAL LOW (ref 80.0–99.0)
MPV: 9.7 FL (ref 8.9–12.9)
Monocytes %: 17 % — ABNORMAL HIGH (ref 5–13)
Monocytes Absolute: 0.7 10*3/uL (ref 0.0–1.0)
Neutrophils %: 57 % (ref 32–75)
Neutrophils Absolute: 2.2 10*3/uL (ref 1.8–8.0)
Nucleated RBCs: 0 PER 100 WBC
Platelets: 98 10*3/uL — ABNORMAL LOW (ref 150–400)
RBC: 2.61 M/uL — ABNORMAL LOW (ref 4.10–5.70)
RDW: 17.5 % — ABNORMAL HIGH (ref 11.5–14.5)
WBC: 3.9 10*3/uL — ABNORMAL LOW (ref 4.1–11.1)
nRBC: 0 10*3/uL (ref 0.00–0.01)

## 2022-04-05 LAB — COMPREHENSIVE METABOLIC PANEL
ALT: <6 U/L — ABNORMAL LOW (ref 12–78)
AST: 19 U/L (ref 15–37)
Albumin/Globulin Ratio: 0.4 — ABNORMAL LOW (ref 1.1–2.2)
Albumin: 2 g/dL — ABNORMAL LOW (ref 3.5–5.0)
Alk Phosphatase: 61 U/L (ref 45–117)
Anion Gap: 8 mmol/L (ref 5–15)
BUN: 15 mg/dL (ref 6–20)
Bun/Cre Ratio: 20 (ref 12–20)
CO2: 23 mmol/L (ref 21–32)
Calcium: 7.6 mg/dL — ABNORMAL LOW (ref 8.5–10.1)
Chloride: 102 mmol/L (ref 97–108)
Creatinine: 0.74 mg/dL (ref 0.70–1.30)
Est, Glom Filt Rate: >60 mL/min/{1.73_m2} (ref >60)
Globulin: 5.3 g/dL — ABNORMAL HIGH (ref 2.0–4.0)
Glucose: 102 mg/dL — ABNORMAL HIGH (ref 65–100)
Potassium: 3.2 mmol/L — ABNORMAL LOW (ref 3.5–5.1)
Sodium: 133 mmol/L — ABNORMAL LOW (ref 136–145)
Total Bilirubin: 0.7 mg/dL (ref 0.2–1.0)
Total Protein: 7.3 g/dL (ref 6.4–8.2)

## 2022-04-05 LAB — LIPASE: Lipase: 83 U/L (ref 73–393)

## 2022-04-05 MED ORDER — ALBUTEROL SULFATE HFA 108 (90 BASE) MCG/ACT IN AERS
108 (90 Base) MCG/ACT | Freq: Four times a day (QID) | RESPIRATORY_TRACT | Status: AC | PRN
Start: 2022-04-05 — End: 2022-04-12

## 2022-04-05 MED ORDER — BUDESONIDE-FORMOTEROL FUMARATE 160-4.5 MCG/ACT IN AERO
Freq: Two times a day (BID) | RESPIRATORY_TRACT | Status: AC
Start: 2022-04-05 — End: 2022-04-12
  Administered 2022-04-06 – 2022-04-12 (×13): 2 via RESPIRATORY_TRACT

## 2022-04-05 MED ORDER — PANTOPRAZOLE SODIUM 40 MG IV SOLR
40 MG | INTRAVENOUS | Status: AC
Start: 2022-04-05 — End: 2022-04-05
  Administered 2022-04-05: 21:00:00 80 mg via INTRAVENOUS

## 2022-04-05 MED ORDER — SODIUM CHLORIDE 0.9 % IV SOLN
0.9 | INTRAVENOUS | Status: DC | PRN
Start: 2022-04-05 — End: 2022-04-12

## 2022-04-05 MED ORDER — VORTIOXETINE HBR 20 MG PO TABS
20 MG | Freq: Every day | ORAL | Status: AC
Start: 2022-04-05 — End: 2022-04-12

## 2022-04-05 MED ORDER — ONDANSETRON HCL 4 MG/2ML IJ SOLN
4 MG/2ML | Freq: Four times a day (QID) | INTRAMUSCULAR | Status: AC | PRN
Start: 2022-04-05 — End: 2022-04-12
  Administered 2022-04-08 – 2022-04-11 (×3): 4 mg via INTRAVENOUS

## 2022-04-05 MED ORDER — ONDANSETRON 4 MG PO TBDP
4 MG | Freq: Three times a day (TID) | ORAL | Status: AC | PRN
Start: 2022-04-05 — End: 2022-04-12

## 2022-04-05 MED ORDER — CARBIDOPA-LEVODOPA ER 25-100 MG PO TBCR
25-100 MG | Freq: Three times a day (TID) | ORAL | Status: AC
Start: 2022-04-05 — End: 2022-04-12
  Administered 2022-04-05 – 2022-04-12 (×21): 4 via ORAL

## 2022-04-05 MED ORDER — FERROUS SULFATE 325 (65 FE) MG PO TABS
325 (65 Fe) MG | Freq: Every day | ORAL | Status: AC
Start: 2022-04-05 — End: 2022-04-11
  Administered 2022-04-06 – 2022-04-11 (×5): 325 mg via ORAL

## 2022-04-05 MED ORDER — POTASSIUM CHLORIDE 20 MEQ PO PACK
20 MEQ | ORAL | Status: AC
Start: 2022-04-05 — End: 2022-04-05
  Administered 2022-04-05 – 2022-04-06 (×2): 40 meq via ORAL

## 2022-04-05 MED ORDER — POLYETHYLENE GLYCOL 3350 17 G PO PACK
17 g | Freq: Every day | ORAL | Status: AC | PRN
Start: 2022-04-05 — End: 2022-04-12

## 2022-04-05 MED ORDER — GABAPENTIN 100 MG PO CAPS
100 MG | Freq: Two times a day (BID) | ORAL | Status: AC
Start: 2022-04-05 — End: 2022-04-12
  Administered 2022-04-06 – 2022-04-12 (×13): 100 mg via ORAL

## 2022-04-05 MED ORDER — FLUTICASONE-UMECLIDIN-VILANT 100-62.5-25 MCG/ACT IN AEPB
Freq: Every day | RESPIRATORY_TRACT | Status: DC
Start: 2022-04-05 — End: 2022-04-05

## 2022-04-05 MED ORDER — SODIUM CHLORIDE 0.9 % IV SOLN
0.9 % | INTRAVENOUS | Status: AC | PRN
Start: 2022-04-05 — End: 2022-04-12

## 2022-04-05 MED ORDER — NORMAL SALINE FLUSH 0.9 % IV SOLN
0.9 % | INTRAVENOUS | Status: DC | PRN
Start: 2022-04-05 — End: 2022-04-12

## 2022-04-05 MED ORDER — ACETAMINOPHEN 325 MG PO TABS
325 | Freq: Four times a day (QID) | ORAL | Status: DC | PRN
Start: 2022-04-05 — End: 2022-04-12

## 2022-04-05 MED ORDER — IOPAMIDOL 76 % IV SOLN
76 % | Freq: Once | INTRAVENOUS | Status: AC | PRN
Start: 2022-04-05 — End: 2022-04-05
  Administered 2022-04-05: 20:00:00 100 mL via INTRAVENOUS

## 2022-04-05 MED ORDER — RANOLAZINE ER 500 MG PO TB12
500 MG | Freq: Two times a day (BID) | ORAL | Status: AC
Start: 2022-04-05 — End: 2022-04-12
  Administered 2022-04-06 – 2022-04-12 (×7): 500 mg via ORAL

## 2022-04-05 MED ORDER — NORMAL SALINE FLUSH 0.9 % IV SOLN
0.9 % | Freq: Two times a day (BID) | INTRAVENOUS | Status: AC
Start: 2022-04-05 — End: 2022-04-12
  Administered 2022-04-06 – 2022-04-12 (×14): 10 mL via INTRAVENOUS

## 2022-04-05 MED ORDER — TIOTROPIUM BROMIDE MONOHYDRATE 2.5 MCG/ACT IN AERS
2.5 | Freq: Every day | RESPIRATORY_TRACT | Status: DC
Start: 2022-04-05 — End: 2022-04-12
  Administered 2022-04-07 – 2022-04-12 (×6): 2 via RESPIRATORY_TRACT

## 2022-04-05 MED ORDER — AMIODARONE HCL 200 MG PO TABS
200 MG | Freq: Every day | ORAL | Status: AC
Start: 2022-04-05 — End: 2022-04-12
  Administered 2022-04-05 – 2022-04-12 (×8): 200 mg via ORAL

## 2022-04-05 MED ORDER — ACETAMINOPHEN 650 MG RE SUPP
650 | Freq: Four times a day (QID) | RECTAL | Status: DC | PRN
Start: 2022-04-05 — End: 2022-04-12

## 2022-04-05 MED ORDER — PANTOPRAZOLE SODIUM 40 MG IV SOLR
40 MG | Freq: Two times a day (BID) | INTRAVENOUS | Status: AC
Start: 2022-04-05 — End: 2022-04-08
  Administered 2022-04-06 – 2022-04-08 (×6): 40 mg via INTRAVENOUS

## 2022-04-05 MED ORDER — ROSUVASTATIN CALCIUM 20 MG PO TABS
20 MG | Freq: Every evening | ORAL | Status: AC
Start: 2022-04-05 — End: 2022-04-12
  Administered 2022-04-06 – 2022-04-12 (×7): 20 mg via ORAL

## 2022-04-05 MED FILL — ISOVUE-370 76 % IV SOLN: 76 % | INTRAVENOUS | Qty: 100

## 2022-04-05 MED FILL — CARBIDOPA-LEVODOPA ER 25-100 MG PO TBCR: 25-100 MG | ORAL | Qty: 4

## 2022-04-05 MED FILL — SODIUM CHLORIDE FLUSH 0.9 % IV SOLN: 0.9 % | INTRAVENOUS | Qty: 40

## 2022-04-05 MED FILL — ROSUVASTATIN CALCIUM 20 MG PO TABS: 20 MG | ORAL | Qty: 1

## 2022-04-05 MED FILL — RANOLAZINE ER 500 MG PO TB12: 500 MG | ORAL | Qty: 1

## 2022-04-05 NOTE — Care Coordination-Inpatient (Signed)
04/05/22 1733   Service Assessment   Patient Orientation Alert and Oriented   Cognition Alert   History Provided By Patient   Primary Caregiver Self   Accompanied By/Relationship Pt alone on interview. CM spoke with pt & son Linus Weckerly) via pt's speaker phone.   [place] is: Scientist, research (physical sciences) Next of Kin   PCP Verified by CM Yes  Luanna Salk [place] - seen a year ago.)   Last Visit to PCP Within last two years   Prior Functional Level Assistance with the following:;Bathing;Dressing;Housework;Shopping;Mobility;Toileting  (Uses walker/electric w/c.)   Current Functional Level Assistance with the following:;Bathing;Dressing;Toileting;Housework;Shopping;Mobility  (Uses electric w/c/walker.)   Can patient return to prior living arrangement Yes  (Son requesting SNF.)   Ability to make needs known: Fair   Family able to assist with home care needs: Yes   Would you like for me to discuss the discharge plan with any other family members/significant others, and if so, who? Yes  (Son Deaaron Fulghum)   Financial Resources [employer] Resources None   Social/Functional History   Lives With Son   Type of [place] One level   Home Access Level entry   Wheeler unit   Chemical engineer, standard;Wheelchair-electric   Receives Help From Family   ADL Assistance Needs assistance   Toileting Needs assistance   [place] Needs assistance   Homemaking Responsibilities Yes   Ambulation Assistance Needs assistance  (Walker/electric w/c.)   Transfer Assistance Needs assistance   Active Driver No   Occupation Retired   Dentist   Type of [place]  (Pt signed Choice Letter for SNF/referral sent/son aware.)   Living Arrangements Children  (Lives with son.)   Current Services Prior To Admission None   Potential  Assistance Needed N/A   DME Ordered? No   Potential Assistance Purchasing Medications No   Type of Home Care Services None   Patient expects to be discharged to: Skilled nursing facility  (Son/pt requesting [facility].)   One/Two Story Residence One story   History of falls? 0   Services At/After Discharge   Transition of Care Consult (CM Consult) Discharge Planning;SNF;Administrator, sports SNF No   Reason Why Partner SNF Not Chosen Location  (Son chose - close to home.)   [facility] [place] Discharge Transport;Skilled [place] (SNF)   [place] Provided? No   Mode of Transport at Discharge BLS   Confirm Follow Up Transport Other (see comment)     CM met with pt & spoke with son Cordarius Benning) via pt's cell phone ( pt was on the phone with son). Pt/son requesting [facility] upon discharge - pt signed Choice Letter ( son aware) - referral sent via [place]. Pt uses walker, electric w/c,  & uses [facility] for [place]. Pt will need transport to facility if accepted.

## 2022-04-05 NOTE — H&P (Signed)
Hospitalist Admission Note    NAME:   Carlos Petersen   DOB: May 25, 1951   MRN: [id number]     Date/Time: 04/05/2022 4:32 PM    Patient PCP: Gaetano Net, MD    ______________________________________________________________________  Given the patient's current clinical presentation, I have a high level of concern for decompensation if discharged from the emergency department.  Complex decision making was performed, which includes reviewing the patient's available past medical records, laboratory results, and x-ray films.       My assessment of this patient's clinical condition and my plan of care is as follows.    Assessment / Plan:    Acute blood loss anemia/GI bleed-patient presents with department symptomatology based on patient's clinical presentation consistent with acute blood loss anemia/GI bleed, patient mains hemodynamically stable at this time  Maintain active type and screen  Transfuse 2 units packed RBCs  Hold home anticoagulation  Obtain posttransfusion CBC to assess for appropriate response  Protonix 40 mg IV twice daily  Clear liquid diet, n.p.o. after midnight  Gastroenterology consult further evaluation    Hypokalemia-replete potassium and recheck potassium    Hyperlipidemia-continue statin    History of venous thromboembolism-currently holding anticoagulation    History of atrial fibrillation-continue medications, hold home anticoagulation    Peripheral neuropathy-continue current medications    Prophylaxis-holding home anticoagulation  FEN-clear liquid diet, n.p.o. after midnight, replete potassium and magnesium  Full code, will clarify about surrogate decision-maker, admitted further medical management                    Medical Decision Making:   I personally reviewed labs: CBC, CMP, magnesium  I personally reviewed imaging: CT abdomen pelvis, agree with official read  I personally reviewed EKG:  Toxic drug monitoring: Amiodarone, monitoring for hepatotoxicity, trending LFTs  Discussed  case with: ED provider. After discussion I am in agreement that acuity of patient's medical condition necessitates hospital stay.      Code Status: Full code  DVT Prophylaxis: Holding home anticoagulation  GI Prophylaxis: None  Baseline: Independent with ADLs    Subjective:   CHIEF COMPLAINT: Weakness/dizziness    HISTORY OF PRESENT ILLNESS:     Carlos Petersen is a 71 y.o.  male with PMHx significant multiple comorbidities presents [facility] with complaints of weakness/dizziness.  Patient states over the past few days has been having gradual onset persistent progressive generalized weakness as well as dizziness resulting in a limitation in ambulation following which patient presented to the ED.  Of note patient is on blood thinners for blood clots.  Patient denies any fevers chills nausea vomiting orthopnea paroxysmal nocturnal dyspnea chest pain palpitations headache focal weakness loss sensation auditory or visual symptoms abdominal or urinary complaints or any other associated symptoms.  Patient endorses no recent sick contacts or travel activity  We were asked to admit for work up and evaluation of the above problems.     Past Medical History:   Diagnosis Date    Cerebral artery occlusion with cerebral infarction [facility]     COPD (chronic obstructive pulmonary disease) (HCC)     Depression     Diabetes ([place])     Emphysema lung (HCC)     Heart disease     Hypercholesterolemia     Parkinson disease ([place])     Pulmonary embolism ([place])         Past Surgical History:   Procedure Laterality Date    CARDIAC DEFIBRILLATOR PLACEMENT  COLONOSCOPY      PACEMAKER      PACEMAKER PLACEMENT      TOTAL HIP ARTHROPLASTY Right 01/2016       Social History     Tobacco Use    Smoking status: Every Day     Packs/day: 0.50     Types: Cigarettes    Smokeless tobacco: Never   Substance Use Topics    Alcohol use: Never        History reviewed. No pertinent family history.    No Known Allergies     Prior to  Admission medications    Medication Sig Start Date End Date Taking? Authorizing Provider   ferrous sulfate (IRON 325) 325 (65 Fe) MG tablet Take 1 tablet by mouth daily (with breakfast) 02/01/22 03/03/22  Jan A Zarefoss, APRN - NP   cholestyramine Lucrezia Starch) 4 g packet Take 1 packet by mouth 3 times daily (with meals) for 7 days 01/27/22 02/03/22  Emersynn Deatley I Moreen Fowler, MD   VORTIoxetine HBr (TRINTELLIX) 20 MG TABS tablet Take 1 tablet by mouth daily    Historical Provider, MD   fluticasone-umeclidin-vilant (TRELEGY ELLIPTA) 100-62.5-25 MCG/ACT AEPB inhaler Inhale 1 puff into the lungs daily 11/17/21   Ar Automatic Reconciliation   alendronate (FOSAMAX) 70 MG tablet Take 1 tablet by mouth every 7 days On Wednesday    Historical Provider, MD   albuterol sulfate HFA (PROVENTIL;VENTOLIN;PROAIR) 108 (90 Base) MCG/ACT inhaler Inhale 2 puffs into the lungs every 6 hours as needed    Historical Provider, MD   amiodarone (CORDARONE) 200 MG tablet Take 1 tablet by mouth daily 11/17/21   Historical Provider, MD   apixaban (ELIQUIS) 5 MG TABS tablet Take 1 tablet by mouth 2 times daily 11/17/21   Historical Provider, MD   carbidopa-levodopa (SINEMET CR) 50-200 MG per extended release tablet Take 2 tablets by mouth 3 times daily 11/17/21 11/12/22  Historical Provider, MD   carvedilol (COREG) 6.25 MG tablet Take 1 tablet by mouth 2 times daily 11/17/21   Historical Provider, MD   JARDIANCE 10 MG tablet Take 1 tablet by mouth daily 11/18/21   Historical Provider, MD   gabapentin (NEURONTIN) 100 MG capsule Take 1 capsule by mouth in the morning and at bedtime. 12/23/21   Historical Provider, MD   omeprazole (PRILOSEC) 40 MG delayed release capsule Take 1 capsule by mouth daily    Historical Provider, MD   ranolazine (RANEXA) 500 MG extended release tablet Take 1 tablet by mouth 2 times daily 11/27/21   Historical Provider, MD   rosuvastatin (CRESTOR) 20 MG tablet Take 1 tablet by mouth 11/27/21 11/22/22  Historical Provider, MD         Objective:    VITALS:    Patient Vitals for the past 24 hrs:   BP Temp Temp src Pulse Resp SpO2 Height Weight   04/05/22 1601 (!) 105/51 98.2 F (36.8 C) Oral 82 15 98 % -- --   04/05/22 1551 (!) 103/50 98.1 F (36.7 C) Oral 81 15 97 % -- --   04/05/22 1543 (!) 106/52 98.4 F (36.9 C) -- 83 20 99 % -- --   04/05/22 1515 -- -- -- -- 19 -- -- --   04/05/22 1500 -- -- -- -- -- 97 % -- --   04/05/22 1445 -- -- -- -- -- 99 % -- --   04/05/22 1430 -- -- -- -- -- 98 % -- --   04/05/22 1415 -- -- -- -- --  99 % -- --   04/05/22 1400 -- -- -- -- -- 99 % -- --   04/05/22 1345 -- -- -- -- -- 96 % -- --   04/05/22 1330 -- -- -- -- -- 95 % -- --   04/05/22 1315 -- -- -- -- -- 95 % -- --   04/05/22 1300 -- -- -- -- -- 96 % -- --   04/05/22 1258 (!) 102/52 98.4 F (36.9 C) Oral 95 15 97 % 1.803 m ('5\' 11"' ) 95.3 kg (210 lb)       Temp (24hrs), Avg:98.3 F (36.8 C), Min:98.1 F (36.7 C), Max:98.4 F (36.9 C)        O2 Device: None (Room air)    Wt Readings from Last 12 Encounters:   04/05/22 95.3 kg (210 lb)   01/26/22 95.3 kg (210 lb)   01/22/22 90.7 kg (200 lb)   01/15/22 90.7 kg (200 lb)   11/19/21 104.3 kg (230 lb)   05/18/21 122.9 kg (271 lb)   01/22/21 107.2 kg (236 lb 6.4 oz)   05/06/20 108.8 kg (239 lb 12.8 oz)         PHYSICAL EXAM:  General:    Alert, cooperative, appears stated age.     Lungs:   CTA b/l.  No wheezing or Rhonchi. No rales.  Chest wall:  No tenderness.  No accessory muscle use.  Heart:   Regular  rhythm,  No  Murmur.   No edema  Abdomen:   Soft, NT. ND  BS+  Extremities: No cyanosis.  No clubbing,      Skin turgor normal, Radial dial pulse 2+. Capillary refill normal  Neurologic: No facial asymmetry. No aphasia or slurred speech. Symmetrical strength, Sensation grossly intact. AAOx4.         LAB DATA REVIEWED:    Recent Results (from the past 12 hour(s))   CBC with Auto Differential    Collection Time: 04/05/22  1:06 PM   Result Value Ref Range    WBC 3.9 (L) 4.1 - 11.1 K/uL    RBC 2.61 (L) 4.10 - 5.70 M/uL     Hemoglobin 5.9 (LL) 12.1 - 17.0 g/dL    Hematocrit 19.2 (L) 36.6 - 50.3 %    MCV 73.6 (L) 80.0 - 99.0 FL    MCH 22.6 (L) 26.0 - 34.0 PG    MCHC 30.7 30.0 - 36.5 g/dL    RDW 17.5 (H) 11.5 - 14.5 %    Platelets 98 (L) 150 - 400 K/uL    MPV 9.7 8.9 - 12.9 FL    Nucleated RBCs 0.0 0.0 PER 100 WBC    nRBC 0.00 0.00 - 0.01 K/uL    Neutrophils % 57 32 - 75 %    Lymphocytes % 25 12 - 49 %    Monocytes % 17 (H) 5 - 13 %    Eosinophils % 0 0 - 7 %    Basophils % 0 0 - 1 %    Immature Granulocytes 1 (H) 0 - 0.5 %    Neutrophils Absolute 2.2 1.8 - 8.0 K/UL    Lymphocytes Absolute 1.0 0.8 - 3.5 K/UL    Monocytes Absolute 0.7 0.0 - 1.0 K/UL    Eosinophils Absolute 0.0 0.0 - 0.4 K/UL    Basophils Absolute 0.0 0.0 - 0.1 K/UL    Absolute Immature Granulocyte 0.1 (H) 0.00 - 0.04 K/UL    Differential Type AUTOMATED     Comprehensive Metabolic Panel  Collection Time: 04/05/22  1:06 PM   Result Value Ref Range    Sodium 133 (L) 136 - 145 mmol/L    Potassium 3.2 (L) 3.5 - 5.1 mmol/L    Chloride 102 97 - 108 mmol/L    CO2 23 21 - 32 mmol/L    Anion Gap 8 5 - 15 mmol/L    Glucose 102 (H) 65 - 100 mg/dL    BUN 15 6 - 20 mg/dL    Creatinine 0.74 0.70 - 1.30 mg/dL    Bun/Cre Ratio 20 12 - 20      Est, Glom Filt Rate >60 >60 ml/min/1.82m    Calcium 7.6 (L) 8.5 - 10.1 mg/dL    Total Bilirubin 0.7 0.2 - 1.0 mg/dL    AST 19 15 - 37 U/L    ALT <6 (L) 12 - 78 U/L    Alk Phosphatase 61 45 - 117 U/L    Total Protein 7.3 6.4 - 8.2 g/dL    Albumin 2.0 (L) 3.5 - 5.0 g/dL    Globulin 5.3 (H) 2.0 - 4.0 g/dL    Albumin/Globulin Ratio 0.4 (L) 1.1 - 2.2     TYPE AND SCREEN    Collection Time: 04/05/22  1:06 PM   Result Value Ref Range    Crossmatch expiration date 04/08/2022,2359     ABO/Rh A Negative     Antibody Screen Negative     Unit Number [id number]    Product Code Blood Bank RC LR,2     Unit Divison 00     Dispense Status Blood Bank Allocated     Transfusion Status Ok to transfuse     Crossmatch Result Compatible     Unit Number [id number]     Product Code Blood Bank RC LR,1     Unit Divison 00     Dispense Status Blood Bank Issued     Transfusion Status Ok to transfuse     Crossmatch Result Compatible    Lipase    Collection Time: 04/05/22  1:11 PM   Result Value Ref Range    Lipase 83 73 - 393 U/L         CT ABDOMEN PELVIS W IV CONTRAST Additional Contrast? None    Result Date: 04/05/2022  CLINICAL HISTORY: abd pain, vomiting weakness INDICATION: abd pain, vomiting weakness COMPARISON: None. CONTRAST: 100  ml Isovue 370 TECHNIQUE: Multislice helical CT was performed of the abdomen and pelvis following uneventful rapid bolus intravenous contrast administration.  Oral contrast was not administered. Contiguous 5 mm axial images were reconstructed and lung and soft tissue windows were generated. Coronal and sagittal reformations were generated.  CT dose reduction was achieved through use of a standardized protocol tailored for this examination and automatic exposure control for dose modulation.  FINDINGS: LUNG BASES:Cardiac pacer dependent atelectasis. LIVER/GALLBLADDER: Cirrhotic change. Cholecystectomy. There is no intrahepatic duct dilatation. There is no hepatic parenchymal mass. Hepatic enhancement pattern is within normal limits. Portal vein is patent. SPLEEN/PANCREAS: Central splenic hypodensity.  There is no pancreatic duct dilatation. There is splenomegaly. ADRENALS/KIDNEYS: Left renal hypodensity is most consistent with a cyst.  There is no hydronephrosis. There is no renal mass. There is no perinephric mass. STOMACH: No dilatation or wall thickening. COLON AND SMALL BOWEL: Fecal stasis. There is no free intraperitoneal air. There is no evidence of incarceration or obstruction. No mesenteric adenopathy. PERITONEUM: Unremarkable. APPENDIX: Unremarkable. BLADDER/REPRODUCTIVE ORGANS: No mass or calculus. RETROPERITONEUM: Aortic atherosclerotic change. Common iliac atherosclerotic change. The  abdominal aorta is normal in caliber. No aneurysm. No  retroperitoneal adenopathy. OSSEOUS STRUCTURES: No destructive bone lesion.     Cirrhotic change with splenomegaly. Postcholecystectomy. Incidental and/or nonemergent findings are as described above.       _______________________________________________________________________    TOTAL TIME:  76 Minutes    Critical Care Provided     Minutes non procedure based    Signed: Casi Westerfeld Ned Card, MD    Procedures: see electronic medical records for all procedures/Xrays and details which were not copied into this note but were reviewed prior to creation of Plan.

## 2022-04-05 NOTE — ED Provider Notes (Signed)
[facility] EMERGENCY DEPT  EMERGENCY DEPARTMENT HISTORY AND PHYSICAL EXAM      Date: 04/05/2022  Patient Name: Carlos Petersen  MRN: [id number]  Birthdate: July 22, 1951  Date of evaluation: 04/05/2022  Provider: Genelle Bal, MD   Note Started: 1:50 PM EDT 04/05/22    HISTORY OF PRESENT ILLNESS     Chief Complaint   Patient presents with    Nausea    Emesis       History Provided By: Patient    HPI: Carlos Petersen is a 71 y.o. male with history of COPD, diabetes, Parkinson's disease, A-fib on Eliquis presenting with weakness, lightheadedness.  This is worse when he stands up.  States he has been having a lot of nausea and vomiting has been unable to keep anything down.  He is able to drink liquids but states he cannot drink too much too fast or he vomits.  He endorses weight loss over the past several months.  Patient was admitted to the hospital in May for C. difficile colitis.    PAST MEDICAL HISTORY   Past Medical History:  Past Medical History:   Diagnosis Date    Cerebral artery occlusion with cerebral infarction ([place])     COPD (chronic obstructive pulmonary disease) ([place])     Depression     Diabetes ([place])     Emphysema lung ([facility])     Heart disease     Hypercholesterolemia     Parkinson disease ([place])     Pulmonary embolism ([place])        Past Surgical History:  Past Surgical History:   Procedure Laterality Date    CARDIAC DEFIBRILLATOR PLACEMENT      COLONOSCOPY      PACEMAKER      PACEMAKER PLACEMENT      TOTAL HIP ARTHROPLASTY Right 01/2016       Family History:  History reviewed. No pertinent family history.    Social History:  Social History     Tobacco Use    Smoking status: Every Day     Packs/day: 0.50     Types: Cigarettes    Smokeless tobacco: Never   Substance Use Topics    Alcohol use: Never    Drug use: Never       Allergies:  No Known Allergies    PCP: Gaetano Net, MD    Current Meds:   Current Facility-Administered Medications   Medication Dose Route Frequency Provider Last Rate Last Admin    0.9 %  sodium chloride infusion   IntraVENous PRN Genelle Bal, MD        pantoprazole (PROTONIX) 80 mg in sodium chloride (PF) 0.9 % 20 mL injection  80 mg IntraVENous NOW Genelle Bal, MD         Current Outpatient Medications   Medication Sig Dispense Refill    ferrous sulfate (IRON 325) 325 (65 Fe) MG tablet Take 1 tablet by mouth daily (with breakfast) 30 tablet 0    cholestyramine (QUESTRAN) 4 g packet Take 1 packet by mouth 3 times daily (with meals) for 7 days 21 packet 0    VORTIoxetine HBr (TRINTELLIX) 20 MG TABS tablet Take 1 tablet by mouth daily      fluticasone-umeclidin-vilant (TRELEGY ELLIPTA) 100-62.5-25 MCG/ACT AEPB inhaler Inhale 1 puff into the lungs daily      alendronate (FOSAMAX) 70 MG tablet Take 1 tablet by mouth every 7 days On Wednesday      albuterol sulfate HFA (PROVENTIL;VENTOLIN;PROAIR) 108 (90 Base) MCG/ACT  inhaler Inhale 2 puffs into the lungs every 6 hours as needed      amiodarone (CORDARONE) 200 MG tablet Take 1 tablet by mouth daily      apixaban (ELIQUIS) 5 MG TABS tablet Take 1 tablet by mouth 2 times daily      carbidopa-levodopa (SINEMET CR) 50-200 MG per extended release tablet Take 2 tablets by mouth 3 times daily 180 tablet 11    carvedilol (COREG) 6.25 MG tablet Take 1 tablet by mouth 2 times daily      JARDIANCE 10 MG tablet Take 1 tablet by mouth daily      gabapentin (NEURONTIN) 100 MG capsule Take 1 capsule by mouth in the morning and at bedtime.      omeprazole (PRILOSEC) 40 MG delayed release capsule Take 1 capsule by mouth daily      ranolazine (RANEXA) 500 MG extended release tablet Take 1 tablet by mouth 2 times daily      rosuvastatin (CRESTOR) 20 MG tablet Take 1 tablet by mouth         Social Determinants of Health:   Social Determinants of Health     Tobacco Use: High Risk    Smoking Tobacco Use: Every Day    Smokeless Tobacco Use: Never    Passive Exposure: Not on file   Alcohol Use: Not At Risk    Frequency of Alcohol Consumption: Never    Average  Number of Drinks: Patient does not drink    Frequency of Binge Drinking: Never   Emergency planning/management officer Strain: Not on file   Food Insecurity: Not on file   Transportation Needs: Not on file   Physical Activity: Not on file   Stress: Not on file   Social Connections: Not on file   Intimate Partner Violence: Not on file   Depression: Not at risk    PHQ-2 Score: 0   Housing Stability: Not on file       PHYSICAL EXAM   Physical Exam  Vitals and nursing note reviewed.   Constitutional:       General: He is not in acute distress.     Appearance: Normal appearance.   HENT:      Head: Normocephalic and atraumatic.      Mouth/Throat:      Mouth: Mucous membranes are moist.      Pharynx: No posterior oropharyngeal erythema.   Eyes:      Extraocular Movements: Extraocular movements intact.      Conjunctiva/sclera: Conjunctivae normal.   Cardiovascular:      Rate and Rhythm: Normal rate and regular rhythm.   Pulmonary:      Effort: Pulmonary effort is normal. No respiratory distress.      Breath sounds: Normal breath sounds. No stridor. No wheezing, rhonchi or rales.   Abdominal:      General: Abdomen is flat. There is no distension.      Tenderness: There is no abdominal tenderness.   Musculoskeletal:         General: Normal range of motion.   Skin:     General: Skin is warm and dry.      Capillary Refill: Capillary refill takes less than 2 seconds.      Coloration: Skin is pale.   Neurological:      General: No focal deficit present.      Mental Status: He is alert. Mental status is at baseline.          SCREENINGS  LAB, EKG AND DIAGNOSTIC RESULTS   Labs:  Recent Results (from the past 12 hour(s))   CBC with Auto Differential    Collection Time: 04/05/22  1:06 PM   Result Value Ref Range    WBC 3.9 (L) 4.1 - 11.1 K/uL    RBC 2.61 (L) 4.10 - 5.70 M/uL    Hemoglobin 5.9 (LL) 12.1 - 17.0 g/dL    Hematocrit 19.2 (L) 36.6 - 50.3 %    MCV 73.6 (L) 80.0 - 99.0 FL    MCH 22.6 (L) 26.0 - 34.0 PG    MCHC 30.7 30.0 - 36.5 g/dL     RDW 17.5 (H) 11.5 - 14.5 %    Platelets 98 (L) 150 - 400 K/uL    MPV 9.7 8.9 - 12.9 FL    Nucleated RBCs 0.0 0.0 PER 100 WBC    nRBC 0.00 0.00 - 0.01 K/uL    Neutrophils % 57 32 - 75 %    Lymphocytes % 25 12 - 49 %    Monocytes % 17 (H) 5 - 13 %    Eosinophils % 0 0 - 7 %    Basophils % 0 0 - 1 %    Immature Granulocytes 1 (H) 0 - 0.5 %    Neutrophils Absolute 2.2 1.8 - 8.0 K/UL    Lymphocytes Absolute 1.0 0.8 - 3.5 K/UL    Monocytes Absolute 0.7 0.0 - 1.0 K/UL    Eosinophils Absolute 0.0 0.0 - 0.4 K/UL    Basophils Absolute 0.0 0.0 - 0.1 K/UL    Absolute Immature Granulocyte 0.1 (H) 0.00 - 0.04 K/UL    Differential Type AUTOMATED     Comprehensive Metabolic Panel    Collection Time: 04/05/22  1:06 PM   Result Value Ref Range    Sodium 133 (L) 136 - 145 mmol/L    Potassium 3.2 (L) 3.5 - 5.1 mmol/L    Chloride 102 97 - 108 mmol/L    CO2 23 21 - 32 mmol/L    Anion Gap 8 5 - 15 mmol/L    Glucose 102 (H) 65 - 100 mg/dL    BUN 15 6 - 20 mg/dL    Creatinine 0.74 0.70 - 1.30 mg/dL    Bun/Cre Ratio 20 12 - 20      Est, Glom Filt Rate >60 >60 ml/min/1.55m    Calcium 7.6 (L) 8.5 - 10.1 mg/dL    Total Bilirubin 0.7 0.2 - 1.0 mg/dL    AST 19 15 - 37 U/L    ALT <6 (L) 12 - 78 U/L    Alk Phosphatase 61 45 - 117 U/L    Total Protein 7.3 6.4 - 8.2 g/dL    Albumin 2.0 (L) 3.5 - 5.0 g/dL    Globulin 5.3 (H) 2.0 - 4.0 g/dL    Albumin/Globulin Ratio 0.4 (L) 1.1 - 2.2     TYPE AND SCREEN    Collection Time: 04/05/22  1:06 PM   Result Value Ref Range    Crossmatch expiration date 04/08/2022,2359     ABO/Rh A Negative     Antibody Screen Negative     Unit Number [id number]    Product Code Blood Bank RC LR,2     Unit Divison 00     Dispense Status Blood Bank Allocated     Transfusion Status Ok to transfuse     Crossmatch Result Compatible     Unit Number [id number]    Product Code Blood Bank  RC LR,1     Unit Divison 00     Dispense Status Blood Bank Issued     Transfusion Status Ok to transfuse     Crossmatch Result Compatible     Lipase    Collection Time: 04/05/22  1:11 PM   Result Value Ref Range    Lipase 83 73 - 393 U/L       EKG: Not Applicable    Radiologic Studies:  Non-plain film images such as CT, Ultrasound and MRI are read by the radiologist. Plain radiographic images are visualized and preliminarily interpreted by the ED Physician with the following findings: Not Applicable.    Interpretation per the Radiologist below, if available at the time of this note:  CT ABDOMEN PELVIS W IV CONTRAST Additional Contrast? None   Final Result   Cirrhotic change with splenomegaly.      Postcholecystectomy.      Incidental and/or nonemergent findings are as described above.               ED COURSE and DIFFERENTIAL DIAGNOSIS/MDM   CC/HPI Summary, DDx, ED Course, and Reassessment: 71 year old male with progressive weakness and lightheadedness.  Patient is pale with low normal blood pressures.  Reporting recent weight loss, nausea, vomiting.  Differential includes anemia, dehydration, malignancy, bowel obstruction.  Will obtain labs, CT abdomen and hydrate.    Workup shows severe anemia.  CT abdomen still pending.  Will treat as suspected GI bleed.  Given IV Protonix and starting transfusion.  Records Reviewed (source and summary of external notes): Prior medical records and Nursing notes    Vitals:    Vitals:    04/05/22 1515 04/05/22 1543 04/05/22 1551 04/05/22 1601   BP:  (!) 106/52 (!) 103/50 (!) 105/51   Pulse:  83 81 82   Resp: '19 20 15 15   ' Temp:  98.4 F (36.9 C) 98.1 F (36.7 C) 98.2 F (36.8 C)   TempSrc:   Oral Oral   SpO2:  99% 97% 98%   Weight:       Height:            ED COURSE  ED Course as of 04/05/22 1613   Mon Apr 05, 2022   1358 Hemoglobin Quant(!!): 5.9  Baseline 8 [KK]      ED Course User Index  [KK] Genelle Bal, MD       Disposition Considerations (Tests not done, Shared Decision Making, Pt Expectation of Test or Treatment.): See MDM    Patient was given the following medications:  Medications   0.9 % sodium  chloride infusion (has no administration in time range)   pantoprazole (PROTONIX) 80 mg in sodium chloride (PF) 0.9 % 20 mL injection (has no administration in time range)   iopamidol (ISOVUE-370) 76 % injection 100 mL (100 mLs IntraVENous Given 04/05/22 1546)       CONSULTS: (Who and What was discussed)  IP CONSULT TO HOSPITALIST     Social Determinants affecting Dx or Tx: None    Smoking Cessation: Not Applicable    PROCEDURES   Unless otherwise noted above, none.  Critical Care  Performed by: Genelle Bal, MD  Authorized by: Genelle Bal, MD     Critical care provider statement:     Critical care time (minutes):  40    Critical care time was exclusive of:  Separately billable procedures and treating other patients and teaching time    Critical care was necessary to treat or prevent imminent  or life-threatening deterioration of the following conditions:  Shock, metabolic crisis, dehydration and circulatory failure    Critical care was time spent personally by me on the following activities:  Blood draw for specimens, ordering and review of radiographic studies, ordering and review of laboratory studies, ordering and performing treatments and interventions, review of old charts, examination of patient, evaluation of patient's response to treatment, obtaining history from patient or surrogate and development of treatment plan with patient or surrogate    Care discussed with: admitting provider            FINAL IMPRESSION     1. Anemia, unspecified type    2. Gastrointestinal hemorrhage, unspecified gastrointestinal hemorrhage type          DISPOSITION/PLAN   DISPOSITION Decision To Admit 04/05/2022 04:12:45 PM    Admit Note: Pt is being admitted by dr Barbaraann Barthel. The results of their tests and reason(s) for their admission have been discussed with pt and/or available family. They convey agreement and understanding for the need to be admitted and for the admission diagnosis.      I am the Primary Clinician of  Record: Genelle Bal, MD (electronically signed)    (Please note that parts of this dictation were completed with voice recognition software. Quite often unanticipated grammatical, syntax, homophones, and other interpretive errors are inadvertently transcribed by the computer software. Please disregards these errors. Please excuse any errors that have escaped final proofreading.)     Genelle Bal, MD  04/06/22 [phone]

## 2022-04-05 NOTE — ED Triage Notes (Signed)
Arrives via EMS with complaints of n/v x 1 week, worse after eating. Hx anemia

## 2022-04-05 NOTE — ED Notes (Signed)
Report tubed to [facility] [address].     Carlos Petersen, [place]  04/05/22 [phone]

## 2022-04-06 LAB — CBC WITH AUTO DIFFERENTIAL
Absolute Immature Granulocyte: 0.1 10*3/uL — ABNORMAL HIGH (ref 0.00–0.04)
Basophils %: 1 % (ref 0–1)
Basophils Absolute: 0 10*3/uL (ref 0.0–0.1)
Eosinophils %: 0 % (ref 0–7)
Eosinophils Absolute: 0 10*3/uL (ref 0.0–0.4)
Hematocrit: 22.1 % — ABNORMAL LOW (ref 36.6–50.3)
Hemoglobin: 7 g/dL — ABNORMAL LOW (ref 12.1–17.0)
Immature Granulocytes: 2 % — ABNORMAL HIGH (ref 0–0.5)
Lymphocytes %: 25 % (ref 12–49)
Lymphocytes Absolute: 1 10*3/uL (ref 0.8–3.5)
MCH: 24.5 PG — ABNORMAL LOW (ref 26.0–34.0)
MCHC: 31.7 g/dL (ref 30.0–36.5)
MCV: 77.3 FL — ABNORMAL LOW (ref 80.0–99.0)
MPV: 9.8 FL (ref 8.9–12.9)
Monocytes %: 14 % — ABNORMAL HIGH (ref 5–13)
Monocytes Absolute: 0.6 10*3/uL (ref 0.0–1.0)
Neutrophils %: 58 % (ref 32–75)
Neutrophils Absolute: 2.3 10*3/uL (ref 1.8–8.0)
Nucleated RBCs: 0 PER 100 WBC
Platelets: 100 10*3/uL — ABNORMAL LOW (ref 150–400)
RBC: 2.86 M/uL — ABNORMAL LOW (ref 4.10–5.70)
RDW: 17.8 % — ABNORMAL HIGH (ref 11.5–14.5)
WBC: 3.9 10*3/uL — ABNORMAL LOW (ref 4.1–11.1)
nRBC: 0 10*3/uL (ref 0.00–0.01)

## 2022-04-06 LAB — EKG 12-LEAD
Atrial Rate: 65 {beats}/min
Atrial Rate: 88 {beats}/min
P Axis: 93 degrees
P-R Interval: 160 ms
Q-T Interval: 398 ms
Q-T Interval: 400 ms
QRS Duration: 138 ms
QRS Duration: 90 ms
QTc Calculation (Bazett): 413 ms
QTc Calculation (Bazett): 492 ms
R Axis: 54 degrees
R Axis: 62 degrees
T Axis: -72 degrees
T Axis: 69 degrees
Ventricular Rate: 65 {beats}/min
Ventricular Rate: 91 {beats}/min

## 2022-04-06 LAB — COMPREHENSIVE METABOLIC PANEL W/ REFLEX TO MG FOR LOW K
ALT: <6 U/L — ABNORMAL LOW (ref 12–78)
AST: 19 U/L (ref 15–37)
Albumin/Globulin Ratio: 0.4 — ABNORMAL LOW (ref 1.1–2.2)
Albumin: 1.8 g/dL — ABNORMAL LOW (ref 3.5–5.0)
Alk Phosphatase: 57 U/L (ref 45–117)
Anion Gap: 6 mmol/L (ref 5–15)
BUN: 15 mg/dL (ref 6–20)
Bun/Cre Ratio: 26 — ABNORMAL HIGH (ref 12–20)
CO2: 23 mmol/L (ref 21–32)
Calcium: 7.4 mg/dL — ABNORMAL LOW (ref 8.5–10.1)
Chloride: 104 mmol/L (ref 97–108)
Creatinine: 0.57 mg/dL — ABNORMAL LOW (ref 0.70–1.30)
Est, Glom Filt Rate: >60 mL/min/{1.73_m2} (ref >60)
Globulin: 4.9 g/dL — ABNORMAL HIGH (ref 2.0–4.0)
Glucose: 90 mg/dL (ref 65–100)
Potassium: 3.8 mmol/L (ref 3.5–5.1)
Sodium: 133 mmol/L — ABNORMAL LOW (ref 136–145)
Total Bilirubin: 1 mg/dL (ref 0.2–1.0)
Total Protein: 6.7 g/dL (ref 6.4–8.2)

## 2022-04-06 LAB — *Unknown
Date: 8072023
Occult Blood, Stool #1: POSITIVE — AB

## 2022-04-06 LAB — HEPATIC FUNCTION PANEL
ALT: <6 U/L — ABNORMAL LOW (ref 12–78)
AST: 19 U/L (ref 15–37)
Albumin/Globulin Ratio: 0.4 — ABNORMAL LOW (ref 1.1–2.2)
Albumin: 1.8 g/dL — ABNORMAL LOW (ref 3.5–5.0)
Alk Phosphatase: 57 U/L (ref 45–117)
Bilirubin, Direct: 0.4 mg/dL — ABNORMAL HIGH (ref 0.0–0.2)
Globulin: 5 g/dL — ABNORMAL HIGH (ref 2.0–4.0)
Total Bilirubin: 1 mg/dL (ref 0.2–1.0)
Total Protein: 6.8 g/dL (ref 6.4–8.2)

## 2022-04-06 LAB — HEMOGLOBIN AND HEMATOCRIT
Hematocrit: 21.9 % — ABNORMAL LOW (ref 36.6–50.3)
Hemoglobin: 6.8 g/dL — ABNORMAL LOW (ref 12.1–17.0)

## 2022-04-06 MED ORDER — PEG 3350-KCL-NABCB-NACL-NASULF 236 G PO SOLR
236 g | Freq: Once | ORAL | Status: AC
Start: 2022-04-06 — End: 2022-04-06
  Administered 2022-04-06: 23:00:00 4000 mL via ORAL

## 2022-04-06 MED ORDER — KCL IN DEXTROSE-NACL 20-5-0.45 MEQ/L-%-% IV SOLN
20-5-0.45 | INTRAVENOUS | Status: DC
Start: 2022-04-06 — End: 2022-04-11
  Administered 2022-04-06 – 2022-04-10 (×6): via INTRAVENOUS

## 2022-04-06 MED FILL — CARBIDOPA-LEVODOPA ER 25-100 MG PO TBCR: 25-100 MG | ORAL | Qty: 4

## 2022-04-06 MED FILL — RANOLAZINE ER 500 MG PO TB12: 500 MG | ORAL | Qty: 1

## 2022-04-06 MED FILL — GAVILYTE-G 236 G PO SOLR: 236 g | ORAL | Qty: 4000

## 2022-04-06 NOTE — Consults (Signed)
Consult Note            Date:04/06/2022        Patient Name:Carlos Petersen     Date of Birth:09-15-1950     Age:71 y.o.    Consults    Chief Complaint     Chief Complaint   Patient presents with    Nausea    Emesis          History Obtained From   patient    History of Present Illness     71 y.o. male with history of COPD, diabetes, Parkinson's disease, A-fib on Eliquis presenting with weakness, lightheadedness.  Has some abdominal discomfort with a lot of nausea and vomiting has been unable to keep anything down.   He endorses weight loss over the past several months,   The emergency room, patient had a hemoglobin of 5.9, received blood transfusion, CT scan just showed cirrhotic liver, no active bleeding,  denies any blood in stool, no melena, no red blood per rectum, denies any blood in the urine,  Already patient was seen in the hospital, feel better, no nausea vomiting, no pain today.  Past Medical History     Past Medical History:   Diagnosis Date    Cerebral artery occlusion with cerebral infarction ([facility])     COPD (chronic obstructive pulmonary disease) ([facility])     Depression     Diabetes ([facility])     Emphysema lung ([facility])     Heart disease     Hypercholesterolemia     Parkinson disease ([facility])     Pulmonary embolism ([facility])         Past Surgical History     Past Surgical History:   Procedure Laterality Date    CARDIAC DEFIBRILLATOR PLACEMENT      COLONOSCOPY      PACEMAKER      PACEMAKER PLACEMENT      TOTAL HIP ARTHROPLASTY Right 01/2016        Medications     Prior to Admission medications    Medication Sig Start Date End Date Taking? Authorizing Provider   ferrous sulfate (IRON 325) 325 (65 Fe) MG tablet Take 1 tablet by mouth daily (with breakfast) 02/01/22 03/03/22  Jan A Zarefoss, APRN - NP   cholestyramine Lanetta Inch) 4 g packet Take 1 packet by mouth 3 times daily (with meals) for 7 days 01/27/22 02/03/22  Ahmed I Donzetta Sprung, MD   VORTIoxetine HBr (TRINTELLIX) 20 MG TABS tablet Take 1 tablet by mouth daily    Historical  Provider, MD   fluticasone-umeclidin-vilant (TRELEGY ELLIPTA) 100-62.5-25 MCG/ACT AEPB inhaler Inhale 1 puff into the lungs daily 11/17/21   Ar Automatic Reconciliation   alendronate (FOSAMAX) 70 MG tablet Take 1 tablet by mouth every 7 days On Wednesday    Historical Provider, MD   albuterol sulfate HFA (PROVENTIL;VENTOLIN;PROAIR) 108 (90 Base) MCG/ACT inhaler Inhale 2 puffs into the lungs every 6 hours as needed    Historical Provider, MD   amiodarone (CORDARONE) 200 MG tablet Take 1 tablet by mouth daily 11/17/21   Historical Provider, MD   apixaban (ELIQUIS) 5 MG TABS tablet Take 1 tablet by mouth 2 times daily 11/17/21   Historical Provider, MD   carbidopa-levodopa (SINEMET CR) 50-200 MG per extended release tablet Take 2 tablets by mouth 3 times daily 11/17/21 11/12/22  Historical Provider, MD   carvedilol (COREG) 6.25 MG tablet Take 1 tablet by mouth 2 times daily 11/17/21   Historical Provider, MD  JARDIANCE 10 MG tablet Take 1 tablet by mouth daily 11/18/21   Historical Provider, MD   gabapentin (NEURONTIN) 100 MG capsule Take 1 capsule by mouth in the morning and at bedtime. 12/23/21   Historical Provider, MD   omeprazole (PRILOSEC) 40 MG delayed release capsule Take 1 capsule by mouth daily    Historical Provider, MD   ranolazine (RANEXA) 500 MG extended release tablet Take 1 tablet by mouth 2 times daily 11/27/21   Historical Provider, MD   rosuvastatin (CRESTOR) 20 MG tablet Take 1 tablet by mouth daily 11/27/21 11/22/22  Historical Provider, MD        dextrose 5 % and 0.45 % NaCl with KCl 20 mEq infusion, Continuous  0.9 % sodium chloride infusion, PRN  pantoprazole (PROTONIX) injection 40 mg, BID  albuterol sulfate HFA (PROVENTIL;VENTOLIN;PROAIR) 108 (90 Base) MCG/ACT inhaler 2 puff, Q6H PRN  amiodarone (CORDARONE) tablet 200 mg, Daily  carbidopa-levodopa (SINEMET CR) 25-100 MG per extended release tablet 4 tablet, TID  ferrous sulfate (IRON 325) tablet 325 mg, Daily with breakfast  gabapentin (NEURONTIN)  capsule 100 mg, BID  ranolazine (RANEXA) extended release tablet 500 mg, BID  rosuvastatin (CRESTOR) tablet 20 mg, Nightly  VORTIoxetine HBr (TRINTELLIX) tablet 20 mg, Daily  sodium chloride flush 0.9 % injection 5-40 mL, 2 times per day  sodium chloride flush 0.9 % injection 5-40 mL, PRN  0.9 % sodium chloride infusion, PRN  ondansetron (ZOFRAN-ODT) disintegrating tablet 4 mg, Q8H PRN   Or  ondansetron (ZOFRAN) injection 4 mg, Q6H PRN  polyethylene glycol (GLYCOLAX) packet 17 g, Daily PRN  acetaminophen (TYLENOL) tablet 650 mg, Q6H PRN   Or  acetaminophen (TYLENOL) suppository 650 mg, Q6H PRN  budesonide-formoterol (SYMBICORT) 160-4.5 MCG/ACT inhaler 2 puff, BID RT   And  tiotropium (SPIRIVA RESPIMAT) 2.5 MCG/ACT inhaler 2 puff, Daily RT        Allergies   Patient has no known allergies.    Social History     Social History       Tobacco History       Smoking Status  Every Day Smoking Frequency  0.50 packs/day Smoking Tobacco Type  Cigarettes      Smokeless Tobacco Use  Never              Alcohol History       Alcohol Use Status  Never              Drug Use       Drug Use Status  Never              Sexual Activity       Sexually Active  Not Asked                    Family History   History reviewed. No pertinent family history.    Review of Systems   Review of Systems   Constitutional:  Positive for fatigue.   Eyes: Negative.    Respiratory: Negative.     Gastrointestinal:  Positive for nausea.   Genitourinary:  Positive for flank pain.   Musculoskeletal:  Positive for back pain.   Skin: Negative.    Psychiatric/Behavioral: Negative.        Physical Exam   BP (!) 98/49   Pulse 77   Temp 97.3 F (36.3 C) (Oral)   Resp 17   Ht 1.803 m (5\' 11" )   Wt 67.5 kg (148 lb 13 oz)   SpO2 100%  BMI 20.75 kg/m      Physical Exam  Constitutional:       Appearance: He is ill-appearing.   HENT:      Head: Atraumatic.      Mouth/Throat:      Mouth: Mucous membranes are dry.   Eyes:      General: Scleral icterus present.    Cardiovascular:      Rate and Rhythm: Normal rate. Rhythm irregular.   Pulmonary:      Breath sounds: Normal breath sounds.   Abdominal:      General: Abdomen is flat.      Palpations: There is no mass.   Musculoskeletal:         General: Normal range of motion.      Cervical back: Neck supple.   Skin:     General: Skin is warm.   Neurological:      Mental Status: Mental status is at baseline.       Labs    CBC:  Recent Labs     04/05/22  1306 04/06/22  0255   WBC 3.9* 3.9*   RBC 2.61* 2.86*   HGB 5.9* 7.0*   HCT 19.2* 22.1*   MCV 73.6* 77.3*   RDW 17.5* 17.8*   PLT 98* 100*     CHEMISTRIES:  Recent Labs     04/05/22  1306 04/06/22  0255   NA 133* 133*   K 3.2* 3.8   CL 102 104   CO2 23 23   BUN 15 15   CREATININE 0.74 0.57*   GLUCOSE 102* 90     PT/INR:No results for input(s): PROTIME, INR in the last 72 hours.  APTT:No results for input(s): APTT in the last 72 hours.  LIVER PROFILE:  Recent Labs     04/05/22  1306 04/06/22  0255   AST 19 19  19    ALT <6* <6*  <6*   BILIDIR  --  0.4*   BILITOT 0.7 1.0  1.0   ALKPHOS 61 57  57       Imaging/Diagnostics   CT ABDOMEN PELVIS W IV CONTRAST Additional Contrast? None    Result Date: 04/05/2022  Cirrhotic change with splenomegaly. Postcholecystectomy. Incidental and/or nonemergent findings are as described above.        Assessment      Hospital Problems             Last Modified POA    * (Principal) GI bleed 04/05/2022 Yes   Anemia, microcytic, hypochromic,  No obvious GI bleeding, likely from obscure GI blood loss    Anticoagulation at home,        Recommendations      Laboratory monitoring  Iron studies sent,  Hold home anticoagulation  Protonix 40 mg po daily  Clear liquid diet, n.p.o.    Bowel  preparation today    EGD colonoscopy in the morning    Occasion, risks, option discussed with patient  Electronically signed by 06/05/2022, MD on 04/06/22 at 3:59 PM EDT

## 2022-04-06 NOTE — Progress Notes (Signed)
Hospitalist Progress Note    NAME:   Carlos Petersen   DOB: [birth year]   MRN: [id number]     Subjective:   Daily Progress Note: 04/06/2022     Hospital course to date/HPI from H&P:  Carlos Petersen is a 71 y.o.  male with PMHx significant multiple comorbidities presents [facility] with complaints of weakness/dizziness.  Patient states over the past few days has been having gradual onset persistent progressive generalized weakness as well as dizziness resulting in a limitation in ambulation following which patient presented to the ED.  Of note patient is on blood thinners for blood clots.  Patient denies any fevers chills nausea vomiting orthopnea paroxysmal nocturnal dyspnea chest pain palpitations headache focal weakness loss sensation auditory or visual symptoms abdominal or urinary complaints or any other associated symptoms.  Patient endorses no recent sick contacts or travel activity  We were asked to admit for work up and evaluation of the above problems.     Chief complaint: Weakness and dizziness  04/06/2022-patient seen and examined, chart was reviewed.  Patient denied any pain, nausea, vomiting.  No other acute issues reported to me by staff at this time.    Current Facility-Administered Medications   Medication Dose Route Frequency    0.9 % sodium chloride infusion   IntraVENous PRN    pantoprazole (PROTONIX) injection 40 mg  40 mg IntraVENous BID    albuterol sulfate HFA (PROVENTIL;VENTOLIN;PROAIR) 108 (90 Base) MCG/ACT inhaler 2 puff  2 puff Inhalation Q6H PRN    amiodarone (CORDARONE) tablet 200 mg  200 mg Oral Daily    carbidopa-levodopa (SINEMET CR) 25-100 MG per extended release tablet 4 tablet  4 tablet Oral TID    ferrous sulfate (IRON 325) tablet 325 mg  325 mg Oral Daily with breakfast    gabapentin (NEURONTIN) capsule 100 mg  100 mg Oral BID    ranolazine (RANEXA) extended release tablet 500 mg  500 mg Oral BID    rosuvastatin (CRESTOR) tablet 20 mg  20 mg Oral Nightly     VORTIoxetine HBr (TRINTELLIX) tablet 20 mg  20 mg Oral Daily    sodium chloride flush 0.9 % injection 5-40 mL  5-40 mL IntraVENous 2 times per day    sodium chloride flush 0.9 % injection 5-40 mL  5-40 mL IntraVENous PRN    0.9 % sodium chloride infusion   IntraVENous PRN    ondansetron (ZOFRAN-ODT) disintegrating tablet 4 mg  4 mg Oral Q8H PRN    Or    ondansetron (ZOFRAN) injection 4 mg  4 mg IntraVENous Q6H PRN    polyethylene glycol (GLYCOLAX) packet 17 g  17 g Oral Daily PRN    acetaminophen (TYLENOL) tablet 650 mg  650 mg Oral Q6H PRN    Or    acetaminophen (TYLENOL) suppository 650 mg  650 mg Rectal Q6H PRN    budesonide-formoterol (SYMBICORT) 160-4.5 MCG/ACT inhaler 2 puff  2 puff Inhalation BID RT    And    tiotropium (SPIRIVA RESPIMAT) 2.5 MCG/ACT inhaler 2 puff  2 puff Inhalation Daily RT          Objective:     BP (!) 117/51   Pulse 85   Temp 98.1 F (36.7 C) (Oral)   Resp 17   Ht 1.803 m ('5\' 11"' )   Wt 67.5 kg (148 lb 13 oz)   SpO2 98%   BMI 20.75 kg/m         Temp (24hrs), Avg:98.2 F (  36.8 C), Min:98 F (36.7 C), Max:98.4 F (36.9 C)        PHYSICAL EXAM:  Gen thin built  Neck Supple  CVS RRR  Resp Symmetric expansion  Abdomen soft, NT/ND  Ext moves all  Neuro Alert slow speech  Psych flat affect  Skin no visible Rash        Data Review:    Recent Labs     04/06/22  0255   NA 133*   K 3.8   CL 104   CO2 23   GLUCOSE 90   BUN 15   CREATININE 0.57*   CALCIUM 7.4*   LABALBU 1.8*  1.8*   BILITOT 1.0  1.0   AST 19  19   ALT <6*  <6*       Recent Results (from the past 18 hour(s))   Occult Blood, Fecal    Collection Time: 04/05/22  9:47 PM   Result Value Ref Range    Occult Blood, Stool #1 Positive (A) Negative      Date 8,072,023     Comprehensive Metabolic Panel w/ Reflex to MG    Collection Time: 04/06/22  2:55 AM   Result Value Ref Range    Sodium 133 (L) 136 - 145 mmol/L    Potassium 3.8 3.5 - 5.1 mmol/L    Chloride 104 97 - 108 mmol/L    CO2 23 21 - 32 mmol/L    Anion Gap 6 5 - 15  mmol/L    Glucose 90 65 - 100 mg/dL    BUN 15 6 - 20 mg/dL    Creatinine 0.57 (L) 0.70 - 1.30 mg/dL    Bun/Cre Ratio 26 (H) 12 - 20      Est, Glom Filt Rate >60 >60 ml/min/1.59m    Calcium 7.4 (L) 8.5 - 10.1 mg/dL    Total Bilirubin 1.0 0.2 - 1.0 mg/dL    AST 19 15 - 37 U/L    ALT <6 (L) 12 - 78 U/L    Alk Phosphatase 57 45 - 117 U/L    Total Protein 6.7 6.4 - 8.2 g/dL    Albumin 1.8 (L) 3.5 - 5.0 g/dL    Globulin 4.9 (H) 2.0 - 4.0 g/dL    Albumin/Globulin Ratio 0.4 (L) 1.1 - 2.2     CBC with Auto Differential    Collection Time: 04/06/22  2:55 AM   Result Value Ref Range    WBC 3.9 (L) 4.1 - 11.1 K/uL    RBC 2.86 (L) 4.10 - 5.70 M/uL    Hemoglobin 7.0 (L) 12.1 - 17.0 g/dL    Hematocrit 22.1 (L) 36.6 - 50.3 %    MCV 77.3 (L) 80.0 - 99.0 FL    MCH 24.5 (L) 26.0 - 34.0 PG    MCHC 31.7 30.0 - 36.5 g/dL    RDW 17.8 (H) 11.5 - 14.5 %    Platelets 100 (L) 150 - 400 K/uL    MPV 9.8 8.9 - 12.9 FL    Nucleated RBCs 0.0 0.0 PER 100 WBC    nRBC 0.00 0.00 - 0.01 K/uL    Neutrophils % 58 32 - 75 %    Lymphocytes % 25 12 - 49 %    Monocytes % 14 (H) 5 - 13 %    Eosinophils % 0 0 - 7 %    Basophils % 1 0 - 1 %    Immature Granulocytes 2 (H) 0 - 0.5 %  Neutrophils Absolute 2.3 1.8 - 8.0 K/UL    Lymphocytes Absolute 1.0 0.8 - 3.5 K/UL    Monocytes Absolute 0.6 0.0 - 1.0 K/UL    Eosinophils Absolute 0.0 0.0 - 0.4 K/UL    Basophils Absolute 0.0 0.0 - 0.1 K/UL    Absolute Immature Granulocyte 0.1 (H) 0.00 - 0.04 K/UL    Differential Type AUTOMATED     Hepatic Function Panel    Collection Time: 04/06/22  2:55 AM   Result Value Ref Range    Total Protein 6.8 6.4 - 8.2 g/dL    Albumin 1.8 (L) 3.5 - 5.0 g/dL    Globulin 5.0 (H) 2.0 - 4.0 g/dL    Albumin/Globulin Ratio 0.4 (L) 1.1 - 2.2      Total Bilirubin 1.0 0.2 - 1.0 mg/dL    Bilirubin, Direct 0.4 (H) 0.0 - 0.2 mg/dL    Alk Phosphatase 57 45 - 117 U/L    AST 19 15 - 37 U/L    ALT <6 (L) 12 - 78 U/L     No results found for this or any previous visit.     Results       ** No results  found for the last 336 hours. **             CT ABDOMEN PELVIS W IV CONTRAST Result Date: 04/05/2022    Cirrhotic change with splenomegaly. Postcholecystectomy. Incidental and/or nonemergent findings are as described above.           Assessment/Plan:     Suspected GI bleed with acute blood loss anemia-  S/p PRBC  Monitor H&H  IV PPI  GI to see  N.p.o. for any GI procedures  Follow GI recommendations  Transfuse if below 7    Cirrhotic liver with splenomegaly-  Monitor INR and ammonia  LFTs noted  GI on case follow recommendations  Hematology eval for splenomegaly  Defer work-up to GI team      Electrolyte imbalances-monitor and replete  HLD-on statins  Previous DVT-holding anticoagulation, hematology evaluation  Mild pancytopenia-monitor CBC, hematology evaluation  Chronic A-fib-continue Amio, holding anticoagulation  ?Parkinson's disease-continue Sinemet    Discussion/MDM:- Discussion/MDM: Patient with multiple medical comorbidities, each with high likelihood for morbidity and mortality if left untreated.   I have reviewed patient's presenting subjective and objective findings, as well as all laboratory studies, imaging studies, and vital signs to date as well as treatment rendered and patient's response to those treatments. Pt need IV fluids with additives and Drug therapy requiring intensive monitoring for toxicity. In addition, prior medical, surgical and relevant social and family histories were reviewed. I have discussed management plan with patient/family and with nursing staff.        AD FC  DVT Prx -SCD  NOK -he did not specify  Social Determinants of Health-unknown    Disposition-48 H, home versus SNF    Barriers to discharge-IV Protonix, medical stability, consultant clearance        This is dictation was done by dragon, computer voice recognition software.  Quite often unanticipated grammatical, syntax, homophones and other interpretive errors or inadvertently transcribed by the computer software.  Please  excuse errors that have escaped final proofreading. Please contact me for any questions or details.  Thank you.     Signed By: Leana Roe, MD     April 06, 2022

## 2022-04-06 NOTE — Plan of Care (Signed)
Problem: Discharge Planning  Goal: Discharge to home or other facility with appropriate resources  Outcome: Progressing     Problem: Skin/Tissue Integrity  Goal: Absence of new skin breakdown  Description: 1.  Monitor for areas of redness and/or skin breakdown  2.  Assess vascular access sites hourly  3.  Every 4-6 hours minimum:  Change oxygen saturation probe site  4.  Every 4-6 hours:  If on nasal continuous positive airway pressure, respiratory therapy assess nares and determine need for appliance change or resting period.  Outcome: Progressing     Problem: ABCDS Injury Assessment  Goal: Absence of physical injury  Outcome: Progressing     Problem: Safety - Adult  Goal: Free from fall injury  Outcome: Progressing

## 2022-04-06 NOTE — Progress Notes (Signed)
Admission skin assessment completed with Ardine Eng. Pt has sacral pressure injury with blanchable redness surrounding ulcer, right outer upper forearm skin tear, bruising all extremities. Scabs to RL lateral ankle.

## 2022-04-06 NOTE — Care Coordination-Inpatient (Signed)
Spoke with patient and son about discharge planing. Confirmed the plan is [facility]. Explained we have to wait for PT/OT recommendations and then insurance authorization. Also informed them that they have been accepted to [facility] pending insurance auth.     Orders are in for PT/OT, to be seen when medically appropriate.

## 2022-04-06 NOTE — Progress Notes (Signed)
Hematology and Oncology Inpatient Consult Note     Patient: Carlos Petersen MRN: [id number]  SSN: [id number]    Date of Birth: [birth year]  Age: 71 y.o.  Sex: male    Chief Complaint: Patient with increasing weakness and feeling tired    Reason for consult: Evaluation management of patient with anemia and pancytopenia    Subjective:      Carlos Petersen is a 71 y.o. white male who was admitted with complaint of increasing weakness and not having much energy.  He was feeling dizzy which is going on for last several days getting progressively worse.  Patient did not have any bleeding.  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  Patient was found to have severe anemia and I was asked to see him for further hematologic evaluation.  Patient him about having any hematologic or oncologic issues in the past except his PE.    He had significant nausea and vomiting and could not keep anything down.  Patient was also not able to walk because of weakness and he has history of left hip fracture.    Past Medical History:   Diagnosis Date    Cerebral artery occlusion with cerebral infarction [facility])     COPD (chronic obstructive pulmonary disease) (HCC)     Depression     Diabetes ([place])     Emphysema lung (HCC)     Heart disease     Hypercholesterolemia     Parkinson disease ([place])     Pulmonary embolism ([place])      Past Surgical History:   Procedure Laterality Date    CARDIAC DEFIBRILLATOR PLACEMENT      COLONOSCOPY      PACEMAKER      PACEMAKER PLACEMENT      TOTAL HIP ARTHROPLASTY Right 01/2016      History reviewed. No pertinent family history.  Social History     Tobacco Use    Smoking status: Every Day     Packs/day: 0.50     Types: Cigarettes    Smokeless tobacco: Never   Substance Use Topics    Alcohol use: Never        Family history: Negative for any malignancy, leukemia or lymphoma.    Occupational history: Retired he used to work in [employer].    Current Facility-Administered Medications    Medication Dose Route Frequency Provider Last Rate Last Admin    0.9 % sodium chloride infusion   IntraVENous PRN Ahmed Ned Card, MD        pantoprazole (PROTONIX) injection 40 mg  40 mg IntraVENous BID Ahmed Ned Card, MD   40 mg at 04/06/22 0902    albuterol sulfate HFA (PROVENTIL;VENTOLIN;PROAIR) 108 (90 Base) MCG/ACT inhaler 2 puff  2 puff Inhalation Q6H PRN Ahmed Ned Card, MD        amiodarone (CORDARONE) tablet 200 mg  200 mg Oral Daily Ahmed Ned Card, MD   200 mg at 04/06/22 0855    carbidopa-levodopa (SINEMET CR) 25-100 MG per extended release tablet 4 tablet  4 tablet Oral TID Ahmed Ned Card, MD   4 tablet at 04/06/22 0901    ferrous sulfate (IRON 325) tablet 325 mg  325 mg Oral Daily with breakfast Ahmed Ned Card, MD   325 mg at 04/06/22 0855    gabapentin (NEURONTIN) capsule 100 mg  100 mg Oral BID Ahmed Ned Card, MD   100 mg at  04/06/22 0855    ranolazine (RANEXA) extended release tablet 500 mg  500 mg Oral BID Ahmed Ned Card, MD   500 mg at 04/06/22 0944    rosuvastatin (CRESTOR) tablet 20 mg  20 mg Oral Nightly Ahmed Ned Card, MD   20 mg at 04/05/22 2141    VORTIoxetine HBr (TRINTELLIX) tablet 20 mg  20 mg Oral Daily Ahmed Ned Card, MD        sodium chloride flush 0.9 % injection 5-40 mL  5-40 mL IntraVENous 2 times per day Ahmed Ned Card, MD   10 mL at 04/06/22 0902    sodium chloride flush 0.9 % injection 5-40 mL  5-40 mL IntraVENous PRN Ahmed Ned Card, MD        0.9 % sodium chloride infusion   IntraVENous PRN Ahmed Ned Card, MD        ondansetron (ZOFRAN-ODT) disintegrating tablet 4 mg  4 mg Oral Q8H PRN Ahmed Ned Card, MD        Or    ondansetron [facility]) injection 4 mg  4 mg IntraVENous Q6H PRN Ahmed Ned Card, MD        polyethylene glycol [facility]) packet 17 g  17 g Oral Daily PRN Ahmed Ned Card, MD        acetaminophen  (TYLENOL) tablet 650 mg  650 mg Oral Q6H PRN Ahmed Ned Card, MD        Or    acetaminophen (TYLENOL) suppository 650 mg  650 mg Rectal Q6H PRN Ahmed Ned Card, MD        budesonide-formoterol [facility]) 160-4.5 MCG/ACT inhaler 2 puff  2 puff Inhalation BID RT Ahmed Ned Card, MD   2 puff at 04/06/22 0732    And    tiotropium (SPIRIVA RESPIMAT) 2.5 MCG/ACT inhaler 2 puff  2 puff Inhalation Daily RT Ahmed Ned Card, MD            No Known Allergies    Review of Systems:  CONSTITUTIONAL: No fever, no chills.No repeated infections. No night sweats.  Patient is feeling extremely tired and weak  HEENT: No mouth sores. No Epistaxis.  He has hearing impairment. No change in taste or smell sensations.  CARDIOVASCULAR: No  palpitations or chest pain. No edema. No syncope.  Has dizziness  RESPIRATORY: No cough.  He has dyspnea on exertion. No Hemoptysis. No wheezing. No hoarseness of voice.  GI: Patient also had nausea vomiting at time of admission. No diarrhea, constipation, no bright red blood per rectum. No hematemesis or melena. No weight loss. No dysphagia.  GU: No dysuria, no hematuria. No frequency of urination.  INTEGUMENTARY: No skin rash or palpable lumps or bumps.  HEMATOLOGIC: No history of easy bruisability. No gingival bleeding  NEURO: No focal weakness, No paresthesia. No headache or seizures.  MUSCULOSKELETAL: He has hip pain and other joint pain  ENDOCRINE:   PSYCHIATRIC:  Objective:     Vitals:    04/06/22 0232 04/06/22 0530 04/06/22 0732 04/06/22 0852   BP: (!) 103/45   (!) 117/51   Pulse: 82  78 85   Resp: _0 Temp: 98.1 F (36.7 C)   98.1 F (36.7 C)   TempSrc: Oral   Oral   SpO2: 100%  99% 98%   Weight:  67.5 kg (148 lb 13 oz)     Height:  Physical Exam:  Constitutional: Elderly white male looks chronically ill.  Not in any acute distress or pain.  Eyes: Sclerae anicteric. Conjunctivae shows pallor.  Patient is bitemporal wasting.  ENMT: Oral  mucosa is moist, no thrush, mucositis, or petechiae.  Neck: No adenopathy, JVD or thyromegaly.   Hematologic/Lymphatic: Bilateral axillary/inguinal regions showed no adenopathy.  Respiratory: Lungs are clear bilaterally.  Cardiovascular: Normal sinus rhythm; no gallop or murmur; peripheral pulses are palpable.  Patient has defibrillator on left side chest wall.  Abdomen: Soft, nontender, no hepatosplenomegaly. No guarding or rigidity. Bowel sounds present.  Back/Spine: No spinal tenderness; no costovertebral angle tenderness.  Extremities: No edema, cyanosis or clubbing.  Skin: No petechiae; no skin rash.  Neurologic: Alert/oriented x 3; no focal neurological deficits.    Recent Results (from the past 24 hour(s))   Occult Blood, Fecal    Collection Time: 04/05/22  9:47 PM   Result Value Ref Range    Occult Blood, Stool #1 Positive (A) Negative      Date 8,072,023     Comprehensive Metabolic Panel w/ Reflex to MG    Collection Time: 04/06/22  2:55 AM   Result Value Ref Range    Sodium 133 (L) 136 - 145 mmol/L    Potassium 3.8 3.5 - 5.1 mmol/L    Chloride 104 97 - 108 mmol/L    CO2 23 21 - 32 mmol/L    Anion Gap 6 5 - 15 mmol/L    Glucose 90 65 - 100 mg/dL    BUN 15 6 - 20 mg/dL    Creatinine 0.57 (L) 0.70 - 1.30 mg/dL    Bun/Cre Ratio 26 (H) 12 - 20      Est, Glom Filt Rate >60 >60 ml/min/1.33m    Calcium 7.4 (L) 8.5 - 10.1 mg/dL    Total Bilirubin 1.0 0.2 - 1.0 mg/dL    AST 19 15 - 37 U/L    ALT <6 (L) 12 - 78 U/L    Alk Phosphatase 57 45 - 117 U/L    Total Protein 6.7 6.4 - 8.2 g/dL    Albumin 1.8 (L) 3.5 - 5.0 g/dL    Globulin 4.9 (H) 2.0 - 4.0 g/dL    Albumin/Globulin Ratio 0.4 (L) 1.1 - 2.2     CBC with Auto Differential    Collection Time: 04/06/22  2:55 AM   Result Value Ref Range    WBC 3.9 (L) 4.1 - 11.1 K/uL    RBC 2.86 (L) 4.10 - 5.70 M/uL    Hemoglobin 7.0 (L) 12.1 - 17.0 g/dL    Hematocrit 22.1 (L) 36.6 - 50.3 %    MCV 77.3 (L) 80.0 - 99.0 FL    MCH 24.5 (L) 26.0 - 34.0 PG    MCHC 31.7 30.0 - 36.5 g/dL     RDW 17.8 (H) 11.5 - 14.5 %    Platelets 100 (L) 150 - 400 K/uL    MPV 9.8 8.9 - 12.9 FL    Nucleated RBCs 0.0 0.0 PER 100 WBC    nRBC 0.00 0.00 - 0.01 K/uL    Neutrophils % 58 32 - 75 %    Lymphocytes % 25 12 - 49 %    Monocytes % 14 (H) 5 - 13 %    Eosinophils % 0 0 - 7 %    Basophils % 1 0 - 1 %    Immature Granulocytes 2 (H) 0 - 0.5 %    Neutrophils Absolute 2.3 1.8 -  8.0 K/UL    Lymphocytes Absolute 1.0 0.8 - 3.5 K/UL    Monocytes Absolute 0.6 0.0 - 1.0 K/UL    Eosinophils Absolute 0.0 0.0 - 0.4 K/UL    Basophils Absolute 0.0 0.0 - 0.1 K/UL    Absolute Immature Granulocyte 0.1 (H) 0.00 - 0.04 K/UL    Differential Type AUTOMATED     Hepatic Function Panel    Collection Time: 04/06/22  2:55 AM   Result Value Ref Range    Total Protein 6.8 6.4 - 8.2 g/dL    Albumin 1.8 (L) 3.5 - 5.0 g/dL    Globulin 5.0 (H) 2.0 - 4.0 g/dL    Albumin/Globulin Ratio 0.4 (L) 1.1 - 2.2      Total Bilirubin 1.0 0.2 - 1.0 mg/dL    Bilirubin, Direct 0.4 (H) 0.0 - 0.2 mg/dL    Alk Phosphatase 57 45 - 117 U/L    AST 19 15 - 37 U/L    ALT <6 (L) 12 - 78 U/L        CT ABDOMEN PELVIS W IV CONTRAST Additional Contrast? None   Final Result   Cirrhotic change with splenomegaly.      Postcholecystectomy.      Incidental and/or nonemergent findings are as described above.                 Assessment:         Assessment & Plan:     71 year old white male with history of multiple medical problems who came with increasing shortness of breath, generalized weakness and was found to have pancytopenia and severe anemia.    1) anemia: Patient came with severe anemia.  Patient has no obvious bleeding.  -His peripheral smear on 8 August shows microcytosis, anisocytosis, decrease in platelet numbers, frequent large platelets, decrease in white cells and slight increase in monocytes.  No blast cells were seen.  No schistocytes or spherocytes were seen..    -Based on this clinical picture and peripheral smear it looks like patient may have pancytopenia from  possible hypersplenism from cirrhosis of the liver.  His CT of abdomen shows splenomegaly and cirrhotic changes.  -I agree with further GI workup as planned by Dr. Janese Banks.  Will continue to maintain his hemoglobin above 7.  Patient does not need any platelet transfusion at this point.    Will continue to follow this patient with you.      This dictation was done by dragon, Acupuncturist.  Often unanticipated grammatical, syntax, phones and other interpretive errors are inadvertently transcribed.  Please excuse errors that have escaped final proofreading.     Signed By: Andris Flurry, MD     April 06, 2022

## 2022-04-07 LAB — CBC WITH AUTO DIFFERENTIAL
Absolute Immature Granulocyte: 0.1 10*3/uL — ABNORMAL HIGH (ref 0.00–0.04)
Basophils %: 0 % (ref 0–1)
Basophils Absolute: 0 10*3/uL (ref 0.0–0.1)
Eosinophils %: 0 % (ref 0–7)
Eosinophils Absolute: 0 10*3/uL (ref 0.0–0.4)
Hematocrit: 23.2 % — ABNORMAL LOW (ref 36.6–50.3)
Hemoglobin: 7.3 g/dL — ABNORMAL LOW (ref 12.1–17.0)
Immature Granulocytes: 2 % — ABNORMAL HIGH (ref 0–0.5)
Lymphocytes %: 26 % (ref 12–49)
Lymphocytes Absolute: 0.8 10*3/uL (ref 0.8–3.5)
MCH: 24.7 PG — ABNORMAL LOW (ref 26.0–34.0)
MCHC: 31.5 g/dL (ref 30.0–36.5)
MCV: 78.4 FL — ABNORMAL LOW (ref 80.0–99.0)
MPV: 9.9 FL (ref 8.9–12.9)
Monocytes %: 15 % — ABNORMAL HIGH (ref 5–13)
Monocytes Absolute: 0.5 10*3/uL (ref 0.0–1.0)
Neutrophils %: 57 % (ref 32–75)
Neutrophils Absolute: 1.8 10*3/uL (ref 1.8–8.0)
Nucleated RBCs: 0 PER 100 WBC
Platelets: 88 10*3/uL — ABNORMAL LOW (ref 150–400)
RBC: 2.96 M/uL — ABNORMAL LOW (ref 4.10–5.70)
RDW: 18 % — ABNORMAL HIGH (ref 11.5–14.5)
WBC: 3.2 10*3/uL — ABNORMAL LOW (ref 4.1–11.1)
nRBC: 0 10*3/uL (ref 0.00–0.01)

## 2022-04-07 LAB — RENAL FUNCTION PANEL
Albumin: 1.9 g/dL — ABNORMAL LOW (ref 3.5–5.0)
Anion Gap: 5 mmol/L (ref 5–15)
BUN: 14 mg/dL (ref 6–20)
Bun/Cre Ratio: 25 — ABNORMAL HIGH (ref 12–20)
CO2: 24 mmol/L (ref 21–32)
Calcium: 7.3 mg/dL — ABNORMAL LOW (ref 8.5–10.1)
Chloride: 107 mmol/L (ref 97–108)
Creatinine: 0.57 mg/dL — ABNORMAL LOW (ref 0.70–1.30)
Est, Glom Filt Rate: >60 mL/min/{1.73_m2} (ref >60)
Glucose: 97 mg/dL (ref 65–100)
Phosphorus: 2.6 mg/dL (ref 2.6–4.7)
Potassium: 3.6 mmol/L (ref 3.5–5.1)
Sodium: 136 mmol/L (ref 136–145)

## 2022-04-07 LAB — MAGNESIUM: Magnesium: 2 mg/dL (ref 1.6–2.4)

## 2022-04-07 LAB — PROTIME-INR
INR: 1.4 — ABNORMAL HIGH (ref 0.9–1.1)
Protime: 17.4 s — ABNORMAL HIGH (ref 11.9–14.6)

## 2022-04-07 LAB — AMMONIA: Ammonia: 19 umol/L (ref <32)

## 2022-04-07 MED ORDER — LIDOCAINE HCL 2 % IJ SOLN (MIXTURES ONLY)
2 % | INTRAMUSCULAR | Status: DC | PRN
Start: 2022-04-07 — End: 2022-04-07
  Administered 2022-04-07: 18:00:00 100 via INTRAVENOUS

## 2022-04-07 MED ORDER — LACTATED RINGERS IV SOLN
INTRAVENOUS | Status: DC | PRN
Start: 2022-04-07 — End: 2022-04-07
  Administered 2022-04-07: 18:00:00 via INTRAVENOUS

## 2022-04-07 MED ORDER — PHENYLEPHRINE HCL 10 MG/ML SOLN (MIXTURES ONLY)
10 MG/ML | INTRAVENOUS | Status: DC | PRN
Start: 2022-04-07 — End: 2022-04-07
  Administered 2022-04-07 (×2): 100 via INTRAVENOUS

## 2022-04-07 MED ORDER — SODIUM CHLORIDE 0.9 % IV SOLN
0.9 | INTRAVENOUS | Status: DC | PRN
Start: 2022-04-07 — End: 2022-04-12

## 2022-04-07 MED ORDER — PROPOFOL 100 MG/10ML IV EMUL
100 MG/10ML | INTRAVENOUS | Status: DC | PRN
Start: 2022-04-07 — End: 2022-04-07
  Administered 2022-04-07: 18:00:00 30 via INTRAVENOUS
  Administered 2022-04-07: 18:00:00 50 via INTRAVENOUS
  Administered 2022-04-07 (×4): 30 via INTRAVENOUS

## 2022-04-07 MED ORDER — PEG 3350-KCL-NABCB-NACL-NASULF 236 G PO SOLR
236 g | Freq: Once | ORAL | Status: AC
Start: 2022-04-07 — End: 2022-04-07
  Administered 2022-04-07: 23:00:00 2000 mL via ORAL

## 2022-04-07 MED ORDER — MIDODRINE HCL 5 MG PO TABS
5 MG | Freq: Three times a day (TID) | ORAL | Status: DC
Start: 2022-04-07 — End: 2022-04-09
  Administered 2022-04-07 – 2022-04-09 (×6): 5 mg via ORAL

## 2022-04-07 MED ORDER — MIDODRINE HCL 5 MG PO TABS
5 MG | Freq: Four times a day (QID) | ORAL | Status: DC | PRN
Start: 2022-04-07 — End: 2022-04-12
  Administered 2022-04-08: 07:00:00 5 mg via ORAL

## 2022-04-07 MED FILL — SYMBICORT 160-4.5 MCG/ACT IN AERO: RESPIRATORY_TRACT | Qty: 4

## 2022-04-07 MED FILL — RANOLAZINE ER 500 MG PO TB12: 500 MG | ORAL | Qty: 1

## 2022-04-07 MED FILL — GAVILYTE-G 236 G PO SOLR: 236 g | ORAL | Qty: 4000

## 2022-04-07 NOTE — Plan of Care (Signed)
Problem: Discharge Planning  Goal: Discharge to home or other facility with appropriate resources  Outcome: Progressing     Problem: Skin/Tissue Integrity  Goal: Absence of new skin breakdown  Description: 1.  Monitor for areas of redness and/or skin breakdown  2.  Assess vascular access sites hourly  3.  Every 4-6 hours minimum:  Change oxygen saturation probe site  4.  Every 4-6 hours:  If on nasal continuous positive airway pressure, respiratory therapy assess nares and determine need for appliance change or resting period.  Outcome: Progressing     Problem: ABCDS Injury Assessment  Goal: Absence of physical injury  Outcome: Progressing     Problem: Safety - Adult  Goal: Free from fall injury  Outcome: Progressing     Problem: Chronic Conditions and Co-morbidities  Goal: Patient's chronic conditions and co-morbidity symptoms are monitored and maintained or improved  Outcome: Progressing     Problem: Physical Therapy - Adult  Goal: By Discharge: Performs mobility at highest level of function for planned discharge setting.  See evaluation for individualized goals.  Description: FUNCTIONAL STATUS PRIOR TO ADMISSION: The patient  required moderate assistance for basic and instrumental ADLs. and The patient was functional at the wheelchair level and required moderate assistance for transfers to the chair.    HOME SUPPORT PRIOR TO ADMISSION: The patient lived with son and reports requiring increased assistance over the past several weeks    Physical Therapy Goals  Initiated 04/07/2022  Pt stated goal: to get better  Pt will be I with LE HEP in 7 days.  Pt will perform bed mobility with Contact Guard Assist in 7 days.  Pt will perform transfers including stand pivot to chair with Contact Guard Assist in 7 days.   Pt will demonstrate improvement in standing balance from Maximal Assist to Contact Guard Assist in 7 days.      04/07/2022 1011 by Rozann Lesches, PT  Outcome: Progressing

## 2022-04-07 NOTE — Care Coordination-Inpatient (Addendum)
CM reviewed chart. Patient and family discharge plans are to go to [facility], has been accepted pending insurance auth. Will need PT/OT notes for insurance auth when appropriate.     Plans today are for EGD and colonoscopy.     1228: PT/OT notes are in and sent over to [facility] with request to please start auth

## 2022-04-07 NOTE — Progress Notes (Signed)
Problem: Physical Therapy - Adult  Goal: By Discharge: Performs mobility at highest level of function for planned discharge setting.  See evaluation for individualized goals.  Description: FUNCTIONAL STATUS PRIOR TO ADMISSION: The patient  required moderate assistance for basic and instrumental ADLs. and The patient was functional at the wheelchair level and required moderate assistance for transfers to the chair.    HOME SUPPORT PRIOR TO ADMISSION: The patient lived with son and reports requiring increased assistance over the past several weeks    Physical Therapy Goals  Initiated 04/07/2022  Pt stated goal: to get better  Pt will be I with LE HEP in 7 days.  Pt will perform bed mobility with Contact Guard Assist in 7 days.  Pt will perform transfers including stand pivot to chair with Contact Guard Assist in 7 days.   Pt will demonstrate improvement in standing balance from Maximal Assist to Contact Guard Assist in 7 days.      Outcome: Progressing   PHYSICAL THERAPY EVALUATION  Patient: Carlos Petersen (71 y.o. male)  Date: 04/07/2022  Primary Diagnosis: GI bleed [K92.2]  Gastrointestinal hemorrhage, unspecified gastrointestinal hemorrhage type [K92.2]  Anemia, unspecified type [D64.9]  Procedure(s) (LRB):  EGD ESOPHAGOGASTRODUODENOSCOPY (N/A)  COLONOSCOPY DIAGNOSTIC (N/A)     Precautions: Restrictions/Precautions  Restrictions/Precautions: Fall Risk, Bed Alarm     In place during session: Peripheral IV, External Catheter, and EKG/telemetry   ASSESSMENT  Pt is a 71 y.o. male admitted on 04/05/2022 for weakness and dizziness; pt currently being treated for acute blood loss anemia/GI bleed, hypokalemia, HLD, h/o venous thromboembolism, h/o afib, peripheral neuropathy. Pt semi-supine in bed upon PT arrival, agreeable to evaluation. Pt A&O x 4.     Based on the objective data described below, the patient currently presents with impaired functional mobility, decreased independence in ADLs, decreased ROM, impaired  strength, poor safety awareness, impaired balance, impaired posture, and increased pain levels. (See below for objective details and assist levels).     Overall pt tolerated session fair today with c/o 10/10 left hip pain with mobility.  Pt required mod A to max A x2 for bed mobility and transfers this session. Pt completed sit <> stand transfers x2 attempts with RW, gt belt, and max A x2 with difficulty reaching full standing position; unable to advance feet at this time, so unable to transfer to chair at this time. Pt required increased time and encouragement to complete all transfers a this time. Pt will benefit from continued skilled PT to address above deficits and return to PLOF. Current PT DC recommendation Skilled Nursing Facility once medically appropriate.     Start of Session End of Session   SPO2 (%) 94 98   Heart Rate (BPM) 93 95       Current Level of Function Impacting Discharge (mobility/balance): mod to max Ax2    Other factors to consider for discharge: patient's current support system is unable to meet their requirements for physical assistance, high risk for falls, not safe to be alone, and concern for safely navigating or managing the home environment     PLAN :  Recommendations and Planned Interventions: bed mobility training, transfer training, therapeutic exercises, neuromuscular re-education, patient and family training/education, and therapeutic activities    Recommend for staff: Encourage HEP in prep for ADLs/mobility and Frequent repositioning to prevent skin breakdown    Frequency/Duration: Patient will be followed by physical therapy:  2-3x/week to address goals.    Recommendation for discharge: (in order for the  patient to meet his/her long term goals)  Skilled Nursing Facility    IF patient discharges home will need the following DME: continuing to assess with progress         SUBJECTIVE:   Patient stated "since my hip surgery i've been getting weaker."    OBJECTIVE DATA SUMMARY:    HISTORY:    Past Medical History:   Diagnosis Date    Cerebral artery occlusion with cerebral infarction (HCC)     COPD (chronic obstructive pulmonary disease) (HCC)     Depression     Diabetes (HCC)     Emphysema lung (HCC)     Heart disease     Hypercholesterolemia     Parkinson disease (HCC)     Pulmonary embolism (HCC)      Past Surgical History:   Procedure Laterality Date    CARDIAC DEFIBRILLATOR PLACEMENT      COLONOSCOPY      PACEMAKER      PACEMAKER PLACEMENT      TOTAL HIP ARTHROPLASTY Right 01/2016       Home Situation:  Social/Functional History  Lives With: Son  Type of Home: House  Home Layout: One level  Home Access: Level entry  Bathroom Shower/Tub: Medical sales representative: Standard  Bathroom Equipment: 3-in-1 commode  Bathroom Accessibility: Accessible  Home Equipment: Environmental consultant, standard, Wheelchair-electric, Hospital bed  Receives Help From: Family  ADL Assistance: Needs assistance  Toileting: Needs assistance  Homemaking Assistance: Needs assistance  Homemaking Responsibilities: Yes  Ambulation Assistance: Needs assistance (Walker/electric w/c.)  Transfer Assistance: Needs assistance  Active Driver: No  Occupation: Retired    Cognitive/Behavioral Status:  Orientation  Orientation Level: Oriented X4       Skin: healed surgical incision noted right LE    Edema: none noted      Strength:    Strength: Generally decreased, functional    Range Of Motion:  AROM: Generally decreased, functional  PROM: Generally decreased, functional    Functional Mobility:  Bed Mobility:     Bed Mobility Training  Bed Mobility Training: Yes  Supine to Sit: Moderate assistance;Additional time  Sit to Supine: Moderate assistance;Additional time  Scooting: Contact-guard assistance;Additional time  Transfers:     Art therapist: Yes  Sit to Stand: Maximum assistance;Assist X2  Stand to Sit: Maximum assistance;Assist X2  Stand Pivot Transfers: Maximum assistance (to attempt stand pivot transfer  to char; unable to complete)  Balance:               Balance  Sitting: Impaired  Sitting - Static: Fair (occasional)  Sitting - Dynamic: Fair (occasional)  Standing: Impaired  Standing - Static: Constant support;Poor    Ambulation/Gait Training:  Tax inspector:  (unable to advance feet at this time)                       Gait  Assistive Device: Gait belt;Walker, rolling                   Therapeutic Exercises:   Pt would benefit from LE HEP to improve overall LE AROM/strength and can be further educated in next treatment session.    Functional Measure:  [facility] AM-PACT "6 Clicks"         Basic Mobility Inpatient Short Form  How much difficulty does the patient currently have... Unable A Lot A Little None   1.  Turning over in bed (including adjusting  bedclothes, sheets and blankets)?   []  1   [x]  2   []  3   []  4   2.  Sitting down on and standing up from a chair with arms ( e.g., wheelchair, bedside commode, etc.)   []  1   [x]  2   []  3   []  4   3.  Moving from lying on back to sitting on the side of the bed?   []  1   [x]  2   []  3   []  4          How much help from another person does the patient currently need... Total A Lot A Little None   4.  Moving to and from a bed to a chair (including a wheelchair)?   []  1   [x]  2   []  3   []  4   5.  Need to walk in hospital room?   [x]  1   []  2   []  3   []  4   6.  Climbing 3-5 steps with a railing?   [x]  1   []  2   []  3   []  4    2007, Trustees of , under license to [place], [place]. All rights reserved     Score:  Initial: 10/24 Most Recent: X (Date: 04/07/2022 )   Interpretation of Tool:  Represents activities that are increasingly more difficult (i.e. Bed mobility, Transfers, Gait).  Score 24 23 22-20 19-15 14-10 9-7 6   Modifier CH CI CJ CK CL CM CN         Physical Therapy Evaluation Charge Determination   History Examination Presentation Decision-Making   HIGH Complexity :3+ comorbidities / personal factors will impact the outcome/  POC  HIGH Complexity : 4+ Standardized tests and measures addressing body structure, function, activity limitation and / or participation in recreation  MEDIUM Complexity : Evolving with changing characteristics  Other outcome measures ampac 6  MEDIUM      Based on the above components, the patient evaluation is determined to be of the following complexity level: MEDIUM    Pain Rating:  10/10 right hip  Pain Intervention(s):   nursing notified and addressing, rest, elevation, and repositioning    Activity Tolerance:   Fair , requires frequent rest breaks, SpO2 stable on room air, and elevated pain with mobility    After treatment patient left in no apparent distress:   Bed locked and in lowest position Patient left in no apparent distress in bed, Call bell within reach, Bed/ chair alarm activated, and Side rails x3 and nsg updated.    COMMUNICATION/EDUCATION:   The patient's plan of care was discussed with: Occupational therapist, Registered nurse, and Case management     Patient Education  Education Given To: Patient  Education Provided: Role of Therapy;Plan of Care;Family ;Fall Prevention Strategies  Education Method: Verbal;Demonstration  Education Outcome: Verbalized understanding;Demonstrated understanding;Continued education needed    PT/OT sessions occurred partially together for increased safety of pt and clinician due to pt requiring increased assistance for OOB transfers.     Thank you for this referral.  , PT, DPT  Minutes: 10

## 2022-04-07 NOTE — Progress Notes (Signed)
Called Dr. Skeet Simmer again  to notify him that patient did not complete his colonoscopy preparation drink but got no answer.

## 2022-04-07 NOTE — Progress Notes (Signed)
Patient has not completed his bowel preparation and has not drunk his golytely bowel prep drink despite numerous times of encouragement and persuasion. Contacted Dr. Skeet Simmer on telephone but got no response.

## 2022-04-07 NOTE — Anesthesia Post-Procedure Evaluation (Signed)
Department of Anesthesiology  Postprocedure Note    Patient: Carlos Petersen  MRN: [id number]  Birthdate: [birth year]  Date of evaluation: 04/12/2022      Procedure Summary     Date: 04/07/22 Room / Location: [facility] / [facility]    Anesthesia Start: 1354 Anesthesia Stop: 2947    Procedures:       EGD ESOPHAGOGASTRODUODENOSCOPY (Upper GI Region)      COLONOSCOPY DIAGNOSTIC (Lower GI Region)      COLONOSCOPY POLYPECTOMY SNARE/COLD BIOPSY (Lower GI Region) Diagnosis:       Gastrointestinal hemorrhage, unspecified gastrointestinal hemorrhage type      (Gastrointestinal hemorrhage, unspecified gastrointestinal hemorrhage type [K92.2])    Surgeons: Crisoforo Oxford, MD Responsible Provider: Donivan Scull., MD    Anesthesia Type: MAC, TIVA ASA Status: 4          Anesthesia Type: MAC, TIVA    Aldrete Phase I: Aldrete Score: 9    Aldrete Phase II: Aldrete Score: 10      Anesthesia Post Evaluation    Patient location during evaluation: bedside (Endoscopy Unit)  Patient participation: complete - patient participated  Level of consciousness: sleepy but conscious  Pain score: 0  Airway patency: patent  Nausea & Vomiting: no nausea and no vomiting  Complications: no  Cardiovascular status: hemodynamically stable  Respiratory status: acceptable  Hydration status: stable  Comments: This patient remained on the stretcher.  The patient was handed off to the endoscopy nursing team.  All questions regarding pre-, intra-, and postoperative care were answered.  Multimodal analgesia pain management approach

## 2022-04-07 NOTE — Progress Notes (Signed)
OCCUPATIONAL THERAPY EVALUATION  Patient: Carlos Petersen (71 y.o. male)  Date: 04/07/2022  Primary Diagnosis: GI bleed [K92.2]  Gastrointestinal hemorrhage, unspecified gastrointestinal hemorrhage type [K92.2]  Anemia, unspecified type [D64.9]  Procedure(s) (LRB):  EGD ESOPHAGOGASTRODUODENOSCOPY (N/A)  COLONOSCOPY DIAGNOSTIC (N/A)     Precautions: Fall Risk, Bed Alarm                In place during session:Peripheral IV, External Catheter, and EKG/telemetry     ASSESSMENT  Pt is a 71 y.o. male presenting to [facility] with c/o weakness and dizziness, admitted 8/7 and currently being treated for acute blood loss anemia, hypokalemia, and HLD. Pt has received blood transfusion during admission and currently awaiting colonoscopy. Pt received semi-supine in bed upon arrival, AXO x4, and agreeable to OT evaluation.     Based on current observations, pt presents with decreased  functional mobility, independence in ADLs, ROM, strength, activity tolerance, endurance, balance, increased pain levels (see below for objective details and assist levels).     Overall, pt tolerates session fair with c/o slight dizziness w/ change in position and pain in hips during mobility. Pt req'd mod A for sup>sit transfer and max A x1-2 for SPT and STS transfer; attempted SPT to recliner but unable to complete at pt demo's difficulty bearing weight through BLE. PT entered room and attempted STS using RW but pt unable to maintain standing safely. He tolerated sitting EOB for ~5-8 minutes and completed facial hygiene w/ set-up. Returned to supine w/ all needs in reach. Pt will benefit from continued skilled OT services to address current impairments and improve IND and safety with self cares and functional transfers/mobility. Current OT d/c recommendation Skilled [place] medically appropriate.     Start of Session End of Session   SPO2 (%) 94 98   Heart Rate (BPM) 93 95       Current Level of Function Impacting Discharge:     Other factors  to consider for discharge: family/social support, DME, time since onset, severity of deficits, functional baseline     Patient will benefit from skilled therapy intervention to address the above noted impairments.       PLAN :  Recommendations and Planned Interventions: self care training, therapeutic activities, functional mobility training, balance training, therapeutic exercise, endurance activities, and patient education    Recommend with staff: Amb to bathroom for toileting with gt belt and AD    Recommend next session: LB dressing, LB bathing, Seated grooming, and standing grooming    Frequency/Duration: Patient will be followed by occupational therapy:  3-5x/week to address goals.    Recommendation for discharge: (in order for the patient to meet his/her long term goals)  [facility]    IF patient discharges home will need the following DME: TBD       SUBJECTIVE:   Patient stated "I just feel so weak."    OBJECTIVE DATA SUMMARY:     Past Medical History:   Diagnosis Date    Cerebral artery occlusion with cerebral infarction ([place])     COPD (chronic obstructive pulmonary disease) ([place])     Depression     Diabetes ([place])     Emphysema lung ([place])     Heart disease     Hypercholesterolemia     Parkinson disease ([place])     Pulmonary embolism ([place])      Past Surgical History:   Procedure Laterality Date    CARDIAC DEFIBRILLATOR PLACEMENT      COLONOSCOPY  PACEMAKER      PACEMAKER PLACEMENT      TOTAL HIP ARTHROPLASTY Right 01/2016          Expanded or extensive additional review of patient history:   Lives With: Son  Type of Home: House  Home Layout: One level  Home Access: Level entry        Bathroom Shower/Tub: Administrator, Civil Service: Standard  Bathroom Equipment: 3-in-1 commode  Bathroom Accessibility: Accessible  Home Equipment: Environmental consultant, standard, Wheelchair-electric, Hospital bed     Social/Functional History  Lives With: Son  Type of Home: House  Home Layout: One level  Home Access:  Level entry  Bathroom Shower/Tub: Administrator, Civil Service: Soil scientist: 3-in-1 commode  Bathroom Accessibility: Accessible  Home Equipment: Environmental consultant, standard, [facility] bed  Receives Help From: Family  ADL Assistance: Needs assistance  Toileting: Needs assistance  Homemaking Assistance: Needs assistance  Homemaking Responsibilities: Yes  Ambulation Assistance: Needs assistance (Walker/electric w/c.)  Transfer Assistance: Needs assistance  Active Driver: No  Occupation: Retired      EXAMINATION OF PERFORMANCE DEFICITS:    Cognitive/Behavioral Status:  Orientation  Orientation Level: Oriented X4     Range of Motion:   AROM: Generally decreased, functional       Strength:  Strength: Generally decreased, functional      Functional Mobility and Transfers for ADLs:  Bed Mobility:  Bed Mobility Training  Bed Mobility Training: Yes  Supine to Sit: Moderate assistance;Additional time  Sit to Supine: Moderate assistance;Additional time  Scooting: Contact-guard assistance;Additional time    Transfers:  Pharmacologist: Yes  Sit to Stand: Maximum assistance;Assist X2  Stand to Sit: Maximum assistance;Assist X2  Stand Pivot Transfers: Maximum assistance (to attempt stand pivot transfer to char; unable to complete)      Balance:  Balance  Sitting: Impaired  Sitting - Static: Fair (occasional)  Sitting - Dynamic: Fair (occasional)  Standing: Impaired  Standing - Static: Constant support;Poor      ADL Assessment:                  Grooming: Contact guard assistance;Stand by assistance  Grooming Skilled Clinical Factors: for facial hygiene at EOB                            LE Dressing: Dependent/Total  LE Dressing Skilled Clinical Factors: at adjust socks                       Functional Measure:    KB Home	[place] AM-PACTM "6 Clicks"                                                       Daily Activity Inpatient Short Form  How much help from another person does the  patient currently need... Total; A Lot A Little None   1.  Putting on and taking off regular lower body clothing? '[]'   1 '[x]'   2 '[]'   3 '[]'   4   2.  Bathing (including washing, rinsing, drying)? '[]'   1 '[x]'   2 '[]'   3 '[]'   4   3.  Toileting, which includes using toilet, bedpan or urinal? '[]'  1 '[x]'   2 '[]'   3 '[]'   4  4.  Putting on and taking off regular upper body clothing? '[]'   1 '[]'   2 '[x]'   3 '[]'   4   5.  Taking care of personal grooming such as brushing teeth? '[]'   1 '[]'   2 '[x]'   3 '[]'   4   6.  Eating meals? '[]'   1 '[]'   2 '[x]'   3 '[]'   4    2007, Trustees of [place], under license to [place]. All rights reserved     Score: 15/24     Interpretation of Tool:  Represents clinically-significant functional categories (i.e. Activities of daily living).  Percentage of Impairment CH    0%   CI    1-19% CJ    20-39% CK    40-59% CL    60-79% CM    80-99% CN     100%   AMPAC  Score 6-'24 24 23 ' 20-22 15-19 10-14 7-9 6     Occupational Therapy Evaluation Charge Determination   History Examination Decision-Making   LOW Complexity : Brief history review  LOW Complexity: 1-3 Performance deficits relating to physical, cognitive, or psychosocial skills that result in activity limitations and/or participation restrictions MEDIUM Complexity: Patient may present with comorbidities that affect occupational performance. Minimal to moderate modifications of tasks or assist (eg. physical or verbal) with assist is necessary to enable pt to complete eval      Based on the above components, the patient evaluation is determined to be of the following complexity level: Low    Pain Rating:  8/10 in hips   Pain Intervention(s):   Written on board    Activity Tolerance:   Fair  and requires rest breaks    After treatment patient left in no apparent distress:    Patient left in no apparent distress in bed, Call bell within reach, Bed/ chair alarm activated, and Side rails x3, bed locked and in lowest position    COMMUNICATION/EDUCATION:   The patient's  plan of care was discussed with: Physical therapist and Registered nurse    Patient Education  Education Given To: Patient  Education Provided: Role of Therapy;Plan of Care  Education Method: Verbal;Demonstration  Education Outcome: Verbalized understanding;Demonstrated understanding    The supervising occupational therapist and treating occupational therapist assistant have met to review this patient's progress and plan of care.    This patient's plan of care is appropriate for delegation to OTA.     PT/OT sessions occurred together for increased safety of pt and clinician.     Thank you for this referral,  Dewain Penning, OT  Minutes: 28     Problem: Occupational Therapy - Adult  Goal: By Discharge: Performs self-care activities at highest level of function for planned discharge setting.  See evaluation for individualized goals.  Description: FUNCTIONAL STATUS PRIOR TO ADMISSION:  Pt reports he has progressively gotten weaker over time; he sometimes is able to transfer to [facility] on his own but mostly his son A. He req's A for ADLs.     HOME SUPPORT: Pt lives w/ son; son is not home 24/7.     Occupational Therapy Goals:  Initiated 04/07/2022  Patient/Family stated goal: I want to get better  Patient will perform grooming in sitting with Set-up within 7 day(s).  Patient will perform UB bathing with Contact Guard Assist within 7 day(s).  Patient will perform LB bathing with Minimal Assist within 7 day(s).  Patient will perform toilet transfers with Minimal Assist  within 7 day(s).  Patient will perform all aspects of toileting with Minimal Assist within 7 day(s).  Patient will participate in upper extremity therapeutic exercise/activities with Independence within 7 day(s).    Outcome: Progressing

## 2022-04-07 NOTE — Anesthesia Pre-Procedure Evaluation (Addendum)
Department of Anesthesiology  Preprocedure Note       Name:  Carlos Petersen   Age:  71 y.o.  DOB:  12/12/1950                                          MRN:  [id number]         Date:  04/07/2022      Surgeon: Juliann Mule):  Crisoforo Oxford, MD    Procedure: Procedure(s):  EGD ESOPHAGOGASTRODUODENOSCOPY  COLONOSCOPY DIAGNOSTIC    Medications prior to admission:   Prior to Admission medications    Medication Sig Start Date End Date Taking? Authorizing Provider   ferrous sulfate (IRON 325) 325 (65 Fe) MG tablet Take 1 tablet by mouth daily (with breakfast) 02/01/22 03/03/22  Jan A Zarefoss, APRN - NP   cholestyramine Lucrezia Starch) 4 g packet Take 1 packet by mouth 3 times daily (with meals) for 7 days 01/27/22 02/03/22  Ahmed I Moreen Fowler, MD   VORTIoxetine HBr (TRINTELLIX) 20 MG TABS tablet Take 1 tablet by mouth daily    Historical Provider, MD   fluticasone-umeclidin-vilant (TRELEGY ELLIPTA) 100-62.5-25 MCG/ACT AEPB inhaler Inhale 1 puff into the lungs daily 11/17/21   Ar Automatic Reconciliation   alendronate (FOSAMAX) 70 MG tablet Take 1 tablet by mouth every 7 days On Wednesday    Historical Provider, MD   albuterol sulfate HFA (PROVENTIL;VENTOLIN;PROAIR) 108 (90 Base) MCG/ACT inhaler Inhale 2 puffs into the lungs every 6 hours as needed    Historical Provider, MD   amiodarone (CORDARONE) 200 MG tablet Take 1 tablet by mouth daily 11/17/21   Historical Provider, MD   apixaban (ELIQUIS) 5 MG TABS tablet Take 1 tablet by mouth 2 times daily 11/17/21   Historical Provider, MD   carbidopa-levodopa (SINEMET CR) 50-200 MG per extended release tablet Take 2 tablets by mouth 3 times daily 11/17/21 11/12/22  Historical Provider, MD   carvedilol (COREG) 6.25 MG tablet Take 1 tablet by mouth 2 times daily 11/17/21   Historical Provider, MD   JARDIANCE 10 MG tablet Take 1 tablet by mouth daily 11/18/21   Historical Provider, MD   gabapentin (NEURONTIN) 100 MG capsule Take 1 capsule by mouth in the morning and at bedtime. 12/23/21   Historical Provider,  MD   omeprazole (PRILOSEC) 40 MG delayed release capsule Take 1 capsule by mouth daily    Historical Provider, MD   ranolazine (RANEXA) 500 MG extended release tablet Take 1 tablet by mouth 2 times daily 11/27/21   Historical Provider, MD   rosuvastatin (CRESTOR) 20 MG tablet Take 1 tablet by mouth daily 11/27/21 11/22/22  Historical Provider, MD       Current medications:    Current Facility-Administered Medications   Medication Dose Route Frequency Provider Last Rate Last Admin   . midodrine (PROAMATINE) tablet 5 mg  5 mg Oral TID WC Sami U Haq, MD       . midodrine (PROAMATINE) tablet 5 mg  5 mg Oral Q6H PRN Sami U Haq, MD       . dextrose 5 % and 0.45 % NaCl with KCl 20 mEq infusion   IntraVENous Continuous Sami Theresia Lo, MD 50 mL/hr at 04/06/22 1608 Carlos Bag at 04/06/22 1608   . 0.9 % sodium chloride infusion   IntraVENous PRN Sandrea Hughs, MD       . 0.9 % sodium chloride  infusion   IntraVENous PRN Ahmed Ned Card, MD       . pantoprazole (PROTONIX) injection 40 mg  40 mg IntraVENous BID Ahmed Ned Card, MD   40 mg at 04/07/22 1006   . albuterol sulfate HFA (PROVENTIL;VENTOLIN;PROAIR) 108 (90 Base) MCG/ACT inhaler 2 puff  2 puff Inhalation Q6H PRN Ahmed Ned Card, MD       . amiodarone (CORDARONE) tablet 200 mg  200 mg Oral Daily Ahmed Ned Card, MD   200 mg at 04/07/22 1007   . carbidopa-levodopa (SINEMET CR) 25-100 MG per extended release tablet 4 tablet  4 tablet Oral TID Ahmed Ned Card, MD   4 tablet at 04/07/22 1006   . ferrous sulfate (IRON 325) tablet 325 mg  325 mg Oral Daily with breakfast Ahmed Ned Card, MD   325 mg at 04/07/22 1006   . gabapentin (NEURONTIN) capsule 100 mg  100 mg Oral BID Ahmed Ned Card, MD   100 mg at 04/07/22 1006   . ranolazine (RANEXA) extended release tablet 500 mg  500 mg Oral BID Kathlynn Grate, MD   500 mg at 04/06/22 2337   . rosuvastatin (CRESTOR) tablet 20 mg  20 mg Oral Nightly Ahmed Ned Card,  MD   20 mg at 04/06/22 2154   . VORTIoxetine HBr (TRINTELLIX) tablet 20 mg  20 mg Oral Daily Ahmed Ned Card, MD       . sodium chloride flush 0.9 % injection 5-40 mL  5-40 mL IntraVENous 2 times per day Ahmed Ned Card, MD   10 mL at 04/07/22 1007   . sodium chloride flush 0.9 % injection 5-40 mL  5-40 mL IntraVENous PRN Ahmed Ned Card, MD       . 0.9 % sodium chloride infusion   IntraVENous PRN Ahmed Ned Card, MD       . ondansetron (ZOFRAN-ODT) disintegrating tablet 4 mg  4 mg Oral Q8H PRN Ahmed Ned Card, MD        Or   . ondansetron [facility]) injection 4 mg  4 mg IntraVENous Q6H PRN Ahmed Ned Card, MD       . polyethylene glycol [facility] packet 17 g  17 g Oral Daily PRN Ahmed Ned Card, MD       . acetaminophen (TYLENOL) tablet 650 mg  650 mg Oral Q6H PRN Ahmed Ned Card, MD        Or   . acetaminophen (TYLENOL) suppository 650 mg  650 mg Rectal Q6H PRN Ahmed Ned Card, MD       . budesonide-formoterol [facility]) 160-4.5 MCG/ACT inhaler 2 puff  2 puff Inhalation BID RT Ahmed Ned Card, MD   2 puff at 04/07/22 0824    And   . tiotropium (SPIRIVA RESPIMAT) 2.5 MCG/ACT inhaler 2 puff  2 puff Inhalation Daily RT Ahmed Ned Card, MD   2 puff at 04/07/22 8099       Allergies:  No Known Allergies    Problem List:    Patient Active Problem List   Diagnosis Code   . 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   . Permanent atrial fibrillation (HCC) I48.21   . Acute pain of right shoulder M25.511   . Major depressive disorder, recurrent, unspecified (Carlos Petersen) F33.9   . Tobacco dependency F17.200   . Gastroesophageal reflux disease with esophagitis  without hemorrhage K21.00   . Depression F32.A   . Fall W19.Carlos Petersen   . Pacemaker Z95.0   . Tobacco abuse counseling Z71.6   . Mixed hyperlipidemia E78.2   . Parkinson's disease (Carlos Petersen) G20   . Major depressive disorder,  recurrent, moderate (HCC) F33.1   . Major depressive disorder, recurrent, mild (HCC) F33.0   . Hypertensive heart disease I11.9   . Chronic renal disease, stage III (HCC) N18.30   . Hypotension due to drugs I95.2   . Severe obesity (BMI 35.0-39.9) with comorbidity ([place]) E66.01   . PAD (peripheral artery disease) (HCC) I73.9   . Hip fracture ([place]) S72.009A   . AMS (altered mental status) R41.82   . C. difficile colitis A04.72   . Diarrhea R19.7   . Diabetes ([place]) E11.9   . Generalized weakness R53.1   . Iron deficiency anemia D50.9   . GI bleed K92.2       Past Medical History:        Diagnosis Date   . Cerebral artery occlusion with cerebral infarction ([place])    . COPD (chronic obstructive pulmonary disease) ([place])    . Depression    . Diabetes ([place])    . Emphysema lung ([place])    . Heart disease    . Hypercholesterolemia    . Parkinson disease ([place])    . Pulmonary embolism [facility])        Past Surgical History:        Procedure Laterality Date   . CARDIAC DEFIBRILLATOR PLACEMENT     . COLONOSCOPY     . PACEMAKER     . PACEMAKER PLACEMENT     . TOTAL HIP ARTHROPLASTY Right 01/2016       Social History:    Social History     Tobacco Use   . Smoking status: Every Day     Packs/day: 0.50     Types: Cigarettes   . Smokeless tobacco: Never   Substance Use Topics   . Alcohol use: Never                                Ready to quit: Not Answered  Counseling given: Not Answered      Vital Signs (Current):   Vitals:    04/07/22 0804 04/07/22 0827 04/07/22 0830 04/07/22 1105   BP: (!) 102/46      Pulse: 83      Resp: 20      Temp: 97.8 F (36.6 C)      TempSrc: Oral      SpO2: 100% 100% 100%    Weight:       Height:    1.803 m (_0 )                                              BP Readings from Last 3 Encounters:   04/07/22 (!) 102/46   02/01/22 127/61   01/22/22 125/73       NPO Status:  BMI:   Wt Readings from Last 3 Encounters:   04/06/22 67.5 kg  (148 lb 13 oz)   01/26/22 95.3 kg (210 lb)   01/22/22 90.7 kg (200 lb)     Body mass index is 20.75 kg/m.    CBC:   Lab Results   Component Value Date/Time    WBC 3.2 04/07/2022 04:39 AM    RBC 2.96 04/07/2022 04:39 AM    HGB 7.3 04/07/2022 04:39 AM    HCT 23.2 04/07/2022 04:39 AM    MCV 78.4 04/07/2022 04:39 AM    RDW 18.0 04/07/2022 04:39 AM    PLT 88 04/07/2022 04:39 AM       CMP:   Lab Results   Component Value Date/Time    NA 136 04/07/2022 04:39 AM    K 3.6 04/07/2022 04:39 AM    CL 107 04/07/2022 04:39 AM    CO2 24 04/07/2022 04:39 AM    BUN 14 04/07/2022 04:39 AM    CREATININE 0.57 04/07/2022 04:39 AM    AGRATIO 0.4 12/17/2021 10:39 AM    LABGLOM >60 04/07/2022 04:39 AM    LABGLOM 59 01/07/2021 11:00 AM    GLUCOSE 97 04/07/2022 04:39 AM    PROT 6.7 04/06/2022 02:55 AM    PROT 6.8 04/06/2022 02:55 AM    CALCIUM 7.3 04/07/2022 04:39 AM    BILITOT 1.0 04/06/2022 02:55 AM    BILITOT 1.0 04/06/2022 02:55 AM    ALKPHOS 57 04/06/2022 02:55 AM    ALKPHOS 57 04/06/2022 02:55 AM    ALKPHOS 82 12/17/2021 10:39 AM    AST 19 04/06/2022 02:55 AM    AST 19 04/06/2022 02:55 AM    ALT <6 04/06/2022 02:55 AM    ALT <6 04/06/2022 02:55 AM       POC Tests: No results for input(s): POCGLU, POCNA, POCK, POCCL, POCBUN, POCHEMO, POCHCT in the last 72 hours.    Coags:   Lab Results   Component Value Date/Time    PROTIME 17.4 04/07/2022 04:39 AM    INR 1.4 04/07/2022 04:39 AM       HCG (If Applicable): No results found for: PREGTESTUR, PREGSERUM, HCG, HCGQUANT     ABGs: No results found for: PHART, PO2ART, PCO2ART, HCO3ART, BEART, O2SATART     Type & Screen (If Applicable):  No results found for: LABABO, LABRH    Drug/Infectious Status (If Applicable):  No results found for: HIV, HEPCAB    COVID-19 Screening (If Applicable): No results found for: COVID19        Anesthesia Evaluation  Patient summary reviewed and Nursing notes reviewed no history of anesthetic complications:   Airway: Mallampati: II  TM distance: >3 FB   Neck ROM:  full  Mouth opening: > = 3 FB   Dental:    (+) edentulous      Pulmonary:normal exam  breath sounds clear to auscultation  (+) COPD: moderate,  current smoker                           Cardiovascular:    (+) pacemaker: AICD, hyperlipidemia      ECG reviewed  Rhythm: regular  Rate: normal  Echocardiogram reviewed               ROS comment: Interpretation Summary    Result status: Final result  This is a summary report. The complete report is available in the patient's medical record. If you cannot access the  medical record, please contact the sending organization for a detailed fax or copy.    .  LeftVentricle: Normal left ventricular systolic function with a visually estimated EF of 60 - 65%. EF by 2D Simpsons Biplane is 62%. Left ventricle size is normal. LVIDd index is 2.50 cm/m2. Normal wall thickness. Mass index 2D is 91.7 g/m2. Normal wall motion. Normal diastolic function.  .  Technical qualifiers: Echo study was technically difficult.       Neuro/Psych:   Negative Neuro/Psych ROS  (+) CVA:, psychiatric history:            GI/Hepatic/Renal:             Endo/Other:    (+) DiabetesType II DM, , .                 Abdominal:              PE comment: Deferred.   Vascular:   + DVT, PE.       Other Findings:           Anesthesia Plan      MAC and TIVA     ASA 4     (Standard ASA monitors: continuous EKG, BP, HR, pulse oximeter, and capnography.)  Induction: intravenous.    MIPS: Prophylactic antiemetics administered.  Anesthetic plan and risks discussed with patient (and family, if present.).                        Donivan Scull., MD   04/07/2022

## 2022-04-07 NOTE — Progress Notes (Signed)
Bowel prep done as ordered, stool appear clear to light brown.

## 2022-04-07 NOTE — Progress Notes (Signed)
Hematology and Oncology Progress Note    Patient: Carlos Petersen MRN: [id number]  SSN: [id number]    Date of Birth: 08/29/1951  Age: 71 y.o.  Sex: male      Admit Date: 04/05/2022    LOS: 2 days     Chief Complaint: Patient is feeling tired    Subjective:     Patient came from endoscopy so he is somewhat drowsy.  Patient is feeling tired.  No mucosal bleeding    Objective:     Vitals:    04/07/22 0425 04/07/22 0804 04/07/22 0827 04/07/22 0830   BP: (!) 92/47 (!) 102/46     Pulse: 76 83     Resp: 20 20     Temp: 97.7 F (36.5 C) 97.8 F (36.6 C)     TempSrc: Oral Oral     SpO2: 100% 100% 100% 100%   Weight:       Height:            Physical Exam:  Constitutional: Elderly white male looks chronically ill.  Not in any acute distress or pain.  Eyes: Sclerae anicteric. Conjunctivae shows pallor.  Patient is bitemporal wasting.  ENMT: Oral mucosa is moist, no thrush, mucositis, or petechiae.  Neck: No adenopathy.   Respiratory: Lungs are clear bilaterally.  Cardiovascular: Normal sinus rhythm.  Patient has defibrillator on left side chest wall.  Abdomen: Soft, nontender, no hepatosplenomegaly. No guarding or rigidity. Bowel sounds present.  Extremities: No edema.  Skin: No petechiae; no skin rash.  Neurologic: Alert/oriented x 3    Lab/Data Review:    Recent Results (from the past 24 hour(s))   Hemoglobin and Hematocrit    Collection Time: 04/06/22  4:28 PM   Result Value Ref Range    Hemoglobin 6.8 (L) 12.1 - 17.0 g/dL    Hematocrit 30.8 (L) 36.6 - 50.3 %   CBC with Auto Differential    Collection Time: 04/07/22  4:39 AM   Result Value Ref Range    WBC 3.2 (L) 4.1 - 11.1 K/uL    RBC 2.96 (L) 4.10 - 5.70 M/uL    Hemoglobin 7.3 (L) 12.1 - 17.0 g/dL    Hematocrit 65.7 (L) 36.6 - 50.3 %    MCV 78.4 (L) 80.0 - 99.0 FL    MCH 24.7 (L) 26.0 - 34.0 PG    MCHC 31.5 30.0 - 36.5 g/dL    RDW 84.6 (H) 96.2 - 14.5 %    Platelets 88 (L) 150 - 400 K/uL    MPV 9.9 8.9 - 12.9 FL    Nucleated RBCs 0.0 0.0 PER 100 WBC    nRBC 0.00  0.00 - 0.01 K/uL    Neutrophils % 57 32 - 75 %    Lymphocytes % 26 12 - 49 %    Monocytes % 15 (H) 5 - 13 %    Eosinophils % 0 0 - 7 %    Basophils % 0 0 - 1 %    Immature Granulocytes 2 (H) 0 - 0.5 %    Neutrophils Absolute 1.8 1.8 - 8.0 K/UL    Lymphocytes Absolute 0.8 0.8 - 3.5 K/UL    Monocytes Absolute 0.5 0.0 - 1.0 K/UL    Eosinophils Absolute 0.0 0.0 - 0.4 K/UL    Basophils Absolute 0.0 0.0 - 0.1 K/UL    Absolute Immature Granulocyte 0.1 (H) 0.00 - 0.04 K/UL    Differential Type AUTOMATED     Renal Function Panel  Collection Time: 04/07/22  4:39 AM   Result Value Ref Range    Sodium 136 136 - 145 mmol/L    Potassium 3.6 3.5 - 5.1 mmol/L    Chloride 107 97 - 108 mmol/L    CO2 24 21 - 32 mmol/L    Anion Gap 5 5 - 15 mmol/L    Glucose 97 65 - 100 mg/dL    BUN 14 6 - 20 mg/dL    Creatinine 4.13 (L) 0.70 - 1.30 mg/dL    Bun/Cre Ratio 25 (H) 12 - 20      Est, Glom Filt Rate >60 >60 ml/min/1.85m2    Calcium 7.3 (L) 8.5 - 10.1 mg/dL    Phosphorus 2.6 2.6 - 4.7 mg/dL    Albumin 1.9 (L) 3.5 - 5.0 g/dL   Magnesium    Collection Time: 04/07/22  4:39 AM   Result Value Ref Range    Magnesium 2.0 1.6 - 2.4 mg/dL   Protime-INR    Collection Time: 04/07/22  4:39 AM   Result Value Ref Range    Protime 17.4 (H) 11.9 - 14.6 sec    INR 1.4 (H) 0.9 - 1.1     Ammonia    Collection Time: 04/07/22  4:39 AM   Result Value Ref Range    Ammonia 19 <32 umol/L           Assessment and plan:     71 year old white male with history of multiple medical problems who came with increasing shortness of breath, generalized weakness and was found to have pancytopenia and severe anemia.     1) anemia: Patient came with severe anemia.  Patient has no obvious bleeding.  -His peripheral smear on 8 August shows microcytosis, anisocytosis, decrease in platelet numbers, frequent large platelets, decrease in white cells and slight increase in monocytes.  No blast cells were seen.  No schistocytes or spherocytes were seen..     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  His CT of abdomen shows splenomegaly and cirrhotic changes.  -Patient had colonoscopy which showed multiple polyps.  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  Multiple biopsies were taken from EGD as well as colonoscopy and results are pending.    -Patient's CBC today shows hemoglobin of 7.3 and platelet count of 88,000.    This dictation was done by dragon, Advertising account planner.  Often unanticipated grammatical, syntax, phones and other interpretive errors are inadvertently transcribed.  Please excuse errors that have escaped final proofreading.       Signed By: Grayland Jack, MD     April 07, 2022

## 2022-04-07 NOTE — Progress Notes (Addendum)
Comprehensive Nutrition Assessment    Type and Reason for Visit:  Initial, Positive Nutrition Screen (MST 5 for >/34# wt loss, w/ poor po d/t decreased appetite)    Nutrition Recommendations/Plan:   As medically appropriate, advance diet to goal GI bland   As medically appropriate add Ensure Enlive 2x/d     If diet not anticipated in 1-2 days, consider PN to meet nutr needs   Monitor and document intake %, supplement tolerance, bms, wts in I/Os thanks     Malnutrition Assessment:  Malnutrition Status:  Severe malnutrition (04/07/22 1301)    Context:  Chronic Illness     Findings of the 6 clinical characteristics of malnutrition:  Energy Intake:  Mild decrease in energy intake (Comment) (<50% of needs x2 weeks)  Weight Loss:  Greater than 7.5% over 3 months (15% x15mo's, 25% wt loss x4 mos, 35% <72mo's)     Body Fat Loss:  Severe body fat loss Buccal region, Orbital, Triceps   Muscle Mass Loss:  Severe muscle mass loss Clavicles (pectoralis & deltoids), Scapula (trapezius), Temples (temporalis), Hand (interosseous)  Fluid Accumulation:  No significant fluid accumulation    Grip Strength:  Not Performed    Nutrition Assessment:    Pt admitted w/ GI bleed. Significant wt loss noted per emar. 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  His CT of abdomen shows splenomegaly and cirrhotic changes." Pt planned for colonoscopy today. Significant wt loss noted per emar. Pt states has not eaten ~2wks d/t emesis after meals.  Consider PN if diet not anticipated within next 1-2 days.  Pt at risk for refeeding, K, Mg, Phos have been replenished. Rec to start diet as medically able, w/ ONS as above.  Labs: H/H 7.3/23.2, K wnl, Mg 2.0, Phos 2.6, Alb 1.9, Cr 0.57. Meds: amiodarone, FeSulf, PPI, neurontin, ranexa, crestor, vortioxetine.    Nutrition Related Findings:    NPFE shows moderate-servere findings. No n/v/c/d. BM 8/9, no s/s/ blood noted. No hx chew/swallow  issues. No edema. Wound Type: Skin Tears, Pressure Injury, Unstageable (Pt has sacral pressure injury with blanchable redness surrounding ulcer, right outer upper forearm skin tear, bruising all extremities. Scabs to RL lateral ankle.)       Current Nutrition Intake & Therapies:    Average Meal Intake: NPO  Average Supplements Intake: NPO  Diet NPO Exceptions are: Sips of Water with Meds    Anthropometric Measures:  Height: 180.3 cm (5\' 11" )  Ideal Body Weight (IBW): 172 lbs (78 kg)      Current Body Weight: 152 lb 1.9 oz (69 kg), 88.4 % IBW. Weight Source: Bed Scale  Current BMI (kg/m2): 21.2  Usual Body Weight: 176 lb (79.8 kg) (6/1; pt reports 240# this yer)  % Weight Change (Calculated): -13.6  Weight Adjustment For: No Adjustment   BMI Categories: Underweight (BMI less than 22) age over 37    Estimated Daily Nutrient Needs:  Energy Requirements Based On: Kcal/kg  Weight Used for Energy Requirements: Current  Energy (kcal/day): 76 kcal/d (30-35 kcal/kg)  Weight Used for Protein Requirements: Current  Protein (g/day): 81-101 g/d (1.2-1.5 g/kg)  Method Used for Fluid Requirements: 1 ml/kcal  Fluid (ml/day): 2025    Nutrition Diagnosis:   Severe malnutrition, In context of chronic illness related to inadequate protein-energy intake, altered GI function (GI bleed?) as evidenced by Criteria as identified in malnutrition assessment    Nutrition Interventions:   Food and/or Nutrient Delivery: Start  Oral Diet  Nutrition Education/Counseling: No recommendation at this time  Coordination of Nutrition Care: Continue to monitor while inpatient  Plan of Care discussed with: Pt    Goals:     Goals: Meet at least 75% of estimated needs, within 2 days, by next RD assessment       Nutrition Monitoring and Evaluation:   Behavioral-Environmental Outcomes: None Identified  Food/Nutrient Intake Outcomes: Diet Advancement/Tolerance  Physical Signs/Symptoms Outcomes: GI Status, Biochemical Data, Weight, Skin    Discharge  Planning:    Too soon to determine     Lucy Call, RD  Contact: Ext [phone] or via PerfectServe

## 2022-04-07 NOTE — Progress Notes (Signed)
Hospitalist Progress Note    NAME:   Carlos Petersen   DOB: Sep 30, 1950   MRN: [id number]     Subjective:   Daily Progress Note: 04/07/2022     Hospital course to date/HPI from H&P:  Carlos Petersen is a 71 y.o.  male with PMHx significant multiple comorbidities presents [facility] with complaints of weakness/dizziness.  Patient states over the past few days has been having gradual onset persistent progressive generalized weakness as well as dizziness resulting in a limitation in ambulation following which patient presented to the ED.  Of note patient is on blood thinners for blood clots.  Patient denies any fevers chills nausea vomiting orthopnea paroxysmal nocturnal dyspnea chest pain palpitations headache focal weakness loss sensation auditory or visual symptoms abdominal or urinary complaints or any other associated symptoms.  Patient endorses no recent sick contacts or travel activity  We were asked to admit for work up and evaluation of the above problems.     Chief complaint: Weakness and dizziness  04/06/2022-patient seen and examined, chart was reviewed.  Patient denied any pain, nausea, vomiting.  No other acute issues reported to me by staff at this time.    04/07/22-patient seen, chart was reviewed.  Patient for EGD/colonoscopy today per GI notes.  No bleeding issues reported by patient.  No other acute issues reported to me by staff.  BP soft but asymptomatic.  Therapy recommending SNF.    Current Facility-Administered Medications   Medication Dose Route Frequency    midodrine (PROAMATINE) tablet 5 mg  5 mg Oral TID WC    midodrine (PROAMATINE) tablet 5 mg  5 mg Oral Q6H PRN    dextrose 5 % and 0.45 % NaCl with KCl 20 mEq infusion   IntraVENous Continuous    0.9 % sodium chloride infusion   IntraVENous PRN    0.9 % sodium chloride infusion   IntraVENous PRN    pantoprazole (PROTONIX) injection 40 mg  40 mg IntraVENous BID    albuterol sulfate HFA (PROVENTIL;VENTOLIN;PROAIR) 108 (90  Base) MCG/ACT inhaler 2 puff  2 puff Inhalation Q6H PRN    amiodarone (CORDARONE) tablet 200 mg  200 mg Oral Daily    carbidopa-levodopa (SINEMET CR) 25-100 MG per extended release tablet 4 tablet  4 tablet Oral TID    ferrous sulfate (IRON 325) tablet 325 mg  325 mg Oral Daily with breakfast    gabapentin (NEURONTIN) capsule 100 mg  100 mg Oral BID    ranolazine (RANEXA) extended release tablet 500 mg  500 mg Oral BID    rosuvastatin (CRESTOR) tablet 20 mg  20 mg Oral Nightly    VORTIoxetine HBr (TRINTELLIX) tablet 20 mg  20 mg Oral Daily    sodium chloride flush 0.9 % injection 5-40 mL  5-40 mL IntraVENous 2 times per day    sodium chloride flush 0.9 % injection 5-40 mL  5-40 mL IntraVENous PRN    0.9 % sodium chloride infusion   IntraVENous PRN    ondansetron (ZOFRAN-ODT) disintegrating tablet 4 mg  4 mg Oral Q8H PRN    Or    ondansetron (ZOFRAN) injection 4 mg  4 mg IntraVENous Q6H PRN    polyethylene glycol (GLYCOLAX) packet 17 g  17 g Oral Daily PRN    acetaminophen (TYLENOL) tablet 650 mg  650 mg Oral Q6H PRN    Or    acetaminophen (TYLENOL) suppository 650 mg  650 mg Rectal Q6H PRN    budesonide-formoterol (SYMBICORT) 160-4.5  MCG/ACT inhaler 2 puff  2 puff Inhalation BID RT    And    tiotropium (SPIRIVA RESPIMAT) 2.5 MCG/ACT inhaler 2 puff  2 puff Inhalation Daily RT          Objective:     BP (!) 102/46   Pulse 83   Temp 97.8 F (36.6 C) (Oral)   Resp 20   Ht 1.803 m (5\' 11" )   Wt 67.5 kg (148 lb 13 oz)   SpO2 100%   BMI 20.75 kg/m         Temp (24hrs), Avg:98.3 F (36.8 C), Min:97.3 F (36.3 C), Max:99 F (37.2 C)        PHYSICAL EXAM:  Gen thin built  Neck Supple  CVS RRR  Resp Symmetric expansion  Abdomen soft, NT/ND  Ext moves all  Neuro Alert slow speech  Psych flat affect  Skin no visible Rash        Data Review:    Recent Labs     04/06/22  0255 04/07/22  0439   NA 133* 136   K 3.8 3.6   CL 104 107   CO2 23 24   GLUCOSE 90 97   BUN 15 14   CREATININE 0.57* 0.57*   CALCIUM 7.4* 7.3*   MG   --  2.0   PHOS  --  2.6   LABALBU 1.8*  1.8* 1.9*   BILITOT 1.0  1.0  --    AST 19  19  --    ALT <6*  <6*  --        Recent Results (from the past 18 hour(s))   CBC with Auto Differential    Collection Time: 04/07/22  4:39 AM   Result Value Ref Range    WBC 3.2 (L) 4.1 - 11.1 K/uL    RBC 2.96 (L) 4.10 - 5.70 M/uL    Hemoglobin 7.3 (L) 12.1 - 17.0 g/dL    Hematocrit 06/07/22 (L) 36.6 - 50.3 %    MCV 78.4 (L) 80.0 - 99.0 FL    MCH 24.7 (L) 26.0 - 34.0 PG    MCHC 31.5 30.0 - 36.5 g/dL    RDW 98.1 (H) 19.1 - 14.5 %    Platelets 88 (L) 150 - 400 K/uL    MPV 9.9 8.9 - 12.9 FL    Nucleated RBCs 0.0 0.0 PER 100 WBC    nRBC 0.00 0.00 - 0.01 K/uL    Neutrophils % 57 32 - 75 %    Lymphocytes % 26 12 - 49 %    Monocytes % 15 (H) 5 - 13 %    Eosinophils % 0 0 - 7 %    Basophils % 0 0 - 1 %    Immature Granulocytes 2 (H) 0 - 0.5 %    Neutrophils Absolute 1.8 1.8 - 8.0 K/UL    Lymphocytes Absolute 0.8 0.8 - 3.5 K/UL    Monocytes Absolute 0.5 0.0 - 1.0 K/UL    Eosinophils Absolute 0.0 0.0 - 0.4 K/UL    Basophils Absolute 0.0 0.0 - 0.1 K/UL    Absolute Immature Granulocyte 0.1 (H) 0.00 - 0.04 K/UL    Differential Type AUTOMATED     Renal Function Panel    Collection Time: 04/07/22  4:39 AM   Result Value Ref Range    Sodium 136 136 - 145 mmol/L    Potassium 3.6 3.5 - 5.1 mmol/L    Chloride 107 97 - 108 mmol/L  CO2 24 21 - 32 mmol/L    Anion Gap 5 5 - 15 mmol/L    Glucose 97 65 - 100 mg/dL    BUN 14 6 - 20 mg/dL    Creatinine 9.62 (L) 0.70 - 1.30 mg/dL    Bun/Cre Ratio 25 (H) 12 - 20      Est, Glom Filt Rate >60 >60 ml/min/1.38m2    Calcium 7.3 (L) 8.5 - 10.1 mg/dL    Phosphorus 2.6 2.6 - 4.7 mg/dL    Albumin 1.9 (L) 3.5 - 5.0 g/dL   Magnesium    Collection Time: 04/07/22  4:39 AM   Result Value Ref Range    Magnesium 2.0 1.6 - 2.4 mg/dL   Protime-INR    Collection Time: 04/07/22  4:39 AM   Result Value Ref Range    Protime 17.4 (H) 11.9 - 14.6 sec    INR 1.4 (H) 0.9 - 1.1     Ammonia    Collection Time: 04/07/22  4:39 AM   Result  Value Ref Range    Ammonia 19 <32 umol/L     No results found for this or any previous visit.     Results       ** No results found for the last 336 hours. **             CT ABDOMEN PELVIS W IV CONTRAST Result Date: 04/05/2022    Cirrhotic change with splenomegaly. Postcholecystectomy. Incidental and/or nonemergent findings are as described above.           Assessment/Plan:     Suspected GI bleed with acute blood loss anemia-  S/p PRBC  Monitor H&H  IV PPI  GI on case  Egd/colo 8/9-  Follow GI recommendations  Transfuse if below 7    Cirrhotic liver with splenomegaly-  Monitor INR and ammonia periodically  LFTs noted  GI on case follow recommendations  Hematology on case  for splenomegaly  Defer work-up to GI team      Electrolyte imbalances-monitor and replete  HLD-on statins  Previous DVT-holding anticoagulation, hematology evaluation  Mild pancytopenia-from splenomegaly and COL,monitor CBC, hematology  on case  Chronic A-fib-continue Amio, holding anticoagulation  ?Parkinson's disease-continue Sinemet  Hypotension-start midodrine and monitor BP    Discussion/MDM:- Discussion/MDM: Patient with multiple medical comorbidities, each with high likelihood for morbidity and mortality if left untreated.   I have reviewed patient's presenting subjective and objective findings, as well as all laboratory studies, imaging studies, and vital signs to date as well as treatment rendered and patient's response to those treatments. Pt need IV fluids with additives and Drug therapy requiring intensive monitoring for toxicity. In addition, prior medical, surgical and relevant social and family histories were reviewed. I have discussed management plan with patient/family and with nursing staff.        AD FC  DVT Prx -SCD  NOK -he did not specify  Social Determinants of Health-unknown    Disposition-48 H,  SNF    Barriers to discharge-IV Protonix, medical stability, consultant clearance        This is dictation was done by dragon,  computer voice recognition software.  Quite often unanticipated grammatical, syntax, homophones and other interpretive errors or inadvertently transcribed by the computer software.  Please excuse errors that have escaped final proofreading. Please contact me for any questions or details.  Thank you.     Signed By: Valetta Close, MD     April 07, 2022

## 2022-04-08 LAB — RENAL FUNCTION PANEL
Albumin: 1.8 g/dL — ABNORMAL LOW (ref 3.5–5.0)
Anion Gap: 6 mmol/L (ref 5–15)
BUN: 8 mg/dL (ref 6–20)
Bun/Cre Ratio: 15 (ref 12–20)
CO2: 24 mmol/L (ref 21–32)
Calcium: 7.1 mg/dL — ABNORMAL LOW (ref 8.5–10.1)
Chloride: 106 mmol/L (ref 97–108)
Creatinine: 0.52 mg/dL — ABNORMAL LOW (ref 0.70–1.30)
Est, Glom Filt Rate: >60 mL/min/{1.73_m2} (ref >60)
Glucose: 95 mg/dL (ref 65–100)
Phosphorus: 2.1 mg/dL — ABNORMAL LOW (ref 2.6–4.7)
Potassium: 3.4 mmol/L — ABNORMAL LOW (ref 3.5–5.1)
Sodium: 136 mmol/L (ref 136–145)

## 2022-04-08 LAB — HEMOGLOBIN AND HEMATOCRIT
Hematocrit: 24.5 % — ABNORMAL LOW (ref 36.6–50.3)
Hematocrit: 22.1 % — ABNORMAL LOW (ref 36.6–50.3)
Hemoglobin: 7.4 g/dL — ABNORMAL LOW (ref 12.1–17.0)
Hemoglobin: 6.9 g/dL — ABNORMAL LOW (ref 12.1–17.0)

## 2022-04-08 LAB — CBC WITH AUTO DIFFERENTIAL
Absolute Immature Granulocyte: 0 10*3/uL (ref 0.00–0.04)
Basophils %: 0 % (ref 0–1)
Basophils Absolute: 0 10*3/uL (ref 0.0–0.1)
Eosinophils %: 0 % (ref 0–7)
Eosinophils Absolute: 0 10*3/uL (ref 0.0–0.4)
Hematocrit: 23.9 % — ABNORMAL LOW (ref 36.6–50.3)
Hemoglobin: 7.4 g/dL — ABNORMAL LOW (ref 12.1–17.0)
Immature Granulocytes: 1 % — ABNORMAL HIGH (ref 0–0.5)
Lymphocytes %: 18 % (ref 12–49)
Lymphocytes Absolute: 0.6 10*3/uL — ABNORMAL LOW (ref 0.8–3.5)
MCH: 24.7 PG — ABNORMAL LOW (ref 26.0–34.0)
MCHC: 31 g/dL (ref 30.0–36.5)
MCV: 79.7 FL — ABNORMAL LOW (ref 80.0–99.0)
MPV: 9.9 FL (ref 8.9–12.9)
Monocytes %: 12 % (ref 5–13)
Monocytes Absolute: 0.4 10*3/uL (ref 0.0–1.0)
Neutrophils %: 69 % (ref 32–75)
Neutrophils Absolute: 2.3 10*3/uL (ref 1.8–8.0)
Nucleated RBCs: 0 PER 100 WBC
Platelets: 79 10*3/uL — ABNORMAL LOW (ref 150–400)
RBC: 3 M/uL — ABNORMAL LOW (ref 4.10–5.70)
RDW: 18.7 % — ABNORMAL HIGH (ref 11.5–14.5)
WBC: 3.4 10*3/uL — ABNORMAL LOW (ref 4.1–11.1)
nRBC: 0 10*3/uL (ref 0.00–0.01)

## 2022-04-08 LAB — MAGNESIUM: Magnesium: 1.8 mg/dL (ref 1.6–2.4)

## 2022-04-08 MED ORDER — K PHOS MONO-SOD PHOS DI & MONO 155-852-130 MG PO TABS
155-852-130 | Freq: Three times a day (TID) | ORAL | Status: DC
Start: 2022-04-08 — End: 2022-04-09
  Administered 2022-04-08 – 2022-04-09 (×2): 1 mg via ORAL

## 2022-04-08 MED ORDER — POTASSIUM PHOSPHATE 3 MMOL/ML IV SOLN (MIXTURES ONLY)
15 mmol/5 mL | Freq: Once | INTRAVENOUS | Status: AC
Start: 2022-04-08 — End: 2022-04-08
  Administered 2022-04-08: 18:00:00 20 mmol via INTRAVENOUS

## 2022-04-08 MED ORDER — SODIUM CHLORIDE 0.9 % IV SOLN
0.9 | INTRAVENOUS | Status: DC | PRN
Start: 2022-04-08 — End: 2022-04-12

## 2022-04-08 MED FILL — POTASSIUM PHOSPHATES 45 MMOLE/15ML IV SOLN: 45 MMOLE/15ML | INTRAVENOUS | Qty: 6.67

## 2022-04-08 NOTE — Progress Notes (Signed)
Patient's blood transfusion is now complete. No adverse effect noted of reported.

## 2022-04-08 NOTE — Progress Notes (Signed)
Hematology and Oncology Progress Note    Patient: Carlos Petersen MRN: [id number]  SSN: [id number]    Date of Birth: [birth year]  Age: 71 y.o.  Sex: male      Admit Date: 04/05/2022    LOS: 3 days     Chief Complaint: Patient is feeling tired    Subjective:     Patient had endoscopies on 9 August.  He is feeling tired and weak.  He has no nausea or vomiting.  No obvious bleeding.    Objective:     Vitals:    04/08/22 0145 04/08/22 0442 04/08/22 0550 04/08/22 0744   BP: (!) 94/53 (!) 101/50  (!) 105/53   Pulse: 77 78  78   Resp: 16 18  17    Temp: 98.2 F (36.8 C) 98.4 F (36.9 C)  99 F (37.2 C)   TempSrc: Oral Oral  Oral   SpO2: 100% 99%  100%   Weight:   67 kg (147 lb 11.3 oz)    Height:            Physical Exam:  Constitutional: Elderly white male looks chronically ill.  Not in any acute distress or pain.  Eyes: Sclerae anicteric. Conjunctivae shows pallor.  Patient is bitemporal wasting.  ENMT: Oral mucosa is moist.  Neck: No adenopathy.   Respiratory: Lungs are clear bilaterally.  Cardiovascular: Normal sinus rhythm.  Patient has defibrillator on left side chest wall.  Abdomen: Soft, nontender, no hepatosplenomegaly. No guarding or rigidity. Bowel sounds present.  Extremities: No edema.  Skin: No petechiae; no skin rash.  Neurologic: Alert/oriented x 3    Lab/Data Review:    Recent Results (from the past 24 hour(s))   CBC with Auto Differential    Collection Time: 04/08/22  6:38 AM   Result Value Ref Range    WBC 3.4 (L) 4.1 - 11.1 K/uL    RBC 3.00 (L) 4.10 - 5.70 M/uL    Hemoglobin 7.4 (L) 12.1 - 17.0 g/dL    Hematocrit 06/08/22 (L) 36.6 - 50.3 %    MCV 79.7 (L) 80.0 - 99.0 FL    MCH 24.7 (L) 26.0 - 34.0 PG    MCHC 31.0 30.0 - 36.5 g/dL    RDW 06.3 (H) 01.6 - 14.5 %    Platelets 79 (L) 150 - 400 K/uL    MPV 9.9 8.9 - 12.9 FL    Nucleated RBCs 0.0 0.0 PER 100 WBC    nRBC 0.00 0.00 - 0.01 K/uL    Neutrophils % 69 32 - 75 %    Lymphocytes % 18 12 - 49 %    Monocytes % 12 5 - 13 %    Eosinophils % 0 0 - 7 %     Basophils % 0 0 - 1 %    Immature Granulocytes 1 (H) 0 - 0.5 %    Neutrophils Absolute 2.3 1.8 - 8.0 K/UL    Lymphocytes Absolute 0.6 (L) 0.8 - 3.5 K/UL    Monocytes Absolute 0.4 0.0 - 1.0 K/UL    Eosinophils Absolute 0.0 0.0 - 0.4 K/UL    Basophils Absolute 0.0 0.0 - 0.1 K/UL    Absolute Immature Granulocyte 0.0 0.00 - 0.04 K/UL    Differential Type AUTOMATED     Renal Function Panel    Collection Time: 04/08/22  6:38 AM   Result Value Ref Range    Sodium 136 136 - 145 mmol/L    Potassium 3.4 (L) 3.5 - 5.1  mmol/L    Chloride 106 97 - 108 mmol/L    CO2 24 21 - 32 mmol/L    Anion Gap 6 5 - 15 mmol/L    Glucose 95 65 - 100 mg/dL    BUN 8 6 - 20 mg/dL    Creatinine 7.06 (L) 0.70 - 1.30 mg/dL    Bun/Cre Ratio 15 12 - 20      Est, Glom Filt Rate >60 >60 ml/min/1.90m2    Calcium 7.1 (L) 8.5 - 10.1 mg/dL    Phosphorus 2.1 (L) 2.6 - 4.7 mg/dL    Albumin 1.8 (L) 3.5 - 5.0 g/dL   Magnesium    Collection Time: 04/08/22  6:38 AM   Result Value Ref Range    Magnesium 1.8 1.6 - 2.4 mg/dL   Hemoglobin and Hematocrit    Collection Time: 04/08/22  6:40 AM   Result Value Ref Range    Hemoglobin 7.4 (L) 12.1 - 17.0 g/dL    Hematocrit 23.7 (L) 36.6 - 50.3 %           Assessment and plan:     71 year old white male with history of multiple medical problems who came with increasing shortness of breath, generalized weakness and was found to have pancytopenia and severe anemia.     1) anemia: Patient came with severe anemia.  Patient has no obvious bleeding.  -His peripheral smear on 8 August shows microcytosis, anisocytosis, decrease in platelet numbers, frequent large platelets, decrease in white cells and slight increase in monocytes.  No blast cells were seen.  No schistocytes or spherocytes were seen..     -Based on this clinical picture and peripheral smear it looks like patient may have pancytopenia from possible hypersplenism from cirrhosis of the liver.  His CT of abdomen shows splenomegaly and cirrhotic changes.  -Patient had  colonoscopy which showed multiple polyps.  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  Multiple biopsies were taken from EGD as well as colonoscopy and results are pending.    -Patient's hemoglobin is 7.4 today and platelet counts are 79,000.  No need for transfusion today.  Will continue to monitor his H&H.    This dictation was done by dragon, Advertising account planner.  Often unanticipated grammatical, syntax, phones and other interpretive errors are inadvertently transcribed.  Please excuse errors that have escaped final proofreading.       Signed By: Grayland Jack, MD     April 08, 2022

## 2022-04-08 NOTE — Plan of Care (Signed)
Problem: Discharge Planning  Goal: Discharge to home or other facility with appropriate resources  04/08/2022 2320 by Buzzy Han, RN  Outcome: Progressing  04/08/2022 1121 by Wilhemena Durie, RN  Outcome: Progressing     Problem: Skin/Tissue Integrity  Goal: Absence of new skin breakdown  Description: 1.  Monitor for areas of redness and/or skin breakdown  2.  Assess vascular access sites hourly  3.  Every 4-6 hours minimum:  Change oxygen saturation probe site  4.  Every 4-6 hours:  If on nasal continuous positive airway pressure, respiratory therapy assess nares and determine need for appliance change or resting period.  04/08/2022 2320 by Buzzy Han, RN  Outcome: Progressing  04/08/2022 1121 by Wilhemena Durie, RN  Outcome: Progressing     Problem: ABCDS Injury Assessment  Goal: Absence of physical injury  04/08/2022 2320 by Buzzy Han, RN  Outcome: Progressing  04/08/2022 1121 by Wilhemena Durie, RN  Outcome: Progressing     Problem: Safety - Adult  Goal: Free from fall injury  04/08/2022 2320 by Buzzy Han, RN  Outcome: Progressing  04/08/2022 1121 by Wilhemena Durie, RN  Outcome: Progressing     Problem: Chronic Conditions and Co-morbidities  Goal: Patient's chronic conditions and co-morbidity symptoms are monitored and maintained or improved  04/08/2022 2320 by Buzzy Han, RN  Outcome: Progressing  04/08/2022 1121 by Wilhemena Durie, RN  Outcome: Progressing

## 2022-04-08 NOTE — Plan of Care (Signed)
Problem: Discharge Planning  Goal: Discharge to home or other facility with appropriate resources  04/08/2022 1121 by Wilhemena Durie, RN  Outcome: Progressing  04/07/2022 2347 by Buzzy Han, RN  Outcome: Progressing     Problem: Skin/Tissue Integrity  Goal: Absence of new skin breakdown  Description: 1.  Monitor for areas of redness and/or skin breakdown  2.  Assess vascular access sites hourly  3.  Every 4-6 hours minimum:  Change oxygen saturation probe site  4.  Every 4-6 hours:  If on nasal continuous positive airway pressure, respiratory therapy assess nares and determine need for appliance change or resting period.  04/08/2022 1121 by Wilhemena Durie, RN  Outcome: Progressing  04/07/2022 2347 by Buzzy Han, RN  Outcome: Progressing     Problem: ABCDS Injury Assessment  Goal: Absence of physical injury  04/08/2022 1121 by Wilhemena Durie, RN  Outcome: Progressing  04/07/2022 2347 by Buzzy Han, RN  Outcome: Progressing     Problem: Safety - Adult  Goal: Free from fall injury  04/08/2022 1121 by Wilhemena Durie, RN  Outcome: Progressing  04/07/2022 2347 by Buzzy Han, RN  Outcome: Progressing     Problem: Chronic Conditions and Co-morbidities  Goal: Patient's chronic conditions and co-morbidity symptoms are monitored and maintained or improved  04/08/2022 1121 by Wilhemena Durie, RN  Outcome: Progressing  04/07/2022 2347 by Buzzy Han, RN  Outcome: Progressing

## 2022-04-08 NOTE — Progress Notes (Signed)
Hospitalist Progress Note    NAME:   Carlos Petersen   DOB: 1951/01/14   MRN: [id number]     Subjective:   Daily Progress Note: 04/08/2022     Hospital course to date/HPI from H&P:  Carlos Petersen is a 71 y.o.  male with PMHx significant multiple comorbidities presents [facility] with complaints of weakness/dizziness.  Patient states over the past few days has been having gradual onset persistent progressive generalized weakness as well as dizziness resulting in a limitation in ambulation following which patient presented to the ED.  Of note patient is on blood thinners for blood clots.  Patient denies any fevers chills nausea vomiting orthopnea paroxysmal nocturnal dyspnea chest pain palpitations headache focal weakness loss sensation auditory or visual symptoms abdominal or urinary complaints or any other associated symptoms.  Patient endorses no recent sick contacts or travel activity  We were asked to admit for work up and evaluation of the above problems.     Chief complaint: Weakness and dizziness  04/06/2022-patient seen and examined, chart was reviewed.  Patient denied any pain, nausea, vomiting.  No other acute issues reported to me by staff at this time.    04/07/22-patient seen, chart was reviewed.  Patient for EGD/colonoscopy today per GI notes.  No bleeding issues reported by patient.  No other acute issues reported to me by staff.  BP soft but asymptomatic.  Therapy recommending SNF.    04/08/22-patient seen and examined, chart was reviewed.  Patient had EGD yesterday for colonoscopy today.  No bleeding issues reported to me by staff.    Current Facility-Administered Medications   Medication Dose Route Frequency    midodrine (PROAMATINE) tablet 5 mg  5 mg Oral TID WC    midodrine (PROAMATINE) tablet 5 mg  5 mg Oral Q6H PRN    dextrose 5 % and 0.45 % NaCl with KCl 20 mEq infusion   IntraVENous Continuous    0.9 % sodium chloride infusion   IntraVENous PRN    0.9 % sodium chloride  infusion   IntraVENous PRN    albuterol sulfate HFA (PROVENTIL;VENTOLIN;PROAIR) 108 (90 Base) MCG/ACT inhaler 2 puff  2 puff Inhalation Q6H PRN    amiodarone (CORDARONE) tablet 200 mg  200 mg Oral Daily    carbidopa-levodopa (SINEMET CR) 25-100 MG per extended release tablet 4 tablet  4 tablet Oral TID    ferrous sulfate (IRON 325) tablet 325 mg  325 mg Oral Daily with breakfast    gabapentin (NEURONTIN) capsule 100 mg  100 mg Oral BID    ranolazine (RANEXA) extended release tablet 500 mg  500 mg Oral BID    rosuvastatin (CRESTOR) tablet 20 mg  20 mg Oral Nightly    VORTIoxetine HBr (TRINTELLIX) tablet 20 mg  20 mg Oral Daily    sodium chloride flush 0.9 % injection 5-40 mL  5-40 mL IntraVENous 2 times per day    sodium chloride flush 0.9 % injection 5-40 mL  5-40 mL IntraVENous PRN    0.9 % sodium chloride infusion   IntraVENous PRN    ondansetron (ZOFRAN-ODT) disintegrating tablet 4 mg  4 mg Oral Q8H PRN    Or    ondansetron (ZOFRAN) injection 4 mg  4 mg IntraVENous Q6H PRN    polyethylene glycol (GLYCOLAX) packet 17 g  17 g Oral Daily PRN    acetaminophen (TYLENOL) tablet 650 mg  650 mg Oral Q6H PRN    Or    acetaminophen (TYLENOL) suppository  650 mg  650 mg Rectal Q6H PRN    budesonide-formoterol (SYMBICORT) 160-4.5 MCG/ACT inhaler 2 puff  2 puff Inhalation BID RT    And    tiotropium (SPIRIVA RESPIMAT) 2.5 MCG/ACT inhaler 2 puff  2 puff Inhalation Daily RT          Objective:     BP (!) 105/53   Pulse 78   Temp 99 F (37.2 C) (Oral)   Resp 16   Ht 1.803 m (5\' 11" )   Wt 67 kg (147 lb 11.3 oz)   SpO2 98%   BMI 20.60 kg/m         Temp (24hrs), Avg:98.4 F (36.9 C), Min:97.7 F (36.5 C), Max:99 F (37.2 C)        PHYSICAL EXAM:  Gen thin built  Neck Supple  CVS RRR  Resp Symmetric expansion  Abdomen soft, NT/ND  Ext moves all  Neuro Alert slow speech  Psych flat affect  Skin no visible Rash        Data Review:    Recent Labs     04/06/22  0255 04/07/22  0439 04/08/22  0638   NA 133*   < > 136   K 3.8    < > 3.4*   CL 104   < > 106   CO2 23   < > 24   GLUCOSE 90   < > 95   BUN 15   < > 8   CREATININE 0.57*   < > 0.52*   CALCIUM 7.4*   < > 7.1*   MG  --    < > 1.8   PHOS  --    < > 2.1*   LABALBU 1.8*  1.8*   < > 1.8*   BILITOT 1.0  1.0  --   --    AST 19  19  --   --    ALT <6*  <6*  --   --     < > = values in this interval not displayed.         Recent Results (from the past 18 hour(s))   CBC with Auto Differential    Collection Time: 04/08/22  6:38 AM   Result Value Ref Range    WBC 3.4 (L) 4.1 - 11.1 K/uL    RBC 3.00 (L) 4.10 - 5.70 M/uL    Hemoglobin 7.4 (L) 12.1 - 17.0 g/dL    Hematocrit 06/08/22 (L) 36.6 - 50.3 %    MCV 79.7 (L) 80.0 - 99.0 FL    MCH 24.7 (L) 26.0 - 34.0 PG    MCHC 31.0 30.0 - 36.5 g/dL    RDW 21.3 (H) 08.6 - 14.5 %    Platelets 79 (L) 150 - 400 K/uL    MPV 9.9 8.9 - 12.9 FL    Nucleated RBCs 0.0 0.0 PER 100 WBC    nRBC 0.00 0.00 - 0.01 K/uL    Neutrophils % 69 32 - 75 %    Lymphocytes % 18 12 - 49 %    Monocytes % 12 5 - 13 %    Eosinophils % 0 0 - 7 %    Basophils % 0 0 - 1 %    Immature Granulocytes 1 (H) 0 - 0.5 %    Neutrophils Absolute 2.3 1.8 - 8.0 K/UL    Lymphocytes Absolute 0.6 (L) 0.8 - 3.5 K/UL    Monocytes Absolute 0.4 0.0 - 1.0 K/UL    Eosinophils  Absolute 0.0 0.0 - 0.4 K/UL    Basophils Absolute 0.0 0.0 - 0.1 K/UL    Absolute Immature Granulocyte 0.0 0.00 - 0.04 K/UL    Differential Type AUTOMATED     Renal Function Panel    Collection Time: 04/08/22  6:38 AM   Result Value Ref Range    Sodium 136 136 - 145 mmol/L    Potassium 3.4 (L) 3.5 - 5.1 mmol/L    Chloride 106 97 - 108 mmol/L    CO2 24 21 - 32 mmol/L    Anion Gap 6 5 - 15 mmol/L    Glucose 95 65 - 100 mg/dL    BUN 8 6 - 20 mg/dL    Creatinine 9.81 (L) 0.70 - 1.30 mg/dL    Bun/Cre Ratio 15 12 - 20      Est, Glom Filt Rate >60 >60 ml/min/1.77m2    Calcium 7.1 (L) 8.5 - 10.1 mg/dL    Phosphorus 2.1 (L) 2.6 - 4.7 mg/dL    Albumin 1.8 (L) 3.5 - 5.0 g/dL   Magnesium    Collection Time: 04/08/22  6:38 AM   Result Value Ref  Range    Magnesium 1.8 1.6 - 2.4 mg/dL   Hemoglobin and Hematocrit    Collection Time: 04/08/22  6:40 AM   Result Value Ref Range    Hemoglobin 7.4 (L) 12.1 - 17.0 g/dL    Hematocrit 19.1 (L) 36.6 - 50.3 %     No results found for this or any previous visit.     Results       ** No results found for the last 336 hours. **             CT ABDOMEN PELVIS W IV CONTRAST Result Date: 04/05/2022    Cirrhotic change with splenomegaly. Postcholecystectomy. Incidental and/or nonemergent findings are as described above.       EGD- Impression:  Abnormal esophageal motility.  Portal hypertensive gastropathy.  Normal examined duodenum.  No specimens collected.  Recommendation:  Continue present medications.      Assessment/Plan:     Suspected GI bleed with acute blood loss anemia-  S/p PRBC  Monitor H&H  IV PPI  Egd 8/9- as above  Colo 8/10-today  Follow GI recommendations  Transfuse if below 7    Cirrhotic liver with splenomegaly-  Monitor INR and ammonia periodically  LFTs noted  GI on case follow recommendations  Hematology on case  for splenomegaly  Defer work-up to GI team      Electrolyte imbalances-monitor and replete k/phos  HLD-on statins  Previous DVT-holding anticoagulation, hematology evaluation    Mild pancytopenia-from splenomegaly and COL,monitor CBC, hematology  on case    Chronic A-fib-continue Amio, holding anticoagulation  ?Parkinson's disease-continue Sinemet  Hypotension-start midodrine and monitor BP stable    Discussion/MDM:- Discussion/MDM: Patient with multiple medical comorbidities, each with high likelihood for morbidity and mortality if left untreated.   I have reviewed patient's presenting subjective and objective findings, as well as all laboratory studies, imaging studies, and vital signs to date as well as treatment rendered and patient's response to those treatments. Pt need IV fluids with additives and Drug therapy requiring intensive monitoring for toxicity. In addition, prior medical, surgical  and relevant social and family histories were reviewed. I have discussed management plan with patient/family and with nursing staff.        AD FC  DVT Prx -SCD  NOK -he did not specify  Social Determinants of Health-unknown  Disposition-48 H,  SNF    Barriers to discharge-IV Protonix, medical stability, colo, consultant clearance        This is dictation was done by dragon, computer voice recognition software.  Quite often unanticipated grammatical, syntax, homophones and other interpretive errors or inadvertently transcribed by the computer software.  Please excuse errors that have escaped final proofreading. Please contact me for any questions or details.  Thank you.     Signed By: Valetta Close, MD     April 08, 2022

## 2022-04-08 NOTE — Progress Notes (Signed)
Yesterday, poor prep, to days prep still shows dark stool  Polyp removed,    Vital signs stable  Hemoglobin stable    Plan  Keep on clear liquid diet    Continue bowel prep today keep going,  Colonoscopy postponed to tomorrow

## 2022-04-08 NOTE — Progress Notes (Signed)
Patient's colonoscopy rescheduled for tomorrow, order given to change diet to clear liquids and resume NPO status @ midnight for procedure tomorrow. Patient and family made aware.

## 2022-04-08 NOTE — Care Coordination-Inpatient (Signed)
Patient has been approved for SNF at [facility] once medically stable for discharge. Auth expires 04/12/2022.

## 2022-04-09 LAB — CBC WITH AUTO DIFFERENTIAL
Absolute Immature Granulocyte: 0 10*3/uL (ref 0.00–0.04)
Basophils %: 0 % (ref 0–1)
Basophils Absolute: 0 10*3/uL (ref 0.0–0.1)
Eosinophils %: 1 % (ref 0–7)
Eosinophils Absolute: 0 10*3/uL (ref 0.0–0.4)
Hematocrit: 23.5 % — ABNORMAL LOW (ref 36.6–50.3)
Hemoglobin: 7.6 g/dL — ABNORMAL LOW (ref 12.1–17.0)
Immature Granulocytes: 1 % — ABNORMAL HIGH (ref 0–0.5)
Lymphocytes %: 22 % (ref 12–49)
Lymphocytes Absolute: 0.8 10*3/uL (ref 0.8–3.5)
MCH: 25.4 PG — ABNORMAL LOW (ref 26.0–34.0)
MCHC: 32.3 g/dL (ref 30.0–36.5)
MCV: 78.6 FL — ABNORMAL LOW (ref 80.0–99.0)
MPV: 9.7 FL (ref 8.9–12.9)
Monocytes %: 12 % (ref 5–13)
Monocytes Absolute: 0.4 10*3/uL (ref 0.0–1.0)
Neutrophils %: 64 % (ref 32–75)
Neutrophils Absolute: 2.5 10*3/uL (ref 1.8–8.0)
Nucleated RBCs: 0 PER 100 WBC
Platelets: 76 10*3/uL — ABNORMAL LOW (ref 150–400)
RBC: 2.99 M/uL — ABNORMAL LOW (ref 4.10–5.70)
RDW: 18.9 % — ABNORMAL HIGH (ref 11.5–14.5)
WBC: 3.7 10*3/uL — ABNORMAL LOW (ref 4.1–11.1)
nRBC: 0 10*3/uL (ref 0.00–0.01)

## 2022-04-09 LAB — RENAL FUNCTION PANEL
Albumin: 1.6 g/dL — ABNORMAL LOW (ref 3.5–5.0)
Anion Gap: 8 mmol/L (ref 5–15)
BUN: 7 mg/dL (ref 6–20)
Bun/Cre Ratio: 15 (ref 12–20)
CO2: 24 mmol/L (ref 21–32)
Calcium: 6.7 mg/dL — ABNORMAL LOW (ref 8.5–10.1)
Chloride: 104 mmol/L (ref 97–108)
Creatinine: 0.46 mg/dL — ABNORMAL LOW (ref 0.70–1.30)
Est, Glom Filt Rate: >60 mL/min/{1.73_m2} (ref >60)
Glucose: 94 mg/dL (ref 65–100)
Phosphorus: 2.2 mg/dL — ABNORMAL LOW (ref 2.6–4.7)
Potassium: 3.1 mmol/L — ABNORMAL LOW (ref 3.5–5.1)
Sodium: 136 mmol/L (ref 136–145)

## 2022-04-09 LAB — TYPE AND SCREEN
ABO/Rh: A NEG
ABO/Rh: A NEG
Antibody Screen: NEGATIVE
Antibody Screen: NEGATIVE
Unit Divison: 0
Unit Divison: 0
Unit Divison: 0
Unit Divison: 0

## 2022-04-09 LAB — MAGNESIUM: Magnesium: 1.7 mg/dL (ref 1.6–2.4)

## 2022-04-09 MED ORDER — MAGNESIUM SULFATE IN D5W 1-5 GM/100ML-% IV SOLN
1-5 GM/100ML-% | Freq: Once | INTRAVENOUS | Status: AC
Start: 2022-04-09 — End: 2022-04-09
  Administered 2022-04-09: 17:00:00 1000 mg via INTRAVENOUS

## 2022-04-09 MED ORDER — PHENYLEPHRINE HCL (PRESSORS) 0.4 MG/10ML IV SOSY
0.4 MG/10ML | INTRAVENOUS | Status: DC | PRN
Start: 2022-04-09 — End: 2022-04-09
  Administered 2022-04-09 (×8): 200 via INTRAVENOUS

## 2022-04-09 MED ORDER — LACTATED RINGERS IV SOLN
INTRAVENOUS | Status: DC | PRN
Start: 2022-04-09 — End: 2022-04-09
  Administered 2022-04-09: 18:00:00 via INTRAVENOUS

## 2022-04-09 MED ORDER — K PHOS MONO-SOD PHOS DI & MONO 155-852-130 MG PO TABS
155-852-130 | Freq: Three times a day (TID) | ORAL | Status: DC
Start: 2022-04-09 — End: 2022-04-10
  Administered 2022-04-09 – 2022-04-10 (×3): 2 mg via ORAL

## 2022-04-09 MED ORDER — LIDOCAINE HCL (PF) 2 % IJ SOLN
2 % | INTRAMUSCULAR | Status: DC | PRN
Start: 2022-04-09 — End: 2022-04-09
  Administered 2022-04-09: 18:00:00 60 via INTRAVENOUS

## 2022-04-09 MED ORDER — PROPOFOL 100 MG/10ML IV EMUL
100 MG/10ML | INTRAVENOUS | Status: DC | PRN
Start: 2022-04-09 — End: 2022-04-09
  Administered 2022-04-09 (×8): 50 via INTRAVENOUS

## 2022-04-09 MED ORDER — MIDODRINE HCL 5 MG PO TABS
5 | Freq: Three times a day (TID) | ORAL | Status: DC
Start: 2022-04-09 — End: 2022-04-12
  Administered 2022-04-09 – 2022-04-12 (×10): 10 mg via ORAL

## 2022-04-09 MED ORDER — POTASSIUM CHLORIDE CRYS ER 20 MEQ PO TBCR
20 MEQ | Freq: Once | ORAL | Status: AC
Start: 2022-04-09 — End: 2022-04-09
  Administered 2022-04-09: 21:00:00 40 meq via ORAL

## 2022-04-09 MED FILL — RANOLAZINE ER 500 MG PO TB12: 500 MG | ORAL | Qty: 1

## 2022-04-09 NOTE — Progress Notes (Signed)
OT tx session attempted at 1350, however pt off floor in ENDO at this time.  Will continue to follow pt and attempt tx session at a later time as time allows.  Thank you.

## 2022-04-09 NOTE — Progress Notes (Signed)
Hematology and Oncology Progress Note    Patient: Carlos Petersen MRN: [id number]  SSN: [id number]    Date of Birth: [birth year]  Age: 71 y.o.  Sex: male      Admit Date: 04/05/2022    LOS: 4 days     Chief Complaint: Patient is feeling tired    Subjective:     Patient is feeling tired and weak.  Has mild nausea but no vomiting.  Objective:     Vitals:    04/09/22 1520 04/09/22 1530 04/09/22 1545 04/09/22 1927   BP: (!) 103/47 (!) 128/58 121/61 (!) 102/56   Pulse: 87 96 (!) 118 68   Resp: 21 17 16 17    Temp:   98.6 F (37 C) 98.2 F (36.8 C)   TempSrc:   Oral Oral   SpO2: 98% 99% 100% 95%   Weight:       Height:            Physical Exam:  Constitutional: Elderly white male looks chronically ill.  Not in any acute distress or pain.  Eyes: Sclerae anicteric. Conjunctivae shows pallor.  Patient is bitemporal wasting.  ENMT: Oral mucosa is moist.  Neck: No adenopathy.   Respiratory: Lungs are clear bilaterally.  Cardiovascular: Normal sinus rhythm.  Patient has defibrillator on left side chest wall.  Abdomen: Soft, nontender, no hepatosplenomegaly. No guarding or rigidity. Bowel sounds present.  Extremities: No edema.  Skin: No petechiae; no skin rash.  Neurologic: Alert/oriented x 3    Lab/Data Review:    Recent Results (from the past 24 hour(s))   TYPE AND SCREEN    Collection Time: 04/09/22  2:20 AM   Result Value Ref Range    Crossmatch expiration date 04/12/2022,2359     ABO/Rh A Negative     Antibody Screen Negative    CBC with Auto Differential    Collection Time: 04/09/22  2:20 AM   Result Value Ref Range    WBC 3.7 (L) 4.1 - 11.1 K/uL    RBC 2.99 (L) 4.10 - 5.70 M/uL    Hemoglobin 7.6 (L) 12.1 - 17.0 g/dL    Hematocrit 06/09/22 (L) 36.6 - 50.3 %    MCV 78.6 (L) 80.0 - 99.0 FL    MCH 25.4 (L) 26.0 - 34.0 PG    MCHC 32.3 30.0 - 36.5 g/dL    RDW 08.6 (H) 57.8 - 14.5 %    Platelets 76 (L) 150 - 400 K/uL    MPV 9.7 8.9 - 12.9 FL    Nucleated RBCs 0.0 0.0 PER 100 WBC    nRBC 0.00 0.00 - 0.01 K/uL    Neutrophils %  64 32 - 75 %    Lymphocytes % 22 12 - 49 %    Monocytes % 12 5 - 13 %    Eosinophils % 1 0 - 7 %    Basophils % 0 0 - 1 %    Immature Granulocytes 1 (H) 0 - 0.5 %    Neutrophils Absolute 2.5 1.8 - 8.0 K/UL    Lymphocytes Absolute 0.8 0.8 - 3.5 K/UL    Monocytes Absolute 0.4 0.0 - 1.0 K/UL    Eosinophils Absolute 0.0 0.0 - 0.4 K/UL    Basophils Absolute 0.0 0.0 - 0.1 K/UL    Absolute Immature Granulocyte 0.0 0.00 - 0.04 K/UL    Differential Type AUTOMATED      RBC Comment Anisocytosis  1+        RBC Comment  Microcytosis  1+       Magnesium    Collection Time: 04/09/22  2:20 AM   Result Value Ref Range    Magnesium 1.7 1.6 - 2.4 mg/dL   Renal Function Panel    Collection Time: 04/09/22  2:20 AM   Result Value Ref Range    Sodium 136 136 - 145 mmol/L    Potassium 3.1 (L) 3.5 - 5.1 mmol/L    Chloride 104 97 - 108 mmol/L    CO2 24 21 - 32 mmol/L    Anion Gap 8 5 - 15 mmol/L    Glucose 94 65 - 100 mg/dL    BUN 7 6 - 20 mg/dL    Creatinine 4.54 (L) 0.70 - 1.30 mg/dL    Bun/Cre Ratio 15 12 - 20      Est, Glom Filt Rate >60 >60 ml/min/1.76m2    Calcium 6.7 (L) 8.5 - 10.1 mg/dL    Phosphorus 2.2 (L) 2.6 - 4.7 mg/dL    Albumin 1.6 (L) 3.5 - 5.0 g/dL           Assessment and plan:     71 year old white male with history of multiple medical problems who came with increasing shortness of breath, generalized weakness and was found to have pancytopenia and severe anemia.     1) anemia: Patient came with severe anemia.  Patient has no obvious bleeding.  -His peripheral smear on 8 August shows microcytosis, anisocytosis, decrease in platelet numbers, frequent large platelets, decrease in white cells and slight increase in monocytes.  No blast cells were seen.  No schistocytes or spherocytes were seen..     -Based on this clinical picture and peripheral smear it looks like patient may have pancytopenia from possible hypersplenism from cirrhosis of the liver.  His CT of abdomen shows splenomegaly and cirrhotic changes.  -Patient had  endoscopy as an result were reviewed.  -Patient's CBC from today shows WBC 3700 hemoglobin 7.6, hematocrit 23.5 and platelet count of 76,000.  So patient's platelet counts are stable.  We will give his hemoglobin above 7.        -Patient's hemoglobin is 7.4 today and platelet counts are 79,000.  No need for transfusion today.  Will continue to monitor his H&H.    This dictation was done by dragon, Advertising account planner.  Often unanticipated grammatical, syntax, phones and other interpretive errors are inadvertently transcribed.  Please excuse errors that have escaped final proofreading.       Signed By: Grayland Jack, MD     April 09, 2022

## 2022-04-09 NOTE — Plan of Care (Signed)
Problem: Skin/Tissue Integrity  Goal: Absence of new skin breakdown  Description: 1.  Monitor for areas of redness and/or skin breakdown  2.  Assess vascular access sites hourly  3.  Every 4-6 hours minimum:  Change oxygen saturation probe site  4.  Every 4-6 hours:  If on nasal continuous positive airway pressure, respiratory therapy assess nares and determine need for appliance change or resting period.  Outcome: Progressing     Problem: ABCDS Injury Assessment  Goal: Absence of physical injury  Outcome: Progressing     Problem: Safety - Adult  Goal: Free from fall injury  Outcome: Progressing     Problem: Chronic Conditions and Co-morbidities  Goal: Patient's chronic conditions and co-morbidity symptoms are monitored and maintained or improved  Outcome: Progressing  Flowsheets (Taken 04/09/2022 0825)  Care Plan - Patient's Chronic Conditions and Co-Morbidity Symptoms are Monitored and Maintained or Improved: Monitor and assess patient's chronic conditions and comorbid symptoms for stability, deterioration, or improvement

## 2022-04-09 NOTE — Care Coordination-Inpatient (Signed)
CM reviewed chart. Patient planned for colonoscopy today.    Discharge plan to SNF/[facility]. Insurance Berkley Harvey is good until Monday evening 04/12/2022

## 2022-04-09 NOTE — Progress Notes (Signed)
Comprehensive Nutrition Assessment    Type and Reason for Visit:  Reassess, NPO/Clear Liquid (Interim)    Nutrition Recommendations/Plan:   Clear Liq, advance to goal of GI Bland as tolerated or per GI MD.  Ensure clear x3/d.  Continue to monitor and document PO and ONS intakes in I/Os.     Malnutrition Assessment:  Malnutrition Status:  Severe malnutrition (04/07/22 1301)    Context:  Chronic Illness       Nutrition Assessment:    Admitted w/ GI bleed. Significant wt loss noted per emar. Per MD Duke Salvia note 8/8, "Pt may have pancytopenia from possible hypersplenism from cirrhosis of the liver. H/o CT of abdomen shows splenomegaly and cirrhotic changes." Pt planned for colonoscopy today. Pt states has not eaten ~2wks d/t emesis after meals. Consider PN if diet not anticipated within next 1-2 days. Pt at risk for refeeding. Rec to start diet as medically able, w/ ONS as above. (8/11) Pt tolerating Clear Liq diet. Currently NPO/Clear x5 days. Pending colonoscopy today. Consider advancing diet as tolerated. If Pt is unable to tolerate PO diet, consider enteral access(NGT/NJT) for adequate energy intake given ?nonfunctional gut. RD to add ONS in interim. Labs: H/H 7.6/23.5, K 3.1, Cr 0.46, Phos 2.2, Ca 6.7, Alb 1.6. Meds: D5+ 1/2NS@ 50, crestor, midodrine, cardarone.    Nutrition Related Findings:    NFPE shows moderate-servere findings. No N/V/D/C reported per RN. Last BM 8/10. No hx chew/swallow issues. No edema. Wound Type: Skin Tears, Pressure Injury, Unstageable (Pt has sacral pressure injury with blanchable redness surrounding ulcer, right outer upper forearm skin tear, bruising all extremities. Scabs to RL lateral ankle.)       Current Nutrition Intake & Therapies:    Average Meal Intake: 51-75%  Average Supplements Intake: None Ordered  ADULT DIET; Clear Liquid    Anthropometric Measures:  Height: 180.3 cm ('5\' 11"' )  Ideal Body Weight (IBW): 172 lbs (78 kg)    Admission Body Weight: 152 lb 1.9 oz (69 kg)  Current  Body Weight: 147 lb 11.3 oz (67 kg), 85.9 % IBW. Weight Source: Bed Scale  Current BMI (kg/m2): 20.6  Usual Body Weight: 176 lb (79.8 kg) (6/1; pt reports 240# this yer)  % Weight Change (Calculated): -13.6  Weight Adjustment For: No Adjustment  BMI Categories: Underweight (BMI less than 22) age over 22    Estimated Daily Nutrient Needs:  Energy Requirements Based On: Kcal/kg  Weight Used for Energy Requirements: Current  Energy (kcal/day): 5573-2202 kcal/d (30-35 kcal/kg)  Weight Used for Protein Requirements: Current  Protein (g/day): 81-101 g/d (1.2-1.5 g/kg)  Method Used for Fluid Requirements: 1 ml/kcal  Fluid (ml/day): 2025    Nutrition Diagnosis:   Severe malnutrition, In context of chronic illness related to inadequate protein-energy intake, altered GI function (GI bleed?) as evidenced by Criteria as identified in malnutrition assessment    Nutrition Interventions:   Food and/or Nutrient Delivery: Continue Current Diet, Start Oral Nutrition Supplement  Nutrition Education/Counseling: No recommendation at this time  Coordination of Nutrition Care: Continue to monitor while inpatient  Plan of Care discussed with: RN    Goals:  Previous Goal Met: Progressing toward Goal(s)  Goals: Meet at least 75% of estimated needs, PO intake 75% or greater, by next RD assessment       Nutrition Monitoring and Evaluation:   Behavioral-Environmental Outcomes: None Identified  Food/Nutrient Intake Outcomes: Diet Advancement/Tolerance, Food and Nutrient Intake, Supplement Intake, IVF Intake  Physical Signs/Symptoms Outcomes: Biochemical Data, GI Status, Meal  Time Behavior, Weight    Discharge Planning:    Continue current diet, Continue Oral Nutrition Supplement     Terrall Laity, RD  Contact: ext. [phone].

## 2022-04-09 NOTE — Anesthesia Post-Procedure Evaluation (Signed)
Department of Anesthesiology  Postprocedure Note    Patient: Carlos Petersen  MRN: [id number]  Birthdate: 17-Dec-1950  Date of evaluation: 04/09/2022      Procedure Summary     Date: 04/09/22 Room / Location: [facility] / [facility]    Anesthesia Start: 1415 Anesthesia Stop: 9694    Procedure: COLONOSCOPY DIAGNOSTIC (Upper GI Region) Diagnosis:       Gastrointestinal hemorrhage, unspecified gastrointestinal hemorrhage type      (Gastrointestinal hemorrhage, unspecified gastrointestinal hemorrhage type [K92.2])    Surgeons: Crisoforo Oxford, MD Responsible Provider: Donivan Scull., MD    Anesthesia Type: MAC ASA Status: 4          Anesthesia Type: MAC    Aldrete Phase I: Aldrete Score: 9    Aldrete Phase II: Aldrete Score: 9      Anesthesia Post Evaluation    Patient location during evaluation: bedside  Patient participation: waiting for patient participation  Level of consciousness: sleepy but conscious  Pain score: 0  Airway patency: patent  Nausea & Vomiting: no vomiting and no nausea  Complications: no  Cardiovascular status: blood pressure returned to baseline  Respiratory status: acceptable  Hydration status: stable  Pain management: adequate

## 2022-04-09 NOTE — Progress Notes (Addendum)
Hospitalist Progress Note    NAME:   Carlos Petersen   DOB: 01/21/1951   MRN: [id number]     Subjective:   Daily Progress Note: 04/09/2022     Hospital course to date/HPI from H&P:  Carlos Petersen is a 71 y.o.  male with PMHx significant multiple comorbidities presents [facility] with complaints of weakness/dizziness.  Patient states over the past few days has been having gradual onset persistent progressive generalized weakness as well as dizziness resulting in a limitation in ambulation following which patient presented to the ED.  Of note patient is on blood thinners for blood clots.  Patient denies any fevers chills nausea vomiting orthopnea paroxysmal nocturnal dyspnea chest pain palpitations headache focal weakness loss sensation auditory or visual symptoms abdominal or urinary complaints or any other associated symptoms.  Patient endorses no recent sick contacts or travel activity  We were asked to admit for work up and evaluation of the above problems.     Chief complaint: Weakness and dizziness  04/06/2022-patient seen and examined, chart was reviewed.  Patient denied any pain, nausea, vomiting.  No other acute issues reported to me by staff at this time.    04/07/22-patient seen, chart was reviewed.  Patient for EGD/colonoscopy today per GI notes.  No bleeding issues reported by patient.  No other acute issues reported to me by staff.  BP soft but asymptomatic.  Therapy recommending SNF.    04/08/22-patient seen and examined, chart was reviewed.  Patient had EGD yesterday for colonoscopy today.  No bleeding issues reported to me by staff.    04/09/22-patient seen, chart was reviewed.  Patient could not get colonoscopy yesterday.  Colonoscopy planned again today.  No other acute issues reported to me by staff.    Current Facility-Administered Medications   Medication Dose Route Frequency    potassium chloride (KLOR-CON M) extended release tablet 40 mEq  40 mEq Oral Once    phosphorus  (K PHOS NEUTRAL) 2 tablet  500 mg Oral TID    magnesium sulfate 1000 mg in dextrose 5% 100 mL IVPB  1,000 mg IntraVENous Once    midodrine (PROAMATINE) tablet 10 mg  10 mg Oral TID WC    0.9 % sodium chloride infusion   IntraVENous PRN    midodrine (PROAMATINE) tablet 5 mg  5 mg Oral Q6H PRN    dextrose 5 % and 0.45 % NaCl with KCl 20 mEq infusion   IntraVENous Continuous    0.9 % sodium chloride infusion   IntraVENous PRN    0.9 % sodium chloride infusion   IntraVENous PRN    albuterol sulfate HFA (PROVENTIL;VENTOLIN;PROAIR) 108 (90 Base) MCG/ACT inhaler 2 puff  2 puff Inhalation Q6H PRN    amiodarone (CORDARONE) tablet 200 mg  200 mg Oral Daily    carbidopa-levodopa (SINEMET CR) 25-100 MG per extended release tablet 4 tablet  4 tablet Oral TID    ferrous sulfate (IRON 325) tablet 325 mg  325 mg Oral Daily with breakfast    gabapentin (NEURONTIN) capsule 100 mg  100 mg Oral BID    ranolazine (RANEXA) extended release tablet 500 mg  500 mg Oral BID    rosuvastatin (CRESTOR) tablet 20 mg  20 mg Oral Nightly    [Held by provider] VORTIoxetine HBr (TRINTELLIX) tablet 20 mg  20 mg Oral Daily    sodium chloride flush 0.9 % injection 5-40 mL  5-40 mL IntraVENous 2 times per day    sodium chloride flush  0.9 % injection 5-40 mL  5-40 mL IntraVENous PRN    0.9 % sodium chloride infusion   IntraVENous PRN    ondansetron (ZOFRAN-ODT) disintegrating tablet 4 mg  4 mg Oral Q8H PRN    Or    ondansetron (ZOFRAN) injection 4 mg  4 mg IntraVENous Q6H PRN    polyethylene glycol (GLYCOLAX) packet 17 g  17 g Oral Daily PRN    acetaminophen (TYLENOL) tablet 650 mg  650 mg Oral Q6H PRN    Or    acetaminophen (TYLENOL) suppository 650 mg  650 mg Rectal Q6H PRN    budesonide-formoterol (SYMBICORT) 160-4.5 MCG/ACT inhaler 2 puff  2 puff Inhalation BID RT    And    tiotropium (SPIRIVA RESPIMAT) 2.5 MCG/ACT inhaler 2 puff  2 puff Inhalation Daily RT          Objective:     BP (!) 113/52   Pulse 86   Temp 98.4 F (36.9 C) (Oral)   Resp 20    Ht 1.803 m (5\' 11" )   Wt 67 kg (147 lb 11.3 oz)   SpO2 100%   BMI 20.60 kg/m         Temp (24hrs), Avg:98.7 F (37.1 C), Min:98.4 F (36.9 C), Max:99 F (37.2 C)        PHYSICAL EXAM:  Gen thin built  Neck Supple  CVS RRR  Resp Symmetric expansion  Abdomen soft, NT/ND  Ext moves all  Neuro Alert slow speech  Psych flat affect  Skin no visible Rash        Data Review:    Recent Labs     04/09/22  0220   NA 136   K 3.1*   CL 104   CO2 24   GLUCOSE 94   BUN 7   CREATININE 0.46*   CALCIUM 6.7*   MG 1.7   PHOS 2.2*   LABALBU 1.6*       Recent Results (from the past 18 hour(s))   TYPE AND SCREEN    Collection Time: 04/09/22  2:20 AM   Result Value Ref Range    Crossmatch expiration date 04/12/2022,2359     ABO/Rh A Negative     Antibody Screen Negative    CBC with Auto Differential    Collection Time: 04/09/22  2:20 AM   Result Value Ref Range    WBC 3.7 (L) 4.1 - 11.1 K/uL    RBC 2.99 (L) 4.10 - 5.70 M/uL    Hemoglobin 7.6 (L) 12.1 - 17.0 g/dL    Hematocrit 06/09/22 (L) 36.6 - 50.3 %    MCV 78.6 (L) 80.0 - 99.0 FL    MCH 25.4 (L) 26.0 - 34.0 PG    MCHC 32.3 30.0 - 36.5 g/dL    RDW 21.3 (H) 08.6 - 14.5 %    Platelets 76 (L) 150 - 400 K/uL    MPV 9.7 8.9 - 12.9 FL    Nucleated RBCs 0.0 0.0 PER 100 WBC    nRBC 0.00 0.00 - 0.01 K/uL    Neutrophils % 64 32 - 75 %    Lymphocytes % 22 12 - 49 %    Monocytes % 12 5 - 13 %    Eosinophils % 1 0 - 7 %    Basophils % 0 0 - 1 %    Immature Granulocytes 1 (H) 0 - 0.5 %    Neutrophils Absolute 2.5 1.8 - 8.0 K/UL    Lymphocytes Absolute 0.8  0.8 - 3.5 K/UL    Monocytes Absolute 0.4 0.0 - 1.0 K/UL    Eosinophils Absolute 0.0 0.0 - 0.4 K/UL    Basophils Absolute 0.0 0.0 - 0.1 K/UL    Absolute Immature Granulocyte 0.0 0.00 - 0.04 K/UL    Differential Type AUTOMATED      RBC Comment Anisocytosis  1+        RBC Comment Microcytosis  1+       Magnesium    Collection Time: 04/09/22  2:20 AM   Result Value Ref Range    Magnesium 1.7 1.6 - 2.4 mg/dL   Renal Function Panel    Collection Time:  04/09/22  2:20 AM   Result Value Ref Range    Sodium 136 136 - 145 mmol/L    Potassium 3.1 (L) 3.5 - 5.1 mmol/L    Chloride 104 97 - 108 mmol/L    CO2 24 21 - 32 mmol/L    Anion Gap 8 5 - 15 mmol/L    Glucose 94 65 - 100 mg/dL    BUN 7 6 - 20 mg/dL    Creatinine 9.60 (L) 0.70 - 1.30 mg/dL    Bun/Cre Ratio 15 12 - 20      Est, Glom Filt Rate >60 >60 ml/min/1.30m2    Calcium 6.7 (L) 8.5 - 10.1 mg/dL    Phosphorus 2.2 (L) 2.6 - 4.7 mg/dL    Albumin 1.6 (L) 3.5 - 5.0 g/dL     No results found for this or any previous visit.     Results       ** No results found for the last 336 hours. **             CT ABDOMEN PELVIS W IV CONTRAST Result Date: 04/05/2022    Cirrhotic change with splenomegaly. Postcholecystectomy. Incidental and/or nonemergent findings are as described above.       EGD- Impression:  Abnormal esophageal motility.  Portal hypertensive gastropathy.  Normal examined duodenum.  No specimens collected.  Recommendation:  Continue present medications.      Assessment/Plan:     Suspected GI bleed with acute blood loss anemia-  S/p PRBC  Monitor H&H  IV PPI  Egd 8/9- as above  Colo 8/11-   Follow GI recommendations  Transfuse if below 7    Cirrhotic liver with splenomegaly-  Monitor INR and ammonia periodically  LFTs noted  GI on case follow recommendations  Hematology on case  for splenomegaly  Defer work-up to GI team      Electrolyte imbalances-monitor and replete  k/phos  HLD-on statins  Previous DVT-holding anticoagulation, hematology on case    Mild pancytopenia-from splenomegaly and COL,monitor CBC, hematology  on case    Chronic A-fib-continue Amio, holding anticoagulation  ?Parkinson's disease-continue Sinemet  Hypotension-  midodrine and monitor BP      Discussion/MDM:- Discussion/MDM: Patient with multiple medical comorbidities, each with high likelihood for morbidity and mortality if left untreated.   I have reviewed patient's presenting subjective and objective findings, as well as all laboratory  studies, imaging studies, and vital signs to date as well as treatment rendered and patient's response to those treatments. Pt need IV fluids with additives and Drug therapy requiring intensive monitoring for toxicity. In addition, prior medical, surgical and relevant social and family histories were reviewed. I have discussed management plan with patient/family and with nursing staff.        AD FC  DVT Prx -SCD  NOK -he did not  specify  Social Determinants of Health-unknown    Disposition-48 H,  SNF    Barriers to discharge-  medical stability, colo, consultant clearance        This is dictation was done by dragon, computer voice recognition software.  Quite often unanticipated grammatical, syntax, homophones and other interpretive errors or inadvertently transcribed by the computer software.  Please excuse errors that have escaped final proofreading. Please contact me for any questions or details.  Thank you.     Signed By: Valetta Close, MD     April 09, 2022

## 2022-04-09 NOTE — Anesthesia Pre-Procedure Evaluation (Signed)
Department of Anesthesiology  Preprocedure Note       Name:  Carlos Petersen   Age:  71 y.o.  DOB:  1951-08-04                                          MRN:  [id number]         Date:  04/09/2022      Surgeon: Juliann Mule):  Crisoforo Oxford, MD    Procedure: Procedure(s):  COLONOSCOPY DIAGNOSTIC    Medications prior to admission:   Prior to Admission medications    Medication Sig Start Date End Date Taking? Authorizing Provider   ferrous sulfate (IRON 325) 325 (65 Fe) MG tablet Take 1 tablet by mouth daily (with breakfast) 02/01/22 03/03/22  Jan A Zarefoss, APRN - NP   cholestyramine Lucrezia Starch) 4 g packet Take 1 packet by mouth 3 times daily (with meals) for 7 days 01/27/22 02/03/22  Ahmed I Moreen Fowler, MD   VORTIoxetine HBr (TRINTELLIX) 20 MG TABS tablet Take 1 tablet by mouth daily    Historical Provider, MD   fluticasone-umeclidin-vilant (TRELEGY ELLIPTA) 100-62.5-25 MCG/ACT AEPB inhaler Inhale 1 puff into the lungs daily 11/17/21   Ar Automatic Reconciliation   alendronate (FOSAMAX) 70 MG tablet Take 1 tablet by mouth every 7 days On Wednesday    Historical Provider, MD   albuterol sulfate HFA (PROVENTIL;VENTOLIN;PROAIR) 108 (90 Base) MCG/ACT inhaler Inhale 2 puffs into the lungs every 6 hours as needed    Historical Provider, MD   amiodarone (CORDARONE) 200 MG tablet Take 1 tablet by mouth daily 11/17/21   Historical Provider, MD   apixaban (ELIQUIS) 5 MG TABS tablet Take 1 tablet by mouth 2 times daily 11/17/21   Historical Provider, MD   carbidopa-levodopa (SINEMET CR) 50-200 MG per extended release tablet Take 2 tablets by mouth 3 times daily 11/17/21 11/12/22  Historical Provider, MD   carvedilol (COREG) 6.25 MG tablet Take 1 tablet by mouth 2 times daily 11/17/21   Historical Provider, MD   JARDIANCE 10 MG tablet Take 1 tablet by mouth daily 11/18/21   Historical Provider, MD   gabapentin (NEURONTIN) 100 MG capsule Take 1 capsule by mouth in the morning and at bedtime. 12/23/21   Historical Provider, MD   omeprazole (PRILOSEC) 40 MG  delayed release capsule Take 1 capsule by mouth daily    Historical Provider, MD   ranolazine (RANEXA) 500 MG extended release tablet Take 1 tablet by mouth 2 times daily 11/27/21   Historical Provider, MD   rosuvastatin (CRESTOR) 20 MG tablet Take 1 tablet by mouth daily 11/27/21 11/22/22  Historical Provider, MD       Current medications:    No current facility-administered medications for this visit.     No current outpatient medications on file.     Facility-Administered Medications Ordered in Other Visits   Medication Dose Route Frequency Provider Last Rate Last Admin   . potassium chloride (KLOR-CON M) extended release tablet 40 mEq  40 mEq Oral Once Sami Theresia Lo, MD       . phosphorus (K PHOS NEUTRAL) 2 tablet  500 mg Oral TID Sami Theresia Lo, MD       . magnesium sulfate 1000 mg in dextrose 5% 100 mL IVPB  1,000 mg IntraVENous Once Kathlynn Grate, MD 100 mL/hr at 04/09/22 1242 1,000 mg at 04/09/22 1242   .  midodrine (PROAMATINE) tablet 10 mg  10 mg Oral TID WC Sami Theresia Lo, MD   10 mg at 04/09/22 1242   . 0.9 % sodium chloride infusion   IntraVENous PRN Sami Theresia Lo, MD       . midodrine (PROAMATINE) tablet 5 mg  5 mg Oral Q6H PRN Sami Theresia Lo, MD   5 mg at 04/08/22 0325   . dextrose 5 % and 0.45 % NaCl with KCl 20 mEq infusion   IntraVENous Continuous Sami Theresia Lo, MD 50 mL/hr at 04/09/22 0311 New Bag at 04/09/22 0311   . 0.9 % sodium chloride infusion   IntraVENous PRN Sandrea Hughs, MD       . 0.9 % sodium chloride infusion   IntraVENous PRN Ahmed Ned Card, MD       . albuterol sulfate HFA (PROVENTIL;VENTOLIN;PROAIR) 108 (90 Base) MCG/ACT inhaler 2 puff  2 puff Inhalation Q6H PRN Ahmed Ned Card, MD       . amiodarone (CORDARONE) tablet 200 mg  200 mg Oral Daily Ahmed Ned Card, MD   200 mg at 04/09/22 0816   . carbidopa-levodopa (SINEMET CR) 25-100 MG per extended release tablet 4 tablet  4 tablet Oral TID Ahmed Ned Card, MD   4 tablet at 04/08/22 2151   . ferrous sulfate (IRON 325)  tablet 325 mg  325 mg Oral Daily with breakfast Ahmed Ned Card, MD   325 mg at 04/08/22 8413   . gabapentin (NEURONTIN) capsule 100 mg  100 mg Oral BID Ahmed Ned Card, MD   100 mg at 04/08/22 2151   . ranolazine (RANEXA) extended release tablet 500 mg  500 mg Oral BID Kathlynn Grate, MD   500 mg at 04/08/22 2151   . rosuvastatin (CRESTOR) tablet 20 mg  20 mg Oral Nightly Ahmed Ned Card, MD   20 mg at 04/08/22 2151   . [Held by provider] VORTIoxetine HBr (TRINTELLIX) tablet 20 mg  20 mg Oral Daily Ahmed Ned Card, MD       . sodium chloride flush 0.9 % injection 5-40 mL  5-40 mL IntraVENous 2 times per day Ahmed Ned Card, MD   10 mL at 04/09/22 0816   . sodium chloride flush 0.9 % injection 5-40 mL  5-40 mL IntraVENous PRN Ahmed Ned Card, MD       . 0.9 % sodium chloride infusion   IntraVENous PRN Ahmed Ned Card, MD       . ondansetron (ZOFRAN-ODT) disintegrating tablet 4 mg  4 mg Oral Q8H PRN Ahmed Ned Card, MD        Or   . ondansetron [facility]) injection 4 mg  4 mg IntraVENous Q6H PRN Ahmed Ned Card, MD   4 mg at 04/08/22 0447   . polyethylene glycol (GLYCOLAX) packet 17 g  17 g Oral Daily PRN Ahmed Ned Card, MD       . acetaminophen (TYLENOL) tablet 650 mg  650 mg Oral Q6H PRN Ahmed Ned Card, MD        Or   . acetaminophen (TYLENOL) suppository 650 mg  650 mg Rectal Q6H PRN Ahmed Ned Card, MD       . budesonide-formoterol [facility]) 160-4.5 MCG/ACT inhaler 2 puff  2 puff Inhalation BID RT Ahmed Ned Card, MD   2 puff at 04/09/22 0747    And   . tiotropium (SPIRIVA RESPIMAT)  2.5 MCG/ACT inhaler 2 puff  2 puff Inhalation Daily RT Ahmed Ned Card, MD   2 puff at 04/09/22 0747       Allergies:  No Known Allergies    Problem List:    Patient Active Problem List   Diagnosis Code   . 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 Permanent atrial fibrillation (HCC) I48.21   . Acute pain of right shoulder M25.511   . Major depressive disorder, recurrent, unspecified ([place]) F33.9   . Tobacco dependency F17.200   . Gastroesophageal reflux disease with esophagitis without hemorrhage K21.00   . Depression F32.A   . Fall W19.[name]   . Pacemaker Z95.0   . Tobacco abuse counseling Z71.6   . Mixed hyperlipidemia E78.2   . Parkinson's disease ([place]) G20   . Major depressive disorder, recurrent, moderate (HCC) F33.1   . Major depressive disorder, recurrent, mild (HCC) F33.0   . Hypertensive heart disease I11.9   . Chronic renal disease, stage III (HCC) N18.30   . Hypotension due to drugs I95.2   . Severe obesity (BMI 35.0-39.9) with comorbidity ([place]) E66.01   . PAD (peripheral artery disease) (HCC) I73.9   . Hip fracture ([place]) S72.009A   . AMS (altered mental status) R41.82   . C. difficile colitis A04.72   . Diarrhea R19.7   . Diabetes ([place]) E11.9   . Generalized weakness R53.1   . Iron deficiency anemia D50.9   . GI bleed K92.2       Past Medical History:        Diagnosis Date   . Cerebral artery occlusion with cerebral infarction ([place])    . COPD (chronic obstructive pulmonary disease) ([place])    . Depression    . Diabetes ([place])    . Emphysema lung ([place])    . Heart disease    . Hypercholesterolemia    . Parkinson disease ([place])    . Pulmonary embolism [facility])        Past Surgical History:        Procedure Laterality Date   . CARDIAC DEFIBRILLATOR PLACEMENT     . COLONOSCOPY     . COLONOSCOPY N/A 04/07/2022    COLONOSCOPY DIAGNOSTIC performed by Crisoforo Oxford, MD at [place]   . COLONOSCOPY N/A 04/07/2022    COLONOSCOPY POLYPECTOMY SNARE/COLD BIOPSY performed by Crisoforo Oxford, MD at [place]   . PACEMAKER     . PACEMAKER PLACEMENT     . TOTAL HIP ARTHROPLASTY Right 01/2016   . UPPER GASTROINTESTINAL ENDOSCOPY N/A 04/07/2022    EGD ESOPHAGOGASTRODUODENOSCOPY performed by Crisoforo Oxford, MD at [facility] ENDOSCOPY       Social History:    Social History      Tobacco Use   . Smoking status: Every Day     Packs/day: 0.50     Types: Cigarettes   . Smokeless tobacco: Never   Substance Use Topics   . Alcohol use: Never                                Ready to quit: Not Answered  Counseling given: Not Answered      Vital Signs (Current):   There were no vitals filed for this visit.  BP Readings from Last 3 Encounters:   04/09/22 (!) 113/52   02/01/22 127/61   01/22/22 125/73       NPO Status:                                                                                 BMI:   Wt Readings from Last 3 Encounters:   04/09/22 67 kg (147 lb 11.3 oz)   01/26/22 95.3 kg (210 lb)   01/22/22 90.7 kg (200 lb)     There is no height or weight on file to calculate BMI.    CBC:   Lab Results   Component Value Date/Time    WBC 3.7 04/09/2022 02:20 AM    RBC 2.99 04/09/2022 02:20 AM    HGB 7.6 04/09/2022 02:20 AM    HCT 23.5 04/09/2022 02:20 AM    MCV 78.6 04/09/2022 02:20 AM    RDW 18.9 04/09/2022 02:20 AM    PLT 76 04/09/2022 02:20 AM       CMP:   Lab Results   Component Value Date/Time    NA 136 04/09/2022 02:20 AM    K 3.1 04/09/2022 02:20 AM    CL 104 04/09/2022 02:20 AM    CO2 24 04/09/2022 02:20 AM    BUN 7 04/09/2022 02:20 AM    CREATININE 0.46 04/09/2022 02:20 AM    AGRATIO 0.4 12/17/2021 10:39 AM    LABGLOM >60 04/09/2022 02:20 AM    LABGLOM 59 01/07/2021 11:00 AM    GLUCOSE 94 04/09/2022 02:20 AM    PROT 6.7 04/06/2022 02:55 AM    PROT 6.8 04/06/2022 02:55 AM    CALCIUM 6.7 04/09/2022 02:20 AM    BILITOT 1.0 04/06/2022 02:55 AM    BILITOT 1.0 04/06/2022 02:55 AM    ALKPHOS 57 04/06/2022 02:55 AM    ALKPHOS 57 04/06/2022 02:55 AM    ALKPHOS 82 12/17/2021 10:39 AM    AST 19 04/06/2022 02:55 AM    AST 19 04/06/2022 02:55 AM    ALT <6 04/06/2022 02:55 AM    ALT <6 04/06/2022 02:55 AM       POC Tests: No results for input(s): POCGLU, POCNA, POCK, POCCL, POCBUN, POCHEMO, POCHCT in the last 72 hours.    Coags:   Lab Results   Component Value  Date/Time    PROTIME 17.4 04/07/2022 04:39 AM    INR 1.4 04/07/2022 04:39 AM       HCG (If Applicable): No results found for: PREGTESTUR, PREGSERUM, HCG, HCGQUANT     ABGs: No results found for: PHART, PO2ART, PCO2ART, HCO3ART, BEART, O2SATART     Type & Screen (If Applicable):  No results found for: LABABO, LABRH    Drug/Infectious Status (If Applicable):  No results found for: HIV, HEPCAB    COVID-19 Screening (If Applicable): No results found for: COVID19        Anesthesia Evaluation  Patient summary reviewed and Nursing notes reviewed no history of anesthetic complications:   Airway: Mallampati: II  TM distance: >3 FB   Neck ROM: full  Mouth opening: > = 3 FB   Dental:    (+) edentulous  Pulmonary:normal exam  breath sounds clear to auscultation  (+) COPD: moderate,  current smoker                           Cardiovascular:    (+) pacemaker: AICD and pacemaker, dysrhythmias: atrial fibrillation, hyperlipidemia      ECG reviewed  Rhythm: regular  Rate: normal  Echocardiogram reviewed               ROS comment: 11/2021 TTE:    Interpretation Summary    Result status: Final result  This is a summary report. The complete report is available in the patient's medical record. If you cannot access the medical record, please contact the sending organization for a detailed fax or copy.    .  LeftVentricle: Normal left ventricular systolic function with a visually estimated EF of 60 - 65%. EF by 2D Simpsons Biplane is 62%. Left ventricle size is normal. LVIDd index is 2.50 cm/m2. Normal wall thickness. Mass index 2D is 91.7 g/m2. Normal wall motion. Normal diastolic function.  .  Technical qualifiers: Echo study was technically difficult.         Neuro/Psych:   Negative Neuro/Psych ROS  (+) CVA:, psychiatric history:            GI/Hepatic/Renal:   (+) GERD:, liver disease (Cirrhosis):, bowel prep,          ROS comment: Diagnosis: Gastrointestinal hemorrhage, unspecified gastrointestinal hemorrhage type .   Endo/Other:     (+) DiabetesType II DM, , blood dyscrasia: anemia and thrombocytopenia:., .                  ROS comment: 04/09/22 02:20  WBC: 3.7 (L)  RBC: 2.99 (L)  Hemoglobin Quant: 7.6 (L)  Hematocrit: 23.5 (L)  MCV: 78.6 (L)  MCH: 25.4 (L)  MCHC: 32.3  MPV: 9.7  RDW: 18.9 (H)  Platelet Count: 76 (L)      (L): Data is abnormally low  (H): Data is abnormally high      04/07/22 04:39  Prothrombin Time: 17.4 (H)  INR: 1.4 (H)      (H): Data is abnormally high Abdominal:              PE comment: Deferred.   Vascular:   + PVD, aortic or cerebral, DVT, PE.       Other Findings:             Anesthesia Plan      MAC and TIVA     ASA 4     (Standard ASA monitors: continuous EKG, BP, HR, pulse oximeter, and capnography.)  Induction: intravenous.    MIPS: Prophylactic antiemetics administered.  Anesthetic plan and risks discussed with patient (and family, if present.).                        Donivan Scull., MD   04/09/2022

## 2022-04-10 LAB — CBC WITH AUTO DIFFERENTIAL
Absolute Immature Granulocyte: 0 10*3/uL (ref 0.00–0.04)
Basophils %: 0 % (ref 0–1)
Basophils Absolute: 0 10*3/uL (ref 0.0–0.1)
Eosinophils %: 1 % (ref 0–7)
Eosinophils Absolute: 0 10*3/uL (ref 0.0–0.4)
Hematocrit: 26.3 % — ABNORMAL LOW (ref 36.6–50.3)
Hemoglobin: 8.3 g/dL — ABNORMAL LOW (ref 12.1–17.0)
Immature Granulocytes: 1 % — ABNORMAL HIGH (ref 0–0.5)
Lymphocytes %: 20 % (ref 12–49)
Lymphocytes Absolute: 0.7 10*3/uL — ABNORMAL LOW (ref 0.8–3.5)
MCH: 25.2 PG — ABNORMAL LOW (ref 26.0–34.0)
MCHC: 31.6 g/dL (ref 30.0–36.5)
MCV: 79.9 FL — ABNORMAL LOW (ref 80.0–99.0)
MPV: 9.7 FL (ref 8.9–12.9)
Monocytes %: 10 % (ref 5–13)
Monocytes Absolute: 0.4 10*3/uL (ref 0.0–1.0)
Neutrophils %: 68 % (ref 32–75)
Neutrophils Absolute: 2.4 10*3/uL (ref 1.8–8.0)
Nucleated RBCs: 0 PER 100 WBC
Platelets: 83 10*3/uL — ABNORMAL LOW (ref 150–400)
RBC: 3.29 M/uL — ABNORMAL LOW (ref 4.10–5.70)
RDW: 19.3 % — ABNORMAL HIGH (ref 11.5–14.5)
WBC: 3.5 10*3/uL — ABNORMAL LOW (ref 4.1–11.1)
nRBC: 0 10*3/uL (ref 0.00–0.01)

## 2022-04-10 LAB — RENAL FUNCTION PANEL
Albumin: 1.7 g/dL — ABNORMAL LOW (ref 3.5–5.0)
Anion Gap: 4 mmol/L — ABNORMAL LOW (ref 5–15)
BUN: 6 mg/dL (ref 6–20)
Bun/Cre Ratio: 12 (ref 12–20)
CO2: 26 mmol/L (ref 21–32)
Calcium: 7.1 mg/dL — ABNORMAL LOW (ref 8.5–10.1)
Chloride: 109 mmol/L — ABNORMAL HIGH (ref 97–108)
Creatinine: 0.51 mg/dL — ABNORMAL LOW (ref 0.70–1.30)
Est, Glom Filt Rate: >60 mL/min/{1.73_m2} (ref >60)
Glucose: 104 mg/dL — ABNORMAL HIGH (ref 65–100)
Phosphorus: 2.4 mg/dL — ABNORMAL LOW (ref 2.6–4.7)
Potassium: 3.6 mmol/L (ref 3.5–5.1)
Sodium: 139 mmol/L (ref 136–145)

## 2022-04-10 LAB — MAGNESIUM: Magnesium: 2 mg/dL (ref 1.6–2.4)

## 2022-04-10 MED ORDER — K PHOS MONO-SOD PHOS DI & MONO 155-852-130 MG PO TABS
155-852-130 | Freq: Four times a day (QID) | ORAL | Status: DC
Start: 2022-04-10 — End: 2022-04-12
  Administered 2022-04-11 – 2022-04-12 (×6): 2 mg via ORAL

## 2022-04-10 MED FILL — PHOSPHA 250 NEUTRAL 155-852-130 MG PO TABS: 155-852-130 MG | ORAL | Qty: 2

## 2022-04-10 NOTE — Plan of Care (Signed)
Problem: Skin/Tissue Integrity  Goal: Absence of new skin breakdown  Description: 1.  Monitor for areas of redness and/or skin breakdown  2.  Assess vascular access sites hourly  3.  Every 4-6 hours minimum:  Change oxygen saturation probe site  4.  Every 4-6 hours:  If on nasal continuous positive airway pressure, respiratory therapy assess nares and determine need for appliance change or resting period.  04/10/2022 1217 by Wendall Mola, RN  Outcome: Progressing  04/10/2022 0544 by Buzzy Han, RN  Outcome: Progressing     Problem: ABCDS Injury Assessment  Goal: Absence of physical injury  04/10/2022 1217 by Wendall Mola, RN  Outcome: Progressing  04/10/2022 0544 by Buzzy Han, RN  Outcome: Progressing     Problem: Safety - Adult  Goal: Free from fall injury  04/10/2022 1217 by Wendall Mola, RN  Outcome: Progressing  04/10/2022 0544 by Buzzy Han, RN  Outcome: Progressing     Problem: Chronic Conditions and Co-morbidities  Goal: Patient's chronic conditions and co-morbidity symptoms are monitored and maintained or improved  04/10/2022 1217 by Wendall Mola, RN  Outcome: Progressing  Flowsheets (Taken 04/10/2022 0900)  Care Plan - Patient's Chronic Conditions and Co-Morbidity Symptoms are Monitored and Maintained or Improved: Monitor and assess patient's chronic conditions and comorbid symptoms for stability, deterioration, or improvement  04/10/2022 0544 by Buzzy Han, RN  Outcome: Progressing

## 2022-04-10 NOTE — Plan of Care (Signed)
Problem: Discharge Planning  Goal: Discharge to home or other facility with appropriate resources  Outcome: Progressing     Problem: Skin/Tissue Integrity  Goal: Absence of new skin breakdown  Description: 1.  Monitor for areas of redness and/or skin breakdown  2.  Assess vascular access sites hourly  3.  Every 4-6 hours minimum:  Change oxygen saturation probe site  4.  Every 4-6 hours:  If on nasal continuous positive airway pressure, respiratory therapy assess nares and determine need for appliance change or resting period.  Outcome: Progressing     Problem: ABCDS Injury Assessment  Goal: Absence of physical injury  Outcome: Progressing     Problem: Safety - Adult  Goal: Free from fall injury  Outcome: Progressing     Problem: Chronic Conditions and Co-morbidities  Goal: Patient's chronic conditions and co-morbidity symptoms are monitored and maintained or improved  Outcome: Progressing

## 2022-04-10 NOTE — Progress Notes (Signed)
Hematology and Oncology Progress Note    Patient: Carlos Petersen MRN: [id number]  SSN: [id number]    Date of Birth: 01-30-1951  Age: 71 y.o.  Sex: male      Admit Date: 04/05/2022    LOS: 5 days     Chief Complaint: Patient is having diarrhea    Subjective:   Patient is having diarrhea.  He does not have any blood in stool.  According to patient's nurse he does have some mucoid component to his bowel movement.  Patient is feeling exhausted and tired.  He has occasional abdominal discomfort.    Objective:     Vitals:    04/10/22 0242 04/10/22 0541 04/10/22 0736 04/10/22 0738   BP: (!) 108/56 (!) 111/59 (!) 112/52    Pulse: 80 72 77    Resp: 18 19 18     Temp: 97.8 F (36.6 C) 98.1 F (36.7 C) 98.1 F (36.7 C)    TempSrc: Oral Oral Oral    SpO2: 95% 97% 99% 98%   Weight:       Height:            Physical Exam:  Constitutional: Elderly white male looks chronically ill.  Not in any acute distress or pain.  Eyes: Sclerae anicteric. Conjunctivae shows pallor.  Patient is bitemporal wasting.  ENMT: Oral mucosa is moist.  Neck: No adenopathy.   Respiratory: Lungs are clear bilaterally.  Cardiovascular: Normal sinus rhythm.  Patient has defibrillator on left side chest wall.  Abdomen: Soft, nontender, no hepatosplenomegaly. No guarding or rigidity. Bowel sounds present.  Extremities: No edema.  Skin: No petechiae; no skin rash.  Neurologic: Alert/oriented x 3    Lab/Data Review:    Recent Results (from the past 24 hour(s))   CBC with Auto Differential    Collection Time: 04/10/22  6:30 AM   Result Value Ref Range    WBC 3.5 (L) 4.1 - 11.1 K/uL    RBC 3.29 (L) 4.10 - 5.70 M/uL    Hemoglobin 8.3 (L) 12.1 - 17.0 g/dL    Hematocrit 06/10/22 (L) 36.6 - 50.3 %    MCV 79.9 (L) 80.0 - 99.0 FL    MCH 25.2 (L) 26.0 - 34.0 PG    MCHC 31.6 30.0 - 36.5 g/dL    RDW 03.4 (H) 74.2 - 14.5 %    Platelets 83 (L) 150 - 400 K/uL    MPV 9.7 8.9 - 12.9 FL    Nucleated RBCs 0.0 0.0 PER 100 WBC    nRBC 0.00 0.00 - 0.01 K/uL    Neutrophils % 68  32 - 75 %    Lymphocytes % 20 12 - 49 %    Monocytes % 10 5 - 13 %    Eosinophils % 1 0 - 7 %    Basophils % 0 0 - 1 %    Immature Granulocytes 1 (H) 0 - 0.5 %    Neutrophils Absolute 2.4 1.8 - 8.0 K/UL    Lymphocytes Absolute 0.7 (L) 0.8 - 3.5 K/UL    Monocytes Absolute 0.4 0.0 - 1.0 K/UL    Eosinophils Absolute 0.0 0.0 - 0.4 K/UL    Basophils Absolute 0.0 0.0 - 0.1 K/UL    Absolute Immature Granulocyte 0.0 0.00 - 0.04 K/UL    Differential Type Smear Scanned      RBC Comment Normocytic, Normochromic      RBC Comment Anisocytosis  1+       Renal Function Panel  Collection Time: 04/10/22  6:30 AM   Result Value Ref Range    Sodium 139 136 - 145 mmol/L    Potassium 3.6 3.5 - 5.1 mmol/L    Chloride 109 (H) 97 - 108 mmol/L    CO2 26 21 - 32 mmol/L    Anion Gap 4 (L) 5 - 15 mmol/L    Glucose 104 (H) 65 - 100 mg/dL    BUN 6 6 - 20 mg/dL    Creatinine 1.82 (L) 0.70 - 1.30 mg/dL    Bun/Cre Ratio 12 12 - 20      Est, Glom Filt Rate >60 >60 ml/min/1.79m2    Calcium 7.1 (L) 8.5 - 10.1 mg/dL    Phosphorus 2.4 (L) 2.6 - 4.7 mg/dL    Albumin 1.7 (L) 3.5 - 5.0 g/dL   Magnesium    Collection Time: 04/10/22  6:30 AM   Result Value Ref Range    Magnesium 2.0 1.6 - 2.4 mg/dL           Assessment and plan:     71 year old white male with history of multiple medical problems who came with increasing shortness of breath, generalized weakness and was found to have pancytopenia and severe anemia.     1) anemia: Patient came with severe anemia.  Patient has no obvious bleeding.  -His peripheral smear on 8 August shows microcytosis, anisocytosis, decrease in platelet numbers, frequent large platelets, decrease in white cells and slight increase in monocytes.  No blast cells were seen.  No schistocytes or spherocytes were seen..     -Based on this clinical picture and peripheral smear it looks like patient may have pancytopenia from possible hypersplenism from cirrhosis of the liver.  His CT of abdomen shows splenomegaly and cirrhotic  changes.  -Patient had endoscopy as an result were reviewed.    8/12: Patient's hemoglobin is 8.3 and hematocrit 26.3 which is much better than yesterday.  Recent events noted.  We will continue to follow his CBC closely.  We will check pathology report results when it is available    This dictation was done by dragon, computer voice recognition software.  Often unanticipated grammatical, syntax, phones and other interpretive errors are inadvertently transcribed.  Please excuse errors that have escaped final proofreading.       Signed By: Grayland Jack, MD     April 10, 2022

## 2022-04-10 NOTE — Progress Notes (Signed)
Hospitalist Progress Note    NAME:   Carlos Petersen   DOB: 17-Aug-1951   MRN: [id number]     Subjective:   Daily Progress Note: 04/10/2022     Hospital course to date/HPI from H&P:  Carlos Petersen is a 71 y.o.  male with PMHx significant multiple comorbidities presents [facility] with complaints of weakness/dizziness.  Patient states over the past few days has been having gradual onset persistent progressive generalized weakness as well as dizziness resulting in a limitation in ambulation following which patient presented to the ED.  Of note patient is on blood thinners for blood clots.  Patient denies any fevers chills nausea vomiting orthopnea paroxysmal nocturnal dyspnea chest pain palpitations headache focal weakness loss sensation auditory or visual symptoms abdominal or urinary complaints or any other associated symptoms.  Patient endorses no recent sick contacts or travel activity  We were asked to admit for work up and evaluation of the above problems.     Chief complaint: Weakness and dizziness  04/06/2022-patient seen and examined, chart was reviewed.  Patient denied any pain, nausea, vomiting.  No other acute issues reported to me by staff at this time.    04/07/22-patient seen, chart was reviewed.  Patient for EGD/colonoscopy today per GI notes.  No bleeding issues reported by patient.  No other acute issues reported to me by staff.  BP soft but asymptomatic.  Therapy recommending SNF.    04/08/22-patient seen and examined, chart was reviewed.  Patient had EGD yesterday for colonoscopy today.  No bleeding issues reported to me by staff.    04/09/22-patient seen, chart was reviewed.  Patient could not get colonoscopy yesterday.  Colonoscopy planned again today.  No other acute issues reported to me by staff.    04/10/22-patient seen, chart was reviewed.  Patient finally had colonoscopy yesterday.  No other acute issues reported to me at this time by patient or staff.    Current  Facility-Administered Medications   Medication Dose Route Frequency    phosphorus (K PHOS NEUTRAL) 2 tablet  500 mg Oral 4x Daily    midodrine (PROAMATINE) tablet 10 mg  10 mg Oral TID WC    0.9 % sodium chloride infusion   IntraVENous PRN    midodrine (PROAMATINE) tablet 5 mg  5 mg Oral Q6H PRN    dextrose 5 % and 0.45 % NaCl with KCl 20 mEq infusion   IntraVENous Continuous    0.9 % sodium chloride infusion   IntraVENous PRN    0.9 % sodium chloride infusion   IntraVENous PRN    albuterol sulfate HFA (PROVENTIL;VENTOLIN;PROAIR) 108 (90 Base) MCG/ACT inhaler 2 puff  2 puff Inhalation Q6H PRN    amiodarone (CORDARONE) tablet 200 mg  200 mg Oral Daily    carbidopa-levodopa (SINEMET CR) 25-100 MG per extended release tablet 4 tablet  4 tablet Oral TID    ferrous sulfate (IRON 325) tablet 325 mg  325 mg Oral Daily with breakfast    gabapentin (NEURONTIN) capsule 100 mg  100 mg Oral BID    ranolazine (RANEXA) extended release tablet 500 mg  500 mg Oral BID    rosuvastatin (CRESTOR) tablet 20 mg  20 mg Oral Nightly    [Held by provider] VORTIoxetine HBr (TRINTELLIX) tablet 20 mg  20 mg Oral Daily    sodium chloride flush 0.9 % injection 5-40 mL  5-40 mL IntraVENous 2 times per day    sodium chloride flush 0.9 % injection 5-40 mL  5-40 mL IntraVENous PRN    0.9 % sodium chloride infusion   IntraVENous PRN    ondansetron (ZOFRAN-ODT) disintegrating tablet 4 mg  4 mg Oral Q8H PRN    Or    ondansetron (ZOFRAN) injection 4 mg  4 mg IntraVENous Q6H PRN    polyethylene glycol (GLYCOLAX) packet 17 g  17 g Oral Daily PRN    acetaminophen (TYLENOL) tablet 650 mg  650 mg Oral Q6H PRN    Or    acetaminophen (TYLENOL) suppository 650 mg  650 mg Rectal Q6H PRN    budesonide-formoterol (SYMBICORT) 160-4.5 MCG/ACT inhaler 2 puff  2 puff Inhalation BID RT    And    tiotropium (SPIRIVA RESPIMAT) 2.5 MCG/ACT inhaler 2 puff  2 puff Inhalation Daily RT          Objective:     BP (!) 112/52   Pulse 77   Temp 98.1 F (36.7 C) (Oral)   Resp  18   Ht 1.803 m (5\' 11" )   Wt 67 kg (147 lb 11.3 oz)   SpO2 98%   BMI 20.60 kg/m         Temp (24hrs), Avg:98.2 F (36.8 C), Min:97.8 F (36.6 C), Max:98.6 F (37 C)        PHYSICAL EXAM:  Gen thin built  Neck Supple  CVS RRR  Resp Symmetric expansion  Abdomen soft, NT/ND  Ext moves all  Neuro Alert slow speech  Psych flat affect  Skin no visible Rash        Data Review:    Recent Labs     04/10/22  0630   NA 139   K 3.6   CL 109*   CO2 26   GLUCOSE 104*   BUN 6   CREATININE 0.51*   CALCIUM 7.1*   MG 2.0   PHOS 2.4*   LABALBU 1.7*       Recent Results (from the past 18 hour(s))   CBC with Auto Differential    Collection Time: 04/10/22  6:30 AM   Result Value Ref Range    WBC 3.5 (L) 4.1 - 11.1 K/uL    RBC 3.29 (L) 4.10 - 5.70 M/uL    Hemoglobin 8.3 (L) 12.1 - 17.0 g/dL    Hematocrit 06/10/22 (L) 36.6 - 50.3 %    MCV 79.9 (L) 80.0 - 99.0 FL    MCH 25.2 (L) 26.0 - 34.0 PG    MCHC 31.6 30.0 - 36.5 g/dL    RDW 99.2 (H) 42.6 - 14.5 %    Platelets 83 (L) 150 - 400 K/uL    MPV 9.7 8.9 - 12.9 FL    Nucleated RBCs 0.0 0.0 PER 100 WBC    nRBC 0.00 0.00 - 0.01 K/uL    Neutrophils % 68 32 - 75 %    Lymphocytes % 20 12 - 49 %    Monocytes % 10 5 - 13 %    Eosinophils % 1 0 - 7 %    Basophils % 0 0 - 1 %    Immature Granulocytes 1 (H) 0 - 0.5 %    Neutrophils Absolute 2.4 1.8 - 8.0 K/UL    Lymphocytes Absolute 0.7 (L) 0.8 - 3.5 K/UL    Monocytes Absolute 0.4 0.0 - 1.0 K/UL    Eosinophils Absolute 0.0 0.0 - 0.4 K/UL    Basophils Absolute 0.0 0.0 - 0.1 K/UL    Absolute Immature Granulocyte 0.0 0.00 - 0.04 K/UL  Differential Type Smear Scanned      RBC Comment Normocytic, Normochromic      RBC Comment Anisocytosis  1+       Renal Function Panel    Collection Time: 04/10/22  6:30 AM   Result Value Ref Range    Sodium 139 136 - 145 mmol/L    Potassium 3.6 3.5 - 5.1 mmol/L    Chloride 109 (H) 97 - 108 mmol/L    CO2 26 21 - 32 mmol/L    Anion Gap 4 (L) 5 - 15 mmol/L    Glucose 104 (H) 65 - 100 mg/dL    BUN 6 6 - 20 mg/dL     Creatinine 2.02 (L) 0.70 - 1.30 mg/dL    Bun/Cre Ratio 12 12 - 20      Est, Glom Filt Rate >60 >60 ml/min/1.83m2    Calcium 7.1 (L) 8.5 - 10.1 mg/dL    Phosphorus 2.4 (L) 2.6 - 4.7 mg/dL    Albumin 1.7 (L) 3.5 - 5.0 g/dL   Magnesium    Collection Time: 04/10/22  6:30 AM   Result Value Ref Range    Magnesium 2.0 1.6 - 2.4 mg/dL     No results found for this or any previous visit.     Results       ** No results found for the last 336 hours. **             CT ABDOMEN PELVIS W IV CONTRAST Result Date: 04/05/2022    Cirrhotic change with splenomegaly. Postcholecystectomy. Incidental and/or nonemergent findings are as described above.       EGD- Impression:  Abnormal esophageal motility.  Portal hypertensive gastropathy.  Normal examined duodenum.  No specimens collected.  Recommendation:  Continue present medications.      Colonosocpy 04/09/22- Impression:         -  Perianal skin tags found on perianal exam.         -  Many 5 to 13 mm polyps in the ascending colon and in the transverse colon,            removed with a cold biopsy forceps.  Resected and retrieved.         -  Moderate diverticulosis in the descending colon and in the sigmoid colon.            Peri-diverticular erythema was seen.         -  Internal hemorrhoids.  Recommendation:         -  Await pathology results.         -  Resume regular diet and high fiber diet.         -  Continue present medications.         -  Repeat colonoscopy in 3 years for surveillance.    Assessment/Plan:     Suspected GI bleed with acute blood loss anemia-  S/p PRBC  Monitor H&H  IV PPI  Egd 8/9- as above  Colo 8/11-  as above  Follow GI recommendations  High fiber diet  Transfuse if below 7    Cirrhotic liver with splenomegaly-  Monitor INR and ammonia periodically  LFTs noted  GI on case follow recommendations  Hematology on case  for splenomegaly  Defer work-up to GI team      Electrolyte imbalances-monitor and replete  k/phos  HLD-on statins  Previous DVT-holding  anticoagulation, hematology on case    Mild pancytopenia-from splenomegaly and COL,monitor CBC,  hematology  on case    Chronic A-fib-continue Amio, holding anticoagulation as high risk of bleeding  ?Parkinson's disease-continue Sinemet  Hypotension-  midodrine and monitor BP      Discussion/MDM:- Discussion/MDM: Patient with multiple medical comorbidities, each with high likelihood for morbidity and mortality if left untreated.   I have reviewed patient's presenting subjective and objective findings, as well as all laboratory studies, imaging studies, and vital signs to date as well as treatment rendered and patient's response to those treatments. Pt need IV fluids with additives and Drug therapy requiring intensive monitoring for toxicity. In addition, prior medical, surgical and relevant social and family histories were reviewed. I have discussed management plan with patient/family and with nursing staff.        AD FC  DVT Prx -SCD  NOK -he did not specify  Social Determinants of Health-unknown    Disposition-48H,  SNF    Barriers to discharge-  medical stability,   consultant clearance        This is dictation was done by dragon, computer voice recognition software.  Quite often unanticipated grammatical, syntax, homophones and other interpretive errors or inadvertently transcribed by the computer software.  Please excuse errors that have escaped final proofreading. Please contact me for any questions or details.  Thank you.     Signed By: Valetta Close, MD     April 10, 2022

## 2022-04-11 MED ORDER — FERROUS SULFATE 325 (65 FE) MG PO TABS
325 (65 Fe) MG | Freq: Two times a day (BID) | ORAL | Status: AC
Start: 2022-04-11 — End: 2022-04-12
  Administered 2022-04-11 – 2022-04-12 (×2): 325 mg via ORAL

## 2022-04-11 MED FILL — RANOLAZINE ER 500 MG PO TB12: 500 MG | ORAL | Qty: 1

## 2022-04-11 MED FILL — PHOSPHA 250 NEUTRAL 155-852-130 MG PO TABS: 155-852-130 MG | ORAL | Qty: 2

## 2022-04-11 NOTE — Discharge Instructions (Addendum)
Diet soft mechanical  Activity slowly increase to levels before  Check labs CBC/BMP/mag in 3 to 5 days at SNF  Strict fall/aspiration/pressure ulcer precautions  Stop Eliquis    Follow colon polyp biopsy results at GI office next visit  Avoid any  NSAIDs    Stop asa if hgb drops or GI bleed  Return to emergency or call ambulance immediately if symptoms recur or get worse

## 2022-04-11 NOTE — Plan of Care (Signed)
Problem: Physical Therapy - Adult  Goal: By Discharge: Performs mobility at highest level of function for planned discharge setting.  See evaluation for individualized goals.  Description: FUNCTIONAL STATUS PRIOR TO ADMISSION: The patient  required moderate assistance for basic and instrumental ADLs. and The patient was functional at the wheelchair level and required moderate assistance for transfers to the chair.    HOME SUPPORT PRIOR TO ADMISSION: The patient lived with son and reports requiring increased assistance over the past several weeks    Physical Therapy Goals  Initiated 04/07/2022  Pt stated goal: to get better  Pt will be I with LE HEP in 7 days.  Pt will perform bed mobility with Contact Guard Assist in 7 days.  Pt will perform transfers including stand pivot to chair with Contact Guard Assist in 7 days.   Pt will demonstrate improvement in standing balance from Maximal Assist to Contact Guard Assist in 7 days.      Outcome: Progressing            PHYSICAL THERAPY TREATMENT     Patient: Carlos Petersen (71 y.o. male)  Date: 04/11/2022  Diagnosis: GI bleed [K92.2]  Gastrointestinal hemorrhage, unspecified gastrointestinal hemorrhage type [K92.2]  Anemia, unspecified type [D64.9] GI bleed  Procedure(s) (LRB):  COLONOSCOPY POLYPECTOMY REMOVAL SNARE/STOMA (N/A) 2 Days Post-Op  Precautions:  Fall Risk, Bed Alarm                In place during session: Peripheral IV, External Catheter, and EKG/telemetry   Chart, physical therapy assessment, plan of care and goals were reviewed.    ASSESSMENT  Patient continues with skilled PT services and is progressing towards goals. Pt semi-supine upon PT arrival, agreeable to session.  Co-treat with OT due to level of assist required for mobility and OOB transfers for safety of patient/clinicians. Patient required less assist with bed mobility today so may only need assist x 2 for OOB transfers due to weakness of Les (See below for objective details and assist levels).      Overall pt tolerated session fair today with transfer to EOB performed with minA x 2 with additional time. Patient sat EOB for a few minutes to gather himself and let dizziness subside before attempting to stand. He also performed LE and UE therex while seated EOB and prior to standing. Sit<>stand with RW and modA x 2 with cues for hand placement and sequencing. Patient very unsteady on his feet and buckling of both knees noted but no LOB. He was able to stand approx 1-2 minutes before needing to sit 2/2 fatigue and fear of falling. Patient declined standing again but was able to scoot his bottom along the bed towards [facility] to position to comfort. Patient left in semi-supine with all needs in reach. Will continue to benefit from skilled PT services, and will continue to progress as tolerated.      Start of Session End of Session   SPO2 (%) 97 99   Heart Rate (BPM) 85 85       Current Level of Function Impacting Discharge (mobility/balance): decr strength, decr endurance, decr mobility     Other factors to consider for discharge: poor safety awareness, high risk for falls, not safe to be alone, and concern for safely navigating or managing the home environment       PLAN :  Patient continues to benefit from skilled intervention to address impaired functional mobility, decreased ROM, impaired strength, decreased activity tolerance, poor safety awareness, impaired  balance, and impaired posture. Continue treatment per established plan of care to address goals.    Recommend with staff: Out of bed to chair for meals and Encourage HEP in prep for ADLs/mobility    Recommendation for discharge: (in order for the patient to meet his/her long term goals)  Skilled Nursing Facility    IF patient discharges home will need the following XNA:TFTD belt and rolling walker       SUBJECTIVE:   Patient stated "I don't know if I am going to be able to stand."    OBJECTIVE DATA SUMMARY:   Critical Behavior:  Orientation  Orientation  Level: Oriented X4  Cognition  Overall Cognitive Status: WNL    Functional Mobility Training:  Bed Mobility:  Bed Mobility Training  Bed Mobility Training: Yes  Interventions: Tactile cues;Verbal cues  Rolling: Setup  Supine to Sit: Minimum assistance;Assist X2;Additional time  Sit to Supine: Contact-guard assistance  Scooting: Contact-guard assistance;Additional time  Transfers:  Art therapist: Yes  Overall Level of Assistance: Moderate assistance;Assist X2;Additional time  Sit to Stand: Moderate assistance;Assist X2;Additional time  Stand to Sit: Moderate assistance;Assist X2;Additional time  Balance:  Balance  Sitting: Intact  Sitting - Static: Good (unsupported)  Sitting - Dynamic: Good (unsupported)  Standing: Impaired  Standing - Static: Fair;Constant support  Standing - Dynamic: Poor;Constant support (both knees wobbly)  Wheelchair Mobility:  Wheelchair Management  Wheelchair Management: No  Ambulation/Gait Training:     Location manager Device: Gait belt;Walker, rolling      Therapeutic Exercises:   Performed with patient seated EOB    EXERCISE   Sets   Reps   Active Active Assist   Passive Self ROM   Comments   Ankle Pumps   []  []  []  []     Quad Sets/Glut Sets   []  []  []  []     Hamstring Sets   []  []  []  []     Short Arc Quads   []  []  []  []     Heel Slides   []  []  []  []     Straight Leg Raises   []  []  []  []     Hip abd/add   []  []  []  []     Long Arc Quads 1 15 [x]  []  []  []     Marching 1 15 [x]  []  []  []        []  []  []  []        Pain Rating:  0/10     Activity Tolerance:   Fair , requires frequent rest breaks, observed shortness of breath on exertion, and SpO2 stable on room air    After treatment patient left in no apparent distress:   Bed locked and returned to lowest position, Patient left in no apparent distress in bed, Call bell within reach, and Bed/ chair alarm activated, and nsg updated     COMMUNICATION/COLLABORATION:   The patient's plan of care was discussed with: Physical therapist,  Occupational therapy assistant, and Registered nurse    Patient Education  Education Given To: Patient  Education Provided: Role of Therapy;Plan of Care;Home Exercise Program;Transfer Training  Education Method: Demonstration;Verbal;Teach Back  Education Outcome: Verbalized understanding;Demonstrated understanding;Continued education needed      , PTA  Minutes: 25

## 2022-04-11 NOTE — Progress Notes (Addendum)
OCCUPATIONAL THERAPY TREATMENT  Patient: Carlos Petersen (71 y.o. male)  Date: 04/11/2022  Primary Diagnosis: GI bleed [K92.2]  Gastrointestinal hemorrhage, unspecified gastrointestinal hemorrhage type [K92.2]  Anemia, unspecified type [D64.9]  Procedure(s) (LRB):  COLONOSCOPY POLYPECTOMY REMOVAL SNARE/STOMA (N/A) 2 Days Post-Op   Precautions: Fall Risk, Bed Alarm                In place during session: Peripheral IV, External Catheter, and EKG/telemetry   Chart, occupational therapy assessment, plan of care, and goals were reviewed.    ASSESSMENT  Patient continues with skilled OT services and is progressing towards goals.  Pt received semi-supine in bed upon arrival, AXO x 4 and agreeable to COTA/PTA tx at this time. Pt cooperative and demonstrated fair effort during activities. Pt tolerated therapy session fairly with completion of bed mobility, ADL'S, functional tf's and HEP. Noted improvement with bed mobility<>EOB using bed rail and vc's for technique with additional time with much encouragement. Pt presented improved sitting balance while performing simple grooming EOB, however unable to don bil socks d/t decreased reach/fear of falling forward. Pt engaged in bil UE exercises with education and cueing provided regarding joint protection/proper technique/pacing self/achieve full ROM, see grid below. Pt limited AROM with R UE and req'd assist with chest press. Pt completed x2 STS with mod A x2 with vc's for hand placement/correct posture, standing briefly d/t decreased  bil LE function. EOB, pt able to squat scoot towards HOB with CGA with additional time. Overall, pt continues to present with deficits in decreased functional mobility, independence in ADLs, high-level IADLs, strength, body mechanics, activity tolerance, endurance, balance, posture and decreased bil LE function (see below for objective details and assist levels).  Will continue to progress. Recommend d/c to SNF once medically appropriate.         Start of session End of session    Heart Rate (BPM) 85 85    SPO2 (%) 97 99               Other factors to consider for discharge:   Time of onset, medical prognosis/diagnosis, severity of deficits, PLOF, functional baseline, home environment, and family support      PLAN :  Patient continues to benefit from skilled intervention to address the above impairments.  Continue treatment per established plan of care to address goals.    Recommend with staff: Encourage HEP in prep for ADLs/mobility and Frequent repositioning to prevent skin breakdown    Recommend next OT session: UB dressing and Seated grooming    Recommendation for discharge: (in order for the patient to meet his/her long term goals): Skilled Nursing Facility    IF patient discharges home will need the following DME: TBD       SUBJECTIVE:   Patient stated "I'll fall forward."    OBJECTIVE DATA SUMMARY:   Cognitive/Behavioral Status:  Orientation  Orientation Level: Oriented X4  Cognition  Overall Cognitive Status: WNL    Functional Mobility and Transfers for ADLs:  Bed Mobility:  Bed Mobility Training  Bed Mobility Training: Yes  Interventions: Tactile cues;Verbal cues  Rolling: Setup  Supine to Sit: Minimum assistance;Assist X2;Additional time  Sit to Supine: Contact-guard assistance  Scooting: Contact-guard assistance;Additional time    Transfers:  Art therapist: Yes  Overall Level of Assistance: Moderate assistance;Assist X2;Additional time  Sit to Stand: Moderate assistance;Assist X2;Additional time  Stand to Sit: Moderate assistance;Assist X2;Additional time      Balance:  Balance  Sitting: Intact  Sitting - Static: Good (unsupported)  Sitting - Dynamic: Good (unsupported)  Standing: Impaired  Standing - Static: Fair;Constant support  Standing - Dynamic: Poor;Constant support (both knees wobbly)      ADL Intervention:       Grooming: Stand by assistance;Setup   Grooming Skilled Clinical Factors: for facial hygiene at EOB           LE Dressing: Dependent/Total  LE Dressing Skilled Clinical Factors: EOB, bil socks         Therapeutic exercise:    Exercise Sets Reps AROM AAROM PROM Self PROM Comments   Elbow E/F 1 12 [x]  []  []  []  EOB   Chest press 1 12 []  [x] Assist with R UE  []     Scapular retraction 1 10 [x]             Pain Rating:  0/10  Pain Intervention(s):       Activity Tolerance:   Fair  and requires rest breaks    After treatment patient left in no apparent distress:   Bed locked and returned to lowest position, Patient left in no apparent distress in bed, Call bell within reach, Bed/ chair alarm activated, and Side rails x3, and nsg updated     COMMUNICATION/EDUCATION:   The patient's plan of care was discussed with: Physical therapy assistant and Registered nurse Co-tx with PTA for increased pt/clinician safety with OOB activity         Patient Education  Education Given To: Patient  Education Provided: Role of Therapy;Home Exercise Program;Transfer Training;Energy Conservation  Education Method: Verbal;Demonstration  Education Outcome: Verbalized understanding;Demonstrated understanding    Thank you for this referral.  , OTA  Minutes: 25   Problem: Occupational Therapy - Adult  Goal: By Discharge: Performs self-care activities at highest level of function for planned discharge setting.  See evaluation for individualized goals.  Description: FUNCTIONAL STATUS PRIOR TO ADMISSION:  Pt reports he has progressively gotten weaker over time; he sometimes is able to transfer to power [facility] on his own but mostly his son A. He req's A for ADLs.     HOME SUPPORT: Pt lives w/ son; son is not home 24/7.     Occupational Therapy Goals:  Initiated 04/07/2022  Patient/Family stated goal: I want to get better  Patient will perform grooming in sitting with Set-up within 7 day(s).  Patient will perform UB bathing with Contact Guard Assist within 7 day(s).  Patient will perform LB bathing with Minimal Assist within 7 day(s).  Patient will  perform toilet transfers with Minimal Assist  within 7 day(s).  Patient will perform all aspects of toileting with Minimal Assist within 7 day(s).  Patient will participate in upper extremity therapeutic exercise/activities with Independence within 7 day(s).    Outcome: Progressing

## 2022-04-11 NOTE — Plan of Care (Signed)
Problem: Discharge Planning  Goal: Discharge to home or other facility with appropriate resources  Outcome: Progressing     Problem: Skin/Tissue Integrity  Goal: Absence of new skin breakdown  Description: 1.  Monitor for areas of redness and/or skin breakdown  2.  Assess vascular access sites hourly  3.  Every 4-6 hours minimum:  Change oxygen saturation probe site  4.  Every 4-6 hours:  If on nasal continuous positive airway pressure, respiratory therapy assess nares and determine need for appliance change or resting period.  04/11/2022 0040 by Buzzy Han, RN  Outcome: Progressing  04/10/2022 1217 by Wendall Mola, RN  Outcome: Progressing     Problem: ABCDS Injury Assessment  Goal: Absence of physical injury  04/11/2022 0040 by Buzzy Han, RN  Outcome: Progressing  04/10/2022 1217 by Wendall Mola, RN  Outcome: Progressing     Problem: Safety - Adult  Goal: Free from fall injury  04/11/2022 0040 by Buzzy Han, RN  Outcome: Progressing  04/10/2022 1217 by Wendall Mola, RN  Outcome: Progressing     Problem: Chronic Conditions and Co-morbidities  Goal: Patient's chronic conditions and co-morbidity symptoms are monitored and maintained or improved  04/11/2022 0040 by Buzzy Han, RN  Outcome: Progressing  04/10/2022 1217 by Wendall Mola, RN  Outcome: Progressing  Flowsheets (Taken 04/10/2022 0900)  Care Plan - Patient's Chronic Conditions and Co-Morbidity Symptoms are Monitored and Maintained or Improved: Monitor and assess patient's chronic conditions and comorbid symptoms for stability, deterioration, or improvement

## 2022-04-11 NOTE — Plan of Care (Signed)
Problem: Skin/Tissue Integrity  Goal: Absence of new skin breakdown  Description: 1.  Monitor for areas of redness and/or skin breakdown  2.  Assess vascular access sites hourly  3.  Every 4-6 hours minimum:  Change oxygen saturation probe site  4.  Every 4-6 hours:  If on nasal continuous positive airway pressure, respiratory therapy assess nares and determine need for appliance change or resting period.  04/11/2022 1044 by Wendall Mola, RN  Outcome: Progressing  04/11/2022 0040 by Buzzy Han, RN  Outcome: Progressing     Problem: ABCDS Injury Assessment  Goal: Absence of physical injury  04/11/2022 1044 by Wendall Mola, RN  Outcome: Progressing  04/11/2022 0040 by Buzzy Han, RN  Outcome: Progressing     Problem: Safety - Adult  Goal: Free from fall injury  04/11/2022 1044 by Wendall Mola, RN  Outcome: Progressing  04/11/2022 0040 by Buzzy Han, RN  Outcome: Progressing     Problem: Chronic Conditions and Co-morbidities  Goal: Patient's chronic conditions and co-morbidity symptoms are monitored and maintained or improved  04/11/2022 1044 by Wendall Mola, RN  Outcome: Progressing  Flowsheets (Taken 04/11/2022 0820)  Care Plan - Patient's Chronic Conditions and Co-Morbidity Symptoms are Monitored and Maintained or Improved: Monitor and assess patient's chronic conditions and comorbid symptoms for stability, deterioration, or improvement  04/11/2022 0040 by Buzzy Han, RN  Outcome: Progressing

## 2022-04-11 NOTE — Progress Notes (Signed)
Hospitalist Progress Note    NAME:   Carlos Petersen   DOB: [birth year]   MRN: [id number]     Subjective:   Daily Progress Note: 04/11/2022     Hospital course to date/HPI from H&P:  Carlos Petersen is a 71 y.o.  male with PMHx significant multiple comorbidities presents [facility] with complaints of weakness/dizziness.  Patient states over the past few days has been having gradual onset persistent progressive generalized weakness as well as dizziness resulting in a limitation in ambulation following which patient presented to the ED.  Of note patient is on blood thinners for blood clots.  Patient denies any fevers chills nausea vomiting orthopnea paroxysmal nocturnal dyspnea chest pain palpitations headache focal weakness loss sensation auditory or visual symptoms abdominal or urinary complaints or any other associated symptoms.  Patient endorses no recent sick contacts or travel activity  We were asked to admit for work up and evaluation of the above problems.     Chief complaint: Weakness and dizziness  04/06/2022-patient seen and examined, chart was reviewed.  Patient denied any pain, nausea, vomiting.  No other acute issues reported to me by staff at this time.    04/07/22-patient seen, chart was reviewed.  Patient for EGD/colonoscopy today per GI notes.  No bleeding issues reported by patient.  No other acute issues reported to me by staff.  BP soft but asymptomatic.  Therapy recommending SNF.    04/08/22-patient seen and examined, chart was reviewed.  Patient had EGD yesterday for colonoscopy today.  No bleeding issues reported to me by staff.    04/09/22-patient seen, chart was reviewed.  Patient could not get colonoscopy yesterday.  Colonoscopy planned again today.  No other acute issues reported to me by staff.    04/10/22-patient seen, chart was reviewed.  Patient finally had colonoscopy yesterday.  No other acute issues reported to me at this time by patient or staff.    04/11/22-seen  and examined, chart was reviewed.  Case discussed with nursing staff.  No bleeding issues.  Spoke with son in detail on phone who claims patient he is DNR and he prefers patient not be placed back on Eliquis.  I called Dr. Newt Lukes from hematology left voicemail message regarding anticoagulation needs.  Son is aware that he could have increased risk of DVTs and strokes if off Eliquis, he however does not want patient to have future GI bleeds and wants him off Eliquis at discharge.  No other acute issues reported to me by staff at this time.    Current Facility-Administered Medications   Medication Dose Route Frequency    phosphorus (K PHOS NEUTRAL) 2 tablet  500 mg Oral 4x Daily    midodrine (PROAMATINE) tablet 10 mg  10 mg Oral TID WC    0.9 % sodium chloride infusion   IntraVENous PRN    midodrine (PROAMATINE) tablet 5 mg  5 mg Oral Q6H PRN    dextrose 5 % and 0.45 % NaCl with KCl 20 mEq infusion   IntraVENous Continuous    0.9 % sodium chloride infusion   IntraVENous PRN    0.9 % sodium chloride infusion   IntraVENous PRN    albuterol sulfate HFA (PROVENTIL;VENTOLIN;PROAIR) 108 (90 Base) MCG/ACT inhaler 2 puff  2 puff Inhalation Q6H PRN    amiodarone (CORDARONE) tablet 200 mg  200 mg Oral Daily    carbidopa-levodopa (SINEMET CR) 25-100 MG per extended release tablet 4 tablet  4 tablet Oral TID  ferrous sulfate (IRON 325) tablet 325 mg  325 mg Oral Daily with breakfast    gabapentin (NEURONTIN) capsule 100 mg  100 mg Oral BID    ranolazine (RANEXA) extended release tablet 500 mg  500 mg Oral BID    rosuvastatin (CRESTOR) tablet 20 mg  20 mg Oral Nightly    [Held by provider] VORTIoxetine HBr (TRINTELLIX) tablet 20 mg  20 mg Oral Daily    sodium chloride flush 0.9 % injection 5-40 mL  5-40 mL IntraVENous 2 times per day    sodium chloride flush 0.9 % injection 5-40 mL  5-40 mL IntraVENous PRN    0.9 % sodium chloride infusion   IntraVENous PRN    ondansetron (ZOFRAN-ODT) disintegrating tablet 4 mg  4 mg Oral Q8H  PRN    Or    ondansetron (ZOFRAN) injection 4 mg  4 mg IntraVENous Q6H PRN    polyethylene glycol (GLYCOLAX) packet 17 g  17 g Oral Daily PRN    acetaminophen (TYLENOL) tablet 650 mg  650 mg Oral Q6H PRN    Or    acetaminophen (TYLENOL) suppository 650 mg  650 mg Rectal Q6H PRN    budesonide-formoterol (SYMBICORT) 160-4.5 MCG/ACT inhaler 2 puff  2 puff Inhalation BID RT    And    tiotropium (SPIRIVA RESPIMAT) 2.5 MCG/ACT inhaler 2 puff  2 puff Inhalation Daily RT          Objective:     BP 124/69   Pulse 80   Temp 98.1 F (36.7 C) (Oral)   Resp 16   Ht 1.803 m (5\' 11" )   Wt 67 kg (147 lb 11.3 oz)   SpO2 98%   BMI 20.60 kg/m         Temp (24hrs), Avg:97.9 F (36.6 C), Min:97.5 F (36.4 C), Max:98.2 F (36.8 C)        PHYSICAL EXAM:  Gen thin built  Neck Supple  CVS RRR  Resp Symmetric expansion  Abdomen soft, NT/ND  Ext moves all  Neuro Alert slow speech  Psych flat affect  Skin no visible Rash        Data Review:    Recent Labs     04/10/22  0630   NA 139   K 3.6   CL 109*   CO2 26   GLUCOSE 104*   BUN 6   CREATININE 0.51*   CALCIUM 7.1*   MG 2.0   PHOS 2.4*   LABALBU 1.7*         No results found for this or any previous visit (from the past 18 hour(s)).    No results found for this or any previous visit.     Results       ** No results found for the last 336 hours. **             CT ABDOMEN PELVIS W IV CONTRAST Result Date: 04/05/2022    Cirrhotic change with splenomegaly. Postcholecystectomy. Incidental and/or nonemergent findings are as described above.       EGD- Impression:  Abnormal esophageal motility.  Portal hypertensive gastropathy.  Normal examined duodenum.  No specimens collected.  Recommendation:  Continue present medications.      Colonosocpy 04/09/22- Impression:         -  Perianal skin tags found on perianal exam.         -  Many 5 to 13 mm polyps in the ascending colon and in the transverse colon,  removed with a cold biopsy forceps.  Resected and retrieved.         -  Moderate  diverticulosis in the descending colon and in the sigmoid colon.            Peri-diverticular erythema was seen.         -  Internal hemorrhoids.  Recommendation:         -  Await pathology results.         -  Resume regular diet and high fiber diet.         -  Continue present medications.         -  Repeat colonoscopy in 3 years for surveillance.    Assessment/Plan:     Suspected GI bleed with acute blood loss anemia-  S/p PRBC  Monitor H&H  IV PPI  Egd 8/9- as above  Colo 8/11-  as above  Follow GI recommendations  High fiber diet  Transfuse if below 7  Cont oral iron    Cirrhotic liver with splenomegaly-  Monitor INR and ammonia periodically  LFTs noted  GI on case follow as OP for further w/u  Hematology on case  for splenomegaly fu as OP      Electrolyte imbalances-monitor and replete  k/phos  HLD-on statins  Previous DVT-holding anticoagulation, hematology on case    Mild pancytopenia-from splenomegaly and COL,monitor CBC periodically at SNF, hematology  on case fu as OP    Chronic A-fib-continue Amio, holding anticoagulation as high risk of bleeding  ?Parkinson's disease-continue Sinemet  Hypotension-  midodrine and BP  stable    Discussion/MDM:- Discussion/MDM: Patient with multiple medical comorbidities, each with high likelihood for morbidity and mortality if left untreated.   I have reviewed patient's presenting subjective and objective findings, as well as all laboratory studies, imaging studies, and vital signs to date as well as treatment rendered and patient's response to those treatments. Pt need IV fluids with additives and Drug therapy requiring intensive monitoring for toxicity. In addition, prior medical, surgical and relevant social and family histories were reviewed. I have discussed management plan with patient/family and with nursing staff.        AD DNR   DVT Prx -SCD  NOK - son [phone] called 04/11/22 updated  clinical status answered all questions to his best satisfaction   Social  Determinants of Health-unknown    Disposition-24H,  SNF    Barriers to discharge-  medical stability,   consultant clearance        This is dictation was done by dragon, computer voice recognition software.  Quite often unanticipated grammatical, syntax, homophones and other interpretive errors or inadvertently transcribed by the computer software.  Please excuse errors that have escaped final proofreading. Please contact me for any questions or details.  Thank you.     Signed By: Valetta Close, MD     April 11, 2022

## 2022-04-11 NOTE — Progress Notes (Signed)
Hematology and Oncology Progress Note    Patient: Carlos Petersen MRN: [id number]  SSN: [id number]    Date of Birth: [birth year]  Age: 71 y.o.  Sex: male      Admit Date: 04/05/2022    LOS: 6 days     Chief Complaint: Patient is having diarrhea    Subjective:   Patient is feeling tired and he has diarrhea.  Today he does not have any blood in stool.  Patient is feeling exhausted.  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  He also has a history of atrial fibrillation.  His appetite is decreased.    Review of system: No mouth sores or dysphagia.  He has lost some weight.      Objective:     Vitals:    04/10/22 1937 04/10/22 2050 04/11/22 0121 04/11/22 0824   BP:  (!) 109/54 (!) 105/52 124/69   Pulse:  74 72 80   Resp:  18 17 16    Temp:  98.2 F (36.8 C) 97.5 F (36.4 C) 98.1 F (36.7 C)   TempSrc:  Oral Oral Oral   SpO2: 97% 99% 98% 98%   Weight:       Height:            Physical Exam:  Constitutional: Elderly white male looks chronically ill.  Not in any acute distress or pain.  Eyes: Sclerae anicteric.  Conjunctivae is pale.  Patient is bitemporal wasting.  ENMT: Oral mucosa is moist.  Neck: No adenopathy.   Respiratory: Lungs are clear bilaterally.  Cardiovascular: Normal sinus rhythm.  Patient has defibrillator on left side chest wall.  Abdomen: Soft, nontender, no hepatosplenomegaly. No guarding or rigidity. Bowel sounds present.  Extremities: No edema.  Skin: No petechiae; no skin rash.  Neurologic: Alert/oriented x 3    Lab/Data Review:    No results found for this or any previous visit (from the past 24 hour(s)).          Assessment and plan:     71 year old white male with history of multiple medical problems who came with increasing shortness of breath, generalized weakness and was found to have pancytopenia and severe anemia.     1) anemia: Patient came with severe anemia.  Patient has no obvious bleeding.  -His peripheral smear on 8 August shows microcytosis, anisocytosis, decrease in platelet  numbers, frequent large platelets, decrease in white cells and slight increase in monocytes.  No blast cells were seen.  No schistocytes or spherocytes were seen..     -Based on this clinical picture and peripheral smear it looks like patient may have pancytopenia from possible hypersplenism from cirrhosis of the liver.  His CT of abdomen shows splenomegaly and cirrhotic changes.  -Patient had endoscopy as an result were reviewed.    8/12: Patient's hemoglobin is 8.3 and hematocrit 26.3 which is much better than yesterday.  Recent events noted.  We will continue to follow his CBC closely.  We will check pathology report results when it is available.    8/13: CBC results is not available today.  Discussed with attending physician Dr. 9/13 about anticoagulation.  There was a question about whether to continue anticoagulation or not.  It looks like patient had DVT in 2017 and at least as far as I know there was no recurrence of DVT or thrombosis.  Currently patient is at high risk of recurrent GI bleeding because of his cirrhosis and thrombocytopenia.  Patient has atrial fibrillation  but at this point his risk of bleeding from Eliquis may be much higher so I would recommend considering other options like aspirin.  I have discussed above with patient.  Patient's overall prognosis is guarded.      This dictation was done by dragon, Advertising account planner.  Often unanticipated grammatical, syntax, phones and other interpretive errors are inadvertently transcribed.  Please excuse errors that have escaped final proofreading.       Signed By: Grayland Jack, MD     April 11, 2022

## 2022-04-12 LAB — CBC WITH AUTO DIFFERENTIAL
Absolute Immature Granulocyte: 0 10*3/uL (ref 0.00–0.04)
Basophils %: 0 % (ref 0–1)
Basophils Absolute: 0 10*3/uL (ref 0.0–0.1)
Eosinophils %: 1 % (ref 0–7)
Eosinophils Absolute: 0 10*3/uL (ref 0.0–0.4)
Hematocrit: 24.4 % — ABNORMAL LOW (ref 36.6–50.3)
Hemoglobin: 7.7 g/dL — ABNORMAL LOW (ref 12.1–17.0)
Immature Granulocytes: 1 % — ABNORMAL HIGH (ref 0–0.5)
Lymphocytes %: 27 % (ref 12–49)
Lymphocytes Absolute: 0.8 10*3/uL (ref 0.8–3.5)
MCH: 24.8 PG — ABNORMAL LOW (ref 26.0–34.0)
MCHC: 31.6 g/dL (ref 30.0–36.5)
MCV: 78.7 FL — ABNORMAL LOW (ref 80.0–99.0)
MPV: 9.7 FL (ref 8.9–12.9)
Monocytes %: 12 % (ref 5–13)
Monocytes Absolute: 0.4 10*3/uL (ref 0.0–1.0)
Neutrophils %: 59 % (ref 32–75)
Neutrophils Absolute: 1.7 10*3/uL — ABNORMAL LOW (ref 1.8–8.0)
Nucleated RBCs: 0 PER 100 WBC
Platelets: 84 10*3/uL — ABNORMAL LOW (ref 150–400)
RBC: 3.1 M/uL — ABNORMAL LOW (ref 4.10–5.70)
RDW: 19.8 % — ABNORMAL HIGH (ref 11.5–14.5)
WBC: 2.9 10*3/uL — ABNORMAL LOW (ref 4.1–11.1)
nRBC: 0 10*3/uL (ref 0.00–0.01)

## 2022-04-12 LAB — AMMONIA: Ammonia: 27 umol/L (ref <32)

## 2022-04-12 LAB — RENAL FUNCTION PANEL
Albumin: 1.6 g/dL — ABNORMAL LOW (ref 3.5–5.0)
Anion Gap: 6 mmol/L (ref 5–15)
BUN: 7 mg/dL (ref 6–20)
Bun/Cre Ratio: 12 (ref 12–20)
CO2: 24 mmol/L (ref 21–32)
Calcium: 7.2 mg/dL — ABNORMAL LOW (ref 8.5–10.1)
Chloride: 108 mmol/L (ref 97–108)
Creatinine: 0.58 mg/dL — ABNORMAL LOW (ref 0.70–1.30)
Est, Glom Filt Rate: >60 mL/min/{1.73_m2} (ref >60)
Glucose: 86 mg/dL (ref 65–100)
Phosphorus: 3 mg/dL (ref 2.6–4.7)
Potassium: 3 mmol/L — ABNORMAL LOW (ref 3.5–5.1)
Sodium: 138 mmol/L (ref 136–145)

## 2022-04-12 LAB — PROTIME-INR
INR: 1.2 — ABNORMAL HIGH (ref 0.9–1.1)
Protime: 16 s — ABNORMAL HIGH (ref 11.9–14.6)

## 2022-04-12 LAB — MAGNESIUM: Magnesium: 1.8 mg/dL (ref 1.6–2.4)

## 2022-04-12 MED ORDER — MIDODRINE HCL 10 MG PO TABS
10 MG | ORAL_TABLET | Freq: Three times a day (TID) | ORAL | 0 refills | Status: AC
Start: 2022-04-12 — End: 2022-05-12

## 2022-04-12 MED ORDER — K PHOS MONO-SOD PHOS DI & MONO 155-852-130 MG PO TABS
155-852-130 MG | ORAL_TABLET | Freq: Four times a day (QID) | ORAL | 0 refills | Status: AC
Start: 2022-04-12 — End: 2022-04-17

## 2022-04-12 MED ORDER — ASPIRIN 81 MG PO TBEC
81 MG | Freq: Every day | ORAL | Status: DC
Start: 2022-04-12 — End: 2022-04-12
  Administered 2022-04-12: 18:00:00 81 mg via ORAL

## 2022-04-12 MED ORDER — POTASSIUM CHLORIDE CRYS ER 20 MEQ PO TBCR
20 MEQ | Freq: Once | ORAL | Status: AC
Start: 2022-04-12 — End: 2022-04-12
  Administered 2022-04-12: 18:00:00 40 meq via ORAL

## 2022-04-12 MED ORDER — ASPIRIN 81 MG PO TBEC
81 MG | ORAL_TABLET | Freq: Every day | ORAL | 3 refills | Status: DC
Start: 2022-04-12 — End: 2022-05-11

## 2022-04-12 MED FILL — PHOSPHA 250 NEUTRAL 155-852-130 MG PO TABS: 155-852-130 MG | ORAL | Qty: 2

## 2022-04-12 MED FILL — RANOLAZINE ER 500 MG PO TB12: 500 MG | ORAL | Qty: 1

## 2022-04-12 NOTE — Progress Notes (Signed)
Hospitalist Progress Note    NAME:   Carlos Petersen   DOB: 15-Feb-1951   MRN: [id number]     Subjective:   Daily Progress Note: 04/12/2022     Hospital course to date/HPI from H&P:  Carlos Petersen is a 71 y.o.  male with PMHx significant multiple comorbidities presents [facility] with complaints of weakness/dizziness.  Patient states over the past few days has been having gradual onset persistent progressive generalized weakness as well as dizziness resulting in a limitation in ambulation following which patient presented to the ED.  Of note patient is on blood thinners for blood clots.  Patient denies any fevers chills nausea vomiting orthopnea paroxysmal nocturnal dyspnea chest pain palpitations headache focal weakness loss sensation auditory or visual symptoms abdominal or urinary complaints or any other associated symptoms.  Patient endorses no recent sick contacts or travel activity  We were asked to admit for work up and evaluation of the above problems.     Chief complaint: Weakness and dizziness  04/06/2022-patient seen and examined, chart was reviewed.  Patient denied any pain, nausea, vomiting.  No other acute issues reported to me by staff at this time.    04/07/22-patient seen, chart was reviewed.  Patient for EGD/colonoscopy today per GI notes.  No bleeding issues reported by patient.  No other acute issues reported to me by staff.  BP soft but asymptomatic.  Therapy recommending SNF.    04/08/22-patient seen and examined, chart was reviewed.  Patient had EGD yesterday for colonoscopy today.  No bleeding issues reported to me by staff.    04/09/22-patient seen, chart was reviewed.  Patient could not get colonoscopy yesterday.  Colonoscopy planned again today.  No other acute issues reported to me by staff.    04/10/22-patient seen, chart was reviewed.  Patient finally had colonoscopy yesterday.  No other acute issues reported to me at this time by patient or staff.    04/11/22-seen  and examined, chart was reviewed.  Case discussed with nursing staff.  No bleeding issues.  Spoke with son in detail on phone who claims patient he is DNR and he prefers patient not be placed back on Eliquis.  I called Dr. Newt Lukes from hematology left voicemail message regarding anticoagulation needs.  Son is aware that he could have increased risk of DVTs and strokes if off Eliquis, he however does not want patient to have future GI bleeds and wants him off Eliquis at discharge.  No other acute issues reported to me by staff at this time.    04/12/22-patient seen and examined no acute complaints.  Agree with hematology will start low-dose aspirin and monitor in skilled nursing facility.  If bleeding reappears may need to stop aspirin.  Patient poor candidate for Eliquis at this point.  Patient stable for discharge to skilled nursing facility if bed available today.    Current Facility-Administered Medications   Medication Dose Route Frequency    aspirin EC tablet 81 mg  81 mg Oral Daily    potassium chloride (KLOR-CON M) extended release tablet 40 mEq  40 mEq Oral Once    ferrous sulfate (IRON 325) tablet 325 mg  325 mg Oral BID WC    phosphorus (K PHOS NEUTRAL) 2 tablet  500 mg Oral 4x Daily    midodrine (PROAMATINE) tablet 10 mg  10 mg Oral TID WC    0.9 % sodium chloride infusion   IntraVENous PRN    midodrine (PROAMATINE) tablet 5 mg  5 mg Oral Q6H PRN    0.9 % sodium chloride infusion   IntraVENous PRN    0.9 % sodium chloride infusion   IntraVENous PRN    albuterol sulfate HFA (PROVENTIL;VENTOLIN;PROAIR) 108 (90 Base) MCG/ACT inhaler 2 puff  2 puff Inhalation Q6H PRN    amiodarone (CORDARONE) tablet 200 mg  200 mg Oral Daily    carbidopa-levodopa (SINEMET CR) 25-100 MG per extended release tablet 4 tablet  4 tablet Oral TID    gabapentin (NEURONTIN) capsule 100 mg  100 mg Oral BID    ranolazine (RANEXA) extended release tablet 500 mg  500 mg Oral BID    rosuvastatin (CRESTOR) tablet 20 mg  20 mg Oral Nightly     [Held by provider] VORTIoxetine HBr (TRINTELLIX) tablet 20 mg  20 mg Oral Daily    sodium chloride flush 0.9 % injection 5-40 mL  5-40 mL IntraVENous 2 times per day    sodium chloride flush 0.9 % injection 5-40 mL  5-40 mL IntraVENous PRN    0.9 % sodium chloride infusion   IntraVENous PRN    ondansetron (ZOFRAN-ODT) disintegrating tablet 4 mg  4 mg Oral Q8H PRN    Or    ondansetron (ZOFRAN) injection 4 mg  4 mg IntraVENous Q6H PRN    polyethylene glycol (GLYCOLAX) packet 17 g  17 g Oral Daily PRN    acetaminophen (TYLENOL) tablet 650 mg  650 mg Oral Q6H PRN    Or    acetaminophen (TYLENOL) suppository 650 mg  650 mg Rectal Q6H PRN    budesonide-formoterol (SYMBICORT) 160-4.5 MCG/ACT inhaler 2 puff  2 puff Inhalation BID RT    And    tiotropium (SPIRIVA RESPIMAT) 2.5 MCG/ACT inhaler 2 puff  2 puff Inhalation Daily RT          Objective:     BP (!) 109/53   Pulse 71   Temp 97.7 F (36.5 C) (Oral)   Resp 16   Ht 1.803 m (5\' 11" )   Wt 67 kg (147 lb 11.3 oz)   SpO2 96%   BMI 20.60 kg/m         Temp (24hrs), Avg:97.9 F (36.6 C), Min:97.7 F (36.5 C), Max:98.2 F (36.8 C)        PHYSICAL EXAM:  Gen thin built  Neck Supple  CVS RRR  Resp Symmetric expansion  Abdomen soft, NT/ND  Ext moves all  Neuro Alert slow speech  Psych flat affect  Skin no visible Rash        Data Review:    Recent Labs     04/12/22  0507   NA 138   K 3.0*   CL 108   CO2 24   GLUCOSE 86   BUN 7   CREATININE 0.58*   CALCIUM 7.2*   MG 1.8   PHOS 3.0   LABALBU 1.6*       Recent Results (from the past 18 hour(s))   CBC with Auto Differential    Collection Time: 04/12/22  5:07 AM   Result Value Ref Range    WBC 2.9 (L) 4.1 - 11.1 K/uL    RBC 3.10 (L) 4.10 - 5.70 M/uL    Hemoglobin 7.7 (L) 12.1 - 17.0 g/dL    Hematocrit 04/14/22 (L) 36.6 - 50.3 %    MCV 78.7 (L) 80.0 - 99.0 FL    MCH 24.8 (L) 26.0 - 34.0 PG    MCHC 31.6 30.0 - 36.5 g/dL  RDW 19.8 (H) 11.5 - 14.5 %    Platelets 84 (L) 150 - 400 K/uL    MPV 9.7 8.9 - 12.9 FL    Nucleated RBCs 0.0  0.0 PER 100 WBC    nRBC 0.00 0.00 - 0.01 K/uL    Neutrophils % 59 32 - 75 %    Lymphocytes % 27 12 - 49 %    Monocytes % 12 5 - 13 %    Eosinophils % 1 0 - 7 %    Basophils % 0 0 - 1 %    Immature Granulocytes 1 (H) 0 - 0.5 %    Neutrophils Absolute 1.7 (L) 1.8 - 8.0 K/UL    Lymphocytes Absolute 0.8 0.8 - 3.5 K/UL    Monocytes Absolute 0.4 0.0 - 1.0 K/UL    Eosinophils Absolute 0.0 0.0 - 0.4 K/UL    Basophils Absolute 0.0 0.0 - 0.1 K/UL    Absolute Immature Granulocyte 0.0 0.00 - 0.04 K/UL    Differential Type AUTOMATED     Renal Function Panel    Collection Time: 04/12/22  5:07 AM   Result Value Ref Range    Sodium 138 136 - 145 mmol/L    Potassium 3.0 (L) 3.5 - 5.1 mmol/L    Chloride 108 97 - 108 mmol/L    CO2 24 21 - 32 mmol/L    Anion Gap 6 5 - 15 mmol/L    Glucose 86 65 - 100 mg/dL    BUN 7 6 - 20 mg/dL    Creatinine 2.87 (L) 0.70 - 1.30 mg/dL    Bun/Cre Ratio 12 12 - 20      Est, Glom Filt Rate >60 >60 ml/min/1.52m2    Calcium 7.2 (L) 8.5 - 10.1 mg/dL    Phosphorus 3.0 2.6 - 4.7 mg/dL    Albumin 1.6 (L) 3.5 - 5.0 g/dL   Magnesium    Collection Time: 04/12/22  5:07 AM   Result Value Ref Range    Magnesium 1.8 1.6 - 2.4 mg/dL   Ammonia    Collection Time: 04/12/22  5:07 AM   Result Value Ref Range    Ammonia 27 <32 umol/L   Protime-INR    Collection Time: 04/12/22  5:07 AM   Result Value Ref Range    Protime 16.0 (H) 11.9 - 14.6 sec    INR 1.2 (H) 0.9 - 1.1         No results found for this or any previous visit.     Results       ** No results found for the last 336 hours. **             CT ABDOMEN PELVIS W IV CONTRAST Result Date: 04/05/2022    Cirrhotic change with splenomegaly. Postcholecystectomy. Incidental and/or nonemergent findings are as described above.       EGD- Impression:  Abnormal esophageal motility.  Portal hypertensive gastropathy.  Normal examined duodenum.  No specimens collected.  Recommendation:  Continue present medications.      Colonosocpy 04/09/22- Impression:         -  Perianal skin  tags found on perianal exam.         -  Many 5 to 13 mm polyps in the ascending colon and in the transverse colon,            removed with a cold biopsy forceps.  Resected and retrieved.         -  Moderate diverticulosis  in the descending colon and in the sigmoid colon.            Peri-diverticular erythema was seen.         -  Internal hemorrhoids.  Recommendation:         -  Await pathology results.         -  Resume regular diet and high fiber diet.         -  Continue present medications.         -  Repeat colonoscopy in 3 years for surveillance.    Assessment/Plan:     Suspected GI bleed with acute blood loss anemia-  S/p PRBC  Stable H&H >7  Cont oral PPI  Egd 8/9- as above  Colo 8/11-  as above  Follow GI as OP  High fiber diet  Monitor h/h at SNF  Cont oral iron    Cirrhotic liver with splenomegaly-  Monitor INR and ammonia periodically at SNF  LFTs  ok  GI on case follow as OP for further w/u  Hematology on case  for splenomegaly fu as OP      Electrolyte imbalances-monitor and replete  k repeat BMP at SNF 3 days  HLD-on statins  Previous DVT-holding anticoagulation per  hematology on case as risk of bleeding. Cont asa may need to stop if drop in hgb or rebleedid    Mild pancytopenia-from splenomegaly and COL,monitor CBC periodically at SNF, hematology  on case fu as OP    Chronic A-fib-continue Amio, holding anticoagulation as high risk of bleeding, start asa  ?Parkinson's disease-continue Sinemet  Hypotension-  midodrine and BP  stable    Discussion/MDM:- Discussion/MDM: Patient with multiple medical comorbidities, each with high likelihood for morbidity and mortality if left untreated.   I have reviewed patient's presenting subjective and objective findings, as well as all laboratory studies, imaging studies, and vital signs to date as well as treatment rendered and patient's response to those treatments. Pt need IV fluids with additives and Drug therapy requiring intensive monitoring for toxicity. In  addition, prior medical, surgical and relevant social and family histories were reviewed. I have discussed management plan with patient/family and with nursing staff.        AD DNR   DVT Prx -SCD  NOK - son [phone] called 04/11/22 updated  clinical status answered all questions to his best satisfaction   Social Determinants of Health-unknown    Disposition-  SNF if bed available today    Barriers to discharge-  SNF placement        This is dictation was done by dragon, computer voice recognition software.  Quite often unanticipated grammatical, syntax, homophones and other interpretive errors or inadvertently transcribed by the computer software.  Please excuse errors that have escaped final proofreading. Please contact me for any questions or details.  Thank you.     Signed By: Valetta Close, MD     April 12, 2022

## 2022-04-12 NOTE — Progress Notes (Signed)
Spiritual Care Assessment/Progress Note  [facility]    Name: Carlos Petersen MRN: [id number]    Age: 71 y.o.     Sex: male   Language: English     Date: 04/12/2022            Total Time Calculated: 4 min              Spiritual Assessment begun in [facility]  Service Provided For:: Patient  Referral/Consult From:: Rounding  Encounter Overview/Reason : Initial Encounter    Spiritual beliefs:      []  Involved in a faith tradition/spiritual practice:      []  Supported by a faith community:      []  Claims no spiritual orientation:      []  Seeking spiritual identity:           []  Adheres to an individual form of spirituality:      [x]  Not able to assess:                Identified resources for coping and support system:   Support System: Unknown       []  Prayer                  []  Devotional reading               []  Music                  []  Guided Imagery     []  Pet visits                                        []  Other: (COMMENT)     Specific area/focus of visit   Encounter:    Crisis:    Spiritual/Emotional needs: Type: Spiritual Support  Ritual, Rites and Sacraments:    Grief, Loss, and Adjustments:    Ethics/Mediation:    Behavioral Health:    Palliative Care:    Advance Care Planning:      Plan/Referrals: Other (Comment) (Chaplain is available if needed)    Narrative: Chaplain visited Carlos Petersen for purpose of spiritual assessments while making rounds on 60 . Carlos Petersen reports he is not interested in a visit at this time. Pt wants to sleep.    Chaplain provided supportive presence and informed pt of Chaplain availability.    Please contact Spiritual Care for any emotional/spiritual needs and/or further referrals. Thank you.    Rev. Samari Gorby, MDIV  Chaplain can be reached by calling the operator at [facility]  [phone]

## 2022-04-12 NOTE — Progress Notes (Incomplete)
..  Discharge plan of care/case management plan validated with provider discharge order. Discharge order verified, removed IV line saline lock, due oral medications given, instructed with discharge instructions and medications, helped patient change clothes and put all his stuff in a transparent bag.

## 2022-04-12 NOTE — Plan of Care (Signed)
Problem: Discharge Planning  Goal: Discharge to home or other facility with appropriate resources  Outcome: Progressing     Problem: Skin/Tissue Integrity  Goal: Absence of new skin breakdown  Description: 1.  Monitor for areas of redness and/or skin breakdown  2.  Assess vascular access sites hourly  3.  Every 4-6 hours minimum:  Change oxygen saturation probe site  4.  Every 4-6 hours:  If on nasal continuous positive airway pressure, respiratory therapy assess nares and determine need for appliance change or resting period.  Outcome: Progressing     Problem: ABCDS Injury Assessment  Goal: Absence of physical injury  Outcome: Progressing     Problem: Safety - Adult  Goal: Free from fall injury  Outcome: Progressing     Problem: Chronic Conditions and Co-morbidities  Goal: Patient's chronic conditions and co-morbidity symptoms are monitored and maintained or improved  Outcome: Progressing     Problem: Occupational Therapy - Adult  Goal: By Discharge: Performs self-care activities at highest level of function for planned discharge setting.  See evaluation for individualized goals.  Description: FUNCTIONAL STATUS PRIOR TO ADMISSION:  Pt reports he has progressively gotten weaker over time; he sometimes is able to transfer to power [facility] on his own but mostly his son A. He req's A for ADLs.     HOME SUPPORT: Pt lives w/ son; son is not home 24/7.     Occupational Therapy Goals:  Initiated 04/07/2022  Patient/Family stated goal: I want to get better  Patient will perform grooming in sitting with Set-up within 7 day(s).  Patient will perform UB bathing with Contact Guard Assist within 7 day(s).  Patient will perform LB bathing with Minimal Assist within 7 day(s).  Patient will perform toilet transfers with Minimal Assist  within 7 day(s).  Patient will perform all aspects of toileting with Minimal Assist within 7 day(s).  Patient will participate in upper extremity therapeutic exercise/activities with [place] within 7  day(s).    04/11/2022 1250 by Lowella Dandy, OTA  Outcome: Progressing     Problem: Physical Therapy - Adult  Goal: By Discharge: Performs mobility at highest level of function for planned discharge setting.  See evaluation for individualized goals.  Description: FUNCTIONAL STATUS PRIOR TO ADMISSION: The patient  required moderate assistance for basic and instrumental ADLs. and The patient was functional at the wheelchair level and required moderate assistance for transfers to the chair.    HOME SUPPORT PRIOR TO ADMISSION: The patient lived with son and reports requiring increased assistance over the past several weeks    Physical Therapy Goals  Initiated 04/07/2022  Pt stated goal: to get better  Pt will be I with LE HEP in 7 days.  Pt will perform bed mobility with Contact Guard Assist in 7 days.  Pt will perform transfers including stand pivot to chair with Contact Guard Assist in 7 days.   Pt will demonstrate improvement in standing balance from Maximal Assist to Contact Guard Assist in 7 days.      04/11/2022 1252 by Tanna Furry, PTA  Outcome: Progressing

## 2022-04-12 NOTE — Care Coordination-Inpatient (Signed)
Patient discharging today to [facility], going to room 204. Patient is aware and in agreement with discharge plan. Message left for son to call back.    Nurse to call report at [phone].    Transition of Care Plan:    RUR: 21%  Prior Level of Functioning: assist from family  Disposition: SNF  If SNF or IPR: Date FOC offered: 08/10  Date FOC received:   Accepting facility: [facility]  Date authorization started with reference number:   Date authorization received and expires: 8/14  Follow up appointments:   DME needed:   Trans[facility] at discharge[facility]  IM/IMM Medicare/Tricare letter given: yes 8/14  Is patient a Veteran and connected with VA?   If yes, was [employer] transfer form completed and VA notified?   Caregiver Contact: message left  Discharge Caregiver contacted prior to discharge?   Care Conference needed?   Barriers to discharge:

## 2022-04-12 NOTE — Discharge Summary (Signed)
Hospitalist Discharge Summary     Patient ID:  Carlos Petersen  [id number]  71 y.o.  1950-11-25  04/05/2022    PCP on record: Herma Mering, MD    Admit date: 04/05/2022  Discharge date and time: 04/12/2022    DISCHARGE DIAGNOSIS:  Suspected acute GI bleed with acute blood loss anemia  New diagnosis cirrhosis with splenomegaly  Thrombocytopenia  Electrolyte imbalances  Hold DVT but poor anticoagulation candidate  Previous history of A-fib  Parkinson's disease  Chronic hypertension      CONSULTATIONS:  IP CONSULT TO HOSPITALIST  IP CONSULT TO GI  IP CONSULT TO HEMATOLOGY    Excerpted HPI from H&P of Ahmed Ellie Lunch, MD:    Carlos Petersen is a 71 y.o.  male with PMHx significant multiple comorbidities presents [facility] with complaints of weakness/dizziness.  Patient states over the past few days has been having gradual onset persistent progressive generalized weakness as well as dizziness resulting in a limitation in ambulation following which patient presented to the ED.  Of note patient is on blood thinners for blood clots.  Patient denies any fevers chills nausea vomiting orthopnea paroxysmal nocturnal dyspnea chest pain palpitations headache focal weakness loss sensation auditory or visual symptoms abdominal or urinary complaints or any other associated symptoms.  Patient endorses no recent sick contacts or travel activity  We were asked to admit for work up and evaluation of the above problems.       04/06/2022-patient seen and examined, chart was reviewed.  Patient denied any pain, nausea, vomiting.  No other acute issues reported to me by staff at this time.     04/07/22-patient seen, chart was reviewed.  Patient for EGD/colonoscopy today per GI notes.  No bleeding issues reported by patient.  No other acute issues reported to me by staff.  BP soft but asymptomatic.  Therapy recommending SNF.     04/08/22-patient seen and examined, chart was reviewed.  Patient had  EGD yesterday for colonoscopy today.  No bleeding issues reported to me by staff.     04/09/22-patient seen, chart was reviewed.  Patient could not get colonoscopy yesterday.  Colonoscopy planned again today.  No other acute issues reported to me by staff.     04/10/22-patient seen, chart was reviewed.  Patient finally had colonoscopy yesterday.  No other acute issues reported to me at this time by patient or staff.     04/11/22-seen and examined, chart was reviewed.  Case discussed with nursing staff.  No bleeding issues.  Spoke with son in detail on phone who claims patient he is DNR and he prefers patient not be placed back on Eliquis.  I called Dr. Newt Lukes from hematology left voicemail message regarding anticoagulation needs.  Son is aware that he could have increased risk of DVTs and strokes if off Eliquis, he however does not want patient to have future GI bleeds and wants him off Eliquis at discharge.  No other acute issues reported to me by staff at this time.     04/12/22-patient seen and examined no acute complaints.  Agree with hematology will start low-dose aspirin and monitor in skilled nursing facility.  If bleeding reappears may need to stop aspirin.  Patient poor candidate for Eliquis at this point.  Patient stable for discharge to skilled nursing facility if bed available today.         LABS:  Recent Results (from the past 72 hour(s))   CBC with Auto Differential  Collection Time: 04/10/22  6:30 AM   Result Value Ref Range    WBC 3.5 (L) 4.1 - 11.1 K/uL    RBC 3.29 (L) 4.10 - 5.70 M/uL    Hemoglobin 8.3 (L) 12.1 - 17.0 g/dL    Hematocrit 93.9 (L) 36.6 - 50.3 %    MCV 79.9 (L) 80.0 - 99.0 FL    MCH 25.2 (L) 26.0 - 34.0 PG    MCHC 31.6 30.0 - 36.5 g/dL    RDW 03.0 (H) 09.2 - 14.5 %    Platelets 83 (L) 150 - 400 K/uL    MPV 9.7 8.9 - 12.9 FL    Nucleated RBCs 0.0 0.0 PER 100 WBC    nRBC 0.00 0.00 - 0.01 K/uL    Neutrophils % 68 32 - 75 %    Lymphocytes % 20 12 - 49 %    Monocytes % 10 5 - 13 %     Eosinophils % 1 0 - 7 %    Basophils % 0 0 - 1 %    Immature Granulocytes 1 (H) 0 - 0.5 %    Neutrophils Absolute 2.4 1.8 - 8.0 K/UL    Lymphocytes Absolute 0.7 (L) 0.8 - 3.5 K/UL    Monocytes Absolute 0.4 0.0 - 1.0 K/UL    Eosinophils Absolute 0.0 0.0 - 0.4 K/UL    Basophils Absolute 0.0 0.0 - 0.1 K/UL    Absolute Immature Granulocyte 0.0 0.00 - 0.04 K/UL    Differential Type Smear Scanned      RBC Comment Normocytic, Normochromic      RBC Comment Anisocytosis  1+       Renal Function Panel    Collection Time: 04/10/22  6:30 AM   Result Value Ref Range    Sodium 139 136 - 145 mmol/L    Potassium 3.6 3.5 - 5.1 mmol/L    Chloride 109 (H) 97 - 108 mmol/L    CO2 26 21 - 32 mmol/L    Anion Gap 4 (L) 5 - 15 mmol/L    Glucose 104 (H) 65 - 100 mg/dL    BUN 6 6 - 20 mg/dL    Creatinine 3.30 (L) 0.70 - 1.30 mg/dL    Bun/Cre Ratio 12 12 - 20      Est, Glom Filt Rate >60 >60 ml/min/1.76m2    Calcium 7.1 (L) 8.5 - 10.1 mg/dL    Phosphorus 2.4 (L) 2.6 - 4.7 mg/dL    Albumin 1.7 (L) 3.5 - 5.0 g/dL   Magnesium    Collection Time: 04/10/22  6:30 AM   Result Value Ref Range    Magnesium 2.0 1.6 - 2.4 mg/dL   CBC with Auto Differential    Collection Time: 04/12/22  5:07 AM   Result Value Ref Range    WBC 2.9 (L) 4.1 - 11.1 K/uL    RBC 3.10 (L) 4.10 - 5.70 M/uL    Hemoglobin 7.7 (L) 12.1 - 17.0 g/dL    Hematocrit 07.6 (L) 36.6 - 50.3 %    MCV 78.7 (L) 80.0 - 99.0 FL    MCH 24.8 (L) 26.0 - 34.0 PG    MCHC 31.6 30.0 - 36.5 g/dL    RDW 22.6 (H) 33.3 - 14.5 %    Platelets 84 (L) 150 - 400 K/uL    MPV 9.7 8.9 - 12.9 FL    Nucleated RBCs 0.0 0.0 PER 100 WBC    nRBC 0.00 0.00 - 0.01 K/uL    Neutrophils %  59 32 - 75 %    Lymphocytes % 27 12 - 49 %    Monocytes % 12 5 - 13 %    Eosinophils % 1 0 - 7 %    Basophils % 0 0 - 1 %    Immature Granulocytes 1 (H) 0 - 0.5 %    Neutrophils Absolute 1.7 (L) 1.8 - 8.0 K/UL    Lymphocytes Absolute 0.8 0.8 - 3.5 K/UL    Monocytes Absolute 0.4 0.0 - 1.0 K/UL    Eosinophils Absolute 0.0 0.0 - 0.4 K/UL     Basophils Absolute 0.0 0.0 - 0.1 K/UL    Absolute Immature Granulocyte 0.0 0.00 - 0.04 K/UL    Differential Type AUTOMATED     Renal Function Panel    Collection Time: 04/12/22  5:07 AM   Result Value Ref Range    Sodium 138 136 - 145 mmol/L    Potassium 3.0 (L) 3.5 - 5.1 mmol/L    Chloride 108 97 - 108 mmol/L    CO2 24 21 - 32 mmol/L    Anion Gap 6 5 - 15 mmol/L    Glucose 86 65 - 100 mg/dL    BUN 7 6 - 20 mg/dL    Creatinine 6.96 (L) 0.70 - 1.30 mg/dL    Bun/Cre Ratio 12 12 - 20      Est, Glom Filt Rate >60 >60 ml/min/1.75m2    Calcium 7.2 (L) 8.5 - 10.1 mg/dL    Phosphorus 3.0 2.6 - 4.7 mg/dL    Albumin 1.6 (L) 3.5 - 5.0 g/dL   Magnesium    Collection Time: 04/12/22  5:07 AM   Result Value Ref Range    Magnesium 1.8 1.6 - 2.4 mg/dL   Ammonia    Collection Time: 04/12/22  5:07 AM   Result Value Ref Range    Ammonia 27 <32 umol/L   Protime-INR    Collection Time: 04/12/22  5:07 AM   Result Value Ref Range    Protime 16.0 (H) 11.9 - 14.6 sec    INR 1.2 (H) 0.9 - 1.1               ______________________________________________________________________  DISCHARGE SUMMARY/HOSPITAL COURSE:  for full details see H&P, daily progress notes, labs, consult notes.                 CT ABDOMEN PELVIS W IV CONTRAST Result Date: 04/05/2022     Cirrhotic change with splenomegaly. Postcholecystectomy. Incidental and/or nonemergent findings are as described above.       EGD- Impression:  Abnormal esophageal motility.  Portal hypertensive gastropathy.  Normal examined duodenum.  No specimens collected.  Recommendation:  Continue present medications.        Colonosocpy 04/09/22- Impression:         -  Perianal skin tags found on perianal exam.         -  Many 5 to 13 mm polyps in the ascending colon and in the transverse colon,            removed with a cold biopsy forceps.  Resected and retrieved.         -  Moderate diverticulosis in the descending colon and in the sigmoid colon.            Peri-diverticular erythema was seen.         -   Internal hemorrhoids.  Recommendation:         -  Await pathology  results.         -  Resume regular diet and high fiber diet.         -  Continue present medications.         -  Repeat colonoscopy in 3 years for surveillance.     Assessment/Plan:      Suspected GI bleed with acute blood loss anemia-  S/p PRBC  Stable H&H >7  Cont oral PPI  Egd 8/9- as above  Colo 8/11-  as above  Follow GI as OP  High fiber diet  Monitor h/h at SNF  Cont oral iron     Cirrhotic liver with splenomegaly-  Monitor INR and ammonia periodically at SNF  LFTs  ok  GI on case follow as OP for further w/u  Hematology on case  for splenomegaly fu as OP        Electrolyte imbalances-monitor and replete  k repeat BMP at SNF 3 days  HLD-on statins  Previous DVT-holding anticoagulation per  hematology on case as risk of bleeding. Cont asa may need to stop if drop in hgb or rebleedid     Mild pancytopenia-from splenomegaly and COL,monitor CBC periodically at SNF, hematology  on case fu as OP     Chronic A-fib-continue Amio, holding AC as high risk of bleeding,   asa 81mg . Holding BB as soft BP.   ?Parkinson's disease-continue Sinemet  Hypotension-  from cirrhosis, midodrine tid, holding coreg.         _______________________________________________________________________  Patient seen and examined by me on discharge day.  Patient maximized inpatient benefit stay, hemoglobin has remained stable between 7-8.  After discussing with son it was decided to get him off Eliquis or any anticoagulation start aspirin.  If patient continues to have low hemoglobin and bleeding issues may need to stop aspirin.  No other acute issues medically stable for discharge to skilled nursing facility if bed available today.      Pertinent Findings:  Gen:    Not in distress  Chest: Symmetric expansion  CVS:   Regular rhythm.  No edema  Abd:  Soft, not distended, not tender  Neuro:  Alert,    _______________________________________________________________________  DISCHARGE MEDICATIONS:      Medication List        START taking these medications      aspirin 81 MG EC tablet  Take 1 tablet by mouth daily     midodrine 10 MG tablet  Commonly known as: PROAMATINE  Take 1 tablet by mouth 3 times daily (with meals)     phosphorus 155-852-130 MG tablet  Commonly known as: K PHOS NEUTRAL  Take 2 tablets by mouth 4 times daily for 5 days            CONTINUE taking these medications      albuterol sulfate HFA 108 (90 Base) MCG/ACT inhaler  Commonly known as: PROVENTIL;VENTOLIN;PROAIR     alendronate 70 MG tablet  Commonly known as: FOSAMAX     amiodarone 200 MG tablet  Commonly known as: CORDARONE     carbidopa-levodopa 50-200 MG per extended release tablet  Commonly known as: SINEMET CR     cholestyramine 4 g packet  Commonly known as: Questran  Take 1 packet by mouth 3 times daily (with meals) for 7 days     ferrous sulfate 325 (65 Fe) MG tablet  Commonly known as: IRON 325  Take 1 tablet by mouth daily (with breakfast)     gabapentin 100  MG capsule  Commonly known as: NEURONTIN     Jardiance 10 MG tablet  Generic drug: empagliflozin     omeprazole 40 MG delayed release capsule  Commonly known as: PRILOSEC     rosuvastatin 20 MG tablet  Commonly known as: CRESTOR     Trelegy Ellipta 100-62.5-25 MCG/ACT Aepb inhaler  Generic drug: fluticasone-umeclidin-vilant     VORTIoxetine HBr 20 MG Tabs tablet  Commonly known as: TRINTELLIX            STOP taking these medications      apixaban 5 MG Tabs tablet  Commonly known as: ELIQUIS     carvedilol 6.25 MG tablet  Commonly known as: COREG     ranolazine 500 MG extended release tablet  Commonly known as: RANEXA               Where to Get Your Medications        Information about where to get these medications is not yet available    Ask your nurse or doctor about these medications  aspirin 81 MG EC tablet  midodrine 10 MG tablet  phosphorus 155-852-130 MG tablet            Patient Follow Up Instructions:   Diet soft mechanical  Activity slowly increase to levels before  Check labs CBC/BMP/mag in 3 to 5 days at SNF  Strict fall/aspiration/pressure ulcer precautions  Stop Eliquis    Follow colon polyp biopsy results at GI office next visit  Avoid any  NSAIDs    Stop asa if hgb drops or GI bleed  Return to emergency or call ambulance immediately if symptoms recur or get worse    Herma MeringValeria Madrid-Malo, MD  [address]  [address]  [place] [id number]  [phone]    Follow up in 1 week(s)  Posthospital discharge follow-up care    Era BumpersZahid Rashid, MD  [address]  [address]  [place] [id number]  [phone]    Follow up in 2 week(s)  Posthospital discharge follow-up care for colon biopsy results and cirrhosis of liver mets adjustment    Grayland JackYogesh Gandhi, MD  [address]  [place] [id number]  [phone]    Follow up in 3 week(s)  Posthospital discharge follow-up for DVT/pancytopenia and cirrhosis of liver with splenomegaly     ________________________________________________________________      Condition at Discharge:  Stable  __________________________________________________________________    Disposition-SNF if bed available today  ____________________________________________________________________    Code Status: DNR   ___________________________________________________________________      Total time in minutes spent coordinating this discharge:  35 minutes    This is dictation was done by dragon, computer voice recognition software.  Quite often unanticipated grammatical, syntax, homophones and other interpretive errors or inadvertently transcribed by the computer software.  Please excuse errors that have escaped final proofreading. Please contact me for any questions or details.  Thank you.       Signed:  Valetta CloseSami Aviela Blundell, MD

## 2022-04-12 NOTE — Plan of Care (Signed)
Problem: Skin/Tissue Integrity  Goal: Absence of new skin breakdown  Description: 1.  Monitor for areas of redness and/or skin breakdown  2.  Assess vascular access sites hourly  3.  Every 4-6 hours minimum:  Change oxygen saturation probe site  4.  Every 4-6 hours:  If on nasal continuous positive airway pressure, respiratory therapy assess nares and determine need for appliance change or resting period.  04/12/2022 1107 by Casey Burkitt, RN  Outcome: Progressing  04/12/2022 0153 by Buzzy Han, RN  Outcome: Progressing     Problem: ABCDS Injury Assessment  Goal: Absence of physical injury  04/12/2022 1107 by Casey Burkitt, RN  Outcome: Progressing  04/12/2022 0153 by Buzzy Han, RN  Outcome: Progressing     Problem: Safety - Adult  Goal: Free from fall injury  04/12/2022 1107 by Casey Burkitt, RN  Outcome: Progressing  04/12/2022 0153 by Buzzy Han, RN  Outcome: Progressing

## 2022-04-12 NOTE — Progress Notes (Signed)
..  Discharge plan of care/case management plan validated with provider discharge order. Discharge order verified, removed IV line saline lock aseptically and put a pressure on IV site for 3-5 mins, removed wick and provided with urinal, helped patient to change clothes, due oral medications given. Report given to Kia, Engineer, manufacturing in [facility] thru phone. Patient alert x 2-3, on easy to chew diet, not in any form of  distress, discharge per stretcher with [facility].

## 2022-04-13 NOTE — Care Coordination-Inpatient (Signed)
Transition of care outreach postponed for 7 days due to patient's discharge to [facility] SNF.

## 2022-04-22 NOTE — Care Coordination-Inpatient (Signed)
Transition of care outreach postponed for 7 days due to patient's discharge to [facility] SNF.

## 2022-04-25 ENCOUNTER — Emergency Department: Admit: 2022-04-26 | Payer: MEDICARE | Primary: Internal Medicine

## 2022-04-25 DIAGNOSIS — N179 Acute kidney failure, unspecified: Principal | ICD-10-CM

## 2022-04-25 DIAGNOSIS — A0472 Enterocolitis due to Clostridium difficile, not specified as recurrent: Secondary | ICD-10-CM

## 2022-04-25 NOTE — ED Notes (Signed)
Report called to [facility], RN on 4E.     Lindwood Qua, LPN  83/15/17 [phone]

## 2022-04-25 NOTE — ED Triage Notes (Signed)
To ED via EMS c/o increasing generalized weakness, reported  by staff at [facility] as failure to thrive. AAOX4. Noted by EMS to be in bigeminy upon arrival, improvement with bolus of LR. History of Afib, noted to have irregular rhythm at 60-80 on cardiac monitor, pulse oximetry showing HR in 30s and 40s, pulse feels thready, slow, and irregular at radial site.

## 2022-04-25 NOTE — ED Provider Notes (Signed)
[facility] EMERGENCY DEPT  EMERGENCY DEPARTMENT HISTORY AND PHYSICAL EXAM      Date: 04/25/2022  Patient Name: Carlos Petersen  MRN: [id number]  Birthdate: 1950/11/26  Date of evaluation: 04/25/2022  Provider: Earnestine Mealing, MD   Note Started: 10:44 PM EDT 04/25/22    HISTORY OF PRESENT ILLNESS     Chief Complaint   Patient presents with    Fatigue       History Provided By: Patient    HPI: Carlos Petersen is a 71 y.o. male presents to the emergency department from [place] [facility] for evaluation of generalized weakness, decreased p.o. intake, abdominal discomfort ongoing for the last week.  Patient has a history of diabetes, COPD, Parkinson's, pacemaker, A-fib not currently on blood thinners due to recent GI bleed.  Family states that patient has not had any substantive p.o. intake over the last week, has had multiple episodes of loose stool, patient denies any abdominal pain, denies any nausea states he is "not hungry".  Patient denies any chest pains or shortness of breath from baseline.    PAST MEDICAL HISTORY   Past Medical History:  Past Medical History:   Diagnosis Date    Cerebral artery occlusion with cerebral infarction ([place])     COPD (chronic obstructive pulmonary disease) ([place])     Depression     Diabetes ([place])     Emphysema lung (H[facility])     Heart disease     Hypercholesterolemia     Parkinson disease ([place])     Pulmonary embolism ([place])        Past Surgical History:  Past Surgical History:   Procedure Laterality Date    CARDIAC DEFIBRILLATOR PLACEMENT      COLONOSCOPY      COLONOSCOPY N/A 04/07/2022    COLONOSCOPY DIAGNOSTIC performed by Crisoforo Oxford, MD at [facility] N/A 04/07/2022    COLONOSCOPY POLYPECTOMY SNARE/COLD BIOPSY performed by Crisoforo Oxford, MD at [facility] N/A 04/09/2022    COLONOSCOPY POLYPECTOMY REMOVAL SNARE/STOMA performed by Crisoforo Oxford, MD at [place] Right 01/2016    UPPER GASTROINTESTINAL  ENDOSCOPY N/A 04/07/2022    EGD ESOPHAGOGASTRODUODENOSCOPY performed by Crisoforo Oxford, MD at [facility] ENDOSCOPY       Family History:  No family history on file.    Social History:  Social History     Tobacco Use    Smoking status: Every Day     Packs/day: 0.50     Types: Cigarettes    Smokeless tobacco: Never   Substance Use Topics    Alcohol use: Never    Drug use: Never       Allergies:  No Known Allergies    PCP: Gaetano Net, MD    Current Meds:   Current Facility-Administered Medications   Medication Dose Route Frequency Provider Last Rate Last Admin    sodium chloride 0.9 % bolus 1,000 mL  1,000 mL IntraVENous Once Sanda Klein, MD        potassium chloride 10 mEq/100 mL IVPB (Peripheral Line)  10 mEq IntraVENous Q1H Sanda Klein, MD         Current Outpatient Medications   Medication Sig Dispense Refill    midodrine (PROAMATINE) 10 MG tablet Take 1 tablet by mouth 3 times daily (with meals) 90 tablet 0    phosphorus (K PHOS  NEUTRAL) 155-852-130 MG tablet Take 2 tablets by mouth 4 times daily for 5 days 30 tablet 0    aspirin 81 MG EC tablet Take 1 tablet by mouth daily 30 tablet 3    ferrous sulfate (IRON 325) 325 (65 Fe) MG tablet Take 1 tablet by mouth daily (with breakfast) 30 tablet 0    cholestyramine (QUESTRAN) 4 g packet Take 1 packet by mouth 3 times daily (with meals) for 7 days 21 packet 0    VORTIoxetine HBr (TRINTELLIX) 20 MG TABS tablet Take 1 tablet by mouth daily      fluticasone-umeclidin-vilant (TRELEGY ELLIPTA) 100-62.5-25 MCG/ACT AEPB inhaler Inhale 1 puff into the lungs daily      alendronate (FOSAMAX) 70 MG tablet Take 1 tablet by mouth every 7 days On Wednesday      albuterol sulfate HFA (PROVENTIL;VENTOLIN;PROAIR) 108 (90 Base) MCG/ACT inhaler Inhale 2 puffs into the lungs every 6 hours as needed      amiodarone (CORDARONE) 200 MG tablet Take 1 tablet by mouth daily      carbidopa-levodopa (SINEMET CR) 50-200 MG per extended release tablet Take 2 tablets by mouth 3 times daily 180  tablet 11    JARDIANCE 10 MG tablet Take 1 tablet by mouth daily      gabapentin (NEURONTIN) 100 MG capsule Take 1 capsule by mouth in the morning and at bedtime.      omeprazole (PRILOSEC) 40 MG delayed release capsule Take 1 capsule by mouth daily      rosuvastatin (CRESTOR) 20 MG tablet Take 1 tablet by mouth daily         Social Determinants of Health:   Social Determinants of Health     Tobacco Use: High Risk    Smoking Tobacco Use: Every Day    Smokeless Tobacco Use: Never    Passive Exposure: Not on file   Alcohol Use: Not At Risk    Frequency of Alcohol Consumption: Never    Average Number of Drinks: Patient does not drink    Frequency of Binge Drinking: Never   Emergency planning/management officer Strain: Not on file   Food Insecurity: Not on file   Transportation Needs: Not on file   Physical Activity: Not on file   Stress: Not on file   Social Connections: Not on file   Intimate Partner Violence: Not on file   Depression: Not at risk    PHQ-2 Score: 0   Housing Stability: Not on file       PHYSICAL EXAM   Physical Exam  Vitals and nursing note reviewed.   Constitutional:       General: He is not in acute distress.     Appearance: Normal appearance. He is ill-appearing.   HENT:      Head: Normocephalic and atraumatic.      Nose: Nose normal.   Eyes:      Extraocular Movements: Extraocular movements intact.   Cardiovascular:      Rate and Rhythm: Normal rate. Rhythm irregular.      Heart sounds: Normal heart sounds.   Pulmonary:      Effort: Pulmonary effort is normal.      Breath sounds: Examination of the right-lower field reveals decreased breath sounds. Examination of the left-lower field reveals decreased breath sounds. Decreased breath sounds present.   Abdominal:      General: Abdomen is flat. There is no distension.      Tenderness: There is no abdominal tenderness. There is no guarding.  Musculoskeletal:         General: Normal range of motion.      Cervical back: Normal range of motion and neck supple.   Skin:      General: Skin is warm.      Coloration: Skin is pale.   Neurological:      General: No focal deficit present.      Mental Status: He is alert and oriented to person, place, and time.      Motor: No weakness.      Gait: Gait normal.         SCREENINGS              LAB, EKG AND DIAGNOSTIC RESULTS   Labs:  Recent Results (from the past 12 hour(s))   CBC with Auto Differential    Collection Time: 04/25/22  9:14 PM   Result Value Ref Range    WBC 6.8 4.1 - 11.1 K/uL    RBC 2.92 (L) 4.10 - 5.70 M/uL    Hemoglobin 7.7 (L) 12.1 - 17.0 g/dL    Hematocrit 24.8 (L) 36.6 - 50.3 %    MCV 84.9 80.0 - 99.0 FL    MCH 26.4 26.0 - 34.0 PG    MCHC 31.0 30.0 - 36.5 g/dL    RDW 24.2 (H) 11.5 - 14.5 %    Platelets 121 (L) 150 - 400 K/uL    MPV 8.9 8.9 - 12.9 FL    Nucleated RBCs 0.0 0.0 PER 100 WBC    nRBC 0.00 0.00 - 0.01 K/uL    Neutrophils % 66 32 - 75 %    Lymphocytes % 23 12 - 49 %    Monocytes % 9 5 - 13 %    Eosinophils % 1 0 - 7 %    Basophils % 0 0 - 1 %    Immature Granulocytes 1 (H) 0 - 0.5 %    Neutrophils Absolute 4.4 1.8 - 8.0 K/UL    Lymphocytes Absolute 1.6 0.8 - 3.5 K/UL    Monocytes Absolute 0.6 0.0 - 1.0 K/UL    Eosinophils Absolute 0.1 0.0 - 0.4 K/UL    Basophils Absolute 0.0 0.0 - 0.1 K/UL    Absolute Immature Granulocyte 0.0 0.00 - 0.04 K/UL    Differential Type AUTOMATED     CMP    Collection Time: 04/25/22  9:14 PM   Result Value Ref Range    Sodium 141 136 - 145 mmol/L    Potassium 2.4 (LL) 3.5 - 5.1 mmol/L    Chloride 111 (H) 97 - 108 mmol/L    CO2 19 (L) 21 - 32 mmol/L    Anion Gap 11 5 - 15 mmol/L    Glucose 84 65 - 100 mg/dL    BUN 25 (H) 6 - 20 mg/dL    Creatinine 3.46 (H) 0.70 - 1.30 mg/dL    Bun/Cre Ratio 7 (L) 12 - 20      Est, Glom Filt Rate 18 (L) >60 ml/min/1.38m    Calcium 6.3 (LL) 8.5 - 10.1 mg/dL    Total Bilirubin 0.5 0.2 - 1.0 mg/dL    AST 24 15 - 37 U/L    ALT <6 (L) 12 - 78 U/L    Alk Phosphatase 61 45 - 117 U/L    Total Protein 5.9 (L) 6.4 - 8.2 g/dL    Albumin 1.5 (L) 3.5 - 5.0 g/dL     Globulin 4.4 (H) 2.0 - 4.0 g/dL  Albumin/Globulin Ratio 0.3 (L) 1.1 - 2.2     Troponin    Collection Time: 04/25/22  9:14 PM   Result Value Ref Range    Troponin, High Sensitivity 22 0 - 76 ng/L   Lipase    Collection Time: 04/25/22  9:14 PM   Result Value Ref Range    Lipase 46 (L) 73 - 393 U/L       EKG: Paced rhythm 70, frequent PVCs noted    Radiologic Studies:  Non-plain film images such as CT, Ultrasound and MRI are read by the radiologist. Plain radiographic images are visualized and preliminarily interpreted by the ED Physician with the following findings: Xray Interpreted by me.  Shows no cardiomegaly, minimal right-sided costophrenic angle blunting, pacemaker    Interpretation per the Radiologist below, if available at the time of this note:  CT ABDOMEN PELVIS WO CONTRAST Additional Contrast? None   Final Result      1. Diffuse colitis.   2. 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   3. New mild to moderate right pleural effusion with bibasilar atelectasis. New   14 mm left lower lobe nodular lung opacity. Recommend follow-up in 3 months.   4. Refer to above findings for complete details.       XR CHEST PORTABLE    (Results Pending)        ED COURSE and DIFFERENTIAL DIAGNOSIS/MDM   CC/HPI Summary, DDx, ED Course, and Reassessment: 71 year old male, multiple chronic comorbidities including COPD, A-fib, recent GI bleed, presents emergency department for decreased p.o. intake, generalized fatigue.    Physical exam shows chronically ill-appearing male, pale, however in no distress, stable vitals, diminished breath sounds bilateral, abdomen soft nontender nondistended.    Differential diagnosis includes dehydration, electrolyte abnormality, sepsis, UTI, obstruction.    We will obtain basic lab work, CT abdomen pelvis, initiate IV fluids suspect dehydration.    Records Reviewed (source and summary of external notes): Prior medical records and Nursing notes    Vitals:    Vitals:    04/25/22 2102   BP: 117/65    Pulse: 71   Resp: 13   Temp: 98.2 F (36.8 C)   TempSrc: Oral   SpO2: 99%   Weight: 71.7 kg (158 lb)   Height: 1.803 m ('5\' 11"' )        ED COURSE  ED Course as of 04/25/22 2244   Sun Apr 25, 2022   2230 Creatinine(!): 3.46 [PZ]   2230 Potassium(!!): 2.4 [PZ]   2231 Troponin, High Sensitivity: 22 [PZ]   2231 Lab work concerning for significant metabolic abnormalities including acute renal injury, BUN 25 creatinine 3.46, hypokalemia potassium 2.4, bicarb 19, patient's CBC shows mild stable anemia with hemoglobin 7.7, no leukocytosis.    CT abdomen pelvis shows diffuse colitis, no obstruction, cirrhosis, right pleural effusion noted. [PZ]   2244 Discussed case with Dr. Marlise Eves, covering Dr. Clinton Sawyer, will admit patient for dehydration, acute kidney injury, hyperkalemia, colitis, pleural effusion [PZ]      ED Course User Index  [PZ] Sanda Klein, MD       Disposition Considerations (Tests not done, Shared Decision Making, Pt Expectation of Test or Treatment.): Not Applicable    Patient was given the following medications:  Medications   sodium chloride 0.9 % bolus 1,000 mL (has no administration in time range)   potassium chloride 10 mEq/100 mL IVPB (Peripheral Line) (has no administration in time range)       CONSULTS: (Who and What  was discussed)  None     Social Determinants affecting Dx or Tx: None    Smoking Cessation: Not Applicable    PROCEDURES   Unless otherwise noted above, none.  Procedures      CRITICAL CARE TIME   Patient does not meet Critical Care Time, 0 minutes    FINAL IMPRESSION   No diagnosis found.      DISPOSITION/PLAN   DISPOSITION      Admit Note: Pt is being admitted by Dr. Marlise Eves. The results of their tests and reason(s) for their admission have been discussed with pt and/or available family. They convey agreement and understanding for the need to be admitted and for the admission diagnosis.     PATIENT REFERRED TO:  No follow-up provider specified.      DISCHARGE MEDICATIONS:     Medication  List        ASK your doctor about these medications      albuterol sulfate HFA 108 (90 Base) MCG/ACT inhaler  Commonly known as: PROVENTIL;VENTOLIN;PROAIR     alendronate 70 MG tablet  Commonly known as: FOSAMAX     amiodarone 200 MG tablet  Commonly known as: CORDARONE     aspirin 81 MG EC tablet  Take 1 tablet by mouth daily     carbidopa-levodopa 50-200 MG per extended release tablet  Commonly known as: SINEMET CR     cholestyramine 4 g packet  Commonly known as: Questran  Take 1 packet by mouth 3 times daily (with meals) for 7 days     ferrous sulfate 325 (65 Fe) MG tablet  Commonly known as: IRON 325  Take 1 tablet by mouth daily (with breakfast)     gabapentin 100 MG capsule  Commonly known as: NEURONTIN     Jardiance 10 MG tablet  Generic drug: empagliflozin     midodrine 10 MG tablet  Commonly known as: PROAMATINE  Take 1 tablet by mouth 3 times daily (with meals)     omeprazole 40 MG delayed release capsule  Commonly known as: PRILOSEC     phosphorus 155-852-130 MG tablet  Commonly known as: K PHOS NEUTRAL  Take 2 tablets by mouth 4 times daily for 5 days     rosuvastatin 20 MG tablet  Commonly known as: CRESTOR     Trelegy Ellipta 100-62.5-25 MCG/ACT Aepb inhaler  Generic drug: fluticasone-umeclidin-vilant     VORTIoxetine HBr 20 MG Tabs tablet  Commonly known as: TRINTELLIX                DISCONTINUED MEDICATIONS:  Current Discharge Medication List          I am the Primary Clinician of Record: Earnestine Mealing, MD (electronically signed)    (Please note that parts of this dictation were completed with voice recognition software. Quite often unanticipated grammatical, syntax, homophones, and other interpretive errors are inadvertently transcribed by the computer software. Please disregards these errors. Please excuse any errors that have escaped final proofreading.)      Sanda Klein, MD  04/25/22 2251

## 2022-04-26 ENCOUNTER — Inpatient Hospital Stay
Admission: EM | Admit: 2022-04-26 | Discharge: 2022-05-30 | Disposition: E | Payer: MEDICARE | Admitting: Internal Medicine

## 2022-04-26 ENCOUNTER — Inpatient Hospital Stay: Admit: 2022-04-26 | Payer: MEDICARE | Primary: Internal Medicine

## 2022-04-26 LAB — RENAL FUNCTION PANEL
Albumin: 1.5 g/dL — ABNORMAL LOW (ref 3.5–5.0)
Anion Gap: 10 mmol/L (ref 5–15)
BUN: 27 mg/dL — ABNORMAL HIGH (ref 6–20)
Bun/Cre Ratio: 8 — ABNORMAL LOW (ref 12–20)
CO2: 20 mmol/L — ABNORMAL LOW (ref 21–32)
Calcium: 6.6 mg/dL — ABNORMAL LOW (ref 8.5–10.1)
Chloride: 111 mmol/L — ABNORMAL HIGH (ref 97–108)
Creatinine: 3.5 mg/dL — ABNORMAL HIGH (ref 0.70–1.30)
Est, Glom Filt Rate: 18 mL/min/{1.73_m2} — ABNORMAL LOW (ref >60)
Glucose: 82 mg/dL (ref 65–100)
Phosphorus: 4.7 mg/dL (ref 2.6–4.7)
Potassium: 2.5 mmol/L — CL (ref 3.5–5.1)
Sodium: 141 mmol/L (ref 136–145)

## 2022-04-26 LAB — POTASSIUM, URINE, RANDOM: POTASSIUM, RANDOM URINE: 16 mmol/L

## 2022-04-26 LAB — COMPREHENSIVE METABOLIC PANEL
ALT: <6 U/L — ABNORMAL LOW (ref 12–78)
AST: 24 U/L (ref 15–37)
Albumin/Globulin Ratio: 0.3 — ABNORMAL LOW (ref 1.1–2.2)
Albumin: 1.5 g/dL — ABNORMAL LOW (ref 3.5–5.0)
Alk Phosphatase: 61 U/L (ref 45–117)
Anion Gap: 11 mmol/L (ref 5–15)
BUN: 25 mg/dL — ABNORMAL HIGH (ref 6–20)
Bun/Cre Ratio: 7 — ABNORMAL LOW (ref 12–20)
CO2: 19 mmol/L — ABNORMAL LOW (ref 21–32)
Calcium: 6.3 mg/dL — CL (ref 8.5–10.1)
Chloride: 111 mmol/L — ABNORMAL HIGH (ref 97–108)
Creatinine: 3.46 mg/dL — ABNORMAL HIGH (ref 0.70–1.30)
Est, Glom Filt Rate: 18 mL/min/{1.73_m2} — ABNORMAL LOW (ref >60)
Globulin: 4.4 g/dL — ABNORMAL HIGH (ref 2.0–4.0)
Glucose: 84 mg/dL (ref 65–100)
Potassium: 2.4 mmol/L — CL (ref 3.5–5.1)
Sodium: 141 mmol/L (ref 136–145)
Total Bilirubin: 0.5 mg/dL (ref 0.2–1.0)
Total Protein: 5.9 g/dL — ABNORMAL LOW (ref 6.4–8.2)

## 2022-04-26 LAB — URINALYSIS
Bilirubin Urine: NEGATIVE
Glucose, UA: NEGATIVE mg/dL
Ketones, Urine: NEGATIVE mg/dL
Leukocyte Esterase, Urine: NEGATIVE
Nitrite, Urine: NEGATIVE
Protein, UA: 100 mg/dL — AB
Specific Gravity, UA: 1.014 (ref 1.003–1.030)
Urobilinogen, Urine: 0.1 EU/dL (ref 0.1–1.0)
pH, Urine: 5 (ref 5.0–8.0)

## 2022-04-26 LAB — C DIFF TOXIN/ANTIGEN
C Diff Toxin Interpretation: POSITIVE — AB
C difficile Toxin, EIA: POSITIVE — AB
GDH Antigen: POSITIVE

## 2022-04-26 LAB — CREATININE, RANDOM URINE: Creatinine, Ur: 126 mg/dL

## 2022-04-26 LAB — TROPONIN: Troponin, High Sensitivity: 22 ng/L (ref 0–76)

## 2022-04-26 LAB — EKG 12-LEAD
Atrial Rate: 70 {beats}/min
P Axis: -17 degrees
P-R Interval: 292 ms
Q-T Interval: 678 ms
QRS Duration: 154 ms
QTc Calculation (Bazett): 732 ms
R Axis: 43 degrees
T Axis: 108 degrees
Ventricular Rate: 70 {beats}/min

## 2022-04-26 LAB — CBC WITH AUTO DIFFERENTIAL
Absolute Immature Granulocyte: 0 10*3/uL (ref 0.00–0.04)
Absolute Immature Granulocyte: 0.1 10*3/uL — ABNORMAL HIGH (ref 0.00–0.04)
Basophils %: 0 % (ref 0–1)
Basophils %: 1 % (ref 0–1)
Basophils Absolute: 0 10*3/uL (ref 0.0–0.1)
Basophils Absolute: 0.1 10*3/uL (ref 0.0–0.1)
Eosinophils %: 1 % (ref 0–7)
Eosinophils %: 2 % (ref 0–7)
Eosinophils Absolute: 0.1 10*3/uL (ref 0.0–0.4)
Eosinophils Absolute: 0.1 10*3/uL (ref 0.0–0.4)
Hematocrit: 24.8 % — ABNORMAL LOW (ref 36.6–50.3)
Hematocrit: 24.7 % — ABNORMAL LOW (ref 36.6–50.3)
Hemoglobin: 7.7 g/dL — ABNORMAL LOW (ref 12.1–17.0)
Hemoglobin: 7.7 g/dL — ABNORMAL LOW (ref 12.1–17.0)
Immature Granulocytes: 1 % — ABNORMAL HIGH (ref 0–0.5)
Immature Granulocytes: 1 % — ABNORMAL HIGH (ref 0–0.5)
Lymphocytes %: 23 % (ref 12–49)
Lymphocytes %: 23 % (ref 12–49)
Lymphocytes Absolute: 1.6 10*3/uL (ref 0.8–3.5)
Lymphocytes Absolute: 1.1 10*3/uL (ref 0.8–3.5)
MCH: 26.4 PG (ref 26.0–34.0)
MCH: 26.4 PG (ref 26.0–34.0)
MCHC: 31 g/dL (ref 30.0–36.5)
MCHC: 31.2 g/dL (ref 30.0–36.5)
MCV: 84.9 FL (ref 80.0–99.0)
MCV: 84.6 FL (ref 80.0–99.0)
MPV: 8.9 FL (ref 8.9–12.9)
MPV: 8.9 FL (ref 8.9–12.9)
Monocytes %: 9 % (ref 5–13)
Monocytes %: 8 % (ref 5–13)
Monocytes Absolute: 0.6 10*3/uL (ref 0.0–1.0)
Monocytes Absolute: 0.4 10*3/uL (ref 0.0–1.0)
Neutrophils %: 66 % (ref 32–75)
Neutrophils %: 65 % (ref 32–75)
Neutrophils Absolute: 4.4 10*3/uL (ref 1.8–8.0)
Neutrophils Absolute: 3.1 10*3/uL (ref 1.8–8.0)
Nucleated RBCs: 0 PER 100 WBC
Nucleated RBCs: 0 PER 100 WBC
Platelets: 121 10*3/uL — ABNORMAL LOW (ref 150–400)
Platelets: 112 10*3/uL — ABNORMAL LOW (ref 150–400)
RBC: 2.92 M/uL — ABNORMAL LOW (ref 4.10–5.70)
RBC: 2.92 M/uL — ABNORMAL LOW (ref 4.10–5.70)
RDW: 24.2 % — ABNORMAL HIGH (ref 11.5–14.5)
RDW: 23.9 % — ABNORMAL HIGH (ref 11.5–14.5)
WBC: 6.8 10*3/uL (ref 4.1–11.1)
WBC: 4.9 10*3/uL (ref 4.1–11.1)
nRBC: 0 10*3/uL (ref 0.00–0.01)
nRBC: 0 10*3/uL (ref 0.00–0.01)

## 2022-04-26 LAB — URINALYSIS WITH MICROSCOPIC
Bilirubin Urine: NEGATIVE
Glucose, UA: NEGATIVE mg/dL
Ketones, Urine: 5 mg/dL — AB
Leukocyte Esterase, Urine: NEGATIVE
Nitrite, Urine: NEGATIVE
Protein, UA: 100 mg/dL — AB
Specific Gravity, UA: 1.014 (ref 1.003–1.030)
Urobilinogen, Urine: 0.1 EU/dL (ref 0.1–1.0)
pH, Urine: 5 (ref 5.0–8.0)

## 2022-04-26 LAB — MAGNESIUM: Magnesium: 2.1 mg/dL (ref 1.6–2.4)

## 2022-04-26 LAB — SODIUM, URINE, RANDOM: SODIUM, RANDOM URINE: 9 mmol/L

## 2022-04-26 LAB — CK: Total CK: 24 U/L — ABNORMAL LOW (ref 39–308)

## 2022-04-26 LAB — LIPASE: Lipase: 46 U/L — ABNORMAL LOW (ref 73–393)

## 2022-04-26 MED ORDER — ZINC OXIDE 40 % EX PSTE
40 | Freq: Two times a day (BID) | CUTANEOUS | Status: DC
Start: 2022-04-26 — End: 2022-05-06
  Administered 2022-04-27 – 2022-05-06 (×18): via TOPICAL

## 2022-04-26 MED ORDER — METRONIDAZOLE 500 MG/100ML IV SOLN
500 | Freq: Three times a day (TID) | INTRAVENOUS | Status: DC
Start: 2022-04-26 — End: 2022-05-09
  Administered 2022-04-26 – 2022-05-09 (×40): 500 mg via INTRAVENOUS

## 2022-04-26 MED ORDER — SODIUM CHLORIDE 0.9 % IV SOLN
0.9 % | INTRAVENOUS | Status: DC | PRN
Start: 2022-04-26 — End: 2022-05-10

## 2022-04-26 MED ORDER — SODIUM CHLORIDE 0.9 % IV BOLUS
0.9 % | Freq: Once | INTRAVENOUS | Status: AC
Start: 2022-04-26 — End: 2022-04-26
  Administered 2022-04-26: 03:00:00 1000 mL via INTRAVENOUS

## 2022-04-26 MED ORDER — CALCIUM GLUCONATE 10 % IV SOLN
10 % | Freq: Once | INTRAVENOUS | Status: AC
Start: 2022-04-26 — End: 2022-04-25
  Administered 2022-04-26: 04:00:00 1000 mg via INTRAVENOUS

## 2022-04-26 MED ORDER — SODIUM BICARBONATE 8.4 % IV SOLN
8.4 % | INTRAVENOUS | Status: AC
Start: 2022-04-26 — End: 2022-04-27
  Administered 2022-04-26 – 2022-04-27 (×2): via INTRAVENOUS

## 2022-04-26 MED ORDER — ACIDOPHILUS/CITRUS PECTIN PO TABS
Freq: Two times a day (BID) | ORAL | Status: DC
Start: 2022-04-26 — End: 2022-04-26

## 2022-04-26 MED ORDER — NORMAL SALINE FLUSH 0.9 % IV SOLN
0.9 % | Freq: Two times a day (BID) | INTRAVENOUS | Status: AC
Start: 2022-04-26 — End: 2022-05-10
  Administered 2022-04-26 – 2022-05-10 (×28): 10 mL via INTRAVENOUS

## 2022-04-26 MED ORDER — POTASSIUM CHLORIDE ER 10 MEQ PO TBCR
10 MEQ | ORAL | Status: AC
Start: 2022-04-26 — End: 2022-04-27
  Administered 2022-04-27 (×2): 40 meq via ORAL

## 2022-04-26 MED ORDER — POTASSIUM CHLORIDE 10 MEQ/100ML IV SOLN
10 MEQ/0ML | INTRAVENOUS | Status: AC
Start: 2022-04-26 — End: 2022-04-26
  Administered 2022-04-26 (×5): 10 meq via INTRAVENOUS

## 2022-04-26 MED ORDER — NYSTATIN 100000 UNIT/GM EX OINT
100000 | Freq: Two times a day (BID) | CUTANEOUS | Status: DC
Start: 2022-04-26 — End: 2022-05-06
  Administered 2022-04-27 – 2022-05-06 (×17): via TOPICAL

## 2022-04-26 MED ORDER — SODIUM BICARBONATE 8.4 % IV SOLN
8.4 % | INTRAVENOUS | Status: AC
Start: 2022-04-26 — End: 2022-04-26
  Administered 2022-04-26: 05:00:00 via INTRAVENOUS

## 2022-04-26 MED ORDER — SODIUM CHLORIDE 0.45 % IV SOLN
0.45 % | INTRAVENOUS | Status: DC
Start: 2022-04-26 — End: 2022-04-26
  Administered 2022-04-26: 17:00:00 via INTRAVENOUS

## 2022-04-26 MED ORDER — POTASSIUM CHLORIDE ER 10 MEQ PO TBCR
10 MEQ | ORAL | Status: DC
Start: 2022-04-26 — End: 2022-04-26

## 2022-04-26 MED ORDER — SACCHAROMYCES BOULARDII 250 MG PO CAPS
250 MG | Freq: Two times a day (BID) | ORAL | Status: DC
Start: 2022-04-26 — End: 2022-04-26

## 2022-04-26 MED ORDER — ONDANSETRON 4 MG PO TBDP
4 MG | Freq: Three times a day (TID) | ORAL | Status: AC | PRN
Start: 2022-04-26 — End: 2022-05-10
  Administered 2022-04-27 – 2022-05-02 (×4): 4 mg via ORAL

## 2022-04-26 MED ORDER — NORMAL SALINE FLUSH 0.9 % IV SOLN
0.9 % | INTRAVENOUS | Status: AC | PRN
Start: 2022-04-26 — End: 2022-05-10

## 2022-04-26 MED ORDER — ENOXAPARIN SODIUM 30 MG/0.3ML IJ SOSY
30 MG/0.3ML | Freq: Every day | INTRAMUSCULAR | Status: AC
Start: 2022-04-26 — End: 2022-04-29
  Administered 2022-04-26 – 2022-04-29 (×4): 30 mg via SUBCUTANEOUS

## 2022-04-26 MED ORDER — VANCOMYCIN HCL 50 MG/ML PO SOLR
50 | Freq: Four times a day (QID) | ORAL | Status: DC
Start: 2022-04-26 — End: 2022-04-29
  Administered 2022-04-27 – 2022-04-29 (×10): 250 mg via ORAL

## 2022-04-26 MED ORDER — MAGNESIUM SULFATE 2 GM/50ML IV SOLN
2 | Freq: Once | INTRAVENOUS | Status: AC
Start: 2022-04-26 — End: 2022-04-26
  Administered 2022-04-26: 04:00:00 2000 mg via INTRAVENOUS

## 2022-04-26 MED ORDER — CEFTRIAXONE SODIUM 1 G IJ SOLR
1 g | INTRAMUSCULAR | Status: DC
Start: 2022-04-26 — End: 2022-04-26
  Administered 2022-04-26: 17:00:00 1000 mg via INTRAVENOUS

## 2022-04-26 MED ORDER — ONDANSETRON HCL 4 MG/2ML IJ SOLN
4 MG/2ML | Freq: Four times a day (QID) | INTRAMUSCULAR | Status: AC | PRN
Start: 2022-04-26 — End: 2022-05-10
  Administered 2022-04-28 – 2022-05-09 (×6): 4 mg via INTRAVENOUS

## 2022-04-26 MED ORDER — ACIDOPHILUS/CITRUS PECTIN PO TABS
Freq: Every day | ORAL | Status: DC
Start: 2022-04-26 — End: 2022-05-10
  Administered 2022-04-27 – 2022-05-09 (×14): 1 via ORAL

## 2022-04-26 MED ORDER — ACETAMINOPHEN 650 MG RE SUPP
650 | Freq: Four times a day (QID) | RECTAL | Status: DC | PRN
Start: 2022-04-26 — End: 2022-05-10

## 2022-04-26 MED ORDER — ACETAMINOPHEN 325 MG PO TABS
325 | Freq: Four times a day (QID) | ORAL | Status: DC | PRN
Start: 2022-04-26 — End: 2022-05-10
  Administered 2022-05-05: 02:00:00 650 mg via ORAL

## 2022-04-26 MED FILL — FLORASTOR 250 MG PO CAPS: 250 MG | ORAL | Qty: 1

## 2022-04-26 MED FILL — SODIUM BICARBONATE 8.4 % IV SOLN: 8.4 % | INTRAVENOUS | Qty: 150

## 2022-04-26 MED FILL — NYSTATIN 100000 UNIT/GM EX OINT: 100000 UNIT/GM | CUTANEOUS | Qty: 30

## 2022-04-26 MED FILL — SODIUM CHLORIDE FLUSH 0.9 % IV SOLN: 0.9 % | INTRAVENOUS | Qty: 40

## 2022-04-26 MED FILL — VANCOMYCIN HCL 250 MG/5ML PO SOLR: 250 MG/5ML | ORAL | Qty: 5

## 2022-04-26 MED FILL — SODIUM CHLORIDE 0.9 % IV SOLN: 0.9 % | INTRAVENOUS | Qty: 1000

## 2022-04-26 MED FILL — DESITIN 40 % EX PSTE: 40 % | CUTANEOUS | Qty: 57

## 2022-04-26 MED FILL — POTASSIUM CHLORIDE ER 10 MEQ PO TBCR: 10 MEQ | ORAL | Qty: 4

## 2022-04-26 MED FILL — SODIUM CHLORIDE 0.45 % IV SOLN: 0.45 % | INTRAVENOUS | Qty: 1000

## 2022-04-26 NOTE — Progress Notes (Signed)
Paged provider and informed primary nurse of abnormal lab result.

## 2022-04-26 NOTE — Plan of Care (Signed)
Problem: Skin/Tissue Integrity - Adult  Goal: Skin integrity remains intact  Recent Flowsheet Documentation  Taken 05/08/22 0037 by Tish Frederickson, RN  Skin Integrity Remains Intact: Monitor for areas of redness and/or skin breakdown

## 2022-04-26 NOTE — H&P (Signed)
Department of Internal Medicine  General Internal Medicine  Attending History and Physical      CHIEF COMPLAINT: Not feeling well    Reason for Admission: Acute kidney injury with possible acute tubular necrosis    History Obtained From:  patient    HISTORY OF PRESENT ILLNESS:      The patient is a 71 y.o. male with significant past medical history of COPD, Parkinson's disease, diabetes mellitus, diabetic neuropathy, high cholesterol who presents with complaint of not feeling well patient is a poor historian emergency department creatinine was elevated and patient was seen metabolic acidosis    Past Medical History:        Diagnosis Date    Cerebral artery occlusion with cerebral infarction ([facility])     COPD (chronic obstructive pulmonary disease) (HCC)     Depression     Diabetes (HCC)     Emphysema lung (HCC)     Heart disease     Hypercholesterolemia     Parkinson disease (HCC)     Pulmonary embolism (HCC)      Past Surgical History:        Procedure Laterality Date    CARDIAC DEFIBRILLATOR PLACEMENT      COLONOSCOPY      COLONOSCOPY N/A 04/07/2022    COLONOSCOPY DIAGNOSTIC performed by Skeet Simmer, MD at [facility] ENDOSCOPY    COLONOSCOPY N/A 04/07/2022    COLONOSCOPY POLYPECTOMY SNARE/COLD BIOPSY performed by Skeet Simmer, MD at [facility] ENDOSCOPY    COLONOSCOPY N/A 04/09/2022    COLONOSCOPY POLYPECTOMY REMOVAL SNARE/STOMA performed by Skeet Simmer, MD at [facility] ENDOSCOPY    PACEMAKER      PACEMAKER PLACEMENT      TOTAL HIP ARTHROPLASTY Right 01/2016    UPPER GASTROINTESTINAL ENDOSCOPY N/A 04/07/2022    EGD ESOPHAGOGASTRODUODENOSCOPY performed by Skeet Simmer, MD at [facility] ENDOSCOPY     Immunizations:                Influenza:  Ordered            Pneumococcal Polysaccharide:  Ordered    Medications Prior to Admission:    Medications Prior to Admission: midodrine (PROAMATINE) 10 MG tablet, Take 1 tablet by mouth 3 times daily (with meals)  phosphorus (K PHOS NEUTRAL) 155-852-130 MG tablet, Take 2 tablets by mouth 4 times daily for 5  days  aspirin 81 MG EC tablet, Take 1 tablet by mouth daily  ferrous sulfate (IRON 325) 325 (65 Fe) MG tablet, Take 1 tablet by mouth daily (with breakfast)  cholestyramine (QUESTRAN) 4 g packet, Take 1 packet by mouth 3 times daily (with meals) for 7 days  VORTIoxetine HBr (TRINTELLIX) 20 MG TABS tablet, Take 1 tablet by mouth daily  fluticasone-umeclidin-vilant (TRELEGY ELLIPTA) 100-62.5-25 MCG/ACT AEPB inhaler, Inhale 1 puff into the lungs daily  alendronate (FOSAMAX) 70 MG tablet, Take 1 tablet by mouth every 7 days On Wednesday  albuterol sulfate HFA (PROVENTIL;VENTOLIN;PROAIR) 108 (90 Base) MCG/ACT inhaler, Inhale 2 puffs into the lungs every 6 hours as needed  amiodarone (CORDARONE) 200 MG tablet, Take 1 tablet by mouth daily  carbidopa-levodopa (SINEMET CR) 50-200 MG per extended release tablet, Take 2 tablets by mouth 3 times daily  JARDIANCE 10 MG tablet, Take 1 tablet by mouth daily  gabapentin (NEURONTIN) 100 MG capsule, Take 1 capsule by mouth in the morning and at bedtime.  omeprazole (PRILOSEC) 40 MG delayed release capsule, Take 1 capsule by mouth daily  rosuvastatin (CRESTOR) 20 MG tablet, Take 1 tablet by  mouth daily    Allergies:  Patient has no known allergies.    Social History:       Family History:   No family history on file.  REVIEW OF SYSTEMS:  CONSTITUTIONAL:  negative  EYES:  negative  HEENT:  negative  RESPIRATORY:  negative  CARDIOVASCULAR:  negative  GASTROINTESTINAL:  negative  GENITOURINARY:  negative  INTEGUMENT/BREAST:  negative  HEMATOLOGIC/LYMPHATIC:  negative  ALLERGIC/IMMUNOLOGIC:  negative  ENDOCRINE:  negative  MUSCULOSKELETAL:  negative  NEUROLOGICAL:  negative  BEHAVIOR/PSYCH:  positive for depressed mood and fatigue  PHYSICAL EXAM:    Vitals:  BP (!) 114/52   Pulse 69   Temp 98.2 F (36.8 C) (Axillary)   Resp 20   Ht 1.803 m (5\' 11" )   Wt 74.5 kg (164 lb 5.3 oz)   SpO2 99%   BMI 22.92 kg/m     CONSTITUTIONAL:  awake, alert, cooperative, no apparent distress, and  appears stated age  EYES:  Lids and lashes normal, pupils equal, round and reactive to light, extra ocular muscles intact, sclera clear, conjunctiva normal  ENT:  Normocephalic, without obvious abnormality, atraumatic, sinuses nontender on palpation, external ears without lesions, oral pharynx with moist mucus membranes, tonsils without erythema or exudates, gums normal and good dentition.  NECK:  Supple, symmetrical, trachea midline, no adenopathy, thyroid symmetric, not enlarged and no tenderness, skin normal  HEMATOLOGIC/LYMPHATICS:  no cervical lymphadenopathy  BACK:  Symmetric, no curvature, spinous processes are non-tender on palpation, paraspinous muscles are non-tender on palpation, no costal vertebral tenderness  LUNGS:  No increased work of breathing, good air exchange, clear to auscultation bilaterally, no crackles or wheezing  CARDIOVASCULAR:  Normal apical impulse, regular rate and rhythm, normal S1 and S2, no S3 or S4, and no murmur noted  ABDOMEN:  No scars, normal bowel sounds, soft, non-distended, non-tender, no masses palpated, no hepatosplenomegally  CHEST/BREASTS:  Breasts symmetrical, skin without lesion(s), no nipple retraction or dimpling, no nipple discharge, no masses palpated, no axillary or supraclavicular adenopathy  GENITAL/URINARY:    MUSCULOSKELETAL:  There is no redness, warmth, or swelling of the joints.  Full range of motion noted.  Motor strength is 5 out of 5 all extremities bilaterally.  Tone is normal.  NEUROLOGIC:  Awake, alert, oriented to name, place and time.  Cranial nerves II-XII are grossly intact.  Motor is 5 out of 5 bilaterally.  Cerebellar finger to nose, heel to shin intact.  Sensory is intact.  Babinski down going, Romberg negative, and gait is normal.  SKIN:  no bruising or bleeding    DATA:  .RENAL        ASSESSMENT AND PLAN:      Principal Problem:    Acute kidney injury (AKI) with acute tubular necrosis (ATN) (HCC)  Severe hypokalemia  Hypocalcemia  Severe  malnutrition  Anemia  Possible UTI   hematuria  Plan: Continue with IV bicarb drip obtain nephrology consult empiric IV antibiotics obtain retroperitoneal ultrasound critical care time of 50 minutes

## 2022-04-26 NOTE — Progress Notes (Signed)
Writer unable to reconcile med list at this time, patient does not know the names of his medicines.

## 2022-04-26 NOTE — Care Coordination-Inpatient (Signed)
Patient currently admitted. Writer will sign off.

## 2022-04-26 NOTE — Consults (Signed)
[facility] Renal Consult Note      NAME:  Carlos Petersen   DOB:   09/14/1950   MRN:  [id number]     Requesting Physician Blake Divine, MD   Reason for Consult:  AKI     PCP:  Herma Mering, MD     Date/Time:  2022/05/06 12:44 PM          Subjective:     CHIEF COMPLAINT: malaise     HISTORY OF PRESENT ILLNESS:     Carlos Petersen is a 71 y.o.  Caucasian male who is admitted to the medical Service with malaise and failure to thrive.  We are asked to evaluate for AKI.      RECENT D/C FROM [facility] 04-12-22:  Suspected acute GI bleed with acute blood loss anemia- EGD/Colon neg  New diagnosis cirrhosis with splenomegaly  Thrombocytopenia  Electrolyte imbalances  DVT but poor anticoagulation candidate  Previous history of A-fib  Parkinson's disease  Chronic hypertension    Patient notes decrease appetite with little oral intake over the last week.  Also with loose stools overl last week. No vomiting, no abd pain, no fever or chills. Previous serum creatinines were usually <0.7 and on admit was 3.46.  Past Medical History:   Diagnosis Date    Cerebral artery occlusion with cerebral infarction [facility])     COPD (chronic obstructive pulmonary disease) (HCC)     Depression     Diabetes (HCC)     Emphysema lung (HCC)     Heart disease     Hypercholesterolemia     Parkinson disease (HCC)     Pulmonary embolism (HCC)         Past Surgical History:   Procedure Laterality Date    CARDIAC DEFIBRILLATOR PLACEMENT      COLONOSCOPY      COLONOSCOPY N/A 04/07/2022    COLONOSCOPY DIAGNOSTIC performed by Skeet Simmer, MD at [facility]OSCOPY    COLONOSCOPY N/A 04/07/2022    COLONOSCOPY POLYPECTOMY SNARE/COLD BIOPSY performed by Skeet Simmer, MD at [facility]OS[facility]    COLONOSCOPY N/A 04/09/2022    COLONOSCOPY POLYPECTOMY REMOVAL SNARE/STOMA performed by Skeet Simmer, MD at [facility] ENDOSCOPY    PACEMAKER      PACEMAKER PLACEMENT      TOTAL HIP ARTHROPLASTY Right 01/2016    UPPER GASTROINTESTINAL ENDOSCOPY N/A 04/07/2022    EGD  ESOPHAGOGASTRODUODENOSCOPY performed by Skeet Simmer, MD at [facility] ENDOSCOPY       Social History     Tobacco Use    Smoking status: Every Day     Packs/day: 0.50     Types: Cigarettes    Smokeless tobacco: Never   Substance Use Topics    Alcohol use: Never        No family history on file.     No Known Allergies     Prior to Admission medications    Medication Sig Start Date End Date Taking? Authorizing Provider   midodrine (PROAMATINE) 10 MG tablet Take 1 tablet by mouth 3 times daily (with meals) 04/12/22 05/12/22  Sami Simona Huh, MD   phosphorus (K PHOS NEUTRAL) [phone] MG tablet Take 2 tablets by mouth 4 times daily for 5 days 04/12/22 04/17/22  Sami Simona Huh, MD   aspirin 81 MG EC tablet Take 1 tablet by mouth daily 04/12/22   Sami Simona Huh, MD   ferrous sulfate (IRON 325) 325 (65 Fe) MG tablet Take 1 tablet by mouth daily (with breakfast) 02/01/22 03/03/22  Jan A Zarefoss, APRN - NP   cholestyramine (QUESTRAN) 4 g packet Take 1 packet by mouth 3 times daily (with meals) for 7 days 01/27/22 02/03/22  Ahmed I Donzetta Sprung, MD   VORTIoxetine HBr (TRINTELLIX) 20 MG TABS tablet Take 1 tablet by mouth daily    Historical Provider, MD   fluticasone-umeclidin-vilant (TRELEGY ELLIPTA) 100-62.5-25 MCG/ACT AEPB inhaler Inhale 1 puff into the lungs daily 11/17/21   Ar Automatic Reconciliation   alendronate (FOSAMAX) 70 MG tablet Take 1 tablet by mouth every 7 days On Wednesday    Historical Provider, MD   albuterol sulfate HFA (PROVENTIL;VENTOLIN;PROAIR) 108 (90 Base) MCG/ACT inhaler Inhale 2 puffs into the lungs every 6 hours as needed    Historical Provider, MD   amiodarone (CORDARONE) 200 MG tablet Take 1 tablet by mouth daily 11/17/21   Historical Provider, MD   carbidopa-levodopa (SINEMET CR) 50-200 MG per extended release tablet Take 2 tablets by mouth 3 times daily 11/17/21 11/12/22  Historical Provider, MD   JARDIANCE 10 MG tablet Take 1 tablet by mouth daily 11/18/21   Historical Provider, MD   gabapentin (NEURONTIN) 100 MG capsule Take 1  capsule by mouth in the morning and at bedtime. 12/23/21   Historical Provider, MD   omeprazole (PRILOSEC) 40 MG delayed release capsule Take 1 capsule by mouth daily    Historical Provider, MD   rosuvastatin (CRESTOR) 20 MG tablet Take 1 tablet by mouth daily 11/27/21 11/22/22  Historical Provider, MD         Current Facility-Administered Medications:     cefTRIAXone (ROCEPHIN) 1,000 mg in sterile water 10 mL IV syringe, 1,000 mg, IntraVENous, Q24H, Raphael I Agada, MD    sodium chloride flush 0.9 % injection 5-40 mL, 5-40 mL, IntraVENous, 2 times per day, Blake Divine, MD, 10 mL at 04-27-22 0935    sodium chloride flush 0.9 % injection 5-40 mL, 5-40 mL, IntraVENous, PRN, Blake Divine, MD    0.9 % sodium chloride infusion, , IntraVENous, PRN, Blake Divine, MD    enoxaparin Sodium (LOVENOX) injection 30 mg, 30 mg, SubCUTAneous, Daily, Blake Divine, MD, 30 mg at 27-Apr-2022 0932    ondansetron (ZOFRAN-ODT) disintegrating tablet 4 mg, 4 mg, Oral, Q8H PRN **OR** ondansetron (ZOFRAN) injection 4 mg, 4 mg, IntraVENous, Q6H PRN, Blake Divine, MD    acetaminophen (TYLENOL) tablet 650 mg, 650 mg, Oral, Q6H PRN **OR** acetaminophen (TYLENOL) suppository 650 mg, 650 mg, Rectal, Q6H PRN, Blake Divine, MD    sodium bicarbonate 150 mEq in sodium chloride 0.45 % 1,000 mL infusion, , IntraVENous, Continuous, Blake Divine, MD, Last Rate: 150 mL/hr at 04/27/2022 0934, Rate Change at 04/27/22 0934    metronidazole (FLAGYL) 500 mg in 0.9% NaCl 100 mL IVPB premix, 500 mg, IntraVENous, Q8H, Blake Divine, MD, Last Rate: 100 mL/hr at April 27, 2022 0933, 500 mg at 04/27/22 4166      Review of Systems:  Constitutional: positive for anorexia, fatigue, malaise, and weight loss, negative for chills, fevers, and sweats  Respiratory: negative for chronic bronchitis, cough, hemoptysis, shortness of breath, and wheezing  Cardiovascular: negative for chest pain, dyspnea, irregular heart beat, lower extremity edema, orthopnea, syncope,  and tachypnea  Gastrointestinal: positive for change in bowel habits and diarrhea, negative for abdominal pain, dyspepsia, nausea, and vomiting  Genitourinary:negative for dysuria, hematuria, and urinary incontinence  Neurological: negative       Objective:      VITALS:    Vital signs reviewed;  most recent are:    Vitals:    04/30/2022 0751   BP: (!) 114/52   Pulse: 69   Resp: 20   Temp: 98.2 F (36.8 C)   SpO2: 99%     SpO2 Readings from Last 6 Encounters:   04-30-2022 99%   04/12/22 96%   02/01/22 98%   01/22/22 93%   01/15/22 97%          Intake/Output Summary (Last 24 hours) at 2022/04/30 1244  Last data filed at 2022-04-30 1156  Gross per 24 hour   Intake 240 ml   Output 200 ml   Net 40 ml            Exam:   Physical Exam:General appearance: appears stated age, cooperative, fatigued, and no distress  Head: Normocephalic, without obvious abnormality, atraumatic  Eyes:  anicteric  Neck: no adenopathy, no JVD, supple, symmetrical, trachea midline, and thyroid not enlarged, symmetric, no tenderness/mass/nodules  Lungs: clear to auscultation bilaterally  Heart: regular rate and rhythm, S1, S2 normal, no murmur, click, rub or gallop, regular rate and rhythm, and no S3 or S4  Abdomen: soft, non-tender; bowel sounds normal; no masses,  no organomegaly  Extremities:  no edema  Neurologic: Grossly normal      LABS:  Recent Labs     04/30/22  0708 04/25/22  2114 04/12/22  0507 04/10/22  0630   NA 141 141 138 139   K 2.5* 2.4* 3.0* 3.6   CL 111* 111* 108 109*   CO2 20* 19* 24 26   BUN 27* 25* 7 6   CREATININE 3.50* 3.46* 0.58* 0.51*   CALCIUM 6.6* 6.3* 7.2* 7.1*   LABALBU 1.5* 1.5* 1.6* 1.7*   PHOS 4.7  --  3.0 2.4*   MG 2.1  --  1.8 2.0     Recent Labs     2022-04-30  0708 04/25/22  2114 04/12/22  0507   WBC 4.9 6.8 2.9*   HGB 7.7* 7.7* 7.7*   HCT 24.7* 24.8* 24.4*   PLT 112* 121* 84*     No results for input(s): POCGLU in the last 720 hours.  No results for input(s): PH, PCO2, PO2, HCO3, FIO2 in the last 720  hours.  @LABRCNT30day (INR:1)  No results for input(s): NAU, KU, CLU, CREAU in the last 720 hours.    Invalid input(s): PROU    Radiology  CT ABDOMEN PELVIS WO CONTRAST Additional Contrast? None    Result Date: 04/25/2022  1. Diffuse colitis. 2. 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 3. New mild to moderate right pleural effusion with bibasilar atelectasis. New 14 mm left lower lobe nodular lung opacity. Recommend follow-up in 3 months. 4. Refer to above findings for complete details.     XR CHEST PORTABLE    Result Date: 04/25/2022  Small right pleural effusion with underlying atelectasis.          No results found for this or any previous visit.         Assessment:   Renal Specific Problems  AKI  Hypokalemia  Metabolic acidosis  Significant diarrhea > 10 days  Anorexia with volume depletion        Plan:     Obtain/ Order: labs/cultures/radiology/procedures:  urine lytes and urine creatinine or spot urine protein and creatinine ratio and renal panel and Mg in AM    Will hold with renal 04/27/2022 since patient with AKI but has foley      Therapeutic:  Adjust IVF to give KCL  Oral K and NaHCO3  Stool studies     Risk of deterioration: low      Total time spent with patient: 30 Minutes                  Discussed:   patient           ___________________________________________________    Attending Physician: Dala Dock, MD

## 2022-04-26 NOTE — Progress Notes (Signed)
Bedside shift report was given to Eloise, RN.

## 2022-04-26 NOTE — Wound Image (Signed)
Patient off unit for testing.  WCN will follow up this afternoon.

## 2022-04-26 NOTE — Plan of Care (Signed)
Problem: Metabolic/Fluid and Electrolytes - Adult  Goal: Electrolytes maintained within normal limits  Outcome: HH/HSPC Progressing     Problem: Skin/Tissue Integrity  Goal: Absence of new skin breakdown  Description: 1.  Monitor for areas of redness and/or skin breakdown  2.  Assess vascular access sites hourly  3.  Every 4-6 hours minimum:  Change oxygen saturation probe site  4.  Every 4-6 hours:  If on nasal continuous positive airway pressure, respiratory therapy assess nares and determine need for appliance change or resting period.  Outcome: HH/HSPC Progressing     Problem: Safety - Adult  Goal: Free from fall injury  Outcome: HH/HSPC Progressing

## 2022-04-26 NOTE — Progress Notes (Signed)
Problem: Physical Therapy - Adult  Goal: By Discharge: Performs mobility at highest level of function for planned discharge setting.  See evaluation for individualized goals.  Description: FUNCTIONAL STATUS PRIOR TO ADMISSION: The patient  required moderate assistance for basic and instrumental ADLs. and The patient was functional at the wheelchair level and required moderate assistancefor transfers to the chair.    HOME SUPPORT PRIOR TO ADMISSION: pt has been at [facility] x 3 weeks but has not started to work with therapy due to illness    Physical Therapy Goals  Initiated April 27, 2022  Pt stated goal: to get better again  Pt will be I with LE HEP in 7 days.  Pt will perform bed mobility with Contact Guard Assist in 7 days.  Pt will perform transfers with Contact Guard Assist in 7 days.   Pt will demonstrate improvement in standing balance from Moderate Assist and Assist x2 to Contact Guard Assist in 7 days.      Outcome: Progressing       PHYSICAL THERAPY EVALUATION  Patient: Carlos Petersen (71 y.o. male)  Date: 04-27-22  Primary Diagnosis: Hypokalemia [E87.6]  Colitis [K52.9]  Acute kidney injury (HCC) [N17.9]  Acute kidney injury (AKI) with acute tubular necrosis (ATN) (HCC) [N17.0]       Precautions: Restrictions/Precautions  Restrictions/Precautions: Fall Risk (enteric-cdiff r/o)     In place during session: Peripheral IV and External Catheter  ASSESSMENT  Pt is a 72 y.o. male admitted on 04/25/2022 for not feeling well; pt currently being treated for AKI with possible acute tubular necrosis, severe hypokalemia, hypocalcemia, severe malnutrition, anemia, possible UTI, and r/o c-diff. Pt semi-supine in bed upon PT arrival, agreeable to evaluation. Pt A&O x 4.     Based on the objective data described below, the patient currently presents with impaired functional mobility, decreased independence in ADLs, decreased ROM, impaired strength, impaired balance, impaired posture, and increased pain levels. (See  below for objective details and assist levels).     Overall pt tolerated session fair today with c/o left hip pain not rated throughout session. Pt required min to mod A x2 for bed mobility and transfers. Pt completed sit to stand transfers with mod A x2, RW, and gt belt able to clear buttock from bed but was unable to perform full extension of bilateral knees even with assistance. Pt returned to EOB and completed LE therex, see below for details. Pt required min A to return to supine position and to scoot HOB. Pt will benefit from continued skilled PT to address above deficits and return to PLOF. Current PT DC recommendation Skilled Nursing Facility once medically appropriate.    Current Level of Function Impacting Discharge (mobility/balance): min to mod A x2    Other factors to consider for discharge: high risk for falls     PLAN :  Recommendations and Planned Interventions: bed mobility training, transfer training, gait training, therapeutic exercises, patient and family training/education, and therapeutic activities    Recommend for staff: Encourage HEP in prep for ADLs/mobility and Frequent repositioning to prevent skin breakdown    Frequency/Duration: Patient will be followed by physical therapy:  2-3x/week to address goals.    Recommendation for discharge: (in order for the patient to meet his/her long term goals)  Skilled Nursing Facility    IF patient discharges home will need the following DME: continuing to assess with progress         SUBJECTIVE:   Patient stated "I haven't walked since April  after I broke my hip."    OBJECTIVE DATA SUMMARY:   HISTORY:    Past Medical History:   Diagnosis Date    Cerebral artery occlusion with cerebral infarction (HCC)     COPD (chronic obstructive pulmonary disease) (HCC)     Depression     Diabetes (HCC)     Emphysema lung (HCC)     Heart disease     Hypercholesterolemia     Parkinson disease (HCC)     Pulmonary embolism (HCC)      Past Surgical History:   Procedure  Laterality Date    CARDIAC DEFIBRILLATOR PLACEMENT      COLONOSCOPY      COLONOSCOPY N/A 04/07/2022    COLONOSCOPY DIAGNOSTIC performed by Skeet Simmer, MD at [facility] ENDOSCOPY    COLONOSCOPY N/A 04/07/2022    COLONOSCOPY POLYPECTOMY SNARE/COLD BIOPSY performed by Skeet Simmer, MD at [facility]OSCOPY    COLONOSCOPY N/A 04/09/2022    COLONOSCOPY POLYPECTOMY REMOVAL SNARE/STOMA performed by Skeet Simmer, MD at [facility] ENDOSCOPY    PACEMAKER      PACEMAKER PLACEMENT      TOTAL HIP ARTHROPLASTY Right 01/2016    UPPER GASTROINTESTINAL ENDOSCOPY N/A 04/07/2022    EGD ESOPHAGOGASTRODUODENOSCOPY performed by Skeet Simmer, MD at [facility] ENDOSCOPY       Home Situation:  Social/Functional History  Type of Home: Facility    Cognitive/Behavioral Status:  Orientation  Orientation Level: Oriented X4  Cognition  Overall Cognitive Status: WNL    Skin: intact where visible    Edema: none noted     Strength:    Strength: Grossly decreased, non-functional    Range Of Motion:  AROM: Grossly decreased, non-functional  PROM: Grossly decreased, non-functional    Functional Mobility:  Bed Mobility:     Bed Mobility Training  Bed Mobility Training: Yes  Rolling: Contact-guard assistance  Supine to Sit: Minimum assistance;Assist X2  Sit to Supine: Minimum assistance;Additional time  Scooting: Minimum assistance;Additional time  Transfers:     Art therapist: Yes  Sit to Stand: Moderate assistance;Assist X2;Additional time  Stand to Sit: Moderate assistance;Assist X2;Additional time  Balance:               Balance  Sitting: Intact  Standing: Impaired  Standing - Static: Constant support;Fair  Standing - Dynamic:  (unable to advance feet)    Ambulation/Gait Training:  Tax inspector:  (attempted but unable to advance feet)    Therapeutic Exercises:   Pt educated on LE HEP to include seated LAQ, marching, supine bridge 10x each, instructed to complete throughout the day to improve ROM and strength with good understanding verbalized and  demonstrated.      Functional Measure:  [facility] AM-PACT "6 Clicks"         Basic Mobility Inpatient Short Form  How much difficulty does the patient currently have... Unable A Lot A Little None   1.  Turning over in bed (including adjusting bedclothes, sheets and blankets)?   []  1   [x]  2   []  3   []  4   2.  Sitting down on and standing up from a chair with arms ( e.g., wheelchair, bedside commode, etc.)   []  1   [x]  2   []  3   []  4   3.  Moving from lying on back to sitting on the side of the bed?   []  1   [x]  2   []  3   []   4          How much help from another person does the patient currently need... Total A Lot A Little None   4.  Moving to and from a bed to a chair (including a wheelchair)?   []  1   [x]  2   []  3   []  4   5.  Need to walk in hospital room?   []  1   [x]  2   []  3   []  4   6.  Climbing 3-5 steps with a railing?   [x]  1   []  2   []  3   []  4    2007, Trustees of , under license to [employer], [place]. All rights reserved     Score:  Initial: 11/24 Most Recent: X (Date: 2022/05/18 )   Interpretation of Tool:  Represents activities that are increasingly more difficult (i.e. Bed mobility, Transfers, Gait).  Score 24 23 22-20 19-15 14-10 9-7 6   Modifier CH CI CJ CK CL CM CN         Physical Therapy Evaluation Charge Determination   History Examination Presentation Decision-Making   HIGH Complexity :3+ comorbidities / personal factors will impact the outcome/ POC  HIGH Complexity : 4+ Standardized tests and measures addressing body structure, function, activity limitation and / or participation in recreation  LOW Complexity : Stable, uncomplicated  Other outcome measures ampac 6  MEDIUM      Based on the above components, the patient evaluation is determined to be of the following complexity level: LOW    Pain Rating:  C/o left hip pain but not rated on VAS, 6/10 FACES  Pain Intervention(s):   nursing notified, rest, and repositioning    Activity Tolerance:   Fair  and requires rest  breaks    After treatment patient left in no apparent distress:   Bed locked and in lowest position Patient left in no apparent distress in bed, Call bell within reach, Bed/ chair alarm activated, and Side rails x3 and nsg updated.    COMMUNICATION/EDUCATION:   The patient's plan of care was discussed with: Occupational therapist, Registered nurse, and Case management     Patient Education  Education Given To: Patient  Education Provided: Role of Therapy;Plan of Care;Home Exercise ;Fall Prevention Strategies;Energy Conservation  Education Method: Demonstration;Verbal  Barriers to Learning: None  Education Outcome: Verbalized understanding;Demonstrated understanding    PT/OT sessions occurred together for increased safety of pt and clinician.     Thank you for this referral.  , PT, DPT  Minutes: 25

## 2022-04-26 NOTE — Progress Notes (Signed)
11:30 returned from [employer]. Incontinent of loose stool, specimen collected. Incontinent care give,  open area to sacrum, redness to buttocks and bilateral legs, purple red flat  spots noted to left posterior  upper leg. dressing to right arm intact.  12:00 patient incontinent of lamedium amount of loose stool, foul smelling.  12:15 Stool taken to lab. Wound care nurse at bedside.

## 2022-04-26 NOTE — Care Coordination-Inpatient (Addendum)
Apr 29, 2022 1604   Service Assessment   Patient Orientation Alert and Oriented;Person   Cognition Short Term Memory Deficit   History Provided By Child/Family  (Son Valere Dross.)   Primary Caregiver Other (Comment)  (Facility staff.)   Accompanied By/Relationship Pt alone during visit. CM spok with son Patty Lopezgarcia) via phone.   Support Systems Children   Patient's Healthcare Decision Maker is: Armed forces operational officer Next of Kin   PCP Verified by CM Yes  Glade Lloyd Mohiuddin - seen @ the facility.)   Last Visit to PCP Within last 3 months   Prior Functional Level Assistance with the following:;Bathing;Dressing;Toileting;Cooking;Housework;Shopping;Mobility  (W/C)   Current Functional Level Assistance with the following:;Bathing;Dressing;Toileting;Cooking;Housework;Shopping;Mobility  (W/C)   Can patient return to prior living arrangement Other (see comment)  (Son wants new SNF.)   Ability to make needs known: Fair   Family able to assist with home care needs: Yes   Would you like for me to discuss the discharge plan with any other family members/significant others, and if so, who? Yes  (Son Jakyle Petrucelli)   [facility] None   Social/Functional History   Lives With Other (comment)  Engineer, building services)   Type of Home Facility  (From [facility]/son [facility].)   Home Layout One level   Home Access Level entry   [employer] Shower/Tub Walk-in shower   Bathroom Toilet Handicap height   Bathroom Equipment Grab bars in shower;Grab bars around toilet   Psychologist, sport and exercise accessible   Home Equipment Wheelchair-manual   Receives Help From Family;Other (comment)  Cabin crew.)   ADL Assistance Needs assistance   Toileting Needs assistance   Homemaking Assistance Needs assistance   Homemaking Responsibilities Yes   Ambulation Assistance Non-ambulatory  (W/C)   Transfer Assistance Needs assistance   Active Driver No   Occupation Retired   Building control surveyor   Type of Residence Skilled Nursing Facility  ([place]) verbally consented  to Choice Letter for [employer] .)   Living Arrangements Other (Comment)  Engineer, building services)   Current Services Prior To Admission Skilled Nursing Facility;Transportation   Potential Assistance Needed Skilled Nursing Facility;Transportation   DME Ordered? No   Potential Assistance Purchasing Medications No   Type of Home Care Services None   Patient expects to be discharged to: Skilled nursing facility   One/Two Story Residence One story   History of falls? 1   Services At/After Discharge   Transition of Care Consult (CM Consult) Discharge Planning;SNF   Partner SNF No   Reason Why Partner SNF Not Chosen Location   Services At/After Discharge Transport;Skilled Nursing Facility (SNF)   [employer] Information Provided? No   Mode of Transport at Discharge BLS   Confirm Follow Up Transport Other (see comment)     Readmission Assessment  Number of Days since last admission?: 8-30 days  Previous Disposition: SNF (From [facility] - son wants [facility]. Referral.)  Who is being Interviewed: Caregiver (Son Leah Skora.)  What was the patient's/caregiver's perception as to why they think they needed to return back to the hospital?: Other (Comment) (Weakness.)  Did you visit your Primary Care Physician after you left the hospital, before you returned this time?: Yes (Mohiuddin, Abdul . Seen @ the facility .)  Did you see a specialist, such as Cardiac, Pulmonary, Orthopedic Physician, etc. after you left the hospital?: No  Who advised the patient to return to the hospital?: Skilled Unit (Facility staff)  Does the patient report anything that got in the way of taking their medications?: No  In our efforts to provide the best possible care to you and others like you, can you think of anything that we could have done to help you after you left the hospital the first time, so that you might not have needed to return so soon?: Other (Comment) (No suggestions.)    CM spoke with pt's son Iris Tatsch via phone @  [phone]) & D/C Plan is another facility - son does not want pt to return to [facility] - stated [facility] was closer to his also. Son Itzael Liptak) verbally consented to Choice Letter & referral sent via [employer]. D/C Plan is [facility] via transportation.  Pt needs assist with ADLs & uses W/C.

## 2022-04-26 NOTE — Progress Notes (Signed)
Writer is unable to collect stool sample for c diff testing at this time, patient had 1 stool count but it was already absorbed in diaper when he came from the Emergency Room.

## 2022-04-26 NOTE — Care Coordination-Inpatient (Signed)
Patient currently admitted to [facility]. Writer will sign off.

## 2022-04-26 NOTE — Progress Notes (Signed)
Hospitalist Progress Note  [facility]    Subjective:   Daily Progress Note: 04/05/2022 6:36 PM    Patient is positive for C. difficile colitis.  Has severe hypokalemia r nephrology evaluation pending    Current Facility-Administered Medications   Medication Dose Route Frequency    cefTRIAXone (ROCEPHIN) 1,000 mg in sterile water 10 mL IV syringe  1,000 mg IntraVENous Q24H    potassium chloride 20 mEq, sodium bicarbonate 50 mEq in sodium chloride 0.45 % 1,000 mL infusion   IntraVENous Continuous    nystatin (MYCOSTATIN) ointment   Topical BID    zinc oxide 40 % paste   Topical BID    vancomycin (FIRVANQ) 50 MG/ML oral solution 250 mg  250 mg Oral 4 times per day    saccharomyces boulardii (FLORASTOR) capsule 250 mg  250 mg Oral BID    potassium chloride (KLOR-CON) extended release tablet 40 mEq  40 mEq Oral Q4H    sodium chloride flush 0.9 % injection 5-40 mL  5-40 mL IntraVENous 2 times per day    sodium chloride flush 0.9 % injection 5-40 mL  5-40 mL IntraVENous PRN    0.9 % sodium chloride infusion   IntraVENous PRN    enoxaparin Sodium (LOVENOX) injection 30 mg  30 mg SubCUTAneous Daily    ondansetron (ZOFRAN-ODT) disintegrating tablet 4 mg  4 mg Oral Q8H PRN    Or    ondansetron (ZOFRAN) injection 4 mg  4 mg IntraVENous Q6H PRN    acetaminophen (TYLENOL) tablet 650 mg  650 mg Oral Q6H PRN    Or    acetaminophen (TYLENOL) suppository 650 mg  650 mg Rectal Q6H PRN    metronidazole (FLAGYL) 500 mg in 0.9% NaCl 100 mL IVPB premix  500 mg IntraVENous Q8H        Review of Systems  Constitutional: negative for fevers, chills, sweats, and fatigue  Eyes: negative for redness and icterus  Ears, nose, mouth, throat, and face: negative for ear drainage, nasal congestion, sore throat, and voice change  Respiratory: negative for cough, wheezing, or dyspnea on exertion  Cardiovascular: negative for chest pain, dyspnea, palpitations, lower extremity edema  Gastrointestinal: negative for nausea, vomiting,  diarrhea, and abdominal pain  Genitourinary:negative for frequency, dysuria, and hematuria  Integument/breast: negative for skin lesion(s) and dryness  Hematologic/lymphatic: negative for easy bruising, bleeding, and lymphadenopathy  Musculoskeletal:negative for neck pain, back pain, and muscle weakness  Neurological: negative for headaches, dizziness, and weakness  Behavioral/Psych: negative for anxiety and depression  Allergic/Immunologic: negative for urticaria and angioedema        Objective:     BP 117/65   Pulse 70   Temp 97.5 F (36.4 C) (Oral)   Resp 18   Ht 1.803 m (5' 10.98")   Wt 74.5 kg (164 lb 5.3 oz)   SpO2 100%   BMI 22.93 kg/m         Temp (24hrs), Avg:97.9 F (36.6 C), Min:97.5 F (36.4 C), Max:98.2 F (36.8 C)      08/28 0701 - 08/28 1900  In: 1213.3 [P.O.:680; I.V.:405]  Out: 200 [Urine:200]  No intake/output data recorded.    PHYSICAL EXAM:  Skin: Extremities and face reveal no rashes.   HEENT: Sclerae anicteric. Extra-occular muscles are intact. No oral ulcers. No ENT discharge. The neck is supple.   Cardiovascular: Regular rate and rhythm. No murmurs, gallops, or rubs. PMI nondisplaced.   Respiratory:  Clear breath sounds bilaterally with no wheezes, rales, or  rhonchi.   GI: Abdomen nondistended, soft, and nontender. Normal active bowel sounds. No enlargement of the liver or spleen. No masses palpable.   Rectal: Deferred   Musculoskeletal: No pitting edema of the lower legs. Extremities have good range of motion. No costovertebral tenderness.   Neurological:  Patient is alert and oriented. Cranial nerves II-XII grossly intact  Psychiatric: Mood appears appropriate with judgement intact.   Lymphatic: No cervical or supraclavicular adenopathy.    Additional comments:I reviewed the patient's new clinical lab test results.      Data Review    Recent Results (from the past 24 hour(s))   CBC with Auto Differential    Collection Time: 04/25/22  9:14 PM   Result Value Ref Range    WBC 6.8  4.1 - 11.1 K/uL    RBC 2.92 (L) 4.10 - 5.70 M/uL    Hemoglobin 7.7 (L) 12.1 - 17.0 g/dL    Hematocrit 24.8 (L) 36.6 - 50.3 %    MCV 84.9 80.0 - 99.0 FL    MCH 26.4 26.0 - 34.0 PG    MCHC 31.0 30.0 - 36.5 g/dL    RDW 24.2 (H) 11.5 - 14.5 %    Platelets 121 (L) 150 - 400 K/uL    MPV 8.9 8.9 - 12.9 FL    Nucleated RBCs 0.0 0.0 PER 100 WBC    nRBC 0.00 0.00 - 0.01 K/uL    Neutrophils % 66 32 - 75 %    Lymphocytes % 23 12 - 49 %    Monocytes % 9 5 - 13 %    Eosinophils % 1 0 - 7 %    Basophils % 0 0 - 1 %    Immature Granulocytes 1 (H) 0 - 0.5 %    Neutrophils Absolute 4.4 1.8 - 8.0 K/UL    Lymphocytes Absolute 1.6 0.8 - 3.5 K/UL    Monocytes Absolute 0.6 0.0 - 1.0 K/UL    Eosinophils Absolute 0.1 0.0 - 0.4 K/UL    Basophils Absolute 0.0 0.0 - 0.1 K/UL    Absolute Immature Granulocyte 0.0 0.00 - 0.04 K/UL    Differential Type AUTOMATED     CMP    Collection Time: 04/25/22  9:14 PM   Result Value Ref Range    Sodium 141 136 - 145 mmol/L    Potassium 2.4 (LL) 3.5 - 5.1 mmol/L    Chloride 111 (H) 97 - 108 mmol/L    CO2 19 (L) 21 - 32 mmol/L    Anion Gap 11 5 - 15 mmol/L    Glucose 84 65 - 100 mg/dL    BUN 25 (H) 6 - 20 mg/dL    Creatinine 3.46 (H) 0.70 - 1.30 mg/dL    Bun/Cre Ratio 7 (L) 12 - 20      Est, Glom Filt Rate 18 (L) >60 ml/min/1.32m    Calcium 6.3 (LL) 8.5 - 10.1 mg/dL    Total Bilirubin 0.5 0.2 - 1.0 mg/dL    AST 24 15 - 37 U/L    ALT <6 (L) 12 - 78 U/L    Alk Phosphatase 61 45 - 117 U/L    Total Protein 5.9 (L) 6.4 - 8.2 g/dL    Albumin 1.5 (L) 3.5 - 5.0 g/dL    Globulin 4.4 (H) 2.0 - 4.0 g/dL    Albumin/Globulin Ratio 0.3 (L) 1.1 - 2.2     Troponin    Collection Time: 04/25/22  9:14 PM   Result Value Ref  Range    Troponin, High Sensitivity 22 0 - 76 ng/L   Lipase    Collection Time: 04/25/22  9:14 PM   Result Value Ref Range    Lipase 46 (L) 73 - 393 U/L   Urinalysis with Microscopic    Collection Time: 04/13/2022  6:10 AM   Result Value Ref Range    Color, UA Amber      Appearance Turbid (A) Clear       Specific Gravity, UA 1.014 1.003 - 1.030      pH, Urine 5.0 5.0 - 8.0      Protein, UA 100 (A) Negative mg/dL    Glucose, UA Negative Negative mg/dL    Ketones, Urine 5 (A) Negative mg/dL    Bilirubin Urine Negative Negative      Blood, Urine Large (A) Negative      Urobilinogen, Urine 0.1 0.1 - 1.0 EU/dL    Nitrite, Urine Negative Negative      Leukocyte Esterase, Urine Negative Negative      WBC, UA 10-20 0 - 4 /hpf    RBC, UA 50-100 0 - 5 /hpf    Epithelial Cells UA Few Few /lpf    BACTERIA, URINE 1+ (A) Negative /hpf   Urinalysis    Collection Time: 04/22/2022  6:10 AM   Result Value Ref Range    Color, UA Amber      Appearance Turbid (A) Clear      Specific Gravity, UA 1.014 1.003 - 1.030      pH, Urine 5.0 5.0 - 8.0      Protein, UA 100 (A) Negative mg/dL    Glucose, UA Negative Negative mg/dL    Ketones, Urine Negative Negative mg/dL    Bilirubin Urine Negative Negative      Blood, Urine Large (A) Negative      Urobilinogen, Urine 0.1 0.1 - 1.0 EU/dL    Nitrite, Urine Negative Negative      Leukocyte Esterase, Urine Negative Negative      WBC, UA 10-20 0 - 4 /hpf    RBC, UA 50-100 0 - 5 /hpf    Epithelial Cells UA Few Few /lpf    BACTERIA, URINE 1+ (A) Negative /hpf    Hyaline Casts, UA 0-2 0 - 5 /lpf   Creatinine, urine, random - (Necessary for FENa calculation)    Collection Time: 04/13/2022  6:10 AM   Result Value Ref Range    Creatinine, Ur 126.00 mg/dL   Sodium, urine, random - (Necessary for FENa calculation)    Collection Time: 04/10/2022  6:10 AM   Result Value Ref Range    SODIUM, RANDOM URINE 9 mmol/L   Potassium, urine, random    Collection Time: 04/05/2022  6:10 AM   Result Value Ref Range    POTASSIUM, RANDOM URINE 16 mmol/L   Magnesium    Collection Time: 04/20/2022  7:08 AM   Result Value Ref Range    Magnesium 2.1 1.6 - 2.4 mg/dL   Renal Function Panel    Collection Time: 04/02/2022  7:08 AM   Result Value Ref Range    Sodium 141 136 - 145 mmol/L    Potassium 2.5 (LL) 3.5 - 5.1 mmol/L    Chloride 111 (H)  97 - 108 mmol/L    CO2 20 (L) 21 - 32 mmol/L    Anion Gap 10 5 - 15 mmol/L    Glucose 82 65 - 100 mg/dL    BUN 27 (H) 6 -  20 mg/dL    Creatinine 3.50 (H) 0.70 - 1.30 mg/dL    Bun/Cre Ratio 8 (L) 12 - 20      Est, Glom Filt Rate 18 (L) >60 ml/min/1.52m    Calcium 6.6 (L) 8.5 - 10.1 mg/dL    Phosphorus 4.7 2.6 - 4.7 mg/dL    Albumin 1.5 (L) 3.5 - 5.0 g/dL   CK    Collection Time: 04/03/2022  7:08 AM   Result Value Ref Range    Total CK 24 (L) 39 - 308 U/L   CBC with Auto Differential    Collection Time: 04/15/2022  7:08 AM   Result Value Ref Range    WBC 4.9 4.1 - 11.1 K/uL    RBC 2.92 (L) 4.10 - 5.70 M/uL    Hemoglobin 7.7 (L) 12.1 - 17.0 g/dL    Hematocrit 24.7 (L) 36.6 - 50.3 %    MCV 84.6 80.0 - 99.0 FL    MCH 26.4 26.0 - 34.0 PG    MCHC 31.2 30.0 - 36.5 g/dL    RDW 23.9 (H) 11.5 - 14.5 %    Platelets 112 (L) 150 - 400 K/uL    MPV 8.9 8.9 - 12.9 FL    Nucleated RBCs 0.0 0.0 PER 100 WBC    nRBC 0.00 0.00 - 0.01 K/uL    Neutrophils % 65 32 - 75 %    Lymphocytes % 23 12 - 49 %    Monocytes % 8 5 - 13 %    Eosinophils % 2 0 - 7 %    Basophils % 1 0 - 1 %    Immature Granulocytes 1 (H) 0 - 0.5 %    Neutrophils Absolute 3.1 1.8 - 8.0 K/UL    Lymphocytes Absolute 1.1 0.8 - 3.5 K/UL    Monocytes Absolute 0.4 0.0 - 1.0 K/UL    Eosinophils Absolute 0.1 0.0 - 0.4 K/UL    Basophils Absolute 0.1 0.0 - 0.1 K/UL    Absolute Immature Granulocyte 0.1 (H) 0.00 - 0.04 K/UL    Differential Type Smear Scanned      RBC Comment Anisocytosis  2+        RBC Comment Microcytosis  2+        RBC Comment Polychromasia  1+       Clostridium Difficile Toxin/Antigen    Collection Time: 04/17/2022 12:15 PM    Specimen: Stool   Result Value Ref Range    GDH Antigen Positive      C difficile Toxin, EIA Positive (A) Negative      C Diff Toxin Interpretation Positive for toxigenic C. difficile (A) Negative for toxigenic C. difficile           Assessment/Plan:     Principal Problem:    Acute kidney injury (AKI) with acute tubular necrosis (ATN)  (HCC)  Resolved Problems:    * No resolved hospital problems. *  Metabolic acidosis  C. difficile colitis      Care Plan discussed with: Patient/Family    Total time spent with patient: 35 minutes.

## 2022-04-26 NOTE — Progress Notes (Signed)
Comprehensive Nutrition Assessment    Type and Reason for Visit:  Initial, Positive Nutrition Screen (MST 3)    Nutrition Recommendations/Plan:   Advance diet as medically appropriate  Replete K, continue to monitor and replete prn   Ensure Enlive 1x/day  Juven 2x/day  Please document PO/ONS intake in I/Os     Malnutrition Assessment:  Malnutrition Status:  Severe malnutrition (05/20/22 1257)    Context:  Acute Illness     Findings of the 6 clinical characteristics of malnutrition:  Energy Intake:  50% or less of estimated energy requirements for 5 or more days (decreased intake x1-2 wks, minimal intakes PTA per pt report)  Weight Loss:  Greater than 7.5% over 3 months (~22% wt loss x 3 mo. per EMR; nutritionally significant wt loss per pt report x2-3 mo.)     Body Fat Loss:  Mild body fat loss (per observational NFPE r/t contact iso.) Orbital   Muscle Mass Loss:  Moderate muscle mass loss Temples (temporalis), Clavicles (pectoralis & deltoids) (per observational NFPE r/t contact iso.)  Fluid Accumulation:  No significant fluid accumulation    Grip Strength:  Not Performed    Nutrition Assessment:    Presents to ED from [facility] for evaluation of generalized weakness, decreased PO intake, and abdominal discomfort ongoing x 1 week. No substantial PO intake x 1 week, multiple episodes of loose stools, and lack of appetite per pt family per H&P. ~22% wt loss x 3 months per EMR, nutritionally significant if documented wt's are accurate (no method recorded/stated wt). Pt noted decreasing wt trends x 2-3 months (246# - 158#, 33% wt loss - nutritionally significant if accurate report). Pt reported decreased appetite/intakes x 1-2 wks. Limited PO intake PTA (few eggs/day per pt report). Pt on full liquid diet (coffee for B). Pt amenable to starting ONS to better meet EER and to promote wound healing. Plan to monitor intakes and adjust ONS rec'd prn. Rec'd to advance diet when medically appropriate. Labs: K  2.5, Cl 111, BUN 27, Creat 3.50, GFR 18, Ca 6.6, Alb 1.5, H&H 7.7/24.7. Meds: Sodium bicarb, Lovenox.    Nutrition Related Findings:    LBM 8/28, no edema noted, no hx of c/s issues - pt noted difficulty c/s issues only when w/o dentures. No N/V/D/C per pt report. NFPE deferred d/t contact iso.; observed moderate wasting per observational NFPE. Wound Type: Multiple, Pressure Injury, Stage II, Surgical Incision (Stage 2 PI (sacral), Incision (L thigh))       Current Nutrition Intake & Therapies:       Average Supplements Intake: None Ordered  ADULT DIET; Full Liquid  ADULT ORAL NUTRITION SUPPLEMENT; Lunch; Standard High Calorie/High Protein Oral Supplement  ADULT ORAL NUTRITION SUPPLEMENT; Breakfast, Dinner; Wound Healing Oral Supplement    Anthropometric Measures:  Height: 180.3 cm (5' 10.98")  Ideal Body Weight (IBW): 172 lbs (78 kg)       Current Body Weight: 164 lb 3.9 oz (74.5 kg), 95.5 % IBW. Weight Source: Bed Scale  Current BMI (kg/m2): 22.9        Weight Adjustment For: No Adjustment                 BMI Categories: Normal Weight (BMI 22.0 to 24.9) age over 32    Estimated Daily Nutrient Needs:  Energy Requirements Based On: Kcal/kg  Weight Used for Energy Requirements: Current  Energy (kcal/day): 1863 - 2235 kcals/day (25 - 30 kcals/kg)  Weight Used for Protein Requirements: Current  Protein (g/day): 89 - 104 g/day (1.2 - 1.4 - wound)  Method Used for Fluid Requirements: 1 ml/kcal  Fluid (ml/day): 1863 - 2235 ml/day (59ml/kcal)    Nutrition Diagnosis:   Severe malnutrition related to inadequate protein-energy intake as evidenced by Criteria as identified in malnutrition assessment    Nutrition Interventions:   Food and/or Nutrient Delivery: Continue Current Diet, Start Oral Nutrition Supplement  Nutrition Education/Counseling: No recommendation at this time  Coordination of Nutrition Care: Continue to monitor while inpatient  Plan of Care discussed with: pt    Goals:     Goals: PO intake 50% or greater, by  next RD assessment       Nutrition Monitoring and Evaluation:   Behavioral-Environmental Outcomes: None Identified  Food/Nutrient Intake Outcomes: Food and Nutrient Intake, Diet Advancement/Tolerance, Supplement Intake  Physical Signs/Symptoms Outcomes: Weight, Skin, Nutrition Focused Physical Findings, Meal Time Behavior    Discharge Planning:    Too soon to determine     Lavonna Monarch, RD  Contact: [phone]

## 2022-04-26 NOTE — Wound Image (Signed)
IP WOUND CONSULT    Carlos Petersen  MEDICAL RECORD NUMBER:  [id number]  AGE: 71 y.o.   GENDER: male  DOB: 12-Aug-1951  TODAY'S DATE:  2022-05-16    GENERAL     []  Follow-up   [x]  New Consult    Carlos Petersen is a 71 y.o. male referred by:   [x]  Physician  []  Nursing  []  Other:         PAST MEDICAL HISTORY    Past Medical History:   Diagnosis Date    Cerebral artery occlusion with cerebral infarction (HCC)     COPD (chronic obstructive pulmonary disease) (HCC)     Depression     Diabetes (HCC)     Emphysema lung (HCC)     Heart disease     Hypercholesterolemia     Parkinson disease (HCC)     Pulmonary embolism (HCC)         PAST SURGICAL HISTORY    Past Surgical History:   Procedure Laterality Date    CARDIAC DEFIBRILLATOR PLACEMENT      COLONOSCOPY      COLONOSCOPY N/A 04/07/2022    COLONOSCOPY DIAGNOSTIC performed by 60, MD at [facility]    COLONOSCOPY N/A 04/07/2022    COLONOSCOPY POLYPECTOMY SNARE/COLD BIOPSY performed by , MD at [facility]    COLONOSCOPY N/A 04/09/2022    COLONOSCOPY POLYPECTOMY REMOVAL SNARE/STOMA performed by 06/07/2022, MD at [facility]    PACEMAKER      PACEMAKER PLACEMENT      TOTAL HIP ARTHROPLASTY Right 01/2016    UPPER GASTROINTESTINAL ENDOSCOPY N/A 04/07/2022    EGD ESOPHAGOGASTRODUODENOSCOPY performed by 06/09/2022, MD at [facility] ENDOSCOPY       FAMILY HISTORY    No family history on file.      ALLERGIES    No Known Allergies    MEDICATIONS    No current facility-administered medications on file prior to encounter.     Current Outpatient Medications on File Prior to Encounter   Medication Sig Dispense Refill    midodrine (PROAMATINE) 10 MG tablet Take 1 tablet by mouth 3 times daily (with meals) 90 tablet 0    phosphorus (K PHOS NEUTRAL) 155-852-130 MG tablet Take 2 tablets by mouth 4 times daily for 5 days 30 tablet 0    aspirin 81 MG EC tablet Take 1 tablet by mouth daily 30 tablet 3    ferrous sulfate (IRON 325) 325 (65 Fe) MG tablet Take 1 tablet by mouth daily  (with breakfast) 30 tablet 0    cholestyramine (QUESTRAN) 4 g packet Take 1 packet by mouth 3 times daily (with meals) for 7 days 21 packet 0    VORTIoxetine HBr (TRINTELLIX) 20 MG TABS tablet Take 1 tablet by mouth daily      fluticasone-umeclidin-vilant (TRELEGY ELLIPTA) 100-62.5-25 MCG/ACT AEPB inhaler Inhale 1 puff into the lungs daily      alendronate (FOSAMAX) 70 MG tablet Take 1 tablet by mouth every 7 days On Wednesday      albuterol sulfate HFA (PROVENTIL;VENTOLIN;PROAIR) 108 (90 Base) MCG/ACT inhaler Inhale 2 puffs into the lungs every 6 hours as needed      amiodarone (CORDARONE) 200 MG tablet Take 1 tablet by mouth daily      carbidopa-levodopa (SINEMET CR) 50-200 MG per extended release tablet Take 2 tablets by mouth 3 times daily 180 tablet 11    JARDIANCE 10 MG tablet Take 1 tablet by mouth daily  gabapentin (NEURONTIN) 100 MG capsule Take 1 capsule by mouth in the morning and at bedtime.      omeprazole (PRILOSEC) 40 MG delayed release capsule Take 1 capsule by mouth daily      rosuvastatin (CRESTOR) 20 MG tablet Take 1 tablet by mouth daily            BP (!) 80/49   Pulse 70   Temp 97.5 F (36.4 C) (Oral)   Resp 18   Ht 1.803 m (5' 10.98")   Wt 74.5 kg (164 lb 5.3 oz)   SpO2 98%   BMI 22.93 kg/m     ASSESSMENT     Wound Identification & Type: fissure to gluteal cleft with IAD and possible cutaneous candidiasis to buttocks and perirectal area; purpuric rash to left thigh; superficial skin to right AC  Dressing change: open to air  Verbal consent for picture: Yes    Contributing Factors: diabetes, poor glucose control, decreased mobility, shear force, smoking, anticoagulation therapy, malnutrition, incontinence of stool, and incontinence of urine    Wound 01/28/22 Sacrum Stage 2 (Active)   Wound Image    05/09/2022 1525   Wound Etiology Other 05-09-2022 1525   Dressing Status New dressing applied 2022-05-09 1215   Wound Cleansed Other (Comment) 05/09/2022 1525   Dressing/Treatment Open to air  May 09, 2022 1525   Wound Assessment Dry;Pink/red;Erythema May 09, 2022 1525   Drainage Amount None (dry) 05-09-22 1525   Odor None May 09, 2022 1525   Peri-wound Assessment Blanchable erythema 05-09-2022 1525   Margins Attached edges 2022-05-09 1525   Wound Thickness Description not for Pressure Injury Partial thickness May 09, 2022 1525   Number of days: 88       Wound 04/25/22 Brachial Distal;Right;Anterior (Active)   Wound Image   2022-05-09 1522   Wound Etiology Skin Tear 05/09/2022 1522   Dressing Status New dressing applied May 09, 2022 0426   Wound Cleansed Cleansed with saline 09-May-2022 1522   Dressing/Treatment Open to air 05-09-2022 1522   Wound Assessment Dry;Pink/red;Superficial 09-May-2022 1522   Drainage Amount None (dry) 05/09/2022 1522   Odor None 05-09-22 1522   Peri-wound Assessment Ecchymosis;Fragile 05/09/2022 1522   Margins Attached edges 05/09/22 1522   Wound Thickness Description not for Pressure Injury Partial thickness 2022/05/09 1522   Number of days: 1          PLAN     Skin Care & Pressure Relief Recommendations  Specialty Bed, Minimize Layers of Linen, Turn/reposition approximately every 2 hours, Pillow / Wedges, Manage incontinence, Promote continence; Skin protective lotion/cream to buttocks and sacrum daily and as needed with incontinence care, and Floating heels    Braden: 12  Blood Glucose: 84 on 04/25/22                              WBCs: 4.9 on 2022/05/09    Nutritionist Consulted: Yes   Nutrition Status: Severe malnutrition    Support Surface: gel surface with air pump for increased pressure reduction    Physician/Provider notified:   Recommendations: fissure to gluteal cleft & IAD to buttocks and perirectal area with possible cutaneous candidiasis (erythematous rash with satellite lesions).  Patient also has flat purpuric rash to left thigh possibly related to thrombocytopenia? Per patient, he has had the purpura for some time but unclear how long.  Apply Desitin topical cream and nystatin topical ointment to buttocks,  perirectal, and gluteal cleft BID and prn for soiling.  Mix the two topicals  together approximately 50/50 and apply to affected areas.      Apply Optifoam Border 6x6 foam dressing to both hips/trochanters every three days to redistribute pressure from bony prominence and prevent pressure and friction injury to skin.  Patient prefers to lay directly on sides.  Patient incontinent with loose stool.  Incontinent care provided and documented in flow chart.  Refrain from applying foam dressing to sacrum at this time due to frequent loose stools.  Maintain the the external GU incontinence management system to manage GU incontinence.  Patient able to reposition self in bed as witnessed.  Ensure patient turns q2h to prevent pressure related skin injury to bony prominences.  Float heels with 1-2 pillows lengthwise while in bed for offloading of the heels.  Maintain HOB at 30 degrees or less, if not contraindicated, to reduce pressure to buttocks and sacrum.  Raise foot of bed to help prevent friction and shear injury from sliding down in the bed.  Will continue to follow weekly while in-patient.      Discharge Wound Care Needs: TBD    Teaching completed with:   []  Patient           []  Family member       []  Caregiver          []  Nursing  []  Other    Patient/Caregiver Teaching:  Level of patient/caregiver understanding able to:   []  Indicates understanding       []  Needs reinforcement  []  Unsuccessful      []  Verbal Understanding  []  Demonstrated understanding       []  No evidence of learning  []  Refused teaching         []  N/A       Electronically signed by , RN on 2022-05-04 at 3:34 PM

## 2022-04-26 NOTE — Progress Notes (Signed)
Writer found current med list from [facility] in document sent from same facility and was able to reconcile med list

## 2022-04-26 NOTE — Plan of Care (Signed)
OCCUPATIONAL THERAPY EVALUATION  Patient: Carlos Petersen (71 y.o. male)  Date: 04/25/2022  Primary Diagnosis: Hypokalemia [E87.6]  Colitis [K52.9]  Acute kidney injury ([place]) [N17.9]  Acute kidney injury (AKI) with acute tubular necrosis (ATN) (HCC) [N17.0]       Precautions: Ax2, Fall Risk, Isolation                In place during session:Peripheral IV, External Catheter, and EKG/telemetry     ASSESSMENT  Pt is a 71 y.o. male presenting to [facility] with c/o fatigue, admitted 04/25/22 and currently being treated for AKI with acute tubular necrosis, severe hypokalemia, hypocalcemia, severe malnutrition, anemia, possible UTI, and hematuria. Pt received semi-supine in bed with RN at bedside upon arrival, AXO x4, and agreeable to OT evaluation.     Based on current observations, pt presents with decreased  functional mobility, independence in ADLs, high-level IADLs, ROM, strength, body mechanics, activity tolerance, safety awareness, balance, posture, fine-motor control, increased pain levels (see below for objective details and assist levels).     Overall, pt tolerates session fair with c/o fatigue with activity. Total A req'd for posterior peri care while rolling side to side in bed (RN cleaning pt upon therapist entry with OT assisting once in room). Bed mobility completed with CGA for rolling, min Ax2 for supine>sit, min A for sit>supine, and mod A for scooting. Socks adjusted with total A while seated EOB. Sit<>stand transfers completed with mod Ax2 and additional time. Once returned supine, pt set up for lunch by therapist opening containers and pt able to hold soup bowl, scoop food, and bring spoon>mouth.    Pt will benefit from continued skilled OT services to address current impairments and improve IND and safety with self cares and functional transfers/mobility. Current OT d/c recommendation [place] once medically appropriate.     Other factors to consider for discharge: family/social support, DME,  time since onset, severity of deficits, functional baseline     Patient will benefit from skilled therapy intervention to address the above noted impairments.       PLAN :  Recommendations and Planned Interventions: self care training, therapeutic activities, functional mobility training, balance training, therapeutic exercise, endurance activities, patient education, and home safety training    Recommend with staff: Encourage HEP in prep for ADLs/mobility, Frequent repositioning to prevent skin breakdown, and Use of bed/chair alarm for safety    Recommend next session: Toileting and Seated grooming    Frequency/Duration: Patient will be followed by occupational therapy:  2-3x/week to address goals.    Recommendation for discharge: (in order for the patient to meet his/her long term goals)  [place]    IF patient discharges home will need the following DME: continuing to assess with progress       SUBJECTIVE:   Patient stated "I haven't walked since April."    OBJECTIVE DATA SUMMARY:     Past Medical History:   Diagnosis Date    Cerebral artery occlusion with cerebral infarction ([place])     COPD (chronic obstructive pulmonary disease) ([place])     Depression     Diabetes ([place])     Emphysema lung ([place])     Heart disease     Hypercholesterolemia     Parkinson disease ([place])     Pulmonary embolism ([place])      Past Surgical History:   Procedure Laterality Date    CARDIAC DEFIBRILLATOR PLACEMENT      COLONOSCOPY  COLONOSCOPY N/A 04/07/2022    COLONOSCOPY DIAGNOSTIC performed by Crisoforo Oxford, MD at [place] N/A 04/07/2022    COLONOSCOPY POLYPECTOMY SNARE/COLD BIOPSY performed by Crisoforo Oxford, MD at [facility]    COLONOSCOPY N/A 04/09/2022    COLONOSCOPY POLYPECTOMY REMOVAL SNARE/STOMA performed by Crisoforo Oxford, MD at [facility] 01/2016    UPPER GASTROINTESTINAL ENDOSCOPY N/A 04/07/2022    EGD ESOPHAGOGASTRODUODENOSCOPY  performed by Crisoforo Oxford, MD at [place]          Expanded or extensive additional review of patient history:   Type of Home: Facility  Social/Functional History  Type of Home: Facility    Hand Dominance: right     EXAMINATION OF PERFORMANCE DEFICITS:    Cognitive/Behavioral Status:  Orientation  Orientation Level: Oriented X4  Cognition  Overall Cognitive Status: WNL    Range of Motion:   AROM: Grossly decreased, non-functional (RUE shoulder flexion)    Strength:  Strength: Grossly decreased, non-functional (RUE)    Coordination:  Coordination: Generally decreased, functional    Functional Mobility and Transfers for ADLs:  Bed Mobility:  Bed Mobility Training  Bed Mobility Training: Yes  Rolling: Contact-guard assistance  Supine to Sit: Minimum assistance;Assist X2  Sit to Supine: Minimum assistance;Additional time  Scooting: Minimum assistance;Additional time    Transfers:  Pharmacologist: Yes  Sit to Stand: Moderate assistance;Assist X2;Additional time  Stand to Sit: Moderate assistance;Assist X2;Additional time      Balance:  Balance  Sitting: Intact  Standing: Impaired  Standing - Static: Constant support;Fair  Standing - Dynamic:  (unable to advance feet)      ADL Assessment:   Feeding: Setup  Feeding Skilled Clinical Factors: Containers opened prior to pt eating lunch    LE Dressing: Dependent/Total  LE Dressing Skilled Clinical Factors: Socks adjusted with total A while seated EOB    Toileting: Dependent/Total  Toileting Skilled Clinical Factors: Posterior peri care while rolling side to side in bed    Functional Measure:    KB Home	[place] AM-PACTM "6 Clicks"                                                       Daily Activity Inpatient Short Form  How much help from another person does the patient currently need... Total; A Lot A Little None   1.  Putting on and taking off regular lower body clothing? '[x]'   1 '[]'   2 '[]'   3 '[]'   4   2.  Bathing (including washing, rinsing, drying)?  '[]'   1 '[x]'   2 '[]'   3 '[]'   4   3.  Toileting, which includes using toilet, bedpan or urinal? '[x]'  1 '[]'   2 '[]'   3 '[]'   4   4.  Putting on and taking off regular upper body clothing? '[]'   1 '[]'   2 '[x]'   3 '[]'   4   5.  Taking care of personal grooming such as brushing teeth? '[]'   1 '[]'   2 '[x]'   3 '[]'   4   6.  Eating meals? '[]'   1 '[]'   2 '[x]'   3 '[]'   4    2007, [employer], under license  to [place]. All rights reserved     Score: 13/24     Interpretation of Tool:  Represents clinically-significant functional categories (i.e. Activities of daily living).  Percentage of Impairment CH    0%   CI    1-19% CJ    20-39% CK    40-59% CL    60-79% CM    80-99% CN     100%   AMPAC  Score 6-'24 24 23 ' 20-22 15-19 10-14 7-9 6     Occupational Therapy Evaluation Charge Determination   History Examination Decision-Making   LOW Complexity : Brief history review  MEDIUM Complexity: 3-5 Performance deficits relating to physical, cognitive, or psychosocial skills that result in activity limitations and/or participation restrictions MEDIUM Complexity: Patient may present with comorbidities that affect occupational performance. Minimal to moderate modifications of tasks or assist (eg. physical or verbal) with assist is necessary to enable pt to complete eval      Based on the above components, the patient evaluation is determined to be of the following complexity level: Low    Pain Rating:  Reported L hip pain  Pain Intervention(s):       Activity Tolerance:   Fair  and requires rest breaks    After treatment patient left in no apparent distress:    Patient left in no apparent distress in bed, Call bell within reach, Bed/ chair alarm activated, Side rails x3, and RN at bedside , bed locked and in lowest position    COMMUNICATION/EDUCATION:   The patient's plan of care was discussed with: Physical therapist and Registered nurse    Patient Education  Education Given To: Patient  Education Provided: Role of Therapy;Fall Prevention  Strategies;Plan of Care;ADL Adaptive Strategies;Transfer Training  Education Method: Verbal;Teach Back  Education Outcome: Verbalized understanding;Continued education needed;Demonstrated understanding    The supervising occupational therapist and treating occupational therapist assistant have met to review this patient's progress and plan of care.    This patient's plan of care is appropriate for delegation to OTA.     PT/OT sessions occurred together for increased safety of pt and clinician.     Thank you for this referral,  Ambrose Mantle, OT  Minutes: 24     Problem: Occupational Therapy - Adult  Goal: By Discharge: Performs self-care activities at highest level of function for planned discharge setting.  See evaluation for individualized goals.  Description: FUNCTIONAL STATUS PRIOR TO ADMISSION:  Pt was using a power wheelchair following hip surgery in April. Pt req'd assistance with ADLs.    HOME SUPPORT: The pt was at [facility] for the past three weeks.    Occupational Therapy Goals:  Initiated 04/19/2022  Patient/Family stated goal: Transfer to wheelchair.  1.  Patient will perform lower body dressing with Minimal Assist within 7 day(s).  2.  Patient will perform grooming with Modified Independence within 7 day(s).  3.  Patient will perform bathing with Minimal Assist within 7 day(s).  4.  Patient will perform toilet transfers with Minimal Assist  within 7 day(s).  5.  Patient will perform all aspects of toileting with Minimal Assist within 7 day(s).  6.  Patient will participate in upper extremity therapeutic exercise/activities with Modified [place] within 7 day(s).    Outcome: Progressing

## 2022-04-27 LAB — RENAL FUNCTION PANEL
Albumin: 1.4 g/dL — ABNORMAL LOW (ref 3.5–5.0)
Anion Gap: 7 mmol/L (ref 5–15)
BUN: 24 mg/dL — ABNORMAL HIGH (ref 6–20)
Bun/Cre Ratio: 7 — ABNORMAL LOW (ref 12–20)
CO2: 24 mmol/L (ref 21–32)
Calcium: 5.8 mg/dL — CL (ref 8.5–10.1)
Chloride: 110 mmol/L — ABNORMAL HIGH (ref 97–108)
Creatinine: 3.48 mg/dL — ABNORMAL HIGH (ref 0.70–1.30)
Est, Glom Filt Rate: 18 mL/min/{1.73_m2} — ABNORMAL LOW (ref >60)
Glucose: 88 mg/dL (ref 65–100)
Phosphorus: 3.7 mg/dL (ref 2.6–4.7)
Potassium: 4.4 mmol/L (ref 3.5–5.1)
Sodium: 141 mmol/L (ref 136–145)

## 2022-04-27 LAB — MAGNESIUM: Magnesium: 1.7 mg/dL (ref 1.6–2.4)

## 2022-04-27 LAB — POTASSIUM: Potassium: 4.5 mmol/L (ref 3.5–5.1)

## 2022-04-27 MED ORDER — CALCIUM CARBONATE ANTACID 500 MG PO CHEW
500 | Freq: Three times a day (TID) | ORAL | Status: DC | PRN
Start: 2022-04-27 — End: 2022-04-28

## 2022-04-27 MED ORDER — CALCIUM GLUCONATE-NACL 1-0.675 GM/50ML-% IV SOLN
Freq: Once | INTRAVENOUS | Status: AC
Start: 2022-04-27 — End: 2022-04-27
  Administered 2022-04-27: 13:00:00 1000 mg via INTRAVENOUS

## 2022-04-27 MED ORDER — SODIUM BICARBONATE 8.4 % IV SOLN
8.4 % | INTRAVENOUS | Status: AC
Start: 2022-04-27 — End: 2022-04-28
  Administered 2022-04-27: 20:00:00 via INTRAVENOUS

## 2022-04-27 MED FILL — CALCIUM GLUCONATE-NACL 1-0.675 GM/50ML-% IV SOLN: INTRAVENOUS | Qty: 50

## 2022-04-27 MED FILL — SODIUM CHLORIDE 0.45 % IV SOLN: 0.45 % | INTRAVENOUS | Qty: 1000

## 2022-04-27 MED FILL — VANCOMYCIN HCL 250 MG/5ML PO SOLR: 250 MG/5ML | ORAL | Qty: 5

## 2022-04-27 NOTE — Care Coordination-Inpatient (Signed)
Patient currently pending insurance auth to go to [facility], [place] pending ref # [id number].

## 2022-04-27 NOTE — Progress Notes (Signed)
Hospitalist Progress Note  [facility]    Subjective:   Daily Progress Note: 04/27/2022 9:27 AM    Patient is admitted for C. difficile colitis, severe malnutrition hypocalcemia acute renal failure patient has a poor appetite  low potassium is resolved    Current Facility-Administered Medications   Medication Dose Route Frequency    calcium gluconate 1,000 mg in sodium chloride 50 mL  1,000 mg IntraVENous Once    nystatin (MYCOSTATIN) ointment   Topical BID    zinc oxide 40 % paste   Topical BID    vancomycin (FIRVANQ) 50 MG/ML oral solution 250 mg  250 mg Oral 4 times per day    acidophilus/citrus pectin 1 tablet  1 tablet Oral Daily    sodium chloride flush 0.9 % injection 5-40 mL  5-40 mL IntraVENous 2 times per day    sodium chloride flush 0.9 % injection 5-40 mL  5-40 mL IntraVENous PRN    0.9 % sodium chloride infusion   IntraVENous PRN    enoxaparin Sodium (LOVENOX) injection 30 mg  30 mg SubCUTAneous Daily    ondansetron (ZOFRAN-ODT) disintegrating tablet 4 mg  4 mg Oral Q8H PRN    Or    ondansetron (ZOFRAN) injection 4 mg  4 mg IntraVENous Q6H PRN    acetaminophen (TYLENOL) tablet 650 mg  650 mg Oral Q6H PRN    Or    acetaminophen (TYLENOL) suppository 650 mg  650 mg Rectal Q6H PRN    metronidazole (FLAGYL) 500 mg in 0.9% NaCl 100 mL IVPB premix  500 mg IntraVENous Q8H        Review of Systems  Constitutional: negative for fevers, chills, sweats, and fatigue  Eyes: negative for redness and icterus  Ears, nose, mouth, throat, and face: negative for ear drainage, nasal congestion, sore throat, and voice change  Respiratory: negative for cough, wheezing, or dyspnea on exertion  Cardiovascular: negative for chest pain, dyspnea, palpitations, lower extremity edema  Gastrointestinal: negative for nausea, vomiting, diarrhea, and abdominal pain  Genitourinary:negative for frequency, dysuria, and hematuria  Integument/breast: negative for skin lesion(s) and dryness  Hematologic/lymphatic:  negative for easy bruising, bleeding, and lymphadenopathy  Musculoskeletal:negative for neck pain, back pain, and muscle weakness  Neurological: negative for headaches, dizziness, and weakness  Behavioral/Psych: negative for anxiety and depression  Allergic/Immunologic: negative for urticaria and angioedema        Objective:     BP (!) 118/57 Comment: Notified Biochemist, clinical  Pulse 70   Temp 98.1 F (36.7 C) (Oral)   Resp 16   Ht 1.803 m (5' 10.98")   Wt 74.5 kg (164 lb 5.3 oz)   SpO2 99%   BMI 22.93 kg/m         Temp (24hrs), Avg:97.8 F (36.6 C), Min:97.5 F (36.4 C), Max:98.3 F (36.8 C)      08/29 0701 - 08/29 1900  In: 240 [P.O.:240]  Out: -   08/27 1901 - 08/29 0700  In: 1637.5 [P.O.:680; I.V.:597.5]  Out: 200 [Urine:200]    PHYSICAL EXAM:  Skin: Extremities and face reveal no rashes.   HEENT: Sclerae anicteric. Extra-occular muscles are intact. No oral ulcers. No ENT discharge. The neck is supple.   Cardiovascular: Regular rate and rhythm. No murmurs, gallops, or rubs. PMI nondisplaced.   Respiratory:  Clear breath sounds bilaterally with no wheezes, rales, or rhonchi.   GI: Abdomen nondistended, soft, and nontender. Normal active bowel sounds. No enlargement of the liver or spleen. No masses  palpable.   Rectal: Deferred   Musculoskeletal: No pitting edema of the lower legs. Extremities have good range of motion. No costovertebral tenderness.   Neurological:  Patient is alert and oriented. Cranial nerves II-XII grossly intact  Psychiatric: Mood appears appropriate with judgement intact.   Lymphatic: No cervical or supraclavicular adenopathy.    Additional comments:I reviewed the patient's new clinical lab test results.      Data Review    Recent Results (from the past 24 hour(s))   Clostridium Difficile Toxin/Antigen    Collection Time: 05/16/2022 12:15 PM    Specimen: Stool   Result Value Ref Range    GDH Antigen Positive      C difficile Toxin, EIA Positive (A) Negative      C Diff Toxin Interpretation  Positive for toxigenic C. difficile (A) Negative for toxigenic C. difficile     Magnesium    Collection Time: 04/27/22  7:19 AM   Result Value Ref Range    Magnesium 1.7 1.6 - 2.4 mg/dL   Potassium    Collection Time: 04/27/22  7:19 AM   Result Value Ref Range    Potassium 4.5 3.5 - 5.1 mmol/L   Renal Function Panel    Collection Time: 04/27/22  7:20 AM   Result Value Ref Range    Sodium 141 136 - 145 mmol/L    Potassium 4.4 3.5 - 5.1 mmol/L    Chloride 110 (H) 97 - 108 mmol/L    CO2 24 21 - 32 mmol/L    Anion Gap 7 5 - 15 mmol/L    Glucose 88 65 - 100 mg/dL    BUN 24 (H) 6 - 20 mg/dL    Creatinine 8.31 (H) 0.70 - 1.30 mg/dL    Bun/Cre Ratio 7 (L) 12 - 20      Est, Glom Filt Rate 18 (L) >60 ml/min/1.19m2    Calcium 5.8 (LL) 8.5 - 10.1 mg/dL    Phosphorus 3.7 2.6 - 4.7 mg/dL    Albumin 1.4 (L) 3.5 - 5.0 g/dL         Assessment/Plan:     Principal Problem:    Acute kidney injury (AKI) with acute tubular necrosis (ATN) (HCC)  Resolved Problems:    * No resolved hospital problems. *    Consult dietitian advance patient's diet    Care Plan discussed with: Patient/Family    Total time spent with patient: 45 minutes.

## 2022-04-27 NOTE — Progress Notes (Signed)
[facility]     Renal Daily Progress Note:     Subjective: Looks remarkably improved after getting IV fluids.  Feels better.  Hungry.  Diarrhea still present but may be improved a bit.  Able to stand today.  No pain, no shortness of breath, no nausea or vomiting.      Review of Systems  Pertinent items are noted in HPI.    Objective:     BP 133/62   Pulse 68   Temp 97.7 F (36.5 C) (Oral)   Resp 16   Ht 1.803 m (5' 10.98")   Wt 74.5 kg (164 lb 5.3 oz)   SpO2 99%   BMI 22.93 kg/m   Temp (24hrs), Avg:97.9 F (36.6 C), Min:97.5 F (36.4 C), Max:98.3 F (36.8 C)        Intake/Output Summary (Last 24 hours) at 04/27/2022 1427  Last data filed at 04/27/2022 0930  Gross per 24 hour   Intake 1369.17 ml   Output --   Net 1369.17 ml     Current Facility-Administered Medications   Medication Dose Route Frequency    calcium carbonate (TUMS) chewable tablet 500 mg  500 mg Oral TID PRN    nystatin (MYCOSTATIN) ointment   Topical BID    zinc oxide 40 % paste   Topical BID    vancomycin (FIRVANQ) 50 MG/ML oral solution 250 mg  250 mg Oral 4 times per day    acidophilus/citrus pectin 1 tablet  1 tablet Oral Daily    sodium chloride flush 0.9 % injection 5-40 mL  5-40 mL IntraVENous 2 times per day    sodium chloride flush 0.9 % injection 5-40 mL  5-40 mL IntraVENous PRN    0.9 % sodium chloride infusion   IntraVENous PRN    enoxaparin Sodium (LOVENOX) injection 30 mg  30 mg SubCUTAneous Daily    ondansetron (ZOFRAN-ODT) disintegrating tablet 4 mg  4 mg Oral Q8H PRN    Or    ondansetron (ZOFRAN) injection 4 mg  4 mg IntraVENous Q6H PRN    acetaminophen (TYLENOL) tablet 650 mg  650 mg Oral Q6H PRN    Or    acetaminophen (TYLENOL) suppository 650 mg  650 mg Rectal Q6H PRN    metronidazole (FLAGYL) 500 mg in 0.9% NaCl 100 mL IVPB premix  500 mg IntraVENous Q8H       Physical Exam: appears stated age, cooperative, alert, more interactive and no distress  Head: Normocephalic, without obvious abnormality,  atraumatic  Eyes:  anicteric  Neck: no adenopathy, no JVD, supple, symmetrical, trachea midline, and thyroid not enlarged, symmetric,  Lungs: clear to auscultation bilaterally  Heart:  regular rate and rhythm, and no S3 or S4  Abdomen: soft, non-tender; bowel sounds normal; no masses,  no organomegaly  Extremities:  no edema  Neurologic: Grossly normal -alert and oriented x3       Data Review:     LABS:  Recent Labs     04/27/22  0720 04/27/22  0719 05/11/22  0708 04/25/22  2114 04/12/22  0507   NA 141  --  141 141 138   K 4.4 4.5 2.5* 2.4* 3.0*   CL 110*  --  111* 111* 108   CO2 24  --  20* 19* 24   BUN 24*  --  27* 25* 7   CREATININE 3.48*  --  3.50* 3.46* 0.58*   CALCIUM 5.8*  --  6.6* 6.3* 7.2*   LABALBU 1.4*  --  1.5* 1.5* 1.6*   PHOS 3.7  --  4.7  --  3.0   MG  --  1.7 2.1  --  1.8     Recent Labs     May 14, 2022  0708 04/25/22  2114 04/12/22  0507   WBC 4.9 6.8 2.9*   HGB 7.7* 7.7* 7.7*   HCT 24.7* 24.8* 24.4*   PLT 112* 121* 84*     No results for input(s): NAU, KU, CLU, CREAU in the last 720 hours.    Invalid input(s): PROU    Radiology  CT ABDOMEN PELVIS WO CONTRAST Additional Contrast? None    Result Date: 04/25/2022  1. Diffuse colitis. 2. 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 3. New mild to moderate right pleural effusion with bibasilar atelectasis. New 14 mm left lower lobe nodular lung opacity. Recommend follow-up in 3 months. 4. Refer to above findings for complete details.     XR CHEST PORTABLE    Result Date: 04/25/2022  Small right pleural effusion with underlying atelectasis.      Assessment:   Renal Specific Problems  AKI-improving  Hypokalemia-resolved  Metabolic acidosis-resolved  C. difficile with significant diarrhea > 10 days--improving  Anorexia resolving, patient now hungry      Plan:     Obtain/ Order: labs/cultures/radiology/procedures:  renal panel in a.m.      Therapeutic:    Increase nutritional support  Continue IV fluids although we may be able to stop them tomorrow  Avoid  nonsteroidal anti-inflammatory agents        Dala Dock, MD    [phone]

## 2022-04-27 NOTE — Plan of Care (Signed)
OCCUPATIONAL THERAPY TREATMENT  Patient: Carlos Petersen (71 y.o. male)  Date: 04/27/2022  Primary Diagnosis: Hypokalemia [E87.6]  Colitis [K52.9]  Acute kidney injury ([place]) [N17.9]  Acute kidney injury (AKI) with acute tubular necrosis (ATN) ([place]) [N17.0]       Precautions: Fall Risk, Isolation                Recommendations for nursing mobility: Assist x2    In place during session: Peripheral IV, External Catheter, and EKG/telemetry   Chart, occupational therapy assessment, plan of care, and goals were reviewed.  ASSESSMENT  Patient continues with skilled OT services and is progressing towards goals. Pt semi supine in bed upon OT arrival, agreeable to session. Pt A&O x 4. (See below for objective details and assist levels).     Overall pt tolerated session fair today with completion of bed mobility, transfers, and ADLs.  Pt completed bed mobility and transfers with increased assist this date.  Pt able to complete x2 trials of sit>stand from EOB and take side steps towards [facility] with increased assist and cueing for weight shifting.  Standing bowel hygiene completed with total A.  Pt completed transfer back to supine with assist for LE.  Pt completed semi supine face washing with setup A.  Pt appearing to be in low mood this date compared to prior sessions, RN notified.  Pt will benefit from Ax2 next session to advance OOB mobility.  Pt left semi supine in bed with needs met and call bell within reach.  Current OT recommendations for discharge [place]. Will continue to benefit from skilled OT services, and will continue to progress as tolerated.        PLAN :  Patient continues to benefit from skilled intervention to address functional impairments. Continue treatment per established plan of care to address goals.    Recommend next OT session: Toileting, LB dressing, and Seated grooming    Recommendation for discharge: (in order for the patient to meet his/her long term goals): [place]    IF patient discharges home will need the following DME: TBD       SUBJECTIVE:   Patient stated "I don't think I can do much."    OBJECTIVE DATA SUMMARY:   Cognitive/Behavioral Status:  Orientation  Orientation Level: Oriented X4  Cognition  Overall Cognitive Status: WFL  Following Commands: Follows one step commands consistently  Attention Span: Attends with cues to redirect;Difficulty dividing attention  Safety Judgement: Decreased awareness of need for assistance;Decreased awareness of need for safety  Problem Solving: Assistance required to generate solutions;Assistance required to identify errors made  Insights: Decreased awareness of deficits  Initiation: Requires cues for some  Sequencing: Requires cues for some    Functional Mobility and Transfers for ADLs:  Bed Mobility:  Bed Mobility Training  Rolling: Contact-guard assistance  Supine to Sit: Moderate assistance;Additional time  Sit to Supine: Minimum assistance;Additional time  Scooting: Minimum assistance;Moderate assistance;Additional time    Transfers:  Transfer Training  Sit to Stand: Additional time;Maximum assistance  Stand to Sit: Maximum assistance;Additional time    Balance:  Balance  Sitting: Intact  Standing: Impaired  Standing - Static: Constant support;Fair  Standing - Dynamic: Constant support;Fair;Poor    ADL Intervention:  Grooming: Setup   Grooming Skilled Clinical Factors: semi supine face washing    Toileting: Dependent/Total  Toileting Skilled Clinical Factors: standing bowel hygiene    Pain Rating:  Generalized pain reported, no formal rating given  Activity Tolerance:   Fair  and requires rest breaks    After treatment patient left in no apparent distress:   Bed locked and returned to lowest position, Patient left in no apparent distress in bed, Call bell within reach, Bed/ chair alarm activated, and Side rails x3, and nsg updated     COMMUNICATION/EDUCATION:   The patient's plan of care was discussed with: Registered  nurse    Patient Education  Education Given To: Patient  Education Provided: Role of Therapy;Fall Prevention Strategies;ADL Adaptive Strategies;Transfer Training;Equipment;Energy Conservation;Home Exercise Program  Education Method: Demonstration;Verbal  Education Outcome: Verbalized understanding;Continued education needed;Demonstrated understanding    Thank you for this referral.  Osmara Drummonds, OTA  Minutes: 39    Problem: Occupational Therapy - Adult  Goal: By Discharge: Performs self-care activities at highest level of function for planned discharge setting.  See evaluation for individualized goals.  Description: FUNCTIONAL STATUS PRIOR TO ADMISSION:  Pt was using a power wheelchair following hip surgery in April. Pt req'd assistance with ADLs.    HOME SUPPORT: The pt was at [facility] for the past three weeks.    Occupational Therapy Goals:  Initiated 04/29/2022  Patient/Family stated goal: Transfer to wheelchair.  1.  Patient will perform lower body dressing with Minimal Assist within 7 day(s).  2.  Patient will perform grooming with Modified [place] within 7 day(s).  3.  Patient will perform bathing with Minimal Assist within 7 day(s).  4.  Patient will perform toilet transfers with Minimal Assist  within 7 day(s).  5.  Patient will perform all aspects of toileting with Minimal Assist within 7 day(s).  6.  Patient will participate in upper extremity therapeutic exercise/activities with Modified [place] within 7 day(s).    Outcome: Progressing

## 2022-04-28 LAB — RENAL FUNCTION PANEL
Albumin: 1.6 g/dL — ABNORMAL LOW (ref 3.5–5.0)
Anion Gap: 9 mmol/L (ref 5–15)
BUN: 28 mg/dL — ABNORMAL HIGH (ref 6–20)
Bun/Cre Ratio: 7 — ABNORMAL LOW (ref 12–20)
CO2: 22 mmol/L (ref 21–32)
Calcium: 6.6 mg/dL — ABNORMAL LOW (ref 8.5–10.1)
Chloride: 111 mmol/L — ABNORMAL HIGH (ref 97–108)
Creatinine: 4.1 mg/dL — ABNORMAL HIGH (ref 0.70–1.30)
Est, Glom Filt Rate: 15 mL/min/{1.73_m2} — ABNORMAL LOW (ref >60)
Glucose: 96 mg/dL (ref 65–100)
Phosphorus: 3.8 mg/dL (ref 2.6–4.7)
Potassium: 2.8 mmol/L — ABNORMAL LOW (ref 3.5–5.1)
Sodium: 142 mmol/L (ref 136–145)

## 2022-04-28 LAB — CBC WITH AUTO DIFFERENTIAL
Absolute Immature Granulocyte: 0 10*3/uL (ref 0.00–0.04)
Basophils %: 0 % (ref 0–1)
Basophils Absolute: 0 10*3/uL (ref 0.0–0.1)
Eosinophils %: 1 % (ref 0–7)
Eosinophils Absolute: 0 10*3/uL (ref 0.0–0.4)
Hematocrit: 23.5 % — ABNORMAL LOW (ref 36.6–50.3)
Hemoglobin: 7.2 g/dL — ABNORMAL LOW (ref 12.1–17.0)
Immature Granulocytes: 1 % — ABNORMAL HIGH (ref 0–0.5)
Lymphocytes %: 23 % (ref 12–49)
Lymphocytes Absolute: 0.9 10*3/uL (ref 0.8–3.5)
MCH: 26.2 PG (ref 26.0–34.0)
MCHC: 30.6 g/dL (ref 30.0–36.5)
MCV: 85.5 FL (ref 80.0–99.0)
MPV: 8.9 FL (ref 8.9–12.9)
Monocytes %: 9 % (ref 5–13)
Monocytes Absolute: 0.3 10*3/uL (ref 0.0–1.0)
Neutrophils %: 66 % (ref 32–75)
Neutrophils Absolute: 2.4 10*3/uL (ref 1.8–8.0)
Nucleated RBCs: 0 PER 100 WBC
Platelets: 106 10*3/uL — ABNORMAL LOW (ref 150–400)
RBC: 2.75 M/uL — ABNORMAL LOW (ref 4.10–5.70)
RDW: 24.4 % — ABNORMAL HIGH (ref 11.5–14.5)
WBC: 3.6 10*3/uL — ABNORMAL LOW (ref 4.1–11.1)
nRBC: 0 10*3/uL (ref 0.00–0.01)

## 2022-04-28 MED ORDER — POTASSIUM CHLORIDE CRYS ER 20 MEQ PO TBCR
20 MEQ | Freq: Two times a day (BID) | ORAL | Status: DC
Start: 2022-04-28 — End: 2022-04-28
  Administered 2022-04-28 – 2022-04-29 (×2): 20 meq via ORAL

## 2022-04-28 MED ORDER — CALCIUM CARBONATE ANTACID 500 MG PO CHEW
500 | Freq: Three times a day (TID) | ORAL | Status: DC
Start: 2022-04-28 — End: 2022-05-10
  Administered 2022-04-28 – 2022-05-10 (×36): 500 mg via ORAL

## 2022-04-28 MED FILL — SODIUM CHLORIDE 0.45 % IV SOLN: 0.45 % | INTRAVENOUS | Qty: 1000

## 2022-04-28 MED FILL — VANCOMYCIN HCL 250 MG/5ML PO SOLR: 250 MG/5ML | ORAL | Qty: 5

## 2022-04-28 NOTE — Progress Notes (Signed)
Hospitalist Progress Note  [facility]    Subjective:   Daily Progress Note: 04/28/2022 5:02 PM    Complaints of nausea patient was seen by dietitian diagnosed with severe protein calorie malnutrition positive for colitis    Current Facility-Administered Medications   Medication Dose Route Frequency    calcium carbonate (TUMS) chewable tablet 500 mg  500 mg Oral TID    potassium chloride (KLOR-CON M) extended release tablet 20 mEq  20 mEq Oral BID    nystatin (MYCOSTATIN) ointment   Topical BID    zinc oxide 40 % paste   Topical BID    vancomycin (FIRVANQ) 50 MG/ML oral solution 250 mg  250 mg Oral 4 times per day    acidophilus/citrus pectin 1 tablet  1 tablet Oral Daily    sodium chloride flush 0.9 % injection 5-40 mL  5-40 mL IntraVENous 2 times per day    sodium chloride flush 0.9 % injection 5-40 mL  5-40 mL IntraVENous PRN    0.9 % sodium chloride infusion   IntraVENous PRN    enoxaparin Sodium (LOVENOX) injection 30 mg  30 mg SubCUTAneous Daily    ondansetron (ZOFRAN-ODT) disintegrating tablet 4 mg  4 mg Oral Q8H PRN    Or    ondansetron (ZOFRAN) injection 4 mg  4 mg IntraVENous Q6H PRN    acetaminophen (TYLENOL) tablet 650 mg  650 mg Oral Q6H PRN    Or    acetaminophen (TYLENOL) suppository 650 mg  650 mg Rectal Q6H PRN    metronidazole (FLAGYL) 500 mg in 0.9% NaCl 100 mL IVPB premix  500 mg IntraVENous Q8H        Review of Systems  Constitutional: negative for fevers, chills, sweats, and fatigue  Eyes: negative for redness and icterus  Ears, nose, mouth, throat, and face: negative for ear drainage, nasal congestion, sore throat, and voice change  Respiratory: negative for cough, wheezing, or dyspnea on exertion  Cardiovascular: negative for chest pain, dyspnea, palpitations, lower extremity edema  Gastrointestinal: negative for nausea, vomiting, diarrhea, and abdominal pain  Genitourinary:negative for frequency, dysuria, and hematuria  Integument/breast: negative for skin lesion(s) and  dryness  Hematologic/lymphatic: negative for easy bruising, bleeding, and lymphadenopathy  Musculoskeletal:negative for neck pain, back pain, and muscle weakness  Neurological: negative for headaches, dizziness, and weakness  Behavioral/Psych: negative for anxiety and depression  Allergic/Immunologic: negative for urticaria and angioedema        Objective:     BP (!) 118/48 Comment: Fran LPN notified  Pulse 71   Temp 97.3 F (36.3 C) (Oral)   Resp 18   Ht 1.803 m (5' 10.98")   Wt 74.5 kg (164 lb 5.3 oz)   SpO2 100%   BMI 22.93 kg/m         Temp (24hrs), Avg:97.8 F (36.6 C), Min:97.3 F (36.3 C), Max:98.1 F (36.7 C)      08/30 0701 - 08/30 1900  In: 480 [P.O.:480]  Out: -   08/28 1901 - 08/30 0700  In: 781.7 [P.O.:540; I.V.:10]  Out: -     PHYSICAL EXAM:  Skin: Extremities and face reveal no rashes.   HEENT: Sclerae anicteric. Extra-occular muscles are intact. No oral ulcers. No ENT discharge. The neck is supple.   Cardiovascular: Regular rate and rhythm. No murmurs, gallops, or rubs. PMI nondisplaced.   Respiratory:  Clear breath sounds bilaterally with no wheezes, rales, or rhonchi.   GI: Abdomen nondistended, soft, and nontender. Normal active bowel sounds.  No enlargement of the liver or spleen. No masses palpable.   Rectal: Deferred   Musculoskeletal: No pitting edema of the lower legs. Extremities have good range of motion. No costovertebral tenderness.   Neurological:  Patient is alert and oriented. Cranial nerves II-XII grossly intact  Psychiatric: Mood appears appropriate with judgement intact.   Lymphatic: No cervical or supraclavicular adenopathy.    Additional comments:I reviewed the patient's new clinical lab test results.      Data Review    Recent Results (from the past 24 hour(s))   Renal Function Panel    Collection Time: 04/28/22  6:43 AM   Result Value Ref Range    Sodium 142 136 - 145 mmol/L    Potassium 2.8 (L) 3.5 - 5.1 mmol/L    Chloride 111 (H) 97 - 108 mmol/L    CO2 22 21 - 32  mmol/L    Anion Gap 9 5 - 15 mmol/L    Glucose 96 65 - 100 mg/dL    BUN 28 (H) 6 - 20 mg/dL    Creatinine 1.66 (H) 0.70 - 1.30 mg/dL    Bun/Cre Ratio 7 (L) 12 - 20      Est, Glom Filt Rate 15 (L) >60 ml/min/1.81m2    Calcium 6.6 (L) 8.5 - 10.1 mg/dL    Phosphorus 3.8 2.6 - 4.7 mg/dL    Albumin 1.6 (L) 3.5 - 5.0 g/dL   CBC with Auto Differential    Collection Time: 04/28/22  6:43 AM   Result Value Ref Range    WBC 3.6 (L) 4.1 - 11.1 K/uL    RBC 2.75 (L) 4.10 - 5.70 M/uL    Hemoglobin 7.2 (L) 12.1 - 17.0 g/dL    Hematocrit 06.3 (L) 36.6 - 50.3 %    MCV 85.5 80.0 - 99.0 FL    MCH 26.2 26.0 - 34.0 PG    MCHC 30.6 30.0 - 36.5 g/dL    RDW 01.6 (H) 01.0 - 14.5 %    Platelets 106 (L) 150 - 400 K/uL    MPV 8.9 8.9 - 12.9 FL    Nucleated RBCs 0.0 0.0 PER 100 WBC    nRBC 0.00 0.00 - 0.01 K/uL    Neutrophils % 66 32 - 75 %    Lymphocytes % 23 12 - 49 %    Monocytes % 9 5 - 13 %    Eosinophils % 1 0 - 7 %    Basophils % 0 0 - 1 %    Immature Granulocytes 1 (H) 0 - 0.5 %    Neutrophils Absolute 2.4 1.8 - 8.0 K/UL    Lymphocytes Absolute 0.9 0.8 - 3.5 K/UL    Monocytes Absolute 0.3 0.0 - 1.0 K/UL    Eosinophils Absolute 0.0 0.0 - 0.4 K/UL    Basophils Absolute 0.0 0.0 - 0.1 K/UL    Absolute Immature Granulocyte 0.0 0.00 - 0.04 K/UL    Differential Type AUTOMATED           Assessment/Plan:     Principal Problem:    Acute kidney injury (AKI) with acute tubular necrosis (ATN) ([facility])  Resolved Problems:    * No resolved hospital problems. *        Care Plan discussed with: Patient/Family    Total time spent with patient: 45   minutes.

## 2022-04-28 NOTE — Care Coordination-Inpatient (Signed)
CM reviewed clinical chart. Patient has received insurance authorization to go to [facility] if medically ready for discharge.

## 2022-04-28 NOTE — Plan of Care (Signed)
Problem: Discharge Planning  Goal: Discharge to home or other facility with appropriate resources  04/28/2022 1130 by Wynelle Beckmann, LPN  Outcome: Progressing  Flowsheets (Taken 04/28/2022 1124)  Discharge to home or other facility with appropriate resources: Identify barriers to discharge with patient and caregiver  04/27/2022 2251 by Wandra Feinstein, RN  Outcome: Progressing     Problem: Skin/Tissue Integrity  Goal: Absence of new skin breakdown  04/28/2022 1130 by Wynelle Beckmann, LPN  Outcome: Progressing  04/27/2022 2251 by Wandra Feinstein, RN  Outcome: Progressing     Problem: Safety - Adult  Goal: Free from fall injury  04/28/2022 1130 by Wynelle Beckmann, LPN  Outcome: Progressing  04/27/2022 2251 by Wandra Feinstein, RN  Outcome: Progressing     Problem: Skin/Tissue Integrity - Adult  Goal: Skin integrity remains intact  04/28/2022 1130 by Wynelle Beckmann, LPN  Outcome: Progressing  Flowsheets (Taken 04/28/2022 1124)  Skin Integrity Remains Intact: Monitor for areas of redness and/or skin breakdown  04/27/2022 2251 by Wandra Feinstein, RN  Outcome: Progressing  Goal: Incisions, wounds, or drain sites healing without S/S of infection  04/28/2022 1130 by Wynelle Beckmann, LPN  Outcome: Progressing  Flowsheets (Taken 04/28/2022 1124)  Incisions, Wounds, or Drain Sites Healing Without Sign and Symptoms of Infection: ADMISSION and DAILY: Assess and document risk factors for pressure ulcer development  04/27/2022 2251 by Wandra Feinstein, RN  Outcome: Progressing  Goal: Oral mucous membranes remain intact  04/28/2022 1130 by Wynelle Beckmann, LPN  Outcome: Progressing  Flowsheets (Taken 04/28/2022 1124)  Oral Mucous Membranes Remain Intact: Assess oral mucosa and hygiene practices  04/27/2022 2251 by Wandra Feinstein, RN  Outcome: Progressing     Problem: Musculoskeletal - Adult  Goal: Return mobility to safest level of function  04/28/2022 1130 by Wynelle Beckmann, LPN  Outcome:  Progressing  Flowsheets (Taken 04/28/2022 1124)  Return Mobility to Safest Level of Function: Assist with transfers and ambulation using safe patient handling equipment as needed  04/27/2022 2251 by Wandra Feinstein, RN  Outcome: Progressing  Goal: Maintain proper alignment of affected body part  04/28/2022 1130 by Wynelle Beckmann, LPN  Outcome: Progressing  Flowsheets (Taken 04/28/2022 1124)  Maintain proper alignment of affected body part: Instruct and reinforce with patient and family use of appropriate assistive device and precautions (e.g. spinal or hip dislocation precautions)  04/27/2022 2251 by Wandra Feinstein, RN  Outcome: Progressing  Goal: Return ADL status to a safe level of function  04/28/2022 1130 by Wynelle Beckmann, LPN  Outcome: Progressing  Flowsheets (Taken 04/28/2022 1124)  Return ADL Status to a Safe Level of Function: Administer medication as ordered  04/27/2022 2251 by Wandra Feinstein, RN  Outcome: Progressing     Problem: Metabolic/Fluid and Electrolytes - Adult  Goal: Electrolytes maintained within normal limits  04/28/2022 1130 by Wynelle Beckmann, LPN  Outcome: Progressing  Flowsheets (Taken 04/28/2022 1124)  Electrolytes maintained within normal limits: Monitor labs and assess patient for signs and symptoms of electrolyte imbalances  04/27/2022 2251 by Wandra Feinstein, RN  Outcome: Progressing  Goal: Hemodynamic stability and optimal renal function maintained  04/28/2022 1130 by Wynelle Beckmann, LPN  Outcome: Progressing  Flowsheets (Taken 04/28/2022 1124)  Hemodynamic stability and optimal renal function maintained: Monitor labs and assess for signs and symptoms of volume excess or deficit  04/27/2022 2251 by Wandra Feinstein, RN  Outcome: Progressing  Goal: Glucose maintained within prescribed range  04/28/2022 1130 by Wynelle Beckmann, LPN  Outcome: Progressing  Flowsheets (Taken 04/28/2022 1124)  Glucose maintained within prescribed range: Monitor blood glucose as  ordered  04/27/2022 2251 by Wandra Feinstein, RN  Outcome: Progressing     Problem: Gastrointestinal - Adult  Goal: Minimal or absence of nausea and vomiting  Outcome: Progressing     Problem: Genitourinary - Adult  Goal: Absence of urinary retention  Outcome: Progressing     Problem: Hematologic - Adult  Goal: Maintains hematologic stability  Outcome: Progressing     Problem: Infection - Adult  Goal: Absence of infection at discharge  Outcome: Progressing     Problem: Chronic Conditions and Co-morbidities  Goal: Patient's chronic conditions and co-morbidity symptoms are monitored and maintained or improved  04/28/2022 1130 by Wynelle Beckmann, LPN  Outcome: Progressing  Flowsheets (Taken 04/28/2022 1124)  Care Plan - Patient's Chronic Conditions and Co-Morbidity Symptoms are Monitored and Maintained or Improved: Monitor and assess patient's chronic conditions and comorbid symptoms for stability, deterioration, or improvement  04/27/2022 2251 by Wandra Feinstein, RN  Outcome: Progressing     Problem: Nutrition Deficit:  Goal: Optimize nutritional status  04/28/2022 1130 by Wynelle Beckmann, LPN  Outcome: Progressing  04/27/2022 2251 by Wandra Feinstein, RN  Outcome: Progressing

## 2022-04-28 NOTE — Consults (Signed)
Comprehensive Nutrition Assessment    Type and Reason for Visit:  Reassess, Consult (Interim/consult for unintended wt loss)    Nutrition Recommendations/Plan:   Continue ETC diet   Ensure Enlive 2x/day  Juven 2x/day  Consider appetite stimulant  Replete K, continue to monitor and replete prn   Snacks between meals (chicken/tuna salad w/ crackers, chocolate pudding)  Appreciate PO encouragement at meals, continue as able   Continue to document PO/ONS intake in I/Os  Consider EN if congruent w/ GOC if pt continues meeting <60% of EERs     Malnutrition Assessment:  Malnutrition Status:  Severe malnutrition (04/02/2022 1257)    Context:  Acute Illness     Findings of the 6 clinical characteristics of malnutrition:  Energy Intake:  50% or less of estimated energy requirements for 5 or more days (decreased intake x1-2 wks, minimal intakes PTA per pt report)  Weight Loss:  Greater than 7.5% over 3 months (~22% wt loss x 3 mo. per EMR; nutritionally significant wt loss per pt report x2-3 mo.)     Body Fat Loss:  Mild body fat loss (per observational NFPE r/t contact iso.) Orbital   Muscle Mass Loss:  Moderate muscle mass loss Temples (temporalis), Clavicles (pectoralis & deltoids) (per observational NFPE r/t contact iso.)  Fluid Accumulation:  No significant fluid accumulation    Grip Strength:  Not Performed    Nutrition Assessment:    Presents to ED from [facility] for evaluation of generalized weakness, decreased PO intake, and abdominal discomfort ongoing x 1 week. No substantial PO intake x 1 week, multiple episodes of loose stools, and lack of appetite per pt family per H&P. ~22% wt loss x 3 months per EMR, nutritionally significant if documented wt's are accurate (no method recorded/stated wt). Pt noted decreasing wt trends x 2-3 months (246# - 158#, 33% wt loss - nutritionally significant if accurate report). Pt reported decreased appetite/intakes x 1-2 wks. Limited PO intake PTA (few eggs/day per pt  report). Pt on full liquid diet (coffee for B). Pt amenable to starting ONS to better meet EER and to promote wound healing. Plan to monitor intakes and adjust ONS rec'd prn. Rec'd to advance diet when medically appropriate. (8/30) Chart reviewed for interim/consult for unintended wt loss. Diet advanced to ETC. Pt with poor intakes (0% x 2 meals 8/29, 0% B per pt report). Suspect pt's decreased intake may be r/t food preferences - pt noted disliking meals. Food preferences obtained and recorded. Pt enjoying Ensure - increase to 2x/day. Continue Juven 2x/d to promote wound healing. Pt amenable to snacks between meals to encourage PO intake. Continue to monitor intakes, consider nutrition support if congruent w/ GOC if pt continues to meet <60% of EER. Labs: K 2.9, Cl 111, BUN 28, Creat 4.10, GFR 15, Ca 6.6, Alb 1.6, H&H 7.2/23.5. Meds: Citrus pectin, Tums, Lovenox, Flagyl, Kcl, Vancomycin, Zofran.    Nutrition Related Findings:    LBM 8/29, no edema noted, no hx of c/s issues - pt noted difficulty c/s issues only when w/o dentures. +N/V yesterday, improved today, +D, improving per pt/sitter report. NFPE deferred d/t contact iso.; observed moderate wasting per observational NFPE. Wound Type: Multiple, Pressure Injury, Stage II, Surgical Incision (Stage 2 PI (sacral), Incision (L thigh))       Current Nutrition Intake & Therapies:    Average Meal Intake: 0%, 1-25%  Average Supplements Intake: 26-50% (+Ensure, no Juven consumption per pt report)  ADULT ORAL NUTRITION SUPPLEMENT; Lunch; Standard  High Calorie/High Protein Oral Supplement  ADULT ORAL NUTRITION SUPPLEMENT; Breakfast, Dinner; Wound Healing Oral Supplement  ADULT DIET; Easy to Chew    Anthropometric Measures:  Height: 180.3 cm (5' 10.98")  Ideal Body Weight (IBW): 172 lbs (78 kg)       Current Body Weight: 164 lb 3.9 oz (74.5 kg), 95.5 % IBW. Weight Source: Bed Scale  Current BMI (kg/m2): 22.9        Weight Adjustment For: No Adjustment                 BMI  Categories: Normal Weight (BMI 22.0 to 24.9) age over 44    Estimated Daily Nutrient Needs:  Energy Requirements Based On: Kcal/kg  Weight Used for Energy Requirements: Current  Energy (kcal/day): 1863 - 2235 kcals/day (25 - 30 kcals/kg)  Weight Used for Protein Requirements: Current  Protein (g/day): 89 - 104 g/day (1.2 - 1.4 - wound)  Method Used for Fluid Requirements: 1 ml/kcal  Fluid (ml/day): 2951 - 2235 ml/day (42m/kcal)    Nutrition Diagnosis:   Severe malnutrition related to inadequate protein-energy intake as evidenced by Criteria as identified in malnutrition assessment    Nutrition Interventions:   Food and/or Nutrient Delivery: Continue Current Diet, Continue Oral Nutrition Supplement, Modify Oral Nutrition Supplement  Nutrition Education/Counseling: No recommendation at this time  Coordination of Nutrition Care: Continue to monitor while inpatient, Feeding Assistance/Environment Change  Plan of Care discussed with: pt, sitter, RN    Goals:  Previous Goal Met: No Progress toward Goal(s)  Goals: PO intake 50% or greater, by next RD assessment       Nutrition Monitoring and Evaluation:   Behavioral-Environmental Outcomes: None Identified  Food/Nutrient Intake Outcomes: Food and Nutrient Intake, Supplement Intake, Diet Advancement/Tolerance  Physical Signs/Symptoms Outcomes: Weight, Skin, Nutrition Focused Physical Findings, Meal Time Behavior, Diarrhea, Biochemical Data    Discharge Planning:    Continue Oral Nutrition Supplement, Continue current diet     EAudley Hose RD  Contact: [phone]

## 2022-04-28 NOTE — Progress Notes (Signed)
[facility]     Renal Daily Progress Note:     Subjective: Looks remarkably improved after getting IV fluids.  Feels better.  Hungry.  Diarrhea still present but may be improved a bit.  Able to stand today.  No pain, no shortness of breath, no nausea or vomiting. Serum creatine up a bit. Major complaint is inability to sleep      Review of Systems  Pertinent items are noted in HPI.    Objective:     BP (!) 118/48 Comment: Drenda Freeze LPN notified  Pulse 71   Temp 97.3 F (36.3 C)   Resp 18   Ht 1.803 m (5' 10.98")   Wt 74.5 kg (164 lb 5.3 oz)   SpO2 100%   BMI 22.93 kg/m   Temp (24hrs), Avg:97.7 F (36.5 C), Min:97.3 F (36.3 C), Max:98.1 F (36.7 C)        Intake/Output Summary (Last 24 hours) at 04/28/2022 1319  Last data filed at 04/28/2022 0800  Gross per 24 hour   Intake 245 ml   Output --   Net 245 ml     Current Facility-Administered Medications   Medication Dose Route Frequency    calcium carbonate (TUMS) chewable tablet 500 mg  500 mg Oral TID    potassium chloride (KLOR-CON M) extended release tablet 20 mEq  20 mEq Oral BID    nystatin (MYCOSTATIN) ointment   Topical BID    zinc oxide 40 % paste   Topical BID    vancomycin (FIRVANQ) 50 MG/ML oral solution 250 mg  250 mg Oral 4 times per day    acidophilus/citrus pectin 1 tablet  1 tablet Oral Daily    sodium chloride flush 0.9 % injection 5-40 mL  5-40 mL IntraVENous 2 times per day    sodium chloride flush 0.9 % injection 5-40 mL  5-40 mL IntraVENous PRN    0.9 % sodium chloride infusion   IntraVENous PRN    enoxaparin Sodium (LOVENOX) injection 30 mg  30 mg SubCUTAneous Daily    ondansetron (ZOFRAN-ODT) disintegrating tablet 4 mg  4 mg Oral Q8H PRN    Or    ondansetron (ZOFRAN) injection 4 mg  4 mg IntraVENous Q6H PRN    acetaminophen (TYLENOL) tablet 650 mg  650 mg Oral Q6H PRN    Or    acetaminophen (TYLENOL) suppository 650 mg  650 mg Rectal Q6H PRN    metronidazole (FLAGYL) 500 mg in 0.9% NaCl 100 mL IVPB premix  500 mg IntraVENous Q8H        Physical Exam: appears stated age, cooperative, alert, more interactive and no distress  Head: Normocephalic, without obvious abnormality, atraumatic  Eyes:  anicteric  Neck: no adenopathy, no JVD, supple, symmetrical, trachea midline, and thyroid not enlarged, symmetric,  Lungs: clear to auscultation bilaterally  Heart:  regular rate and rhythm, and no S3 or S4  Abdomen: soft, non-tender; bowel sounds normal; no masses,  no organomegaly  Extremities:  no edema  Neurologic: Grossly normal -alert and oriented x3       Data Review:     LABS:  Recent Labs     04/28/22  0643 04/27/22  0720 04/27/22  0719 05-24-22  0708 04/25/22  2114 04/12/22  0507   NA 142 141  --  141   < > 138   K 2.8* 4.4 4.5 2.5*   < > 3.0*   CL 111* 110*  --  111*   < > 108  CO2 22 24  --  20*   < > 24   BUN 28* 24*  --  27*   < > 7   CREATININE 4.10* 3.48*  --  3.50*   < > 0.58*   CALCIUM 6.6* 5.8*  --  6.6*   < > 7.2*   LABALBU 1.6* 1.4*  --  1.5*   < > 1.6*   PHOS 3.8 3.7  --  4.7  --  3.0   MG  --   --  1.7 2.1  --  1.8    < > = values in this interval not displayed.     Recent Labs     04/28/22  0643 15-May-2022  0708 04/25/22  2114   WBC 3.6* 4.9 6.8   HGB 7.2* 7.7* 7.7*   HCT 23.5* 24.7* 24.8*   PLT 106* 112* 121*     No results for input(s): NAU, KU, CLU, CREAU in the last 720 hours.    Invalid input(s): PROU    Radiology  CT ABDOMEN PELVIS WO CONTRAST Additional Contrast? None    Result Date: 04/25/2022  1. Diffuse colitis. 2. 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 3. New mild to moderate right pleural effusion with bibasilar atelectasis. New 14 mm left lower lobe nodular lung opacity. Recommend follow-up in 3 months. 4. Refer to above findings for complete details.     XR CHEST PORTABLE    Result Date: 04/25/2022  Small right pleural effusion with underlying atelectasis.      Assessment:   Renal Specific Problems  AKI- creatinine up today  Hypokalemia  Metabolic acidosis-resolving  C. difficile with significant diarrhea > 10  days--improving  Anorexia resolving, patient now hungry      Plan:     Obtain/ Order: labs/cultures/radiology/procedures:  renal panel in a.m.      Therapeutic:    Increase nutritional support  Oral KCL  Oral NaHCO3  Continue IV fluids   Avoid nonsteroidal anti-inflammatory agents  Trazodone for sleep      Dala Dock, MD    [phone]

## 2022-04-29 ENCOUNTER — Inpatient Hospital Stay: Admit: 2022-04-29 | Payer: MEDICARE | Primary: Internal Medicine

## 2022-04-29 DIAGNOSIS — N179 Acute kidney failure, unspecified: Secondary | ICD-10-CM

## 2022-04-29 LAB — CBC WITH AUTO DIFFERENTIAL
Absolute Immature Granulocyte: 0 10*3/uL (ref 0.00–0.04)
Basophils %: 0 % (ref 0–1)
Basophils Absolute: 0 10*3/uL (ref 0.0–0.1)
Eosinophils %: 1 % (ref 0–7)
Eosinophils Absolute: 0.1 10*3/uL (ref 0.0–0.4)
Hematocrit: 22.1 % — ABNORMAL LOW (ref 36.6–50.3)
Hemoglobin: 7 g/dL — ABNORMAL LOW (ref 12.1–17.0)
Immature Granulocytes: 1 % — ABNORMAL HIGH (ref 0–0.5)
Lymphocytes %: 29 % (ref 12–49)
Lymphocytes Absolute: 1.2 10*3/uL (ref 0.8–3.5)
MCH: 26.9 PG (ref 26.0–34.0)
MCHC: 31.7 g/dL (ref 30.0–36.5)
MCV: 85 FL (ref 80.0–99.0)
MPV: 9.1 FL (ref 8.9–12.9)
Monocytes %: 10 % (ref 5–13)
Monocytes Absolute: 0.4 10*3/uL (ref 0.0–1.0)
Neutrophils %: 59 % (ref 32–75)
Neutrophils Absolute: 2.4 10*3/uL (ref 1.8–8.0)
Nucleated RBCs: 0 PER 100 WBC
Platelets: 89 10*3/uL — ABNORMAL LOW (ref 150–400)
RBC: 2.6 M/uL — ABNORMAL LOW (ref 4.10–5.70)
RDW: 24.7 % — ABNORMAL HIGH (ref 11.5–14.5)
WBC: 4 10*3/uL — ABNORMAL LOW (ref 4.1–11.1)
nRBC: 0 10*3/uL (ref 0.00–0.01)

## 2022-04-29 LAB — RENAL FUNCTION PANEL
Albumin: 1.6 g/dL — ABNORMAL LOW (ref 3.5–5.0)
Anion Gap: 8 mmol/L (ref 5–15)
BUN: 30 mg/dL — ABNORMAL HIGH (ref 6–20)
Bun/Cre Ratio: 7 — ABNORMAL LOW (ref 12–20)
CO2: 22 mmol/L (ref 21–32)
Calcium: 6.8 mg/dL — ABNORMAL LOW (ref 8.5–10.1)
Chloride: 112 mmol/L — ABNORMAL HIGH (ref 97–108)
Creatinine: 4.49 mg/dL — ABNORMAL HIGH (ref 0.70–1.30)
Est, Glom Filt Rate: 13 mL/min/{1.73_m2} — ABNORMAL LOW (ref >60)
Glucose: 99 mg/dL (ref 65–100)
Phosphorus: 3.8 mg/dL (ref 2.6–4.7)
Potassium: 3.2 mmol/L — ABNORMAL LOW (ref 3.5–5.1)
Sodium: 142 mmol/L (ref 136–145)

## 2022-04-29 LAB — MAGNESIUM: Magnesium: 2 mg/dL (ref 1.6–2.4)

## 2022-04-29 LAB — PSA, TOTAL AND FREE
PSA, Free Pct: 15 %
PSA, Free: 0.03 ng/mL
PSA: 0.2 ng/mL (ref 0.0–4.0)

## 2022-04-29 MED ORDER — TRAZODONE HCL 50 MG PO TABS
50 | Freq: Every evening | ORAL | Status: DC
Start: 2022-04-29 — End: 2022-05-10
  Administered 2022-04-29 – 2022-05-10 (×12): 50 mg via ORAL

## 2022-04-29 MED ORDER — ALBUMIN HUMAN 5 % IV SOLN
5 % | Freq: Three times a day (TID) | INTRAVENOUS | Status: AC
Start: 2022-04-29 — End: 2022-04-30
  Administered 2022-04-29 – 2022-04-30 (×3): 12.5 g via INTRAVENOUS

## 2022-04-29 MED ORDER — POTASSIUM CHLORIDE CRYS ER 20 MEQ PO TBCR
20 MEQ | Freq: Two times a day (BID) | ORAL | Status: AC
Start: 2022-04-29 — End: 2022-04-30
  Administered 2022-04-29 – 2022-04-30 (×4): 20 meq via ORAL

## 2022-04-29 MED ORDER — CHOLESTYRAMINE LIGHT 4 G PO PACK
4 | Freq: Three times a day (TID) | ORAL | Status: DC
Start: 2022-04-29 — End: 2022-05-10
  Administered 2022-04-29 – 2022-05-09 (×21): 4 g via ORAL

## 2022-04-29 MED ORDER — SODIUM CHLORIDE 0.45 % IV SOLN
0.45 % | INTRAVENOUS | Status: AC
Start: 2022-04-29 — End: 2022-04-29
  Administered 2022-04-29 (×2): via INTRAVENOUS

## 2022-04-29 MED ORDER — FIDAXOMICIN 200 MG PO TABS
200 MG | Freq: Two times a day (BID) | ORAL | Status: AC
Start: 2022-04-29 — End: 2022-05-08
  Administered 2022-04-29 – 2022-05-09 (×20): 200 mg via ORAL

## 2022-04-29 MED ORDER — SODIUM BICARBONATE 650 MG PO TABS
650 MG | Freq: Two times a day (BID) | ORAL | Status: AC
Start: 2022-04-29 — End: 2022-05-04
  Administered 2022-04-29 – 2022-05-04 (×12): 650 mg via ORAL

## 2022-04-29 MED ORDER — PROCHLORPERAZINE EDISYLATE 10 MG/2ML IJ SOLN
10 MG/2ML | Freq: Once | INTRAMUSCULAR | Status: AC
Start: 2022-04-29 — End: 2022-04-29
  Administered 2022-04-29: 21:00:00 2.5 mg via INTRAMUSCULAR

## 2022-04-29 MED FILL — DIFICID 200 MG PO TABS: 200 MG | ORAL | Qty: 1

## 2022-04-29 MED FILL — SODIUM CHLORIDE 0.45 % IV SOLN: 0.45 % | INTRAVENOUS | Qty: 1000

## 2022-04-29 MED FILL — VANCOMYCIN HCL 250 MG/5ML PO SOLR: 250 MG/5ML | ORAL | Qty: 5

## 2022-04-29 MED FILL — ALBUTEIN 5 % IV SOLN: 5 % | INTRAVENOUS | Qty: 250

## 2022-04-29 NOTE — Progress Notes (Signed)
@  1340   Primary nurse administerd cholestyramine light packet 4 g  Patient vomited some of the medication beverage up. Medication volume was . Patient was given zofran 4 mg previously @ 0937. Patient stated he felt nauseous and then vomited the med.Called attending and he recommended 25 mg of phenergan 1x dose and a stat abdominal xray. Nurse verbally read back dr. Recommended orders and placed orders for patient.

## 2022-04-29 NOTE — Plan of Care (Signed)
Problem: Discharge Planning  Goal: Discharge to home or other facility with appropriate resources  Outcome: Progressing  Flowsheets (Taken 04/29/2022 1033)  Discharge to home or other facility with appropriate resources: Identify barriers to discharge with patient and caregiver     Problem: Skin/Tissue Integrity  Goal: Absence of new skin breakdown  Outcome: Progressing     Problem: Safety - Adult  Goal: Free from fall injury  Outcome: Progressing     Problem: Skin/Tissue Integrity - Adult  Goal: Skin integrity remains intact  Outcome: Progressing  Flowsheets (Taken 04/29/2022 1033)  Skin Integrity Remains Intact: Monitor for areas of redness and/or skin breakdown  Goal: Incisions, wounds, or drain sites healing without S/S of infection  Outcome: Progressing  Flowsheets (Taken 04/29/2022 1033)  Incisions, Wounds, or Drain Sites Healing Without Sign and Symptoms of Infection: ADMISSION and DAILY: Assess and document risk factors for pressure ulcer development  Goal: Oral mucous membranes remain intact  Outcome: Progressing  Flowsheets (Taken 04/29/2022 1033)  Oral Mucous Membranes Remain Intact: Assess oral mucosa and hygiene practices     Problem: Musculoskeletal - Adult  Goal: Return mobility to safest level of function  Outcome: Progressing  Flowsheets (Taken 04/29/2022 1033)  Return Mobility to Safest Level of Function: Assess patient stability and activity tolerance for standing, transferring and ambulating with or without assistive devices  Goal: Maintain proper alignment of affected body part  Outcome: Progressing  Flowsheets (Taken 04/29/2022 1033)  Maintain proper alignment of affected body part: Instruct and reinforce with patient and family use of appropriate assistive device and precautions (e.g. spinal or hip dislocation precautions)  Goal: Return ADL status to a safe level of function  Outcome: Progressing  Flowsheets (Taken 04/29/2022 1033)  Return ADL Status to a Safe Level of Function: Administer  medication as ordered     Problem: Metabolic/Fluid and Electrolytes - Adult  Goal: Electrolytes maintained within normal limits  Outcome: Progressing  Flowsheets (Taken 04/29/2022 1033)  Electrolytes maintained within normal limits: Monitor labs and assess patient for signs and symptoms of electrolyte imbalances  Goal: Hemodynamic stability and optimal renal function maintained  Outcome: Progressing  Flowsheets (Taken 04/29/2022 1033)  Hemodynamic stability and optimal renal function maintained: Monitor labs and assess for signs and symptoms of volume excess or deficit  Goal: Glucose maintained within prescribed range  Outcome: Progressing  Flowsheets (Taken 04/29/2022 1033)  Glucose maintained within prescribed range: Monitor blood glucose as ordered     Problem: Gastrointestinal - Adult  Goal: Minimal or absence of nausea and vomiting  Outcome: Progressing     Problem: Genitourinary - Adult  Goal: Absence of urinary retention  Outcome: Progressing     Problem: Hematologic - Adult  Goal: Maintains hematologic stability  Outcome: Progressing  Flowsheets (Taken 04/29/2022 1033)  Maintains hematologic stability: Assess for signs and symptoms of bleeding or hemorrhage     Problem: Chronic Conditions and Co-morbidities  Goal: Patient's chronic conditions and co-morbidity symptoms are monitored and maintained or improved  Outcome: Progressing  Flowsheets (Taken 04/29/2022 1033)  Care Plan - Patient's Chronic Conditions and Co-Morbidity Symptoms are Monitored and Maintained or Improved: Monitor and assess patient's chronic conditions and comorbid symptoms for stability, deterioration, or improvement     Problem: Nutrition Deficit:  Goal: Optimize nutritional status  Outcome: Progressing

## 2022-04-29 NOTE — Consults (Signed)
Infectious Disease Consult Note    Reason for Consult: C.diff infection   Date of Consultation: April 29, 2022  Date of Admission: 04/25/2022  Referring Physician: Dr Court Joy       HPI:71-y.o WM admitted with AKI,electrolyte abnormalities, suspected UTI on 08/28. ID consult requested due to C. difficile infection with ongoing diarrhea despite oral Vanc and IV metronidazole.  His medical history is significant for CAD, COPD, DM, Parkinson's disease, and PE. Per records he tested positive for C. difficile and 12/2021, treated w a 10 day course of oral Vancomycin 125 mg QID.  He reports experiencing loose stools since his diagnosis with C. difficile.  Stool for C. difficile on 8/28 was again positive for C. difficile toxin.   He has been afebrile since presentation hemodynamically stable and maintaining O2 sats on RA.  Initial labs showed a WBC of 4.9, trended down to 3.6, Cr 3.46, K 2.4, and Calcium 6.3. UA On 08/28 revealed 10-20 urine WBCs.  CT abdo/pel 8/28 revealed diffuse colitis, cirrhosis with mild to moderate arthritis new mild to moderate right pleural effusion.  He is on oral vancomycin and IV metronidazole. Staff reported ongoing bouts of loose stools, reportedly had 8 BMs overnight. Pt denied acute events during my assessment.     Review of Systems:     Gen: Generalized weakness, poor oral intake    CV:  Negative for chest pain, dyspnea on exertion, leg edema   Lungs: Negative for shortness of breath, cough, wheezing   Abdomen: loose stools, no abdominal pain    Genitourinary: Negative for genital pain or genital discharge     Neuro: Negative for headache, numbness, tingling, extremity weakness   Skin: Negative for rash, sores/open wounds   Musculoskeletal: Negative for joint pain, joint swelling, joint erythema    Psych: Negative for manic behavior       Past Medical History:  Past Medical History:   Diagnosis Date    Cerebral artery occlusion with cerebral infarction (HCC)     COPD (chronic obstructive  pulmonary disease) (HCC)     Depression     Diabetes (HCC)     Emphysema lung (HCC)     Heart disease     Hypercholesterolemia     Parkinson disease (HCC)     Pulmonary embolism (HCC)          Past surgical history     Past Surgical History:   Procedure Laterality Date    CARDIAC DEFIBRILLATOR PLACEMENT      COLONOSCOPY      COLONOSCOPY N/A 04/07/2022    COLONOSCOPY DIAGNOSTIC performed by Skeet Simmer, MD at [facility]    COLONOSCOPY N/A 04/07/2022    COLONOSCOPY POLYPECTOMY SNARE/COLD BIOPSY performed by Skeet Simmer, MD at [facility]    COLONOSCOPY N/A 04/09/2022    COLONOSCOPY POLYPECTOMY REMOVAL SNARE/STOMA performed by Skeet Simmer, MD at [facility] ENDOSCOPY    PACEMAKER      PACEMAKER PLACEMENT      TOTAL HIP ARTHROPLASTY Right 01/2016    UPPER GASTROINTESTINAL ENDOSCOPY N/A 04/07/2022    EGD ESOPHAGOGASTRODUODENOSCOPY performed by Skeet Simmer, MD at [facility] ENDOSCOPY        Social History   Social History     Tobacco Use    Smoking status: Every Day     Packs/day: 0.50     Types: Cigarettes    Smokeless tobacco: Never   Substance Use Topics    Alcohol use: Never    Drug use: Never  Family history   No family history on file.       Allergies:  No Known Allergies      Medications:  No current facility-administered medications on file prior to encounter.     Current Outpatient Medications on File Prior to Encounter   Medication Sig Dispense Refill    midodrine (PROAMATINE) 10 MG tablet Take 1 tablet by mouth 3 times daily (with meals) 90 tablet 0    phosphorus (K PHOS NEUTRAL) 155-852-130 MG tablet Take 2 tablets by mouth 4 times daily for 5 days 30 tablet 0    aspirin 81 MG EC tablet Take 1 tablet by mouth daily 30 tablet 3    ferrous sulfate (IRON 325) 325 (65 Fe) MG tablet Take 1 tablet by mouth daily (with breakfast) 30 tablet 0    cholestyramine (QUESTRAN) 4 g packet Take 1 packet by mouth 3 times daily (with meals) for 7 days 21 packet 0    VORTIoxetine HBr (TRINTELLIX) 20 MG TABS tablet Take 1 tablet by mouth daily       fluticasone-umeclidin-vilant (TRELEGY ELLIPTA) 100-62.5-25 MCG/ACT AEPB inhaler Inhale 1 puff into the lungs daily      alendronate (FOSAMAX) 70 MG tablet Take 1 tablet by mouth every 7 days On Wednesday      albuterol sulfate HFA (PROVENTIL;VENTOLIN;PROAIR) 108 (90 Base) MCG/ACT inhaler Inhale 2 puffs into the lungs every 6 hours as needed      amiodarone (CORDARONE) 200 MG tablet Take 1 tablet by mouth daily      carbidopa-levodopa (SINEMET CR) 50-200 MG per extended release tablet Take 2 tablets by mouth 3 times daily 180 tablet 11    JARDIANCE 10 MG tablet Take 1 tablet by mouth daily      gabapentin (NEURONTIN) 100 MG capsule Take 1 capsule by mouth in the morning and at bedtime.      omeprazole (PRILOSEC) 40 MG delayed release capsule Take 1 capsule by mouth daily      rosuvastatin (CRESTOR) 20 MG tablet Take 1 tablet by mouth daily       Current Facility-Administered Medications   Medication Dose Route Frequency Provider Last Rate Last Admin    cholestyramine light packet 4 g  4 g Oral TID Blake Divine, MD   4 g at 04/29/22 1030    Fidaxomicin (DIFICID) tablet 200 mg  200 mg Oral BID Sullivan Jacuinde V, MD   200 mg at 04/29/22 1058    calcium carbonate (TUMS) chewable tablet 500 mg  500 mg Oral TID Rudene Re, MD   500 mg at 04/29/22 0936    traZODone (DESYREL) tablet 50 mg  50 mg Oral Nightly Blake Divine, MD   50 mg at 04/28/22 2321    potassium chloride 20 mEq, sodium bicarbonate 50 mEq in sodium chloride 0.45 % 1,000 mL infusion   IntraVENous Continuous Rudene Re, MD 100 mL/hr at 04/29/22 0040 New Bag at 04/29/22 0040    potassium chloride (KLOR-CON M) extended release tablet 20 mEq  20 mEq Oral BID Rudene Re, MD   20 mEq at 04/29/22 0932    sodium bicarbonate tablet 650 mg  650 mg Oral BID Rudene Re, MD   650 mg at 04/29/22 1023    nystatin (MYCOSTATIN) ointment   Topical BID Blake Divine, MD   Given at 04/29/22 1013    zinc oxide 40 % paste   Topical BID Blake Divine,  MD   Given  at 04/29/22 1014    acidophilus/citrus pectin 1 tablet  1 tablet Oral Daily Blake Divine, MD   1 tablet at 04/29/22 0936    sodium chloride flush 0.9 % injection 5-40 mL  5-40 mL IntraVENous 2 times per day Blake Divine, MD   10 mL at 04/29/22 1023    sodium chloride flush 0.9 % injection 5-40 mL  5-40 mL IntraVENous PRN Blake Divine, MD        0.9 % sodium chloride infusion   IntraVENous PRN Blake Divine, MD        ondansetron (ZOFRAN-ODT) disintegrating tablet 4 mg  4 mg Oral Q8H PRN Blake Divine, MD   4 mg at 04/29/22 9357    Or    ondansetron (ZOFRAN) injection 4 mg  4 mg IntraVENous Q6H PRN Blake Divine, MD   4 mg at 04/28/22 1407    acetaminophen (TYLENOL) tablet 650 mg  650 mg Oral Q6H PRN Blake Divine, MD        Or    acetaminophen (TYLENOL) suppository 650 mg  650 mg Rectal Q6H PRN Blake Divine, MD        metronidazole (FLAGYL) 500 mg in 0.9% NaCl 100 mL IVPB premix  500 mg IntraVENous Q8H Blake Divine, MD 100 mL/hr at 04/29/22 0937 500 mg at 04/29/22 0177            Physical Exam:    Vitals: Patient Vitals for the past 24 hrs:   Temp Pulse Resp BP SpO2   04/29/22 0851 98 F (36.7 C) 84 18 132/66 100 %   04/29/22 0356 97.5 F (36.4 C) 82 18 (!) 132/57 99 %   04/29/22 0150 97.5 F (36.4 C) 88 18 115/63 100 %   04/28/22 1705 97.7 F (36.5 C) 70 18 (!) 132/50 100 %   04/28/22 1205 97.3 F (36.3 C) 71 18 (!) 118/48 100 %     GEN: NAD, AAO x 4, pale, appears chronically ill   HEENT: NCAT, PERRLA  HEART: S1, S2+, RRR, No murmur   Lungs: CTA B/l, decreased at the bases   Abdomen: soft, ND, NT, +BS   Genitourinary: no genital discharge, no foley  Extremities: trace edema  Skin: no rash or chronic wounds       Labs:   Recent Labs     04/28/22  0643 04/29/22  0645   WBC 3.6* 4.0*   HGB 7.2* 7.0*   HCT 23.5* 22.1*   PLT 106* 89*     Recent Labs     04/27/22  0720 04/28/22  0643 04/29/22  0645   BUN 24* 28* 30*   CREATININE 3.48* 4.10* 4.49*       No results found for: CRP,  CRPM   No components found for: SR       Microbiology Data:     Results       Procedure Component Value Units Date/Time    Clostridium Difficile Toxin/Antigen [[id number]]  (Abnormal) Collected: 2022-05-22 1215    Order Status: Completed Specimen: Stool Updated: 05/22/22 1511     [facility] Antigen Positive        C difficile Toxin, EIA Positive        Comment: Results verified, phoned to and read back by Joellyn Rued RN AT 1510 ON [id number] [facility]        C Diff Toxin Interpretation       Positive for toxigenic C. difficile  Imaging:  CT ABDOMEN PELVIS WO CONTRAST Additional Contrast? None    Result Date: 04/25/2022  1. Diffuse colitis. 2. 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 3. New mild to moderate right pleural effusion with bibasilar atelectasis. New 14 mm left lower lobe nodular lung opacity. Recommend follow-up in 3 months. 4. Refer to above findings for complete details.     XR CHEST PORTABLE    Result Date: 04/25/2022  Small right pleural effusion with underlying atelectasis.     [place] RETROPERITONEAL COMPLETE    Result Date: 04/27/2022  1.  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 2.  Layering debris in the bladder, correlate with urinalysis. 3.  Splenomegaly. 4.  Small volume ascites and right pleural effusion.        Assessment / Plan:     Severe C.diff infection, recurrent, initial diagnosis was in 12/2021 treated w oral Vanc         Diffuse colitis on CT abdo/pel, no evidence of an acute abdomen on exam         Ongoing bouts of loose stools. Afebrile, hemodynamically stable, leukopenic        Started on fidaxomicin, d/c oral Vanco, continue IV metronidazole probiotics        Routine labs in the morning     2. AKI/hypocalcemia and hypokalemia: Due to  # 1, being followed by nephrology     3. Suspected UTI w abnormal UA on admission, Cx not done       Denies urinary symptoms today    4. Severe failure to thrive/generalized weakness, chronic     5. H/o COPD, Parkinson disease and CVA      Jene Every, MD     04/29/2022

## 2022-04-29 NOTE — Progress Notes (Signed)
Hospitalist Progress Note  [facility]    Subjective:   Daily Progress Note: 04/29/2022 6:17 PM    Ongoing loose stools x10 as per nursing staff  evaluation reviewed by infectious disease  Nausea x1    Current Facility-Administered Medications   Medication Dose Route Frequency    cholestyramine light packet 4 g  4 g Oral TID    Fidaxomicin (DIFICID) tablet 200 mg  200 mg Oral BID    albumin human 5% IV solution 12.5 g  12.5 g IntraVENous Q8H    calcium carbonate (TUMS) chewable tablet 500 mg  500 mg Oral TID    traZODone (DESYREL) tablet 50 mg  50 mg Oral Nightly    potassium chloride 20 mEq, sodium bicarbonate 50 mEq in sodium chloride 0.45 % 1,000 mL infusion   IntraVENous Continuous    potassium chloride (KLOR-CON M) extended release tablet 20 mEq  20 mEq Oral BID    sodium bicarbonate tablet 650 mg  650 mg Oral BID    nystatin (MYCOSTATIN) ointment   Topical BID    zinc oxide 40 % paste   Topical BID    acidophilus/citrus pectin 1 tablet  1 tablet Oral Daily    sodium chloride flush 0.9 % injection 5-40 mL  5-40 mL IntraVENous 2 times per day    sodium chloride flush 0.9 % injection 5-40 mL  5-40 mL IntraVENous PRN    0.9 % sodium chloride infusion   IntraVENous PRN    ondansetron (ZOFRAN-ODT) disintegrating tablet 4 mg  4 mg Oral Q8H PRN    Or    ondansetron (ZOFRAN) injection 4 mg  4 mg IntraVENous Q6H PRN    acetaminophen (TYLENOL) tablet 650 mg  650 mg Oral Q6H PRN    Or    acetaminophen (TYLENOL) suppository 650 mg  650 mg Rectal Q6H PRN    metronidazole (FLAGYL) 500 mg in 0.9% NaCl 100 mL IVPB premix  500 mg IntraVENous Q8H        Review of Systems  Constitutional: negative for fevers, chills, sweats, and fatigue  Eyes: negative for redness and icterus  Ears, nose, mouth, throat, and face: negative for ear drainage, nasal congestion, sore throat, and voice change  Respiratory: negative for cough, wheezing, or dyspnea on exertion  Cardiovascular: negative for chest pain, dyspnea,  palpitations, lower extremity edema  Gastrointestinal: Positive for nausea, vomiting, diarrhea, and abdominal pain  Genitourinary:negative for frequency, dysuria, and hematuria  Integument/breast: negative for skin lesion(s) and dryness  Hematologic/lymphatic: negative for easy bruising, bleeding, and lymphadenopathy  Musculoskeletal:negative for neck pain, back pain, and muscle weakness  Neurological: negative for headaches, dizziness, and weakness  Behavioral/Psych: negative for anxiety and depression  Allergic/Immunologic: negative for urticaria and angioedema        Objective:     BP 125/72   Pulse 75   Temp 97.7 F (36.5 C) (Axillary)   Resp 15   Ht 1.803 m (5' 10.98")   Wt 74.5 kg (164 lb 5.3 oz)   SpO2 100%   BMI 22.93 kg/m         Temp (24hrs), Avg:97.6 F (36.4 C), Min:97.5 F (36.4 C), Max:98 F (36.7 C)      08/31 0701 - 08/31 1900  In: 597 [P.O.:597]  Out: 200 [Urine:200]  08/29 1901 - 08/31 0700  In: 2205 [P.O.:700; I.V.:1205]  Out: 300 [Urine:300]    PHYSICAL EXAM:  Skin: Extremities and face reveal no rashes.   HEENT: Sclerae anicteric. Extra-occular  muscles are intact. No oral ulcers. No ENT discharge. The neck is supple.   Cardiovascular: Regular rate and rhythm. No murmurs, gallops, or rubs. PMI nondisplaced.   Respiratory:  Clear breath sounds bilaterally with no wheezes, rales, or rhonchi.   GI: Abdomen nondistended, soft, and nontender. Normal active bowel sounds. No enlargement of the liver or spleen. No masses palpable.   Rectal: Deferred   Musculoskeletal: No pitting edema of the lower legs. Extremities have good range of motion. No costovertebral tenderness.   Neurological:  Patient is alert and oriented. Cranial nerves II-XII grossly intact  Psychiatric: Mood appears appropriate with judgement intact.   Lymphatic: No cervical or supraclavicular adenopathy.    Additional comments:I reviewed the patient's new clinical lab test results.      Data Review    Recent Results (from the  past 24 hour(s))   Renal Function Panel    Collection Time: 04/29/22  6:45 AM   Result Value Ref Range    Sodium 142 136 - 145 mmol/L    Potassium 3.2 (L) 3.5 - 5.1 mmol/L    Chloride 112 (H) 97 - 108 mmol/L    CO2 22 21 - 32 mmol/L    Anion Gap 8 5 - 15 mmol/L    Glucose 99 65 - 100 mg/dL    BUN 30 (H) 6 - 20 mg/dL    Creatinine 5.09 (H) 0.70 - 1.30 mg/dL    Bun/Cre Ratio 7 (L) 12 - 20      Est, Glom Filt Rate 13 (L) >60 ml/min/1.37m2    Calcium 6.8 (L) 8.5 - 10.1 mg/dL    Phosphorus 3.8 2.6 - 4.7 mg/dL    Albumin 1.6 (L) 3.5 - 5.0 g/dL   CBC with Auto Differential    Collection Time: 04/29/22  6:45 AM   Result Value Ref Range    WBC 4.0 (L) 4.1 - 11.1 K/uL    RBC 2.60 (L) 4.10 - 5.70 M/uL    Hemoglobin 7.0 (L) 12.1 - 17.0 g/dL    Hematocrit 32.6 (L) 36.6 - 50.3 %    MCV 85.0 80.0 - 99.0 FL    MCH 26.9 26.0 - 34.0 PG    MCHC 31.7 30.0 - 36.5 g/dL    RDW 71.2 (H) 45.8 - 14.5 %    Platelets 89 (L) 150 - 400 K/uL    MPV 9.1 8.9 - 12.9 FL    Nucleated RBCs 0.0 0.0 PER 100 WBC    nRBC 0.00 0.00 - 0.01 K/uL    Neutrophils % 59 32 - 75 %    Lymphocytes % 29 12 - 49 %    Monocytes % 10 5 - 13 %    Eosinophils % 1 0 - 7 %    Basophils % 0 0 - 1 %    Immature Granulocytes 1 (H) 0 - 0.5 %    Neutrophils Absolute 2.4 1.8 - 8.0 K/UL    Lymphocytes Absolute 1.2 0.8 - 3.5 K/UL    Monocytes Absolute 0.4 0.0 - 1.0 K/UL    Eosinophils Absolute 0.1 0.0 - 0.4 K/UL    Basophils Absolute 0.0 0.0 - 0.1 K/UL    Absolute Immature Granulocyte 0.0 0.00 - 0.04 K/UL    Differential Type AUTOMATED     Magnesium    Collection Time: 04/29/22  6:45 AM   Result Value Ref Range    Magnesium 2.0 1.6 - 2.4 mg/dL         Assessment/Plan:  Principal Problem:    Acute kidney injury (AKI) with acute tubular necrosis (ATN) (HCC)  Active Problems:    Acute kidney injury (HCC)    Clostridioides difficile infection    Colitis    Hypokalemia  Resolved Problems:    * No resolved hospital problems. *  Hypokalemia corrected patient is now on Dificid  X-ray of  the abdomen      Care Plan discussed with: Patient/Family    Total time spent with patient: 45 minutes.

## 2022-04-29 NOTE — Progress Notes (Signed)
Provider informed of platelet count of 89 and excessive stools. Recommendations are to discontinue lovenox and initiate SCD's, insert rectal tube, give cholestyramine 4mg  three times a day. Orders verified by readback and orders entered into Epic. Primary nurse informed.

## 2022-04-29 NOTE — Progress Notes (Signed)
[facility]     Renal Daily Progress Note:     Subjective: Looks remarkably improved after getting IV fluids.  Feels better.  Hungry.  Diarrhea still present but may be improved a bit; has rectal tube/ fecal management system.  No pain, no shortness of breath, no nausea or vomiting. Serum creatine up a bit. Major complaint is inability to sleep      Review of Systems  Pertinent items are noted in HPI.    Objective:     BP 132/66   Pulse 84   Temp 98 F (36.7 C) (Oral)   Resp 18   Ht 1.803 m (5' 10.98")   Wt 74.5 kg (164 lb 5.3 oz)   SpO2 100%   BMI 22.93 kg/m   Temp (24hrs), Avg:97.7 F (36.5 C), Min:97.5 F (36.4 C), Max:98 F (36.7 C)        Intake/Output Summary (Last 24 hours) at 04/29/2022 1229  Last data filed at 04/29/2022 1030  Gross per 24 hour   Intake 2080 ml   Output 500 ml   Net 1580 ml     Current Facility-Administered Medications   Medication Dose Route Frequency    cholestyramine light packet 4 g  4 g Oral TID    Fidaxomicin (DIFICID) tablet 200 mg  200 mg Oral BID    calcium carbonate (TUMS) chewable tablet 500 mg  500 mg Oral TID    traZODone (DESYREL) tablet 50 mg  50 mg Oral Nightly    potassium chloride 20 mEq, sodium bicarbonate 50 mEq in sodium chloride 0.45 % 1,000 mL infusion   IntraVENous Continuous    potassium chloride (KLOR-CON M) extended release tablet 20 mEq  20 mEq Oral BID    sodium bicarbonate tablet 650 mg  650 mg Oral BID    nystatin (MYCOSTATIN) ointment   Topical BID    zinc oxide 40 % paste   Topical BID    acidophilus/citrus pectin 1 tablet  1 tablet Oral Daily    sodium chloride flush 0.9 % injection 5-40 mL  5-40 mL IntraVENous 2 times per day    sodium chloride flush 0.9 % injection 5-40 mL  5-40 mL IntraVENous PRN    0.9 % sodium chloride infusion   IntraVENous PRN    ondansetron (ZOFRAN-ODT) disintegrating tablet 4 mg  4 mg Oral Q8H PRN    Or    ondansetron (ZOFRAN) injection 4 mg  4 mg IntraVENous Q6H PRN    acetaminophen (TYLENOL) tablet 650 mg  650  mg Oral Q6H PRN    Or    acetaminophen (TYLENOL) suppository 650 mg  650 mg Rectal Q6H PRN    metronidazole (FLAGYL) 500 mg in 0.9% NaCl 100 mL IVPB premix  500 mg IntraVENous Q8H       Physical Exam: appears stated age, cooperative, alert, more interactive and no distress  Head: Normocephalic, without obvious abnormality, atraumatic  Eyes:  anicteric  Neck: no adenopathy, no JVD, supple, symmetrical, trachea midline, and thyroid not enlarged, symmetric,  Lungs: clear to auscultation bilaterally  Heart:  regular rate and rhythm, and no S3 or S4  Abdomen: soft, non-tender; bowel sounds normal; no masses,  no organomegaly  Extremities:  no edema  Neurologic: Grossly normal -alert and oriented x3       Data Review:     LABS:  Recent Labs     04/29/22  0645 04/28/22  9485 04/27/22  0720 04/27/22  0719 May 06, 2022  0708   NA  142 142 141  --  141   K 3.2* 2.8* 4.4 4.5 2.5*   CL 112* 111* 110*  --  111*   CO2 22 22 24   --  20*   BUN 30* 28* 24*  --  27*   CREATININE 4.49* 4.10* 3.48*  --  3.50*   CALCIUM 6.8* 6.6* 5.8*  --  6.6*   LABALBU 1.6* 1.6* 1.4*  --  1.5*   PHOS 3.8 3.8 3.7  --  4.7   MG 2.0  --   --  1.7 2.1     Recent Labs     04/29/22  0645 04/28/22  0643 May 26, 2022  0708   WBC 4.0* 3.6* 4.9   HGB 7.0* 7.2* 7.7*   HCT 22.1* 23.5* 24.7*   PLT 89* 106* 112*     No results for input(s): NAU, KU, CLU, CREAU in the last 720 hours.    Invalid input(s): PROU    Radiology  CT ABDOMEN PELVIS WO CONTRAST Additional Contrast? None    Result Date: 04/25/2022  1. Diffuse colitis. 2. 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 3. New mild to moderate right pleural effusion with bibasilar atelectasis. New 14 mm left lower lobe nodular lung opacity. Recommend follow-up in 3 months. 4. Refer to above findings for complete details.     XR CHEST PORTABLE    Result Date: 04/25/2022  Small right pleural effusion with underlying atelectasis.      Assessment:   Renal Specific Problems  AKI- creatinine up  today  Hypokalemia  Hypoalbumin  Metabolic acidosis-resolving  C. difficile with significant diarrhea > 10 days--improving  Anorexia resolving, patient now hungry      Plan:     Obtain/ Order: labs/cultures/radiology/procedures:  renal panel in a.m.      Therapeutic:    Increase nutritional support  Oral KCL  Oral NaHCO3  Continue IV fluids   Avoid nonsteroidal anti-inflammatory agents  Trazodone for sleep  IV albumin    04/27/2022, MD    [phone]

## 2022-04-29 NOTE — Progress Notes (Signed)
OT tx session attempted at 1130, however pt noted with low Hgb and low platelets.  Discussed with RN and will hold tx session this date.  Will continue to follow pt and attempt tx session at a later time as time allows.  Thank you.

## 2022-04-30 LAB — CBC WITH AUTO DIFFERENTIAL
Absolute Immature Granulocyte: 0 10*3/uL (ref 0.00–0.04)
Basophils %: 0 % (ref 0–1)
Basophils Absolute: 0 10*3/uL (ref 0.0–0.1)
Eosinophils %: 2 % (ref 0–7)
Eosinophils Absolute: 0.1 10*3/uL (ref 0.0–0.4)
Hematocrit: 22.1 % — ABNORMAL LOW (ref 36.6–50.3)
Hemoglobin: 6.9 g/dL — ABNORMAL LOW (ref 12.1–17.0)
Immature Granulocytes: 1 % — ABNORMAL HIGH (ref 0–0.5)
Lymphocytes %: 22 % (ref 12–49)
Lymphocytes Absolute: 1 10*3/uL (ref 0.8–3.5)
MCH: 27.1 PG (ref 26.0–34.0)
MCHC: 31.2 g/dL (ref 30.0–36.5)
MCV: 86.7 FL (ref 80.0–99.0)
MPV: 9.1 FL (ref 8.9–12.9)
Monocytes %: 8 % (ref 5–13)
Monocytes Absolute: 0.4 10*3/uL (ref 0.0–1.0)
Neutrophils %: 67 % (ref 32–75)
Neutrophils Absolute: 3 10*3/uL (ref 1.8–8.0)
Nucleated RBCs: 0 PER 100 WBC
Platelets: 97 10*3/uL — ABNORMAL LOW (ref 150–400)
RBC: 2.55 M/uL — ABNORMAL LOW (ref 4.10–5.70)
RDW: 25.1 % — ABNORMAL HIGH (ref 11.5–14.5)
WBC: 4.5 10*3/uL (ref 4.1–11.1)
nRBC: 0 10*3/uL (ref 0.00–0.01)

## 2022-04-30 LAB — RENAL FUNCTION PANEL
Albumin: 1.9 g/dL — ABNORMAL LOW (ref 3.5–5.0)
Anion Gap: 8 mmol/L (ref 5–15)
BUN: 34 mg/dL — ABNORMAL HIGH (ref 6–20)
Bun/Cre Ratio: 7 — ABNORMAL LOW (ref 12–20)
CO2: 22 mmol/L (ref 21–32)
Calcium: 7 mg/dL — ABNORMAL LOW (ref 8.5–10.1)
Chloride: 113 mmol/L — ABNORMAL HIGH (ref 97–108)
Creatinine: 4.75 mg/dL — ABNORMAL HIGH (ref 0.70–1.30)
Est, Glom Filt Rate: 12 mL/min/{1.73_m2} — ABNORMAL LOW (ref >60)
Glucose: 96 mg/dL (ref 65–100)
Phosphorus: 3.2 mg/dL (ref 2.6–4.7)
Potassium: 3.5 mmol/L (ref 3.5–5.1)
Sodium: 143 mmol/L (ref 136–145)

## 2022-04-30 MED ORDER — SODIUM CHLORIDE 0.45 % IV SOLN
0.45 % | INTRAVENOUS | Status: AC
Start: 2022-04-30 — End: 2022-04-30
  Administered 2022-04-30: 05:00:00 via INTRAVENOUS

## 2022-04-30 MED FILL — DIFICID 200 MG PO TABS: 200 MG | ORAL | Qty: 1

## 2022-04-30 MED FILL — SODIUM CHLORIDE 0.45 % IV SOLN: 0.45 % | INTRAVENOUS | Qty: 1000

## 2022-04-30 NOTE — Progress Notes (Signed)
Infectious Disease Progress Note               Subjective:   Pt seen and examined at bedside     Objective:   Physical Exam:     BP 120/63   Pulse 85   Temp 97.9 F (36.6 C) (Oral)   Resp 19   Ht 5' 10.98" (1.803 m)   Wt 164 lb 5.3 oz (74.5 kg)   SpO2 100%   BMI 22.93 kg/m    O2 Device: None (Room air)    Temp (24hrs), Avg:97.9 F (36.6 C), Min:97.5 F (36.4 C), Max:98.2 F (36.8 C)    No intake/output data recorded.   08/30 1901 - 09/01 0700  In: 3685.3 [P.O.:1147; I.V.:1950]  Out: 950 [Urine:650]    General: NAD, alert  HEENT: PERLA, Moist mucosa   Lungs: CTA b/l   Heart: S1S2+, RRR, no murmur  Abdo: Soft, NT, ND, +BS   GU:   Exts: No edema, + pulses b/l   Skin: No wounds, No rashes or lesions    Data Review:       Recent Days:    Recent Labs     04/28/22  0643 04/29/22  0645 04/30/22  0606   WBC 3.6* 4.0* 4.5   HGB 7.2* 7.0* 6.9*   HCT 23.5* 22.1* 22.1*   PLT 106* 89* 97*     Recent Labs     04/28/22  0643 04/29/22  0645 04/30/22  0604   BUN 28* 30* 34*   CREATININE 4.10* 4.49* 4.75*       No results found for: CRP       Microbiology     Results       Procedure Component Value Units Date/Time    Clostridium Difficile Toxin/Antigen [[id number]]  (Abnormal) Collected: 05-23-22 1215    Order Status: Completed Specimen: Stool Updated: 23-May-2022 1511     GDH Antigen Positive        C difficile Toxin, EIA Positive        Comment: Results verified, phoned to and read back by Joellyn Rued RN AT 1510 ON [id number] [facility]        C Diff Toxin Interpretation       Positive for toxigenic C. difficile                   Diagnostic   CT ABDOMEN PELVIS WO CONTRAST Additional Contrast? None    Result Date: 04/25/2022  1. Diffuse colitis. 2. 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 3. New mild to moderate right pleural effusion with bibasilar atelectasis. New 14 mm left lower lobe nodular lung opacity. Recommend follow-up in 3 months. 4. Refer to above findings for complete details.     XR CHEST  PORTABLE    Result Date: 04/25/2022  Small right pleural effusion with underlying atelectasis.     [place] RETROPERITONEAL COMPLETE    Result Date: 04/27/2022  1.  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 2.  Layering debris in the bladder, correlate with urinalysis. 3.  Splenomegaly. 4.  Small volume ascites and right pleural effusion.      XR ABDOMEN (2 VIEWS)    Result Date: 04/29/2022  1.  No evidence of ileus or obstruction. 2.  Small right and trace left pleural effusions.       Assessment/Plan     Severe C.diff infection w  Diffuse colitis on CT abdo/pel (08/27)         Ongoing loose stools, denies abdominal  pain, flexiseal in place         Afebrile WBC normal at 4.5. Hemodynamically stable         On day # 2 of Fidoxamicin, and day # 6 of IV metronidazole         Routine labs in the morning. Continue probiotics      2. AKI/hypocalcemia and hypokalemia:mild rise in Cr      No nephrolithiasis or hydronephrosis on [place]      3. Suspected UTI w abnormal UA on admission, Cx not done      4. Severe failure to thrive/generalized weakness, rec eval by dietician      5. H/o COPD, Parkinson disease and CVA     Jene Every, MD    04/30/2022

## 2022-04-30 NOTE — Plan of Care (Signed)
Problem: Discharge Planning  Goal: Discharge to home or other facility with appropriate resources  Outcome: Progressing     Problem: Skin/Tissue Integrity  Goal: Absence of new skin breakdown  Description: 1.  Monitor for areas of redness and/or skin breakdown  2.  Assess vascular access sites hourly  3.  Every 4-6 hours minimum:  Change oxygen saturation probe site  4.  Every 4-6 hours:  If on nasal continuous positive airway pressure, respiratory therapy assess nares and determine need for appliance change or resting period.  Outcome: Progressing     Problem: Safety - Adult  Goal: Free from fall injury  Outcome: Progressing     Problem: Skin/Tissue Integrity - Adult  Goal: Skin integrity remains intact  Outcome: Progressing  Flowsheets (Taken 04/30/2022 0900)  Skin Integrity Remains Intact: Monitor for areas of redness and/or skin breakdown  Goal: Incisions, wounds, or drain sites healing without S/S of infection  Outcome: Progressing  Flowsheets (Taken 04/30/2022 0900)  Incisions, Wounds, or Drain Sites Healing Without Sign and Symptoms of Infection:   TWICE DAILY: Assess and document skin integrity   TWICE DAILY: Assess and document dressing/incision, wound bed, drain sites and surrounding tissue  Goal: Oral mucous membranes remain intact  Outcome: Progressing  Flowsheets (Taken 04/30/2022 0900)  Oral Mucous Membranes Remain Intact: Assess oral mucosa and hygiene practices     Problem: Musculoskeletal - Adult  Goal: Return mobility to safest level of function  Outcome: Progressing  Goal: Maintain proper alignment of affected body part  Outcome: Progressing  Goal: Return ADL status to a safe level of function  Outcome: Progressing     Problem: Metabolic/Fluid and Electrolytes - Adult  Goal: Electrolytes maintained within normal limits  Outcome: Progressing  Goal: Hemodynamic stability and optimal renal function maintained  Outcome: Progressing  Goal: Glucose maintained within prescribed range  Outcome: Progressing      Problem: Chronic Conditions and Co-morbidities  Goal: Patient's chronic conditions and co-morbidity symptoms are monitored and maintained or improved  Outcome: Progressing     Problem: Nutrition Deficit:  Goal: Optimize nutritional status  Outcome: Progressing     Problem: Gastrointestinal - Adult  Goal: Minimal or absence of nausea and vomiting  Outcome: Progressing     Problem: Genitourinary - Adult  Goal: Absence of urinary retention  Outcome: Progressing     Problem: Hematologic - Adult  Goal: Maintains hematologic stability  Outcome: Progressing     Problem: Infection - Adult  Goal: Absence of infection at discharge  Outcome: Progressing

## 2022-04-30 NOTE — Care Coordination-Inpatient (Signed)
Patient accepted at [facility], auth received however espires 05/02/2022.  If patient d/c after this date CM will need new PT/OT notes to start a new auth.    CM continues to follow and monitor for needs.

## 2022-04-30 NOTE — Progress Notes (Signed)
OT tx session attempted at 1330, however pt noted with low Hgb at this time so holding tx session this date.  Will continue to follow pt and attempt tx session at a later time as time allows.  Thank you.

## 2022-04-30 NOTE — Progress Notes (Signed)
Hospitalist Progress Note  [facility]    Subjective:   Daily Progress Note: 04/30/2022 11:54 AM    Complaints of nausea patient was seen by dietitian diagnosed with severe protein calorie malnutrition positive for colitis. Diarrhea getting better    Current Facility-Administered Medications   Medication Dose Route Frequency    cholestyramine light packet 4 g  4 g Oral TID    Fidaxomicin (DIFICID) tablet 200 mg  200 mg Oral BID    potassium chloride 20 mEq, sodium bicarbonate 50 mEq in sodium chloride 0.45 % 1,000 mL infusion   IntraVENous Continuous    calcium carbonate (TUMS) chewable tablet 500 mg  500 mg Oral TID    traZODone (DESYREL) tablet 50 mg  50 mg Oral Nightly    sodium bicarbonate tablet 650 mg  650 mg Oral BID    nystatin (MYCOSTATIN) ointment   Topical BID    zinc oxide 40 % paste   Topical BID    acidophilus/citrus pectin 1 tablet  1 tablet Oral Daily    sodium chloride flush 0.9 % injection 5-40 mL  5-40 mL IntraVENous 2 times per day    sodium chloride flush 0.9 % injection 5-40 mL  5-40 mL IntraVENous PRN    0.9 % sodium chloride infusion   IntraVENous PRN    ondansetron (ZOFRAN-ODT) disintegrating tablet 4 mg  4 mg Oral Q8H PRN    Or    ondansetron (ZOFRAN) injection 4 mg  4 mg IntraVENous Q6H PRN    acetaminophen (TYLENOL) tablet 650 mg  650 mg Oral Q6H PRN    Or    acetaminophen (TYLENOL) suppository 650 mg  650 mg Rectal Q6H PRN    metronidazole (FLAGYL) 500 mg in 0.9% NaCl 100 mL IVPB premix  500 mg IntraVENous Q8H        Review of Systems  Constitutional: negative for fevers, chills, sweats, and fatigue  Eyes: negative for redness and icterus  Ears, nose, mouth, throat, and face: negative for ear drainage, nasal congestion, sore throat, and voice change  Respiratory: negative for cough, wheezing, or dyspnea on exertion  Cardiovascular: negative for chest pain, dyspnea, palpitations, lower extremity edema  Gastrointestinal: negative for nausea, vomiting, diarrhea, and  abdominal pain  Genitourinary:negative for frequency, dysuria, and hematuria  Integument/breast: negative for skin lesion(s) and dryness  Hematologic/lymphatic: negative for easy bruising, bleeding, and lymphadenopathy  Musculoskeletal:negative for neck pain, back pain, and muscle weakness  Neurological: negative for headaches, dizziness, and weakness  Behavioral/Psych: negative for anxiety and depression  Allergic/Immunologic: negative for urticaria and angioedema        Objective:     BP 120/63   Pulse 85   Temp 97.9 F (36.6 C) (Oral)   Resp 19   Ht 1.803 m (5' 10.98")   Wt 74.5 kg (164 lb 5.3 oz)   SpO2 100%   BMI 22.93 kg/m         Temp (24hrs), Avg:97.9 F (36.6 C), Min:97.5 F (36.4 C), Max:98.2 F (36.8 C)      No intake/output data recorded.  08/30 1901 - 09/01 0700  In: 3685.3 [P.O.:1147; I.V.:1950]  Out: 950 [Urine:650]    PHYSICAL EXAM:  Skin: Extremities and face reveal no rashes.   HEENT: Sclerae anicteric. Extra-occular muscles are intact. No oral ulcers. No ENT discharge. The neck is supple.   Cardiovascular: Regular rate and rhythm. No murmurs, gallops, or rubs. PMI nondisplaced.   Respiratory:  Clear breath sounds bilaterally with no wheezes,  rales, or rhonchi.   GI: Abdomen nondistended, soft, and nontender. Normal active bowel sounds. No enlargement of the liver or spleen. No masses palpable.   Rectal: Deferred   Musculoskeletal: No pitting edema of the lower legs. Extremities have good range of motion. No costovertebral tenderness.   Neurological:  Patient is alert and oriented. Cranial nerves II-XII grossly intact  Psychiatric: Mood appears appropriate with judgement intact.   Lymphatic: No cervical or supraclavicular adenopathy.    Additional comments:I reviewed the patient's new clinical lab test results.      Data Review    Recent Results (from the past 24 hour(s))   Renal Function Panel    Collection Time: 04/30/22  6:04 AM   Result Value Ref Range    Sodium 143 136 - 145 mmol/L     Potassium 3.5 3.5 - 5.1 mmol/L    Chloride 113 (H) 97 - 108 mmol/L    CO2 22 21 - 32 mmol/L    Anion Gap 8 5 - 15 mmol/L    Glucose 96 65 - 100 mg/dL    BUN 34 (H) 6 - 20 mg/dL    Creatinine 5.57 (H) 0.70 - 1.30 mg/dL    Bun/Cre Ratio 7 (L) 12 - 20      Est, Glom Filt Rate 12 (L) >60 ml/min/1.15m2    Calcium 7.0 (L) 8.5 - 10.1 mg/dL    Phosphorus 3.2 2.6 - 4.7 mg/dL    Albumin 1.9 (L) 3.5 - 5.0 g/dL   CBC with Auto Differential    Collection Time: 04/30/22  6:06 AM   Result Value Ref Range    WBC 4.5 4.1 - 11.1 K/uL    RBC 2.55 (L) 4.10 - 5.70 M/uL    Hemoglobin 6.9 (L) 12.1 - 17.0 g/dL    Hematocrit 32.2 (L) 36.6 - 50.3 %    MCV 86.7 80.0 - 99.0 FL    MCH 27.1 26.0 - 34.0 PG    MCHC 31.2 30.0 - 36.5 g/dL    RDW 02.5 (H) 42.7 - 14.5 %    Platelets 97 (L) 150 - 400 K/uL    MPV 9.1 8.9 - 12.9 FL    Nucleated RBCs 0.0 0.0 PER 100 WBC    nRBC 0.00 0.00 - 0.01 K/uL    Neutrophils % 67 32 - 75 %    Lymphocytes % 22 12 - 49 %    Monocytes % 8 5 - 13 %    Eosinophils % 2 0 - 7 %    Basophils % 0 0 - 1 %    Immature Granulocytes 1 (H) 0 - 0.5 %    Neutrophils Absolute 3.0 1.8 - 8.0 K/UL    Lymphocytes Absolute 1.0 0.8 - 3.5 K/UL    Monocytes Absolute 0.4 0.0 - 1.0 K/UL    Eosinophils Absolute 0.1 0.0 - 0.4 K/UL    Basophils Absolute 0.0 0.0 - 0.1 K/UL    Absolute Immature Granulocyte 0.0 0.00 - 0.04 K/UL    Differential Type Smear Scanned      RBC Comment Anisocytosis  1+        RBC Comment Microcytosis  1+             Assessment/Plan:     Principal Problem:    Acute kidney injury (AKI) with acute tubular necrosis (ATN) (HCC)  Active Problems:    Acute kidney injury (HCC)    Clostridioides difficile infection    Colitis    Hypokalemia  Resolved Problems:    * No resolved hospital problems. *        Care Plan discussed with: Patient/Family    Total time spent with patient: 45   minutes.

## 2022-04-30 NOTE — Progress Notes (Signed)
This nurse removed rectal tube PER orders patient has no complications at this time this nurse will continue to follow

## 2022-04-30 NOTE — Progress Notes (Signed)
[facility]     Renal Daily Progress Note:     Subjective: Looks remarkably improved after getting IV fluids.  Feels better.  Hungry.  Diarrhea still present but may be improved a bit; has rectal tube/ fecal management system.  No pain, no shortness of breath, no nausea or vomiting. Serum creatine up a bit. Major complaint is inability to sleep    04/30/22 "I need help. I just had a BM". No symptoms were offered.    Review of Systems  Pertinent items are noted in HPI.    Objective:     BP 120/63   Pulse 85   Temp 97.9 F (36.6 C) (Oral)   Resp 19   Ht 1.803 m (5' 10.98")   Wt 74.5 kg (164 lb 5.3 oz)   SpO2 100%   BMI 22.93 kg/m   Temp (24hrs), Avg:98 F (36.7 C), Min:97.7 F (36.5 C), Max:98.2 F (36.8 C)        Intake/Output Summary (Last 24 hours) at 04/30/2022 1522  Last data filed at 04/30/2022 1258  Gross per 24 hour   Intake 3658.33 ml   Output 450 ml   Net 3208.33 ml       Current Facility-Administered Medications   Medication Dose Route Frequency    cholestyramine light packet 4 g  4 g Oral TID    Fidaxomicin (DIFICID) tablet 200 mg  200 mg Oral BID    potassium chloride 20 mEq, sodium bicarbonate 50 mEq in sodium chloride 0.45 % 1,000 mL infusion   IntraVENous Continuous    calcium carbonate (TUMS) chewable tablet 500 mg  500 mg Oral TID    traZODone (DESYREL) tablet 50 mg  50 mg Oral Nightly    sodium bicarbonate tablet 650 mg  650 mg Oral BID    nystatin (MYCOSTATIN) ointment   Topical BID    zinc oxide 40 % paste   Topical BID    acidophilus/citrus pectin 1 tablet  1 tablet Oral Daily    sodium chloride flush 0.9 % injection 5-40 mL  5-40 mL IntraVENous 2 times per day    sodium chloride flush 0.9 % injection 5-40 mL  5-40 mL IntraVENous PRN    0.9 % sodium chloride infusion   IntraVENous PRN    ondansetron (ZOFRAN-ODT) disintegrating tablet 4 mg  4 mg Oral Q8H PRN    Or    ondansetron (ZOFRAN) injection 4 mg  4 mg IntraVENous Q6H PRN    acetaminophen (TYLENOL) tablet 650 mg  650 mg Oral  Q6H PRN    Or    acetaminophen (TYLENOL) suppository 650 mg  650 mg Rectal Q6H PRN    metronidazole (FLAGYL) 500 mg in 0.9% NaCl 100 mL IVPB premix  500 mg IntraVENous Q8H       Physical Exam: appears stated age, cooperative, alert, more interactive and no distress  Head: Normocephalic, without obvious abnormality, atraumatic  Eyes:  anicteric  Neck: no adenopathy, no JVD, supple, symmetrical, trachea midline, and thyroid not enlarged, symmetric,  Lungs: clear to auscultation bilaterally  Heart:  regular rate and rhythm, and no S3 or S4  Abdomen: soft, non-tender; bowel sounds normal; no masses,  no organomegaly  Extremities:  no edema  Neurologic: Grossly normal -alert and oriented x3       Data Review:     LABS:  Recent Labs     04/30/22  0604 04/29/22  0645 04/28/22  2952 04/27/22  0720 04/27/22  0719 2022-04-27  8413  NA 143 142 142   < >  --  141   K 3.5 3.2* 2.8*   < > 4.5 2.5*   CL 113* 112* 111*   < >  --  111*   CO2 22 22 22    < >  --  20*   BUN 34* 30* 28*   < >  --  27*   CREATININE 4.75* 4.49* 4.10*   < >  --  3.50*   CALCIUM 7.0* 6.8* 6.6*   < >  --  6.6*   LABALBU 1.9* 1.6* 1.6*   < >  --  1.5*   PHOS 3.2 3.8 3.8   < >  --  4.7   MG  --  2.0  --   --  1.7 2.1    < > = values in this interval not displayed.       Recent Labs     04/30/22  0606 04/29/22  0645 04/28/22  0643   WBC 4.5 4.0* 3.6*   HGB 6.9* 7.0* 7.2*   HCT 22.1* 22.1* 23.5*   PLT 97* 89* 106*       No results for input(s): NAU, KU, CLU, CREAU in the last 720 hours.    Invalid input(s): PROU    Radiology  CT ABDOMEN PELVIS WO CONTRAST Additional Contrast? None    Result Date: 04/25/2022  1. Diffuse colitis. 2. 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 3. New mild to moderate right pleural effusion with bibasilar atelectasis. New 14 mm left lower lobe nodular lung opacity. Recommend follow-up in 3 months. 4. Refer to above findings for complete details.     XR CHEST PORTABLE    Result Date: 04/25/2022  Small right pleural effusion with underlying  atelectasis.      Assessment:   Renal Specific Problems  AKI- creatinine is stable at this level.  Hypokalemia- corrected  Hypoalbumin  Metabolic acidosis-resolved  C. difficile with significant diarrhea > 10 days--improving  Anorexia resolving, patient now hungry      Plan:     Obtain/ Order: labs/cultures/radiology/procedures:  renal panel in a.m.      Therapeutic:    Increase nutritional support  Oral KCL  Oral NaHCO3  Continue IV fluids   Avoid nonsteroidal anti-inflammatory agents  Trazodone for sleep  IV albumin

## 2022-04-30 NOTE — Progress Notes (Signed)
Comprehensive Nutrition Assessment    Type and Reason for Visit:  Reassess (Early Goal)    Nutrition Recommendations/Plan:   Continue ETC diet and snacks between meals  Ensure Enlive 2x/d (700 kcals, 40 gm pro)  Ensure HP 1x/d (160 kcals, 16 gm pro)  Juven 2x/day (180 kcals, 5 gm pro)  Provide meal assistance as needed/able   Continue to document PO/ONS intake in I/Os     Malnutrition Assessment:  Malnutrition Status:  Severe malnutrition (04/29/2022 1257)    Context:  Acute Illness     Findings of the 6 clinical characteristics of malnutrition:  Energy Intake:  50% or less of estimated energy requirements for 5 or more days (decreased intake x1-2 wks, minimal intakes PTA per pt report)  Weight Loss:  Greater than 7.5% over 3 months (~22% wt loss x 3 mo. per EMR; nutritionally significant wt loss per pt report x2-3 mo.)     Body Fat Loss:  Mild body fat loss (per observational NFPE r/t contact iso.) Orbital   Muscle Mass Loss:  Moderate muscle mass loss Temples (temporalis), Clavicles (pectoralis & deltoids) (per observational NFPE r/t contact iso.)  Fluid Accumulation:  No significant fluid accumulation    Grip Strength:  Not Performed    Nutrition Assessment:    Presents to ED from [facility] for evaluation of generalized weakness, decreased PO intake, and abdominal discomfort ongoing x 1 week. No substantial PO intake x 1 week, multiple episodes of loose stools, and lack of appetite per pt family per H&P. ~22% wt loss x 3 months per EMR, nutritionally significant if documented wt's are accurate (no method recorded/stated wt). Pt noted decreasing wt trends x 2-3 months (246# - 158#, 33% wt loss - nutritionally significant if accurate report). Pt reported decreased appetite/intakes x 1-2 wks. Limited PO intake PTA (few eggs/day per pt report). Pt on full liquid diet (coffee for B). Pt amenable to starting ONS to better meet EER and to promote wound healing. Plan to monitor intakes and adjust ONS rec'd  prn. Rec'd to advance diet when medically appropriate. (8/30) Chart reviewed for interim/consult for unintended wt loss. Diet advanced to ETC. Pt with poor intakes (0% x 2 meals 8/29, 0% B per pt report). Suspect pt's decreased intake may be r/t food preferences - pt noted disliking meals. Food preferences obtained and recorded. Pt enjoying Ensure - increase to 2x/day. Continue Juven 2x/d to promote wound healing. Pt amenable to snacks between meals to encourage PO intake. Continue to monitor intakes, consider nutrition support if congruent w/ GOC if pt continues to meet <60% of EER. (9/1) Pt endorsed ~50% intake at B, 100% intake of snacks (chicken salad), and 100% intake of ONS. Pt highly motivated to improve PO intakes. Food preferences obtained and recorded. Pt noted difficulty eating foods on tray that require both hands/arms (difficulty moving L arm) - RN notified, will provide assistance prn. Continue ETC diet, increase frequency of ONS per pt request. Intakes increasing, continue to consider nutrition support if meeting <60% of EER if congruent w/ goals of care. Labs: Cl 113, BUN 34, Creat 4.75, GFR 12, Alb 1.9, H&H 6.9/22.1. Meds: Citrus pectin, TUMS, Cholestyramine light pkt, Dificid, Nystatin, Sodium bicarb, NaCl, Desyrel, Kcl, Zofran.    Nutrition Related Findings:    LBM 8/31, no edema noted, no hx of c/s issues - pt noted difficulty c/s issues only when w/o dentures. No N/V, +D, improving per documentation, pt w/ rectal tube. NFPE deferred d/t contact iso.;  observed moderate wasting per observational NFPE. Wound Type: Multiple, Pressure Injury, Stage II, Surgical Incision (Stage 2 PI (sacral), Incision (L thigh))       Current Nutrition Intake & Therapies:    Average Meal Intake: 26-50%  Average Supplements Intake: 76-100% (per pt report (Juven + Ensure))  ADULT ORAL NUTRITION SUPPLEMENT; Breakfast, Dinner; Wound Healing Oral Supplement  ADULT ORAL NUTRITION SUPPLEMENT; Lunch, Dinner; Standard High  Calorie/High Protein Oral Supplement  ADULT DIET; Easy to Chew; Likes: eggs/coffee (B), Sandwiches w/ lettuce, tomato, mayo (L), Hamburger steak (D)  ADULT ORAL NUTRITION SUPPLEMENT; Breakfast; Diabetic Oral Supplement    Anthropometric Measures:  Height: 180.3 cm (5' 10.98")  Ideal Body Weight (IBW): 172 lbs (78 kg)       Current Body Weight: 164 lb 3.9 oz (74.5 kg), 95.5 % IBW. Weight Source: Bed Scale  Current BMI (kg/m2): 22.9        Weight Adjustment For: No Adjustment                 BMI Categories: Normal Weight (BMI 22.0 to 24.9) age over 69    Estimated Daily Nutrient Needs:  Energy Requirements Based On: Kcal/kg  Weight Used for Energy Requirements: Current  Energy (kcal/day): 1863 - 2235 kcals/day (25 - 30 kcals/kg)  Weight Used for Protein Requirements: Current  Protein (g/day): 89 - 104 g/day (1.2 - 1.4 - wound)  Method Used for Fluid Requirements: 1 ml/kcal  Fluid (ml/day): 1610 - 2235 ml/day (64m/kcal)    Nutrition Diagnosis:   Severe malnutrition related to inadequate protein-energy intake as evidenced by Criteria as identified in malnutrition assessment    Nutrition Interventions:   Food and/or Nutrient Delivery: Continue Current Diet, Continue Oral Nutrition Supplement, Modify Oral Nutrition Supplement  Nutrition Education/Counseling: No recommendation at this time  Coordination of Nutrition Care: Continue to monitor while inpatient, Feeding Assistance/Environment Change  Plan of Care discussed with: pt, RN    Goals:  Previous Goal Met: Goal(s) Achieved  Goals: PO intake 75% or greater, by next RD assessment       Nutrition Monitoring and Evaluation:   Behavioral-Environmental Outcomes: None Identified  Food/Nutrient Intake Outcomes: Food and Nutrient Intake, Supplement Intake, Enteral Nutrition Intake/Tolerance  Physical Signs/Symptoms Outcomes: Skin, Weight, Meal Time Behavior, Diarrhea    Discharge Planning:    Continue Oral Nutrition Supplement, Continue current diet     EAudley Hose  RD  Contact: [phone]

## 2022-04-30 DEATH — deceased

## 2022-05-01 LAB — CBC WITH AUTO DIFFERENTIAL
Absolute Immature Granulocyte: 0.1 10*3/uL — ABNORMAL HIGH (ref 0.00–0.04)
Basophils %: 0 % (ref 0–1)
Basophils Absolute: 0 10*3/uL (ref 0.0–0.1)
Eosinophils %: 1 % (ref 0–7)
Eosinophils Absolute: 0.1 10*3/uL (ref 0.0–0.4)
Hematocrit: 24.2 % — ABNORMAL LOW (ref 36.6–50.3)
Hemoglobin: 7.4 g/dL — ABNORMAL LOW (ref 12.1–17.0)
Immature Granulocytes: 1 % — ABNORMAL HIGH (ref 0–0.5)
Lymphocytes %: 29 % (ref 12–49)
Lymphocytes Absolute: 1.3 10*3/uL (ref 0.8–3.5)
MCH: 26.4 PG (ref 26.0–34.0)
MCHC: 30.6 g/dL (ref 30.0–36.5)
MCV: 86.4 FL (ref 80.0–99.0)
MPV: 8.9 FL (ref 8.9–12.9)
Monocytes %: 8 % (ref 5–13)
Monocytes Absolute: 0.4 10*3/uL (ref 0.0–1.0)
Neutrophils %: 61 % (ref 32–75)
Neutrophils Absolute: 2.7 10*3/uL (ref 1.8–8.0)
Nucleated RBCs: 0 PER 100 WBC
Platelets: 76 10*3/uL — ABNORMAL LOW (ref 150–400)
RBC: 2.8 M/uL — ABNORMAL LOW (ref 4.10–5.70)
RDW: 24.8 % — ABNORMAL HIGH (ref 11.5–14.5)
WBC: 4.5 10*3/uL (ref 4.1–11.1)
nRBC: 0 10*3/uL (ref 0.00–0.01)

## 2022-05-01 LAB — BASIC METABOLIC PANEL
Anion Gap: 8 mmol/L (ref 5–15)
BUN: 35 mg/dL — ABNORMAL HIGH (ref 6–20)
Bun/Cre Ratio: 7 — ABNORMAL LOW (ref 12–20)
CO2: 22 mmol/L (ref 21–32)
Calcium: 7 mg/dL — ABNORMAL LOW (ref 8.5–10.1)
Chloride: 114 mmol/L — ABNORMAL HIGH (ref 97–108)
Creatinine: 5.17 mg/dL — ABNORMAL HIGH (ref 0.70–1.30)
Est, Glom Filt Rate: 11 mL/min/{1.73_m2} — ABNORMAL LOW (ref >60)
Glucose: 96 mg/dL (ref 65–100)
Potassium: 3.3 mmol/L — ABNORMAL LOW (ref 3.5–5.1)
Sodium: 144 mmol/L (ref 136–145)

## 2022-05-01 MED ORDER — POTASSIUM BICARB-CITRIC ACID 20 MEQ PO TBEF
20 MEQ | Freq: Once | ORAL | Status: AC
Start: 2022-05-01 — End: 2022-05-01
  Administered 2022-05-01: 20:00:00 40 meq via ORAL

## 2022-05-01 MED ORDER — SODIUM CHLORIDE 0.45 % IV SOLN
0.45 % | INTRAVENOUS | Status: AC
Start: 2022-05-01 — End: 2022-05-01
  Administered 2022-05-01 (×2): via INTRAVENOUS

## 2022-05-01 MED FILL — DIFICID 200 MG PO TABS: 200 MG | ORAL | Qty: 1

## 2022-05-01 MED FILL — SODIUM CHLORIDE 0.45 % IV SOLN: 0.45 % | INTRAVENOUS | Qty: 1000

## 2022-05-01 NOTE — Plan of Care (Signed)
Problem: Discharge Planning  Goal: Discharge to home or other facility with appropriate resources  Outcome: Progressing     Problem: Skin/Tissue Integrity  Goal: Absence of new skin breakdown  Description: 1.  Monitor for areas of redness and/or skin breakdown  2.  Assess vascular access sites hourly  3.  Every 4-6 hours minimum:  Change oxygen saturation probe site  4.  Every 4-6 hours:  If on nasal continuous positive airway pressure, respiratory therapy assess nares and determine need for appliance change or resting period.  Outcome: Progressing     Problem: Safety - Adult  Goal: Free from fall injury  Outcome: Progressing     Problem: Skin/Tissue Integrity - Adult  Goal: Skin integrity remains intact  Outcome: Progressing  Goal: Incisions, wounds, or drain sites healing without S/S of infection  Outcome: Progressing  Goal: Oral mucous membranes remain intact  Outcome: Progressing     Problem: Musculoskeletal - Adult  Goal: Return mobility to safest level of function  Outcome: Progressing  Goal: Maintain proper alignment of affected body part  Outcome: Progressing  Goal: Return ADL status to a safe level of function  Outcome: Progressing     Problem: Metabolic/Fluid and Electrolytes - Adult  Goal: Electrolytes maintained within normal limits  Outcome: Progressing  Goal: Hemodynamic stability and optimal renal function maintained  Outcome: Progressing  Goal: Glucose maintained within prescribed range  Outcome: Progressing     Problem: Gastrointestinal - Adult  Goal: Minimal or absence of nausea and vomiting  Outcome: Progressing     Problem: Genitourinary - Adult  Goal: Absence of urinary retention  Outcome: Progressing     Problem: Hematologic - Adult  Goal: Maintains hematologic stability  Outcome: Progressing     Problem: Infection - Adult  Goal: Absence of infection at discharge  Outcome: Progressing     Problem: Chronic Conditions and Co-morbidities  Goal: Patient's chronic conditions and co-morbidity  symptoms are monitored and maintained or improved  Outcome: Progressing     Problem: Nutrition Deficit:  Goal: Optimize nutritional status  Outcome: Progressing

## 2022-05-01 NOTE — Progress Notes (Signed)
[facility]     Renal Daily Progress Note:     Subjective: Looks remarkably improved after getting IV fluids.  Feels better.  Hungry.  Diarrhea still present but may be improved a bit; has rectal tube/ fecal management system.  No pain, no shortness of breath, no nausea or vomiting. Serum creatine up a bit. Major complaint is inability to sleep    04/30/22 "I need help. I just had a BM". No symptoms were offered.    9/2-feels better PT.  Appetite improving.  Creatinine higher today.  Potassium low.    Review of Systems  Pertinent items are noted in HPI.    Objective:     BP (!) 118/48 Comment: His nurse Nehemiah Settle was notified of low bottom number on BP.  Pulse 82   Temp 98.2 F (36.8 C) (Oral)   Resp 18   Ht 1.803 m (5' 10.98")   Wt 74.5 kg (164 lb 5.3 oz)   SpO2 100%   BMI 22.93 kg/m   Temp (24hrs), Avg:98.1 F (36.7 C), Min:97.5 F (36.4 C), Max:98.6 F (37 C)        Intake/Output Summary (Last 24 hours) at 05/01/2022 1443  Last data filed at 05/01/2022 1610  Gross per 24 hour   Intake 100 ml   Output 700 ml   Net -600 ml       Current Facility-Administered Medications   Medication Dose Route Frequency    potassium chloride 20 mEq, sodium bicarbonate 50 mEq in sodium chloride 0.45 % 1,000 mL infusion   IntraVENous Continuous    cholestyramine light packet 4 g  4 g Oral TID    Fidaxomicin (DIFICID) tablet 200 mg  200 mg Oral BID    calcium carbonate (TUMS) chewable tablet 500 mg  500 mg Oral TID    traZODone (DESYREL) tablet 50 mg  50 mg Oral Nightly    sodium bicarbonate tablet 650 mg  650 mg Oral BID    nystatin (MYCOSTATIN) ointment   Topical BID    zinc oxide 40 % paste   Topical BID    acidophilus/citrus pectin 1 tablet  1 tablet Oral Daily    sodium chloride flush 0.9 % injection 5-40 mL  5-40 mL IntraVENous 2 times per day    sodium chloride flush 0.9 % injection 5-40 mL  5-40 mL IntraVENous PRN    0.9 % sodium chloride infusion   IntraVENous PRN    ondansetron (ZOFRAN-ODT) disintegrating tablet  4 mg  4 mg Oral Q8H PRN    Or    ondansetron (ZOFRAN) injection 4 mg  4 mg IntraVENous Q6H PRN    acetaminophen (TYLENOL) tablet 650 mg  650 mg Oral Q6H PRN    Or    acetaminophen (TYLENOL) suppository 650 mg  650 mg Rectal Q6H PRN    metronidazole (FLAGYL) 500 mg in 0.9% NaCl 100 mL IVPB premix  500 mg IntraVENous Q8H       Physical Exam: appears stated age, cooperative, alert, more interactive and no distress  Head: Normocephalic, without obvious abnormality, atraumatic  Eyes:  anicteric  Neck: no adenopathy, no JVD, supple, symmetrical, trachea midline, and thyroid not enlarged, symmetric,  Lungs: clear to auscultation bilaterally  Heart:  regular rate and rhythm, and no S3 or S4  Abdomen: soft, non-tender; bowel sounds normal; no masses,  no organomegaly  Extremities:  no edema  Neurologic: Grossly normal -alert and oriented x3       Data Review:  LABS:  Recent Labs     05/01/22  0519 04/30/22  0604 04/29/22  0645 04/28/22  1610 04/27/22  0720 04/27/22  0719 May 25, 2022  0708   NA 144 143 142 142   < >  --  141   K 3.3* 3.5 3.2* 2.8*   < > 4.5 2.5*   CL 114* 113* 112* 111*   < >  --  111*   CO2 22 22 22 22    < >  --  20*   BUN 35* 34* 30* 28*   < >  --  27*   CREATININE 5.17* 4.75* 4.49* 4.10*   < >  --  3.50*   CALCIUM 7.0* 7.0* 6.8* 6.6*   < >  --  6.6*   LABALBU  --  1.9* 1.6* 1.6*   < >  --  1.5*   PHOS  --  3.2 3.8 3.8   < >  --  4.7   MG  --   --  2.0  --   --  1.7 2.1    < > = values in this interval not displayed.       Recent Labs     05/01/22  0519 04/30/22  0606 04/29/22  0645   WBC 4.5 4.5 4.0*   HGB 7.4* 6.9* 7.0*   HCT 24.2* 22.1* 22.1*   PLT 76* 97* 89*       No results for input(s): NAU, KU, CLU, CREAU in the last 720 hours.    Invalid input(s): PROU    Radiology  CT ABDOMEN PELVIS WO CONTRAST Additional Contrast? None    Result Date: 04/25/2022  1. Diffuse colitis. 2. 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 3. New mild to moderate right pleural effusion with bibasilar atelectasis. New 14 mm left  lower lobe nodular lung opacity. Recommend follow-up in 3 months. 4. Refer to above findings for complete details.     XR CHEST PORTABLE    Result Date: 04/25/2022  Small right pleural effusion with underlying atelectasis.      Assessment:   Renal Specific Problems  AKI- renal function worsening partly might be due to urinary retention  Hypokalemia-  Hypoalbumin  Metabolic acidosis-resolved  C. difficile with significant diarrhea > 10 days--improving  Anorexia resolving, patient now hungry  UTI? No culture sent  Urinary retentiuon, PVR 380 ml      Plan:     Obtain/ Order: labs/cultures/radiology/procedures:  renal panel in a.m.      Therapeutic:    Increase nutritional support  Oral KCL ordered  Check magnesium and phosphorus  Oral NaHCO3  Continue IV fluids current rate  Foley  Strict Is and Os  No indication for RRT  Avoid nonsteroidal anti-inflammatory agents  Trazodone for sleep  IV albumin  Follow-up renal function daily

## 2022-05-01 NOTE — Progress Notes (Signed)
Hospitalist Progress Note  [facility]    Subjective:   Daily Progress Note: 05/01/2022 2:00 PM  Patient's diarrhea is still ongoing was seen by nephrologist on infectious disease presently on Dificid    Current Facility-Administered Medications   Medication Dose Route Frequency    potassium chloride 20 mEq, sodium bicarbonate 50 mEq in sodium chloride 0.45 % 1,000 mL infusion   IntraVENous Continuous    cholestyramine light packet 4 g  4 g Oral TID    Fidaxomicin (DIFICID) tablet 200 mg  200 mg Oral BID    calcium carbonate (TUMS) chewable tablet 500 mg  500 mg Oral TID    traZODone (DESYREL) tablet 50 mg  50 mg Oral Nightly    sodium bicarbonate tablet 650 mg  650 mg Oral BID    nystatin (MYCOSTATIN) ointment   Topical BID    zinc oxide 40 % paste   Topical BID    acidophilus/citrus pectin 1 tablet  1 tablet Oral Daily    sodium chloride flush 0.9 % injection 5-40 mL  5-40 mL IntraVENous 2 times per day    sodium chloride flush 0.9 % injection 5-40 mL  5-40 mL IntraVENous PRN    0.9 % sodium chloride infusion   IntraVENous PRN    ondansetron (ZOFRAN-ODT) disintegrating tablet 4 mg  4 mg Oral Q8H PRN    Or    ondansetron (ZOFRAN) injection 4 mg  4 mg IntraVENous Q6H PRN    acetaminophen (TYLENOL) tablet 650 mg  650 mg Oral Q6H PRN    Or    acetaminophen (TYLENOL) suppository 650 mg  650 mg Rectal Q6H PRN    metronidazole (FLAGYL) 500 mg in 0.9% NaCl 100 mL IVPB premix  500 mg IntraVENous Q8H        Review of Systems  Constitutional: negative for fevers, chills, sweats, and fatigue  Eyes: negative for redness and icterus  Ears, nose, mouth, throat, and face: negative for ear drainage, nasal congestion, sore throat, and voice change  Respiratory: negative for cough, wheezing, or dyspnea on exertion  Cardiovascular: negative for chest pain, dyspnea, palpitations, lower extremity edema  Gastrointestinal: negative for nausea, vomiting, diarrhea, and abdominal pain  Genitourinary:negative for  frequency, dysuria, and hematuria  Integument/breast: negative for skin lesion(s) and dryness  Hematologic/lymphatic: negative for easy bruising, bleeding, and lymphadenopathy  Musculoskeletal:negative for neck pain, back pain, and muscle weakness  Neurological: negative for headaches, dizziness, and weakness  Behavioral/Psych: negative for anxiety and depression  Allergic/Immunologic: negative for urticaria and angioedema        Objective:     BP (!) 118/48 Comment: His nurse Nehemiah Settle was notified of low bottom number on BP.  Pulse 82   Temp 98.2 F (36.8 C) (Oral)   Resp 18   Ht 1.803 m (5' 10.98")   Wt 74.5 kg (164 lb 5.3 oz)   SpO2 100%   BMI 22.93 kg/m         Temp (24hrs), Avg:98.1 F (36.7 C), Min:97.5 F (36.4 C), Max:98.6 F (37 C)      09/02 0701 - 09/02 1900  In: 100 [P.O.:100]  Out: 500   08/31 1901 - 09/02 0700  In: 820 [P.O.:820]  Out: 350 [Urine:350]    PHYSICAL EXAM:  Skin: Extremities and face reveal no rashes.   HEENT: Sclerae anicteric. Extra-occular muscles are intact. No oral ulcers. No ENT discharge. The neck is supple.   Cardiovascular: Regular rate and rhythm. No murmurs, gallops, or rubs.  PMI nondisplaced.   Respiratory:  Clear breath sounds bilaterally with no wheezes, rales, or rhonchi.   GI: Abdomen nondistended, soft, and nontender. Normal active bowel sounds. No enlargement of the liver or spleen. No masses palpable.   Rectal: Deferred   Musculoskeletal: No pitting edema of the lower legs. Extremities have good range of motion. No costovertebral tenderness.   Neurological:  Patient is alert and oriented. Cranial nerves II-XII grossly intact  Psychiatric: Mood appears appropriate with judgement intact.   Lymphatic: No cervical or supraclavicular adenopathy.    Additional comments:I reviewed the patient's new clinical lab test results.      Data Review    Recent Results (from the past 24 hour(s))   Basic Metabolic Panel    Collection Time: 05/01/22  5:19 AM   Result Value Ref  Range    Sodium 144 136 - 145 mmol/L    Potassium 3.3 (L) 3.5 - 5.1 mmol/L    Chloride 114 (H) 97 - 108 mmol/L    CO2 22 21 - 32 mmol/L    Anion Gap 8 5 - 15 mmol/L    Glucose 96 65 - 100 mg/dL    BUN 35 (H) 6 - 20 mg/dL    Creatinine 5.97 (H) 0.70 - 1.30 mg/dL    Bun/Cre Ratio 7 (L) 12 - 20      Est, Glom Filt Rate 11 (L) >60 ml/min/1.69m2    Calcium 7.0 (L) 8.5 - 10.1 mg/dL   CBC with Auto Differential    Collection Time: 05/01/22  5:19 AM   Result Value Ref Range    WBC 4.5 4.1 - 11.1 K/uL    RBC 2.80 (L) 4.10 - 5.70 M/uL    Hemoglobin 7.4 (L) 12.1 - 17.0 g/dL    Hematocrit 41.6 (L) 36.6 - 50.3 %    MCV 86.4 80.0 - 99.0 FL    MCH 26.4 26.0 - 34.0 PG    MCHC 30.6 30.0 - 36.5 g/dL    RDW 38.4 (H) 53.6 - 14.5 %    Platelets 76 (L) 150 - 400 K/uL    MPV 8.9 8.9 - 12.9 FL    Nucleated RBCs 0.0 0.0 PER 100 WBC    nRBC 0.00 0.00 - 0.01 K/uL    Neutrophils % 61 32 - 75 %    Lymphocytes % 29 12 - 49 %    Monocytes % 8 5 - 13 %    Eosinophils % 1 0 - 7 %    Basophils % 0 0 - 1 %    Immature Granulocytes 1 (H) 0 - 0.5 %    Neutrophils Absolute 2.7 1.8 - 8.0 K/UL    Lymphocytes Absolute 1.3 0.8 - 3.5 K/UL    Monocytes Absolute 0.4 0.0 - 1.0 K/UL    Eosinophils Absolute 0.1 0.0 - 0.4 K/UL    Basophils Absolute 0.0 0.0 - 0.1 K/UL    Absolute Immature Granulocyte 0.1 (H) 0.00 - 0.04 K/UL    Differential Type AUTOMATED           Assessment/Plan:     Principal Problem:    Acute kidney injury (AKI) with acute tubular necrosis (ATN) (HCC)  Active Problems:    Acute kidney injury (HCC)    Clostridioides difficile infection    Colitis    Hypokalemia  Resolved Problems:    * No resolved hospital problems. *      C. difficile colitis  Care Plan discussed with: Patient/Family    Total  time spent with patient: 45   minutes.

## 2022-05-02 LAB — BASIC METABOLIC PANEL
Anion Gap: 7 mmol/L (ref 5–15)
BUN: 38 mg/dL — ABNORMAL HIGH (ref 6–20)
Bun/Cre Ratio: 7 — ABNORMAL LOW (ref 12–20)
CO2: 22 mmol/L (ref 21–32)
Calcium: 7.3 mg/dL — ABNORMAL LOW (ref 8.5–10.1)
Chloride: 114 mmol/L — ABNORMAL HIGH (ref 97–108)
Creatinine: 5.47 mg/dL — ABNORMAL HIGH (ref 0.70–1.30)
Est, Glom Filt Rate: 10 mL/min/{1.73_m2} — ABNORMAL LOW (ref >60)
Glucose: 126 mg/dL — ABNORMAL HIGH (ref 65–100)
Potassium: 3.2 mmol/L — ABNORMAL LOW (ref 3.5–5.1)
Sodium: 143 mmol/L (ref 136–145)

## 2022-05-02 LAB — MAGNESIUM: Magnesium: 2 mg/dL (ref 1.6–2.4)

## 2022-05-02 LAB — PHOSPHORUS: Phosphorus: 4.1 mg/dL (ref 2.6–4.7)

## 2022-05-02 MED ORDER — POTASSIUM BICARB-CITRIC ACID 20 MEQ PO TBEF
20 MEQ | ORAL | Status: AC
Start: 2022-05-02 — End: 2022-05-02
  Administered 2022-05-02 (×2): 40 meq via ORAL

## 2022-05-02 MED ORDER — LACTATED RINGERS IV SOLN
INTRAVENOUS | Status: DC
Start: 2022-05-02 — End: 2022-05-03
  Administered 2022-05-02 – 2022-05-03 (×3): via INTRAVENOUS

## 2022-05-02 MED FILL — LACTATED RINGERS IV SOLN: INTRAVENOUS | Qty: 1000

## 2022-05-02 MED FILL — DIFICID 200 MG PO TABS: 200 MG | ORAL | Qty: 1

## 2022-05-02 MED FILL — EFFER-K 20 MEQ PO TBEF: 20 MEQ | ORAL | Qty: 2

## 2022-05-02 MED FILL — DESITIN 40 % EX PSTE: 40 % | CUTANEOUS | Qty: 57

## 2022-05-02 NOTE — Plan of Care (Signed)
Problem: Discharge Planning  Goal: Discharge to home or other facility with appropriate resources  Outcome: Progressing     Problem: Skin/Tissue Integrity  Goal: Absence of new skin breakdown  Description: 1.  Monitor for areas of redness and/or skin breakdown  2.  Assess vascular access sites hourly  3.  Every 4-6 hours minimum:  Change oxygen saturation probe site  4.  Every 4-6 hours:  If on nasal continuous positive airway pressure, respiratory therapy assess nares and determine need for appliance change or resting period.  Outcome: Progressing     Problem: Safety - Adult  Goal: Free from fall injury  Outcome: Progressing     Problem: Skin/Tissue Integrity - Adult  Goal: Skin integrity remains intact  Outcome: Progressing  Flowsheets (Taken 05/02/2022 0800)  Skin Integrity Remains Intact: Monitor for areas of redness and/or skin breakdown  Goal: Incisions, wounds, or drain sites healing without S/S of infection  Outcome: Progressing  Goal: Oral mucous membranes remain intact  Outcome: Progressing     Problem: Musculoskeletal - Adult  Goal: Return mobility to safest level of function  Outcome: Progressing  Goal: Maintain proper alignment of affected body part  Outcome: Progressing  Goal: Return ADL status to a safe level of function  Outcome: Progressing     Problem: Metabolic/Fluid and Electrolytes - Adult  Goal: Electrolytes maintained within normal limits  Outcome: Progressing  Goal: Hemodynamic stability and optimal renal function maintained  Outcome: Progressing  Goal: Glucose maintained within prescribed range  Outcome: Progressing     Problem: Chronic Conditions and Co-morbidities  Goal: Patient's chronic conditions and co-morbidity symptoms are monitored and maintained or improved  Outcome: Progressing     Problem: Nutrition Deficit:  Goal: Optimize nutritional status  Outcome: Progressing     Problem: Gastrointestinal - Adult  Goal: Minimal or absence of nausea and vomiting  Outcome: Progressing      Problem: Genitourinary - Adult  Goal: Absence of urinary retention  Outcome: Progressing     Problem: Hematologic - Adult  Goal: Maintains hematologic stability  Outcome: Progressing     Problem: Infection - Adult  Goal: Absence of infection at discharge  Outcome: Progressing

## 2022-05-02 NOTE — Progress Notes (Signed)
[facility]     Renal Daily Progress Note:     Subjective: Looks remarkably improved after getting IV fluids.  Feels better.  Hungry.  Diarrhea still present but may be improved a bit; has rectal tube/ fecal management system.  No pain, no shortness of breath, no nausea or vomiting. Serum creatine up a bit. Major complaint is inability to sleep    9/3-still has diarrhea, lo urine output. Has foley, urine dark. Off IVF? Renal function worsening    Review of Systems  Pertinent items are noted in HPI.    Objective:     BP 119/71   Pulse 90   Temp 98.1 F (36.7 C) (Oral)   Resp 17   Ht 1.803 m (5' 10.98")   Wt 74.5 kg (164 lb 5.3 oz)   SpO2 100%   BMI 22.93 kg/m   Temp (24hrs), Avg:98 F (36.7 C), Min:97.9 F (36.6 C), Max:98.1 F (36.7 C)        Intake/Output Summary (Last 24 hours) at 05/02/2022 1420  Last data filed at 05/02/2022 0525  Gross per 24 hour   Intake 190 ml   Output 295 ml   Net -105 ml       Current Facility-Administered Medications   Medication Dose Route Frequency    cholestyramine light packet 4 g  4 g Oral TID    Fidaxomicin (DIFICID) tablet 200 mg  200 mg Oral BID    calcium carbonate (TUMS) chewable tablet 500 mg  500 mg Oral TID    traZODone (DESYREL) tablet 50 mg  50 mg Oral Nightly    sodium bicarbonate tablet 650 mg  650 mg Oral BID    nystatin (MYCOSTATIN) ointment   Topical BID    zinc oxide 40 % paste   Topical BID    acidophilus/citrus pectin 1 tablet  1 tablet Oral Daily    sodium chloride flush 0.9 % injection 5-40 mL  5-40 mL IntraVENous 2 times per day    sodium chloride flush 0.9 % injection 5-40 mL  5-40 mL IntraVENous PRN    0.9 % sodium chloride infusion   IntraVENous PRN    ondansetron (ZOFRAN-ODT) disintegrating tablet 4 mg  4 mg Oral Q8H PRN    Or    ondansetron (ZOFRAN) injection 4 mg  4 mg IntraVENous Q6H PRN    acetaminophen (TYLENOL) tablet 650 mg  650 mg Oral Q6H PRN    Or    acetaminophen (TYLENOL) suppository 650 mg  650 mg Rectal Q6H PRN    metronidazole  (FLAGYL) 500 mg in 0.9% NaCl 100 mL IVPB premix  500 mg IntraVENous Q8H       Physical Exam: appears stated age, cooperative, alert, more interactive and no distress  Head: Normocephalic, without obvious abnormality, atraumatic  Eyes:  anicteric  Neck: no adenopathy, no JVD, supple, symmetrical, trachea midline, and thyroid not enlarged, symmetric,  Lungs: clear to auscultation bilaterally  Heart:  regular rate and rhythm, and no S3 or S4  Abdomen: soft, non-tender; bowel sounds normal; no masses,  no organomegaly  Extremities:  no edema  Neurologic: Grossly normal -alert and oriented x3  GU: foley with dark urine       Data Review:     LABS:  Recent Labs     05/02/22  0559 05/01/22  0519 04/30/22  0604 04/29/22  0645 04/28/22  0643 04/27/22  0720 04/27/22  0719   NA 143 144 143 142 142   < >  --  K 3.2* 3.3* 3.5 3.2* 2.8*   < > 4.5   CL 114* 114* 113* 112* 111*   < >  --    CO2 22 22 22 22 22    < >  --    BUN 38* 35* 34* 30* 28*   < >  --    CREATININE 5.47* 5.17* 4.75* 4.49* 4.10*   < >  --    CALCIUM 7.3* 7.0* 7.0* 6.8* 6.6*   < >  --    LABALBU  --   --  1.9* 1.6* 1.6*   < >  --    PHOS 4.1  --  3.2 3.8 3.8   < >  --    MG 2.0  --   --  2.0  --   --  1.7    < > = values in this interval not displayed.       Recent Labs     05/01/22  0519 04/30/22  0606 04/29/22  0645   WBC 4.5 4.5 4.0*   HGB 7.4* 6.9* 7.0*   HCT 24.2* 22.1* 22.1*   PLT 76* 97* 89*       No results for input(s): NAU, KU, CLU, CREAU in the last 720 hours.    Invalid input(s): PROU    Radiology  CT ABDOMEN PELVIS WO CONTRAST Additional Contrast? None    Result Date: 04/25/2022  1. Diffuse colitis. 2. 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 3. New mild to moderate right pleural effusion with bibasilar atelectasis. New 14 mm left lower lobe nodular lung opacity. Recommend follow-up in 3 months. 4. Refer to above findings for complete details.     XR CHEST PORTABLE    Result Date: 04/25/2022  Small right pleural effusion with underlying atelectasis.       Assessment:   Renal Specific Problems  AKI- renal function worsening, has foley. Low urine output. Might have ATN. Off IVF? Still active diarrhea with poor po intake  Hypokalemia-  Hypoalbumin  Metabolic acidosis-resolved  C. difficile with significant diarrhea > 10 days--improving  Anorexia resolving, patient now hungry  UTI? No culture sent        Plan:     Obtain/ Order: labs/cultures/radiology/procedures:  renal panel in a.m.      Therapeutic:    Increase nutritional support  Oral KCL  Resume IVF LR at 125 ml/hr  IV albumin   F/u  magnesium and phosphorus  Oral NaHCO3  Keep foley for strict Is and Os  Strict Is and Os  No indication for RRT at this time  Avoid nonsteroidal anti-inflammatory agents  Trazodone for sleep  Follow-up renal function daily      Discussed with patient and Nurse

## 2022-05-02 NOTE — Progress Notes (Signed)
Hospitalist Progress Note  [facility]    Subjective:   Daily Progress Note: 05/02/2022 8:10 AM    Patient still with diarrhea  Potassium is 3.2  No abdominal pain  On DC'd  Current Facility-Administered Medications   Medication Dose Route Frequency    potassium bicarb-citric acid (EFFER-K) effervescent tablet 40 mEq  40 mEq Oral Q4H    cholestyramine light packet 4 g  4 g Oral TID    Fidaxomicin (DIFICID) tablet 200 mg  200 mg Oral BID    calcium carbonate (TUMS) chewable tablet 500 mg  500 mg Oral TID    traZODone (DESYREL) tablet 50 mg  50 mg Oral Nightly    sodium bicarbonate tablet 650 mg  650 mg Oral BID    nystatin (MYCOSTATIN) ointment   Topical BID    zinc oxide 40 % paste   Topical BID    acidophilus/citrus pectin 1 tablet  1 tablet Oral Daily    sodium chloride flush 0.9 % injection 5-40 mL  5-40 mL IntraVENous 2 times per day    sodium chloride flush 0.9 % injection 5-40 mL  5-40 mL IntraVENous PRN    0.9 % sodium chloride infusion   IntraVENous PRN    ondansetron (ZOFRAN-ODT) disintegrating tablet 4 mg  4 mg Oral Q8H PRN    Or    ondansetron (ZOFRAN) injection 4 mg  4 mg IntraVENous Q6H PRN    acetaminophen (TYLENOL) tablet 650 mg  650 mg Oral Q6H PRN    Or    acetaminophen (TYLENOL) suppository 650 mg  650 mg Rectal Q6H PRN    metronidazole (FLAGYL) 500 mg in 0.9% NaCl 100 mL IVPB premix  500 mg IntraVENous Q8H        Review of Systems  Constitutional: negative for fevers, chills, sweats, and fatigue  Eyes: negative for redness and icterus  Ears, nose, mouth, throat, and face: negative for ear drainage, nasal congestion, sore throat, and voice change  Respiratory: negative for cough, wheezing, or dyspnea on exertion  Cardiovascular: negative for chest pain, dyspnea, palpitations, lower extremity edema  Gastrointestinal: positive for diarrhea,   Genitourinary:negative for frequency, dysuria, and hematuria  Integument/breast: negative for skin lesion(s) and  dryness  Hematologic/lymphatic: negative for easy bruising, bleeding, and lymphadenopathy  Musculoskeletal:negative for neck pain, back pain, and muscle weakness  Neurological: negative for headaches, dizziness, and weakness  Behavioral/Psych: negative for anxiety and depression  Allergic/Immunologic: negative for urticaria and angioedema        Objective:     BP 117/62   Pulse 91   Temp 97.9 F (36.6 C) (Oral)   Resp 17   Ht 1.803 m (5' 10.98")   Wt 74.5 kg (164 lb 5.3 oz)   SpO2 100%   BMI 22.93 kg/m         Temp (24hrs), Avg:98 F (36.7 C), Min:97.9 F (36.6 C), Max:98.1 F (36.7 C)      No intake/output data recorded.  09/01 1901 - 09/03 0700  In: 290 [P.O.:280; I.V.:10]  Out: 995 [Urine:495]    PHYSICAL EXAM:  Skin: Extremities and face reveal no rashes.   HEENT: Sclerae anicteric. Extra-occular muscles are intact. No oral ulcers. No ENT discharge. The neck is supple.   Cardiovascular: Regular rate and rhythm. No murmurs, gallops, or rubs. PMI nondisplaced.   Respiratory:  Clear breath sounds bilaterally with no wheezes, rales, or rhonchi.   GI: Abdomen nondistended, soft, and nontender. Normal active bowel sounds. No enlargement of  the liver or spleen. No masses palpable.   Rectal: Deferred   Musculoskeletal: No pitting edema of the lower legs. Extremities have good range of motion. No costovertebral tenderness.   Neurological:  Patient is alert and oriented. Cranial nerves II-XII grossly intact  Psychiatric: Mood appears appropriate with judgement intact.   Lymphatic: No cervical or supraclavicular adenopathy.    Additional comments:I reviewed the patient's new clinical lab test results.      Data Review    Recent Results (from the past 24 hour(s))   Basic Metabolic Panel    Collection Time: 05/02/22  5:59 AM   Result Value Ref Range    Sodium 143 136 - 145 mmol/L    Potassium 3.2 (L) 3.5 - 5.1 mmol/L    Chloride 114 (H) 97 - 108 mmol/L    CO2 22 21 - 32 mmol/L    Anion Gap 7 5 - 15 mmol/L     Glucose 126 (H) 65 - 100 mg/dL    BUN 38 (H) 6 - 20 mg/dL    Creatinine 8.75 (H) 0.70 - 1.30 mg/dL    Bun/Cre Ratio 7 (L) 12 - 20      Est, Glom Filt Rate 10 (L) >60 ml/min/1.38m2    Calcium 7.3 (L) 8.5 - 10.1 mg/dL   Magnesium    Collection Time: 05/02/22  5:59 AM   Result Value Ref Range    Magnesium 2.0 1.6 - 2.4 mg/dL   Phosphorus    Collection Time: 05/02/22  5:59 AM   Result Value Ref Range    Phosphorus 4.1 2.6 - 4.7 mg/dL         Assessment/Plan:     Principal Problem:    Acute kidney injury (AKI) with acute tubular necrosis (ATN) (HCC)  Active Problems:    Acute kidney injury (HCC)    Clostridioides difficile infection    Colitis    Hypokalemia  Resolved Problems:    * No resolved hospital problems. *        Care Plan discussed with: Patient/Family    Total time spent with patient: 45 minutes.

## 2022-05-02 NOTE — Plan of Care (Signed)
PHYSICAL THERAPY TREATMENT     Patient: Carlos Petersen (71 y.o. male)  Date: 05/02/2022  Diagnosis: Hypokalemia [E87.6]  Colitis [K52.9]  Acute kidney injury (HCC) [N17.9]  Acute kidney injury (AKI) with acute tubular necrosis (ATN) (HCC) [N17.0] Acute kidney injury (AKI) with acute tubular necrosis (ATN) (HCC)      Precautions:  Fall Risk, Isolation                Recommendations for nursing mobility: Encourage HEP in prep for ADLs/mobility; see handout for details and Frequent repositioning to prevent skin breakdown    In place during session: Peripheral IV, Foley Catheter, and EKG/telemetry   Chart, physical therapy assessment, plan of care and goals were reviewed.  ASSESSMENT  Patient continues with skilled PT services and is progressing towards goals. Pt supine upon PT/OT arrival, agreeable to session. Pt A&O x 4. Slight encouragement required for participation with PT. (See below for objective details and assist levels).     Overall pt tolerated session fair today with PT/OT. Additional time required for mobility. Increased assistance required for bed mobility this tx. Maintained EOB sitting with no difficulty. Completed 2 sit<>stands from EOB with max A of 2. Max A to maintain ~10sec each stand while COTA cleaned pt. Very limited standing tolerance. Will continue to benefit from skilled PT services, and will continue to progress as tolerated. Potential barriers for safe discharge: pt is a high fall risk, pt is not safe to be alone, and concern for pt safely navigating or managing the home environment. Current PT DC recommendation Skilled Nursing Facility once medically appropriate.    GOALS:    Problem: Physical Therapy - Adult  Goal: By Discharge: Performs mobility at highest level of function for planned discharge setting.  See evaluation for individualized goals.  Description: FUNCTIONAL STATUS PRIOR TO ADMISSION: The patient  required moderate assistance for basic and instrumental ADLs. and The  patient was functional at the wheelchair level and required moderate assistancefor transfers to the chair.    HOME SUPPORT PRIOR TO ADMISSION: pt has been at [facility] x 3 weeks but has not started to work with therapy due to illness    Physical Therapy Goals  Initiated 26-May-2022  Pt stated goal: to get better again  Pt will be I with LE HEP in 7 days.  Pt will perform bed mobility with Contact Guard Assist in 7 days.  Pt will perform transfers with Contact Guard Assist in 7 days.   Pt will demonstrate improvement in standing balance from Moderate Assist and Assist x2 to Contact Guard Assist in 7 days.      Outcome: Progressing          PLAN :  Patient continues to benefit from skilled intervention to address functional impairments. Continue treatment per established plan of care to address goals.    Recommendation for discharge: (in order for the patient to meet his/her long term goals)  Skilled Nursing Facility           SUBJECTIVE:   Patient stated "We can try but I can't do much."    OBJECTIVE DATA SUMMARY:   Critical Behavior:  Orientation  Orientation Level: Oriented X4  Cognition  Overall Cognitive Status: WFL    Functional Mobility Training:  Bed Mobility:  Bed Mobility Training  Rolling: Minimum assistance;Assist X1;Additional time  Supine to Sit: Additional time;Moderate assistance;Assist X2  Sit to Supine: Additional time;Minimum assistance;Assist X1 (assist with bil LE)  Scooting: Minimum assistance;Moderate assistance;Assist X1;Additional  time  Transfers:  Teacher, early years/pre  Sit to Stand: Maximum assistance;Additional time;Assist X2  Stand to Sit: Maximum assistance;Additional time;Assist X2  Balance:  Balance  Sitting: Intact  Standing: Impaired  Standing - Static: Constant support;Poor  Standing - Dynamic: Constant support;Poor  Wheelchair Mobility:     Ambulation/Gait Training:     Gait  Assistive Device: Gait belt;Walker, rolling        Pain Rating:  4/10 R side reported    Activity Tolerance:    Fair  and requires rest breaks    After treatment patient left in no apparent distress:   Bed locked and returned to lowest position, Patient left in no apparent distress in bed, Call bell within reach, and Bed/ chair alarm activated, and nsg updated     COMMUNICATION/COLLABORATION:   The patient's plan of care was discussed with: Occupational therapy assistant and registered nurse    Patient Education  Education Given To: Patient  Education Provided: Role of Therapy;Plan of Microbiologist  Education Method: Verbal  Barriers to Learning: None  Education Outcome: Verbalized understanding;Continued education needed    Cotx required for safety.     Sharilyn Sites, PTA  Minutes: [phone]

## 2022-05-02 NOTE — Plan of Care (Signed)
Problem: Discharge Planning  Goal: Discharge to home or other facility with appropriate resources  05/02/2022 1919 by Berlin Hun, RN  Outcome: Progressing  05/02/2022 1207 by Jeryl Columbia, LPN  Outcome: Progressing     Problem: Skin/Tissue Integrity  Goal: Absence of new skin breakdown  Description: 1.  Monitor for areas of redness and/or skin breakdown  2.  Assess vascular access sites hourly  3.  Every 4-6 hours minimum:  Change oxygen saturation probe site  4.  Every 4-6 hours:  If on nasal continuous positive airway pressure, respiratory therapy assess nares and determine need for appliance change or resting period.  05/02/2022 1919 by Berlin Hun, RN  Outcome: Progressing  05/02/2022 1207 by Jeryl Columbia, LPN  Outcome: Progressing     Problem: Safety - Adult  Goal: Free from fall injury  05/02/2022 1919 by Berlin Hun, RN  Outcome: Progressing  05/02/2022 1207 by Jeryl Columbia, LPN  Outcome: Progressing     Problem: Skin/Tissue Integrity - Adult  Goal: Skin integrity remains intact  05/02/2022 1919 by Berlin Hun, RN  Outcome: Progressing  05/02/2022 1207 by Jeryl Columbia, LPN  Outcome: Progressing  Flowsheets (Taken 05/02/2022 0800)  Skin Integrity Remains Intact: Monitor for areas of redness and/or skin breakdown  Goal: Incisions, wounds, or drain sites healing without S/S of infection  05/02/2022 1919 by Berlin Hun, RN  Outcome: Progressing  05/02/2022 1207 by Jeryl Columbia, LPN  Outcome: Progressing  Goal: Oral mucous membranes remain intact  05/02/2022 1919 by Berlin Hun, RN  Outcome: Progressing  05/02/2022 1207 by Jeryl Columbia, LPN  Outcome: Progressing     Problem: Musculoskeletal - Adult  Goal: Return mobility to safest level of function  05/02/2022 1919 by Berlin Hun, RN  Outcome: Progressing  05/02/2022 1207 by Jeryl Columbia, LPN  Outcome: Progressing  Goal: Maintain proper alignment of affected body part  05/02/2022 1919 by Berlin Hun, RN  Outcome:  Progressing  05/02/2022 1207 by Jeryl Columbia, LPN  Outcome: Progressing  Goal: Return ADL status to a safe level of function  05/02/2022 1919 by Berlin Hun, RN  Outcome: Progressing  05/02/2022 1207 by Jeryl Columbia, LPN  Outcome: Progressing     Problem: Metabolic/Fluid and Electrolytes - Adult  Goal: Electrolytes maintained within normal limits  05/02/2022 1919 by Berlin Hun, RN  Outcome: Progressing  05/02/2022 1207 by Jeryl Columbia, LPN  Outcome: Progressing  Goal: Hemodynamic stability and optimal renal function maintained  05/02/2022 1919 by Berlin Hun, RN  Outcome: Progressing  05/02/2022 1207 by Jeryl Columbia, LPN  Outcome: Progressing  Goal: Glucose maintained within prescribed range  05/02/2022 1919 by Berlin Hun, RN  Outcome: Progressing  05/02/2022 1207 by Jeryl Columbia, LPN  Outcome: Progressing     Problem: Gastrointestinal - Adult  Goal: Minimal or absence of nausea and vomiting  05/02/2022 1919 by Berlin Hun, RN  Outcome: Progressing  05/02/2022 1207 by Jeryl Columbia, LPN  Outcome: Progressing     Problem: Genitourinary - Adult  Goal: Absence of urinary retention  05/02/2022 1207 by Jeryl Columbia, LPN  Outcome: Progressing     Problem: Hematologic - Adult  Goal: Maintains hematologic stability  05/02/2022 1207 by Jeryl Columbia, LPN  Outcome: Progressing     Problem: Infection - Adult  Goal: Absence of infection at discharge  05/02/2022 1207 by Jeryl Columbia, LPN  Outcome: Progressing     Problem: Chronic Conditions and Co-morbidities  Goal: Patient's chronic conditions and co-morbidity symptoms are monitored and maintained or improved  05/02/2022 1207  by Jeryl Columbia, LPN  Outcome: Progressing     Problem: Nutrition Deficit:  Goal: Optimize nutritional status  05/02/2022 1207 by Jeryl Columbia, LPN  Outcome: Progressing     Problem: Physical Therapy - Adult  Goal: By Discharge: Performs mobility at highest level of function for planned discharge setting.  See evaluation for  individualized goals.  Description: FUNCTIONAL STATUS PRIOR TO ADMISSION: The patient  required moderate assistance for basic and instrumental ADLs. and The patient was functional at the wheelchair level and required moderate assistancefor transfers to the chair.    HOME SUPPORT PRIOR TO ADMISSION: pt has been at [facility] x 3 weeks but has not started to work with therapy due to illness    Physical Therapy Goals  Initiated 05/22/22  Pt stated goal: to get better again  Pt will be I with LE HEP in 7 days.  Pt will perform bed mobility with Contact Guard Assist in 7 days.  Pt will perform transfers with Contact Guard Assist in 7 days.   Pt will demonstrate improvement in standing balance from Moderate Assist and Assist x2 to Contact Guard Assist in 7 days.      05/02/2022 1315 by Sharilyn Sites, PTA  Outcome: Progressing     Problem: Occupational Therapy - Adult  Goal: By Discharge: Performs self-care activities at highest level of function for planned discharge setting.  See evaluation for individualized goals.  Description: FUNCTIONAL STATUS PRIOR TO ADMISSION:  Pt was using a power wheelchair following hip surgery in April. Pt req'd assistance with ADLs.    HOME SUPPORT: The pt was at [facility] for the past three weeks.    Occupational Therapy Goals:  Initiated 2022/05/22  Patient/Family stated goal: Transfer to wheelchair.  1.  Patient will perform lower body dressing with Minimal Assist within 7 day(s).  2.  Patient will perform grooming with Modified Independence within 7 day(s).  3.  Patient will perform bathing with Minimal Assist within 7 day(s).  4.  Patient will perform toilet transfers with Minimal Assist  within 7 day(s).  5.  Patient will perform all aspects of toileting with Minimal Assist within 7 day(s).  6.  Patient will participate in upper extremity therapeutic exercise/activities with Modified Independence within 7 day(s).    05/02/2022 1304 by Lowella Dandy,  OTA  Outcome: Progressing

## 2022-05-02 NOTE — Progress Notes (Signed)
OCCUPATIONAL THERAPY TREATMENT  Patient: Carlos Petersen (71 y.o. male)  Date: 05/02/2022  Primary Diagnosis: Hypokalemia [E87.6]  Colitis [K52.9]  Acute kidney injury (HCC) [N17.9]  Acute kidney injury (AKI) with acute tubular necrosis (ATN) (HCC) [N17.0]       Precautions: Fall Risk, Isolation                Recommendations for nursing mobility: Encourage HEP in prep for ADLs/mobility; see handout for details, Frequent repositioning to prevent skin breakdown, and Assist x2    In place during session: Peripheral IV, Foley Catheter, and EKG/telemetry   Chart, occupational therapy assessment, plan of care, and goals were reviewed.  ASSESSMENT  Patient continues with skilled OT services and is progressing towards goals.  Pt received semi-supine in bed upon arrival, AXO x 4 and agreeable to COTA/PTA tx at this time. Pt cooperative and demonstrated fair effort during activities. Pt tolerated therapy session fairly with much encouragement. Pt req'd 1-2 assistance with bed mobility<>EOB with vc's for proper technique. Pt presented good sitting balance while performing simple grooming with vc's for sitting posture. Pt req'd assist x2 for two STS with vc's for hand/foot placement/correct posture, pt soiled self and with second stand, pt req'd TA however unable to complete task as pt fatigued. Pt returned and toileting hygiene was completed with pt rolling L/R with min A. Pt set-up for eating lunch with assist with opening containers.  Overall, pt continues to present with deficits in decreased functional mobility, independence in ADLs, high-level IADLs, strength, body mechanics, activity tolerance, endurance,  standing balance and standing posture (see below for objective details and assist levels).      Will continue to progress. Potential barriers for safe discharge pt is a high fall risk and pt is not safe to be alone. Current OT recommendations for discharge Skilled Nursing Facility.           Start of session End of  session   SPO2 (%) 98 98   Heart Rate (BPM) 96 91         GOALS:    Problem: Occupational Therapy - Adult  Goal: By Discharge: Performs self-care activities at highest level of function for planned discharge setting.  See evaluation for individualized goals.  Description: FUNCTIONAL STATUS PRIOR TO ADMISSION:  Pt was using a power wheelchair following hip surgery in April. Pt req'd assistance with ADLs.    HOME SUPPORT: The pt was at [facility] for the past three weeks.    Occupational Therapy Goals:  Initiated 2022/05/10  Patient/Family stated goal: Transfer to wheelchair.  1.  Patient will perform lower body dressing with Minimal Assist within 7 day(s).  2.  Patient will perform grooming with Modified Independence within 7 day(s).  3.  Patient will perform bathing with Minimal Assist within 7 day(s).  4.  Patient will perform toilet transfers with Minimal Assist  within 7 day(s).  5.  Patient will perform all aspects of toileting with Minimal Assist within 7 day(s).  6.  Patient will participate in upper extremity therapeutic exercise/activities with Modified Independence within 7 day(s).    Outcome: Progressing          PLAN :  Patient continues to benefit from skilled intervention to address functional impairments.  Continue treatment per established plan of care to address goals.    Recommend next OT session: UB dressing, LB dressing, and Seated grooming    Recommendation for discharge: (in order for the patient to meet his/her  long term goals): Skilled Nursing Facility    IF patient discharges home will need the following DME: continuing to assess with progress       SUBJECTIVE:   Patient stated "I'm not going to be able to do much."    OBJECTIVE DATA SUMMARY:   Cognitive/Behavioral Status:  Orientation  Orientation Level: Oriented X4  Cognition  Overall Cognitive Status: WFL    Functional Mobility and Transfers for ADLs:  Bed Mobility:  Bed Mobility Training  Rolling: Minimum assistance;Assist  X1;Additional time  Supine to Sit: Additional time;Moderate assistance;Assist X2  Sit to Supine: Additional time;Minimum assistance;Assist X1 (assist with bil LE)  Scooting: Minimum assistance;Moderate assistance;Assist X1;Additional time    Transfers:  Transfer Training  Sit to Stand: Maximum assistance;Additional time;Assist X2  Stand to Sit: Maximum assistance;Additional time;Assist X2      Balance:  Balance  Sitting: Intact  Standing: Impaired  Standing - Static: Constant support;Poor  Standing - Dynamic: Constant support;Poor      ADL Intervention:         Feeding: Setup   Feeding Skilled Clinical Factors: assist with containers for eating lunch     Grooming: Setup   Grooming Skilled Clinical Factors: EOB, faical care          Toileting: Dependent/Total  Toileting Skilled Clinical Factors: standing and supine for bowel movement and barrier cream        Pain Rating:  Reported bottom hurt but no pain level number  Pain Intervention(s):   repositioning    Activity Tolerance:   Fair  and requires frequent rest breaks    After treatment patient left in no apparent distress:   Bed locked and returned to lowest position, Patient left in no apparent distress in bed, Call bell within reach, Bed/ chair alarm activated, and Side rails x3, and nsg updated     COMMUNICATION/EDUCATION:   The patient's plan of care was discussed with: Physical therapy assistant and Registered nurse Co-tx with PTA for increased pt/clinician safety with OOB activity         Patient Education  Education Given To: Patient  Education Provided: Role of Therapy;Transfer Training;Energy Conservation;ADL Adaptive Strategies;Fall Prevention Strategies  Education Method: Demonstration;Verbal  Education Outcome: Verbalized understanding;Continued education needed;Demonstrated understanding    Thank you for this referral.  Lowella Dandy, OTA  Minutes: 31

## 2022-05-03 LAB — RENAL FUNCTION PANEL
Albumin: 1.6 g/dL — ABNORMAL LOW (ref 3.5–5.0)
Anion Gap: 7 mmol/L (ref 5–15)
BUN: 42 mg/dL — ABNORMAL HIGH (ref 6–20)
Bun/Cre Ratio: 8 — ABNORMAL LOW (ref 12–20)
CO2: 21 mmol/L (ref 21–32)
Calcium: 7.2 mg/dL — ABNORMAL LOW (ref 8.5–10.1)
Chloride: 115 mmol/L — ABNORMAL HIGH (ref 97–108)
Creatinine: 5.56 mg/dL — ABNORMAL HIGH (ref 0.70–1.30)
Est, Glom Filt Rate: 10 mL/min/{1.73_m2} — ABNORMAL LOW (ref >60)
Glucose: 103 mg/dL — ABNORMAL HIGH (ref 65–100)
Phosphorus: 3.7 mg/dL (ref 2.6–4.7)
Potassium: 3.4 mmol/L — ABNORMAL LOW (ref 3.5–5.1)
Sodium: 143 mmol/L (ref 136–145)

## 2022-05-03 LAB — SODIUM, URINE, RANDOM: SODIUM, RANDOM URINE: 15 mmol/L

## 2022-05-03 LAB — CREATININE, RANDOM URINE: Creatinine, Ur: 150 mg/dL

## 2022-05-03 LAB — POTASSIUM, URINE, RANDOM: POTASSIUM, RANDOM URINE: 22 mmol/L

## 2022-05-03 LAB — PROTEIN / CREATININE RATIO, URINE
Creatinine, Ur: 153 mg/dL
PROTEIN/CREAT RATIO URINE RAN: 1.4
Protein, Urine, Random: 215 mg/dL — ABNORMAL HIGH (ref 0.0–11.9)

## 2022-05-03 LAB — MAGNESIUM: Magnesium: 1.9 mg/dL (ref 1.6–2.4)

## 2022-05-03 LAB — PROTEIN, URINE, RANDOM: Protein, Urine, Random: 227 mg/dL — ABNORMAL HIGH (ref 0.0–11.9)

## 2022-05-03 MED ORDER — KCL IN DEXTROSE-NACL 20-5-0.45 MEQ/L-%-% IV SOLN
INTRAVENOUS | Status: AC
Start: 2022-05-03 — End: 2022-05-04
  Administered 2022-05-03 – 2022-05-04 (×2): via INTRAVENOUS

## 2022-05-03 MED ORDER — FLUCONAZOLE 100 MG PO TABS
100 MG | Freq: Every day | ORAL | Status: AC
Start: 2022-05-03 — End: 2022-05-05
  Administered 2022-05-03 – 2022-05-05 (×3): 200 mg via ORAL

## 2022-05-03 MED ORDER — ALBUMIN HUMAN 25 % IV SOLN
25 % | Freq: Three times a day (TID) | INTRAVENOUS | Status: AC
Start: 2022-05-03 — End: 2022-05-04
  Administered 2022-05-03 – 2022-05-04 (×3): 25 g via INTRAVENOUS

## 2022-05-03 MED ORDER — POTASSIUM CHLORIDE CRYS ER 20 MEQ PO TBCR
20 MEQ | Freq: Two times a day (BID) | ORAL | Status: DC
Start: 2022-05-03 — End: 2022-05-04
  Administered 2022-05-03 – 2022-05-04 (×3): 10 meq via ORAL

## 2022-05-03 MED FILL — DIFICID 200 MG PO TABS: 200 MG | ORAL | Qty: 1

## 2022-05-03 MED FILL — ALBUTEIN 25 % IV SOLN: 25 % | INTRAVENOUS | Qty: 100

## 2022-05-03 NOTE — Plan of Care (Signed)
Problem: Discharge Planning  Goal: Discharge to home or other facility with appropriate resources  Outcome: Progressing     Problem: Skin/Tissue Integrity  Goal: Absence of new skin breakdown  Description: 1.  Monitor for areas of redness and/or skin breakdown  2.  Assess vascular access sites hourly  3.  Every 4-6 hours minimum:  Change oxygen saturation probe site  4.  Every 4-6 hours:  If on nasal continuous positive airway pressure, respiratory therapy assess nares and determine need for appliance change or resting period.  Outcome: Progressing     Problem: Safety - Adult  Goal: Free from fall injury  Outcome: Progressing     Problem: Skin/Tissue Integrity - Adult  Goal: Skin integrity remains intact  Outcome: Progressing  Flowsheets (Taken 05/03/2022 0830)  Skin Integrity Remains Intact: Monitor for areas of redness and/or skin breakdown  Goal: Incisions, wounds, or drain sites healing without S/S of infection  Outcome: Progressing  Goal: Oral mucous membranes remain intact  Outcome: Progressing     Problem: Musculoskeletal - Adult  Goal: Return mobility to safest level of function  Outcome: Progressing  Goal: Maintain proper alignment of affected body part  Outcome: Progressing  Goal: Return ADL status to a safe level of function  Outcome: Progressing     Problem: Metabolic/Fluid and Electrolytes - Adult  Goal: Electrolytes maintained within normal limits  Outcome: Progressing  Goal: Hemodynamic stability and optimal renal function maintained  Outcome: Progressing  Goal: Glucose maintained within prescribed range  Outcome: Progressing     Problem: Chronic Conditions and Co-morbidities  Goal: Patient's chronic conditions and co-morbidity symptoms are monitored and maintained or improved  Outcome: Progressing     Problem: Nutrition Deficit:  Goal: Optimize nutritional status  Outcome: Progressing     Problem: Gastrointestinal - Adult  Goal: Minimal or absence of nausea and vomiting  Outcome: Progressing      Problem: Genitourinary - Adult  Goal: Absence of urinary retention  Outcome: Progressing     Problem: Hematologic - Adult  Goal: Maintains hematologic stability  Outcome: Progressing     Problem: Infection - Adult  Goal: Absence of infection at discharge  Outcome: Progressing

## 2022-05-03 NOTE — Progress Notes (Signed)
OT attempted to see pt at 2:03 pm however pt declining d/t being too tired and wanting to sleep. Educated pt on the importance of working with therapy to improve overall health. Will try again at a later date.

## 2022-05-03 NOTE — Progress Notes (Signed)
Spiritual Care Assessment/Progress Note  [facility]    Name: CAEDYN RAYGOZA MRN: [id number]    Age: 71 y.o.     Sex: male   Language: English     Date: 05/03/2022            Total Time Calculated: 7 min              Spiritual Assessment begun in [facility]  Service Provided For:: Patient not available  Referral/Consult From:: Rounding  Encounter Overview/Reason : Attempted Encounter    Spiritual beliefs:      []  Involved in a faith tradition/spiritual practice:      []  Supported by a faith community:      []  Claims no spiritual orientation:      []  Seeking spiritual identity:           []  Adheres to an individual form of spirituality:      [x]  Not able to assess:                Identified resources for coping and support system:   Support System: Unknown       []  Prayer                  []  Devotional reading               []  Music                  []  Guided Imagery     []  Pet visits                                        []  Other: (COMMENT)     Specific area/focus of visit   Encounter:    Crisis:    Spiritual/Emotional needs:    Ritual, Rites and Sacraments:    Grief, Loss, and Adjustments:    Ethics/Mediation:    Behavioral Health:    Palliative Care:    Advance Care Planning:      Plan/Referrals: Other (Comment) (Chaplain is available if needed)    Narrative: Chaplain Alli and Chaplain April conferred with pt's chart and RN before visit. RN shared that pt is not up for a spiritual care visit at this time.     Please contact Spiritual Care for any future needs/referrals.     , Chaplain Resident, M.Div

## 2022-05-03 NOTE — Progress Notes (Signed)
Infectious Disease Progress Note               Subjective:   Pt seen and examined at bedside. Reports generalized weakness, ongoing reported of loose stools, denies abdominal pain.   Objective:   Physical Exam:     BP 108/64   Pulse 84   Temp 98.2 F (36.8 C) (Oral)   Resp 17   Ht 5' 10.98" (1.803 m)   Wt 164 lb 5.3 oz (74.5 kg)   SpO2 97%   BMI 22.93 kg/m    O2 Device: None (Room air)    Temp (24hrs), Avg:97.7 F (36.5 C), Min:97.3 F (36.3 C), Max:98.2 F (36.8 C)    No intake/output data recorded.   09/02 1901 - 09/04 0700  In: 450 [P.O.:430; I.V.:20]  Out: 525 [Urine:525]    General: NAD, AAO x 4  HEENT: PERLA, Moist mucosa   Lungs: CTA b/l, decreased at the bases, no wheeze/rhonchi   Heart: S1S2+, RRR, no murmur  Abdo: Soft, NT, ND, +BS   GU: indwelling foley cath   Exts: +2 pitting edema, + pulses b/l   Skin: Generalized rash, erythematous    Data Review:       Recent Days:    Recent Labs     05/01/22  0519   WBC 4.5   HGB 7.4*   HCT 24.2*   PLT 76*       Recent Labs     05/01/22  0519 05/02/22  0559 05/03/22  0553   BUN 35* 38* 42*   CREATININE 5.17* 5.47* 5.56*         No results found for: CRP       Microbiology     Results       Procedure Component Value Units Date/Time    Culture, Urine [[id number]] Collected: 05/01/22 1545    Order Status: Sent Specimen: Urine Updated: 05/01/22 1604    Clostridium Difficile Toxin/Antigen [[id number]]  (Abnormal) Collected: 2022-05-24 1215    Order Status: Completed Specimen: Stool Updated: 2022-05-24 1511     GDH Antigen Positive        C difficile Toxin, EIA Positive        Comment: Results verified, phoned to and read back by Joellyn Rued RN AT 1510 ON [id number] [facility]        C Diff Toxin Interpretation       Positive for toxigenic C. difficile                   Diagnostic   CT ABDOMEN PELVIS WO CONTRAST Additional Contrast? None    Result Date: 04/25/2022  1. Diffuse colitis. 2. 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 3. New mild to moderate  right pleural effusion with bibasilar atelectasis. New 14 mm left lower lobe nodular lung opacity. Recommend follow-up in 3 months. 4. Refer to above findings for complete details.     XR CHEST PORTABLE    Result Date: 04/25/2022  Small right pleural effusion with underlying atelectasis.     [place] RETROPERITONEAL COMPLETE    Result Date: 04/27/2022  1.  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 2.  Layering debris in the bladder, correlate with urinalysis. 3.  Splenomegaly. 4.  Small volume ascites and right pleural effusion.      XR ABDOMEN (2 VIEWS)    Result Date: 04/29/2022  1.  No evidence of ileus or obstruction. 2.  Small right and trace left pleural effusions.       Assessment/Plan  Severe C.diff infection w  Diffuse colitis on CT abdo/pel (08/27)         Stools remain loose though not as frequent        Afebrile w a normal WBC on routine labs         On day # 5/10 of Fidoxamicin, and day #9/14 of IV metronidazole         Continue questran and probiotics, routine labs in AM      2. Mucocutaneous candidiasis involving perineum      Already on topical nystatin, 3 day course of Fluconazole ordered       Difficult to keep area dry w ongoing loose stools       Pt at high risk for skin break down and pressure ulcers     3. AKI Cr remains elevated, foley in place      4. Generalized erythematous rash, reason unclear, ? Vasculitis      ANA added to morning labs     Jene Every, MD    05/03/2022

## 2022-05-03 NOTE — Progress Notes (Signed)
[facility]     Renal Daily Progress Note:     Subjective:  Not improved over weekend. Poor appetite with nausea or vomiting. Mild abdominal discomfort.  Hungry.  Diarrhea still present but may be improved a bit; had rectal tube removed.  No pain, no shortness of breath, no nausea or vomiting. Serum creatine up a bit. Major complaint is inability to sleep    9/3-still has diarrhea, lo urine output. Has foley, urine dark. Off IVF? Renal function worsening    Review of Systems  Pertinent items are noted in HPI.    Objective:     BP 112/69   Pulse 81   Temp 97.6 F (36.4 C) (Oral)   Resp 16   Ht 1.803 m (5' 10.98")   Wt 74.5 kg (164 lb 5.3 oz)   SpO2 96%   BMI 22.93 kg/m   Temp (24hrs), Avg:97.7 F (36.5 C), Min:97.3 F (36.3 C), Max:98.2 F (36.8 C)        Intake/Output Summary (Last 24 hours) at 05/03/2022 1310  Last data filed at 05/03/2022 0546  Gross per 24 hour   Intake 160 ml   Output 275 ml   Net -115 ml     Current Facility-Administered Medications   Medication Dose Route Frequency    fluconazole (DIFLUCAN) tablet 200 mg  200 mg Oral Daily    lactated ringers IV soln infusion   IntraVENous Continuous    cholestyramine light packet 4 g  4 g Oral TID    Fidaxomicin (DIFICID) tablet 200 mg  200 mg Oral BID    calcium carbonate (TUMS) chewable tablet 500 mg  500 mg Oral TID    traZODone (DESYREL) tablet 50 mg  50 mg Oral Nightly    sodium bicarbonate tablet 650 mg  650 mg Oral BID    nystatin (MYCOSTATIN) ointment   Topical BID    zinc oxide 40 % paste   Topical BID    acidophilus/citrus pectin 1 tablet  1 tablet Oral Daily    sodium chloride flush 0.9 % injection 5-40 mL  5-40 mL IntraVENous 2 times per day    sodium chloride flush 0.9 % injection 5-40 mL  5-40 mL IntraVENous PRN    0.9 % sodium chloride infusion   IntraVENous PRN    ondansetron (ZOFRAN-ODT) disintegrating tablet 4 mg  4 mg Oral Q8H PRN    Or    ondansetron (ZOFRAN) injection 4 mg  4 mg IntraVENous Q6H PRN    acetaminophen  (TYLENOL) tablet 650 mg  650 mg Oral Q6H PRN    Or    acetaminophen (TYLENOL) suppository 650 mg  650 mg Rectal Q6H PRN    metronidazole (FLAGYL) 500 mg in 0.9% NaCl 100 mL IVPB premix  500 mg IntraVENous Q8H       Physical Exam: appears stated age, cooperative, alert, more interactive and no distress  Head: Normocephalic, without obvious abnormality, atraumatic  Eyes:  anicteric  Neck: no adenopathy, no JVD, supple, symmetrical, trachea midline, and thyroid not enlarged, symmetric,  Lungs: clear to auscultation bilaterally  Heart:  regular rate and rhythm, and no S3 or S4  Abdomen: soft, non-tender; bowel sounds normal; no masses,  no organomegaly- no guarding  Extremities:  no edema  Neurologic: Grossly normal -alert and oriented x3  GU: foley with dark urine       Data Review:     LABS:  Recent Labs     05/03/22  0553 05/02/22  0559 05/01/22  2706 04/30/22  0604 04/29/22  0645   NA 143 143 144 143 142   K 3.4* 3.2* 3.3* 3.5 3.2*   CL 115* 114* 114* 113* 112*   CO2 21 22 22 22 22    BUN 42* 38* 35* 34* 30*   CREATININE 5.56* 5.47* 5.17* 4.75* 4.49*   CALCIUM 7.2* 7.3* 7.0* 7.0* 6.8*   LABALBU 1.6*  --   --  1.9* 1.6*   PHOS 3.7 4.1  --  3.2 3.8   MG 1.9 2.0  --   --  2.0     Recent Labs     05/01/22  0519 04/30/22  0606 04/29/22  0645   WBC 4.5 4.5 4.0*   HGB 7.4* 6.9* 7.0*   HCT 24.2* 22.1* 22.1*   PLT 76* 97* 89*     No results for input(s): NAU, KU, CLU, CREAU in the last 720 hours.    Invalid input(s): PROU    Radiology  CT ABDOMEN PELVIS WO CONTRAST Additional Contrast? None    Result Date: 04/25/2022  1. Diffuse colitis. 2. 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 3. New mild to moderate right pleural effusion with bibasilar atelectasis. New 14 mm left lower lobe nodular lung opacity. Recommend follow-up in 3 months. 4. Refer to above findings for complete details.     XR CHEST PORTABLE    Result Date: 04/25/2022  Small right pleural effusion with underlying atelectasis.      Assessment:   Renal Specific  Problems  AKI- renal function worsening, has foley. Low urine output. Might have ATN, initial urine studies suggested volume depletion. -Still active diarrhea with poor po intake  Hypokalemia-  Hypoalbumin  Metabolic acidosis-resolved  C. difficile with significant diarrhea > 10 days--improving  Anorexia resolving, patient now hungry  UTI? No culture sent        Plan:     Obtain/ Order: labs/cultures/radiology/procedures:  renal panel in a.m.  Repeat urine Na, Creat, K and protein    Therapeutic:    Increase nutritional support  Oral KCL  Change IVF to 0.45NS  with 20 KCL/l at 125 ml/hr  IV albumin -reorder  F/u  magnesium and phosphorus  Oral NaHCO3  Keep foley for strict Is and Os  Strict Is and Os  No indication for RRT at this time  Avoid nonsteroidal anti-inflammatory agents  Trazodone for sleep  Follow-up renal function daily      Discussed with patient and Nurse

## 2022-05-03 NOTE — Progress Notes (Signed)
Hospitalist Progress Note  [facility]    Subjective:   Daily Progress Note: 05/03/2022 11:07 AM    Patient still has ongoing diarrhea     lethargic    Current Facility-Administered Medications   Medication Dose Route Frequency    fluconazole (DIFLUCAN) tablet 200 mg  200 mg Oral Daily    lactated ringers IV soln infusion   IntraVENous Continuous    cholestyramine light packet 4 g  4 g Oral TID    Fidaxomicin (DIFICID) tablet 200 mg  200 mg Oral BID    calcium carbonate (TUMS) chewable tablet 500 mg  500 mg Oral TID    traZODone (DESYREL) tablet 50 mg  50 mg Oral Nightly    sodium bicarbonate tablet 650 mg  650 mg Oral BID    nystatin (MYCOSTATIN) ointment   Topical BID    zinc oxide 40 % paste   Topical BID    acidophilus/citrus pectin 1 tablet  1 tablet Oral Daily    sodium chloride flush 0.9 % injection 5-40 mL  5-40 mL IntraVENous 2 times per day    sodium chloride flush 0.9 % injection 5-40 mL  5-40 mL IntraVENous PRN    0.9 % sodium chloride infusion   IntraVENous PRN    ondansetron (ZOFRAN-ODT) disintegrating tablet 4 mg  4 mg Oral Q8H PRN    Or    ondansetron (ZOFRAN) injection 4 mg  4 mg IntraVENous Q6H PRN    acetaminophen (TYLENOL) tablet 650 mg  650 mg Oral Q6H PRN    Or    acetaminophen (TYLENOL) suppository 650 mg  650 mg Rectal Q6H PRN    metronidazole (FLAGYL) 500 mg in 0.9% NaCl 100 mL IVPB premix  500 mg IntraVENous Q8H        Review of Systems  Constitutional: negative for fevers, chills, sweats, and fatigue  Eyes: negative for redness and icterus  Ears, nose, mouth, throat, and face: negative for ear drainage, nasal congestion, sore throat, and voice change  Respiratory: negative for cough, wheezing, or dyspnea on exertion  Cardiovascular: negative for chest pain, dyspnea, palpitations, lower extremity edema  Gastrointestinal: positive  diarrhea,   Genitourinary:negative for frequency, dysuria, and hematuria  Integument/breast: negative for skin lesion(s) and  dryness  Hematologic/lymphatic: negative for easy bruising, bleeding, and lymphadenopathy  Musculoskeletal:negative for neck pain, back pain, and muscle weakness  Neurological: negative for headaches, dizziness, and weakness  Behavioral/Psych: negative for anxiety and depression  Allergic/Immunologic: negative for urticaria and angioedema        Objective:     BP 108/64   Pulse 84   Temp 98.2 F (36.8 C) (Oral)   Resp 17   Ht 1.803 m (5' 10.98")   Wt 74.5 kg (164 lb 5.3 oz)   SpO2 97%   BMI 22.93 kg/m         Temp (24hrs), Avg:97.7 F (36.5 C), Min:97.3 F (36.3 C), Max:98.2 F (36.8 C)      No intake/output data recorded.  09/02 1901 - 09/04 0700  In: 450 [P.O.:430; I.V.:20]  Out: 525 [Urine:525]    PHYSICAL EXAM:  Skin: Extremities and face reveal no rashes.   HEENT: Sclerae anicteric. Extra-occular muscles are intact. No oral ulcers.  Mucosa is dry no ENT discharge. The neck is supple.   Cardiovascular: Regular rate and rhythm. No murmurs, gallops, or rubs. PMI nondisplaced.   Respiratory:  Clear breath sounds bilaterally with no wheezes, rales, or rhonchi.   GI: Abdomen nondistended,  soft, and nontender. Normal active bowel sounds. No enlargement of the liver or spleen. No masses palpable.   Rectal: Deferred   Musculoskeletal: No pitting edema of the lower legs. Extremities have good range of motion. No costovertebral tenderness.   Neurological:  Patient is alert and oriented. Cranial nerves II-XII grossly intact  Psychiatric: Mood appears appropriate with judgement intact.   Lymphatic: No cervical or supraclavicular adenopathy.    Additional comments:I reviewed the patient's new clinical lab test results.      Data Review    Recent Results (from the past 24 hour(s))   Renal Function Panel    Collection Time: 05/03/22  5:53 AM   Result Value Ref Range    Sodium 143 136 - 145 mmol/L    Potassium 3.4 (L) 3.5 - 5.1 mmol/L    Chloride 115 (H) 97 - 108 mmol/L    CO2 21 21 - 32 mmol/L    Anion Gap 7 5 - 15  mmol/L    Glucose 103 (H) 65 - 100 mg/dL    BUN 42 (H) 6 - 20 mg/dL    Creatinine 3.15 (H) 0.70 - 1.30 mg/dL    Bun/Cre Ratio 8 (L) 12 - 20      Est, Glom Filt Rate 10 (L) >60 ml/min/1.89m2    Calcium 7.2 (L) 8.5 - 10.1 mg/dL    Phosphorus 3.7 2.6 - 4.7 mg/dL    Albumin 1.6 (L) 3.5 - 5.0 g/dL   Magnesium    Collection Time: 05/03/22  5:53 AM   Result Value Ref Range    Magnesium 1.9 1.6 - 2.4 mg/dL         Assessment/Plan:     Principal Problem:    Acute kidney injury (AKI) with acute tubular necrosis (ATN) (HCC)  Active Problems:    Acute kidney injury (HCC)    Clostridioides difficile infection    Colitis    Hypokalemia  Resolved Problems:    * No resolved hospital problems. *    Volume depletion  Acute kidney injury on chronic kidney failure stage III possible nephrology following continue with IV fluids at 125/h lactated Ringer's continue to monitor electrolytes    Care Plan discussed with: Patient/Family    Total time spent with patient: 30 minutes.

## 2022-05-03 NOTE — Plan of Care (Signed)
Problem: Discharge Planning  Goal: Discharge to home or other facility with appropriate resources  05/03/2022 2124 by Berlin Hun, RN  Outcome: Progressing  05/03/2022 1305 by Jeryl Columbia, LPN  Outcome: Progressing     Problem: Skin/Tissue Integrity  Goal: Absence of new skin breakdown  Description: 1.  Monitor for areas of redness and/or skin breakdown  2.  Assess vascular access sites hourly  3.  Every 4-6 hours minimum:  Change oxygen saturation probe site  4.  Every 4-6 hours:  If on nasal continuous positive airway pressure, respiratory therapy assess nares and determine need for appliance change or resting period.  05/03/2022 2124 by Berlin Hun, RN  Outcome: Progressing  05/03/2022 1305 by Jeryl Columbia, LPN  Outcome: Progressing     Problem: Safety - Adult  Goal: Free from fall injury  05/03/2022 2124 by Berlin Hun, RN  Outcome: Progressing  05/03/2022 1305 by Jeryl Columbia, LPN  Outcome: Progressing     Problem: Skin/Tissue Integrity - Adult  Goal: Skin integrity remains intact  05/03/2022 2124 by Berlin Hun, RN  Outcome: Progressing  05/03/2022 1305 by Jeryl Columbia, LPN  Outcome: Progressing  Flowsheets (Taken 05/03/2022 0830)  Skin Integrity Remains Intact: Monitor for areas of redness and/or skin breakdown  Goal: Incisions, wounds, or drain sites healing without S/S of infection  05/03/2022 2124 by Berlin Hun, RN  Outcome: Progressing  05/03/2022 1305 by Jeryl Columbia, LPN  Outcome: Progressing  Goal: Oral mucous membranes remain intact  05/03/2022 2124 by Berlin Hun, RN  Outcome: Progressing  05/03/2022 1305 by Jeryl Columbia, LPN  Outcome: Progressing     Problem: Musculoskeletal - Adult  Goal: Return mobility to safest level of function  05/03/2022 2124 by Berlin Hun, RN  Outcome: Progressing  05/03/2022 1305 by Jeryl Columbia, LPN  Outcome: Progressing  Goal: Maintain proper alignment of affected body part  05/03/2022 2124 by Berlin Hun, RN  Outcome:  Progressing  05/03/2022 1305 by Jeryl Columbia, LPN  Outcome: Progressing  Goal: Return ADL status to a safe level of function  05/03/2022 2124 by Berlin Hun, RN  Outcome: Progressing  05/03/2022 1305 by Jeryl Columbia, LPN  Outcome: Progressing     Problem: Metabolic/Fluid and Electrolytes - Adult  Goal: Electrolytes maintained within normal limits  05/03/2022 1305 by Jeryl Columbia, LPN  Outcome: Progressing  Goal: Hemodynamic stability and optimal renal function maintained  05/03/2022 1305 by Jeryl Columbia, LPN  Outcome: Progressing  Goal: Glucose maintained within prescribed range  05/03/2022 1305 by Jeryl Columbia, LPN  Outcome: Progressing     Problem: Gastrointestinal - Adult  Goal: Minimal or absence of nausea and vomiting  05/03/2022 1305 by Jeryl Columbia, LPN  Outcome: Progressing     Problem: Genitourinary - Adult  Goal: Absence of urinary retention  05/03/2022 1305 by Jeryl Columbia, LPN  Outcome: Progressing     Problem: Hematologic - Adult  Goal: Maintains hematologic stability  05/03/2022 1305 by Jeryl Columbia, LPN  Outcome: Progressing     Problem: Infection - Adult  Goal: Absence of infection at discharge  05/03/2022 1305 by Jeryl Columbia, LPN  Outcome: Progressing     Problem: Chronic Conditions and Co-morbidities  Goal: Patient's chronic conditions and co-morbidity symptoms are monitored and maintained or improved  05/03/2022 1305 by Jeryl Columbia, LPN  Outcome: Progressing     Problem: Nutrition Deficit:  Goal: Optimize nutritional status  05/03/2022 1305 by Jeryl Columbia, LPN  Outcome: Progressing

## 2022-05-04 ENCOUNTER — Inpatient Hospital Stay: Admit: 2022-05-05 | Payer: MEDICARE | Primary: Internal Medicine

## 2022-05-04 LAB — RENAL FUNCTION PANEL
Albumin: 2.2 g/dL — ABNORMAL LOW (ref 3.5–5.0)
Anion Gap: 7 mmol/L (ref 5–15)
BUN: 43 mg/dL — ABNORMAL HIGH (ref 6–20)
Bun/Cre Ratio: 8 — ABNORMAL LOW (ref 12–20)
CO2: 20 mmol/L — ABNORMAL LOW (ref 21–32)
Calcium: 7.5 mg/dL — ABNORMAL LOW (ref 8.5–10.1)
Chloride: 115 mmol/L — ABNORMAL HIGH (ref 97–108)
Creatinine: 5.62 mg/dL — ABNORMAL HIGH (ref 0.70–1.30)
Est, Glom Filt Rate: 10 mL/min/{1.73_m2} — ABNORMAL LOW (ref >60)
Glucose: 111 mg/dL — ABNORMAL HIGH (ref 65–100)
Phosphorus: 4 mg/dL (ref 2.6–4.7)
Potassium: 3.1 mmol/L — ABNORMAL LOW (ref 3.5–5.1)
Sodium: 142 mmol/L (ref 136–145)

## 2022-05-04 LAB — CBC WITH AUTO DIFFERENTIAL
Absolute Immature Granulocyte: 0 10*3/uL (ref 0.00–0.04)
Basophils %: 0 % (ref 0–1)
Basophils Absolute: 0 10*3/uL (ref 0.0–0.1)
Eosinophils %: 3 % (ref 0–7)
Eosinophils Absolute: 0.1 10*3/uL (ref 0.0–0.4)
Hematocrit: 22.1 % — ABNORMAL LOW (ref 36.6–50.3)
Hemoglobin: 6.5 g/dL — ABNORMAL LOW (ref 12.1–17.0)
Immature Granulocytes: 1 % — ABNORMAL HIGH (ref 0–0.5)
Lymphocytes %: 23 % (ref 12–49)
Lymphocytes Absolute: 1.1 10*3/uL (ref 0.8–3.5)
MCH: 26.6 PG (ref 26.0–34.0)
MCHC: 29.4 g/dL — ABNORMAL LOW (ref 30.0–36.5)
MCV: 90.6 FL (ref 80.0–99.0)
MPV: 9.2 FL (ref 8.9–12.9)
Monocytes %: 8 % (ref 5–13)
Monocytes Absolute: 0.4 10*3/uL (ref 0.0–1.0)
Neutrophils %: 65 % (ref 32–75)
Neutrophils Absolute: 3 10*3/uL (ref 1.8–8.0)
Nucleated RBCs: 0 PER 100 WBC
Platelets: 91 10*3/uL — ABNORMAL LOW (ref 150–400)
RBC: 2.44 M/uL — ABNORMAL LOW (ref 4.10–5.70)
RDW: 25.3 % — ABNORMAL HIGH (ref 11.5–14.5)
WBC: 4.6 10*3/uL (ref 4.1–11.1)
nRBC: 0 10*3/uL (ref 0.00–0.01)

## 2022-05-04 LAB — CULTURE, URINE: Colony count: 20000

## 2022-05-04 LAB — MAGNESIUM: Magnesium: 1.8 mg/dL (ref 1.6–2.4)

## 2022-05-04 LAB — POCT GLUCOSE: POC Glucose: 133 mg/dL — ABNORMAL HIGH (ref 65–100)

## 2022-05-04 MED ORDER — SODIUM BICARBONATE 650 MG PO TABS
650 MG | Freq: Three times a day (TID) | ORAL | Status: AC
Start: 2022-05-04 — End: 2022-05-10
  Administered 2022-05-04 – 2022-05-10 (×17): 650 mg via ORAL

## 2022-05-04 MED ORDER — POTASSIUM CHLORIDE CRYS ER 20 MEQ PO TBCR
20 MEQ | Freq: Two times a day (BID) | ORAL | Status: AC
Start: 2022-05-04 — End: 2022-05-08
  Administered 2022-05-05 – 2022-05-08 (×7): 20 meq via ORAL

## 2022-05-04 MED ORDER — DEXTROSE 5 % IV SOLN
5 % | INTRAVENOUS | Status: DC
Start: 2022-05-04 — End: 2022-05-04

## 2022-05-04 MED ORDER — ALBUMIN HUMAN 25 % IV SOLN
25 % | Freq: Three times a day (TID) | INTRAVENOUS | Status: AC
Start: 2022-05-04 — End: 2022-05-05
  Administered 2022-05-04 – 2022-05-05 (×3): 25 g via INTRAVENOUS

## 2022-05-04 MED ORDER — NOREPINEPHRINE-SODIUM CHLORIDE 16-0.9 MG/250ML-% IV SOLN
INTRAVENOUS | Status: AC
Start: 2022-05-04 — End: 2022-05-05

## 2022-05-04 MED ORDER — POTASSIUM CHLORIDE CRYS ER 20 MEQ PO TBCR
20 MEQ | Freq: Once | ORAL | Status: AC
Start: 2022-05-04 — End: 2022-05-04
  Administered 2022-05-04: 18:00:00 40 meq via ORAL

## 2022-05-04 MED ORDER — SODIUM CHLORIDE 0.9 % IV SOLN
0.9 | INTRAVENOUS | Status: DC | PRN
Start: 2022-05-04 — End: 2022-05-10

## 2022-05-04 MED FILL — DESITIN 40 % EX PSTE: 40 % | CUTANEOUS | Qty: 57

## 2022-05-04 MED FILL — DIFICID 200 MG PO TABS: 200 MG | ORAL | Qty: 1

## 2022-05-04 MED FILL — NYSTATIN 100000 UNIT/GM EX OINT: 100000 UNIT/GM | CUTANEOUS | Qty: 30

## 2022-05-04 NOTE — Progress Notes (Signed)
Patient was transferred to ICU for higher level of care

## 2022-05-04 NOTE — Care Coordination-Inpatient (Signed)
Chart reviewed, DCP remains for patient to d/c to [facility] once medically stable, updates sent via careport.  Patient will require insurance auth prior to d/c, CM will need updated PT/OT notes within 48 hours of starting auth.    CM continues to follow and monitor for needs.

## 2022-05-04 NOTE — Progress Notes (Signed)
Pulmonary Progress Note    Subjective:   Daily Progress Note: 05/04/2022 4:21 PM    CC:  70 years old patient with cirrhosis and ascites, acute kidney failure possible ATN, chronic C. difficile colitis, severe malnutrition with worsening status of nausea vomiting with no abdominal pain on nonimprovement renal function  HPI:      Review of Systems  Pertinent items are noted in HPI.    Objective:     BP (!) 119/59   Pulse 93   Temp 97.8 F (36.6 C) (Oral)   Resp 20   Ht 1.803 m (5' 10.98")   Wt 74.5 kg (164 lb 5.3 oz)   SpO2 100%   BMI 22.93 kg/m         Temp (24hrs), Avg:97.8 F (36.6 C), Min:97.5 F (36.4 C), Max:97.9 F (36.6 C)      09/05 0701 - 09/05 1900  In: 60 [P.O.:60]  Out: 200 [Urine:200]  09/03 1901 - 09/05 0700  In: 121.7 [I.V.:20]  Out: 150 [Urine:150]    BP (!) 119/59   Pulse 93   Temp 97.8 F (36.6 C) (Oral)   Resp 20   Ht 1.803 m (5' 10.98")   Wt 74.5 kg (164 lb 5.3 oz)   SpO2 100%   BMI 22.93 kg/m   General appearance: alert, appears stated age, cooperative, and toxic  Head: Normocephalic, without obvious abnormality, atraumatic  Nose: Nares normal. Septum midline. Mucosa normal. No drainage or sinus tenderness.  Neck: no adenopathy, no carotid bruit, no JVD, supple, symmetrical, trachea midline, and thyroid not enlarged, symmetric, no tenderness/mass/nodules  Lungs: clear to auscultation bilaterally  Heart: regular rate and rhythm, S1, S2 normal, no murmur, click, rub or gallop  Abdomen: soft, non-tender; bowel sounds normal; no masses,  no organomegaly  Extremities: extremities normal, atraumatic, no cyanosis or edema  Pulses: 2+ and symmetric  Skin: Skin color, texture, turgor normal. No rashes or lesions        Data Review    Recent Results (from the past 24 hour(s))   Sodium, urine, random    Collection Time: 05/03/22  4:59 PM   Result Value Ref Range    SODIUM, RANDOM URINE 15 mmol/L   Potassium, urine, random    Collection Time: 05/03/22  4:59 PM   Result Value Ref Range     POTASSIUM, RANDOM URINE 22 mmol/L   Creatinine, Random Urine    Collection Time: 05/03/22  4:59 PM   Result Value Ref Range    Creatinine, Ur 150.00 mg/dL   Protein / creatinine ratio, urine    Collection Time: 05/03/22  4:59 PM   Result Value Ref Range    Protein, Urine, Random 215 (H) 0.0 - 11.9 mg/dL    Creatinine, Ur 437.35 mg/dL    PROTEIN/CREAT RATIO URINE RAN 1.4     Protein, urine, random    Collection Time: 05/03/22  4:59 PM   Result Value Ref Range    Protein, Urine, Random 227 (H) 0.0 - 11.9 mg/dL   Renal Function Panel    Collection Time: 05/04/22  5:48 AM   Result Value Ref Range    Sodium 142 136 - 145 mmol/L    Potassium 3.1 (L) 3.5 - 5.1 mmol/L    Chloride 115 (H) 97 - 108 mmol/L    CO2 20 (L) 21 - 32 mmol/L    Anion Gap 7 5 - 15 mmol/L    Glucose 111 (H) 65 - 100 mg/dL  BUN 43 (H) 6 - 20 mg/dL    Creatinine 8.41 (H) 0.70 - 1.30 mg/dL    Bun/Cre Ratio 8 (L) 12 - 20      Est, Glom Filt Rate 10 (L) >60 ml/min/1.31m2    Calcium 7.5 (L) 8.5 - 10.1 mg/dL    Phosphorus 4.0 2.6 - 4.7 mg/dL    Albumin 2.2 (L) 3.5 - 5.0 g/dL   Magnesium    Collection Time: 05/04/22  5:48 AM   Result Value Ref Range    Magnesium 1.8 1.6 - 2.4 mg/dL   CBC with Auto Differential    Collection Time: 05/04/22  5:48 AM   Result Value Ref Range    WBC 4.6 4.1 - 11.1 K/uL    RBC 2.44 (L) 4.10 - 5.70 M/uL    Hemoglobin 6.5 (L) 12.1 - 17.0 g/dL    Hematocrit 32.4 (L) 36.6 - 50.3 %    MCV 90.6 80.0 - 99.0 FL    MCH 26.6 26.0 - 34.0 PG    MCHC 29.4 (L) 30.0 - 36.5 g/dL    RDW 40.1 (H) 02.7 - 14.5 %    Platelets 91 (L) 150 - 400 K/uL    MPV 9.2 8.9 - 12.9 FL    Nucleated RBCs 0.0 0.0 PER 100 WBC    nRBC 0.00 0.00 - 0.01 K/uL    Neutrophils % 65 32 - 75 %    Lymphocytes % 23 12 - 49 %    Monocytes % 8 5 - 13 %    Eosinophils % 3 0 - 7 %    Basophils % 0 0 - 1 %    Immature Granulocytes 1 (H) 0 - 0.5 %    Neutrophils Absolute 3.0 1.8 - 8.0 K/UL    Lymphocytes Absolute 1.1 0.8 - 3.5 K/UL    Monocytes Absolute 0.4 0.0 - 1.0 K/UL     Eosinophils Absolute 0.1 0.0 - 0.4 K/UL    Basophils Absolute 0.0 0.0 - 0.1 K/UL    Absolute Immature Granulocyte 0.0 0.00 - 0.04 K/UL    Differential Type Smear Scanned      RBC Comment Anisocytosis  1+        RBC Comment Ovalocytes  1+       POCT Glucose    Collection Time: 05/04/22 12:30 PM   Result Value Ref Range    POC Glucose 133 (H) 65 - 100 mg/dL    Performed by: Sharlynn Oliphant CHELSEE    TYPE AND SCREEN    Collection Time: 05/04/22 12:45 PM   Result Value Ref Range    Crossmatch expiration date 05/07/2022,2359     ABO/Rh A Negative     Antibody Screen Negative     Unit Number [id number]     Product Code Blood Bank RC LR     Unit Divison 00     Dispense Status Blood Bank Allocated     Transfusion Status Ok to transfuse     Crossmatch Result Compatible        Current Facility-Administered Medications   Medication Dose Route Frequency    norepinephrine (LEVOPHED) 16 mg in sodium chloride 0.9 % 250 mL infusion  0.5-20 mcg/min IntraVENous Continuous    sodium bicarbonate tablet 650 mg  650 mg Oral TID    albumin human 25% IV solution 25 g  25 g IntraVENous Q8H    potassium chloride (KLOR-CON M) extended release tablet 20 mEq  20 mEq Oral BID    0.9 %  sodium chloride infusion   IntraVENous PRN    fluconazole (DIFLUCAN) tablet 200 mg  200 mg Oral Daily    cholestyramine light packet 4 g  4 g Oral TID    Fidaxomicin (DIFICID) tablet 200 mg  200 mg Oral BID    calcium carbonate (TUMS) chewable tablet 500 mg  500 mg Oral TID    traZODone (DESYREL) tablet 50 mg  50 mg Oral Nightly    nystatin (MYCOSTATIN) ointment   Topical BID    zinc oxide 40 % paste   Topical BID    acidophilus/citrus pectin 1 tablet  1 tablet Oral Daily    sodium chloride flush 0.9 % injection 5-40 mL  5-40 mL IntraVENous 2 times per day    sodium chloride flush 0.9 % injection 5-40 mL  5-40 mL IntraVENous PRN    0.9 % sodium chloride infusion   IntraVENous PRN    ondansetron (ZOFRAN-ODT) disintegrating tablet 4 mg  4 mg Oral Q8H PRN    Or     ondansetron (ZOFRAN) injection 4 mg  4 mg IntraVENous Q6H PRN    acetaminophen (TYLENOL) tablet 650 mg  650 mg Oral Q6H PRN    Or    acetaminophen (TYLENOL) suppository 650 mg  650 mg Rectal Q6H PRN    metronidazole (FLAGYL) 500 mg in 0.9% NaCl 100 mL IVPB premix  500 mg IntraVENous Q8H         Assessment/Plan:     Principal Problem:    Acute kidney injury (AKI) with acute tubular necrosis (ATN) (HCC)  Active Problems:    Acute kidney injury (HCC)    Clostridioides difficile infection    Colitis    Hypokalemia  Resolved Problems:    * No resolved hospital problems. *  *D/W Nephrology  Sepsis severe transfer to ICU for possible pressors  Volume depletion continue with IV fluids          Total time spent with patient: 40 minutes.

## 2022-05-04 NOTE — Plan of Care (Signed)
Problem: Discharge Planning  Goal: Discharge to home or other facility with appropriate resources  05/04/2022 1044 by Jeryl Columbia, LPN  Outcome: Progressing  Flowsheets (Taken 05/03/2022 2125 by Berlin Hun, RN)  Discharge to home or other facility with appropriate resources: Identify barriers to discharge with patient and caregiver  05/03/2022 2124 by Berlin Hun, RN  Outcome: Progressing     Problem: Skin/Tissue Integrity  Goal: Absence of new skin breakdown  Description: 1.  Monitor for areas of redness and/or skin breakdown  2.  Assess vascular access sites hourly  3.  Every 4-6 hours minimum:  Change oxygen saturation probe site  4.  Every 4-6 hours:  If on nasal continuous positive airway pressure, respiratory therapy assess nares and determine need for appliance change or resting period.  05/04/2022 1044 by Jeryl Columbia, LPN  Outcome: Progressing  05/03/2022 2124 by Berlin Hun, RN  Outcome: Progressing     Problem: Safety - Adult  Goal: Free from fall injury  05/04/2022 1044 by Jeryl Columbia, LPN  Outcome: Progressing  05/03/2022 2124 by Berlin Hun, RN  Outcome: Progressing     Problem: Skin/Tissue Integrity - Adult  Goal: Skin integrity remains intact  05/04/2022 1044 by Jeryl Columbia, LPN  Outcome: Progressing  Flowsheets  Taken 05/04/2022 0800 by Jeryl Columbia, LPN  Skin Integrity Remains Intact: Monitor for areas of redness and/or skin breakdown  Taken 05/03/2022 2125 by Berlin Hun, RN  Skin Integrity Remains Intact: Monitor for areas of redness and/or skin breakdown  05/03/2022 2124 by Berlin Hun, RN  Outcome: Progressing  Goal: Incisions, wounds, or drain sites healing without S/S of infection  05/04/2022 1044 by Jeryl Columbia, LPN  Outcome: Progressing  05/03/2022 2124 by Berlin Hun, RN  Outcome: Progressing  Goal: Oral mucous membranes remain intact  05/04/2022 1044 by Jeryl Columbia, LPN  Outcome: Progressing  05/03/2022 2124 by Berlin Hun, RN  Outcome: Progressing      Problem: Musculoskeletal - Adult  Goal: Return mobility to safest level of function  05/04/2022 1044 by Jeryl Columbia, LPN  Outcome: Progressing  05/03/2022 2124 by Berlin Hun, RN  Outcome: Progressing  Goal: Maintain proper alignment of affected body part  05/04/2022 1044 by Jeryl Columbia, LPN  Outcome: Progressing  05/03/2022 2124 by Berlin Hun, RN  Outcome: Progressing  Goal: Return ADL status to a safe level of function  05/04/2022 1044 by Jeryl Columbia, LPN  Outcome: Progressing  05/03/2022 2124 by Berlin Hun, RN  Outcome: Progressing     Problem: Metabolic/Fluid and Electrolytes - Adult  Goal: Electrolytes maintained within normal limits  Outcome: Progressing  Goal: Hemodynamic stability and optimal renal function maintained  Outcome: Progressing  Goal: Glucose maintained within prescribed range  Outcome: Progressing     Problem: Chronic Conditions and Co-morbidities  Goal: Patient's chronic conditions and co-morbidity symptoms are monitored and maintained or improved  Outcome: Progressing     Problem: Nutrition Deficit:  Goal: Optimize nutritional status  Outcome: Progressing     Problem: Gastrointestinal - Adult  Goal: Minimal or absence of nausea and vomiting  Outcome: Progressing     Problem: Genitourinary - Adult  Goal: Absence of urinary retention  Outcome: Progressing     Problem: Hematologic - Adult  Goal: Maintains hematologic stability  Outcome: Progressing     Problem: Infection - Adult  Goal: Absence of infection at discharge  Outcome: Progressing

## 2022-05-04 NOTE — Progress Notes (Signed)
OT tx session attempted at 1056, however per RN pt transferring to ICU for higher level of care secondary to recent labs and hypotension.  Once transferred to ICU, pt will need new OT orders for continuation of OT services.  Will continue to follow pt and attempt tx session at a later time as time allows.  Thank you.

## 2022-05-04 NOTE — Progress Notes (Signed)
Attempted PT reassessment this AM, per RN pt is transferring to ICU for HLOC 2/2 recent labs and hypotension. Will need new PT orders following ICU transfer for continuation of PT services. Thank you.

## 2022-05-04 NOTE — Progress Notes (Signed)
Infectious Disease Progress Note          Subjective:   Pt seen and examined at bedside. Ongoing diarrhea, hypotensive this morning, transferred to the ICU for close monitoring. Ongoing loose stools, flexiseal replaced   Objective:   Physical Exam:     BP (!) 119/59   Pulse 93   Temp 97.8 F (36.6 C) (Oral)   Resp 20   Ht 5' 10.98" (1.803 m)   Wt 164 lb 5.3 oz (74.5 kg)   SpO2 100%   BMI 22.93 kg/m    O2 Device: None (Room air)    Temp (24hrs), Avg:97.8 F (36.6 C), Min:97.5 F (36.4 C), Max:97.9 F (36.6 C)    09/05 0701 - 09/05 1900  In: 60 [P.O.:60]  Out: 200 [Urine:200]   09/03 1901 - 09/05 0700  In: 121.7 [I.V.:20]  Out: 150 [Urine:150]    General: NAD, AAO x 4  HEENT: PERLA, Moist mucosa   Lungs: CTA b/l, decreased at the bases, no wheeze/rhonchi   Heart: S1S2+, RRR, no murmur  Abdo: Soft, NT, ND, +BS   GU: indwelling foley cath   Exts: +2 pitting edema, + pulses b/l   Skin: Generalized rash, erythematous    Data Review:       Recent Days:    Recent Labs     05/04/22  0548   WBC 4.6   HGB 6.5*   HCT 22.1*   PLT 91*       Recent Labs     05/02/22  0559 05/03/22  0553 05/04/22  0548   BUN 38* 42* 43*   CREATININE 5.47* 5.56* 5.62*         No results found for: CRP     Microbiology     Results       Procedure Component Value Units Date/Time    Culture, Urine [[id number]] Collected: 05/01/22 1545    Order Status: Completed Specimen: Urine Updated: 05/04/22 1225     Special Requests No Special Requests        Colony count 20,000        Colony count colonies/ml        Culture       Mixed urogenital flora isolated          Clostridium Difficile Toxin/Antigen [[id number]]  (Abnormal) Collected: May 26, 2022 1215    Order Status: Completed Specimen: Stool Updated: May 26, 2022 1511     GDH Antigen Positive        C difficile Toxin, EIA Positive        Comment: Results verified, phoned to and read back by Joellyn Rued RN AT 1510 ON [id number] [facility]        C Diff Toxin Interpretation       Positive  for toxigenic C. difficile                   Diagnostic   CT ABDOMEN PELVIS WO CONTRAST Additional Contrast? None    Result Date: 04/25/2022  1. Diffuse colitis. 2. 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 3. New mild to moderate right pleural effusion with bibasilar atelectasis. New 14 mm left lower lobe nodular lung opacity. Recommend follow-up in 3 months. 4. Refer to above findings for complete details.     XR CHEST PORTABLE    Result Date: 04/25/2022  Small right pleural effusion with underlying atelectasis.     [place] RETROPERITONEAL COMPLETE    Result Date: 04/27/2022  1.  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 2.  Layering debris in the bladder, correlate with urinalysis. 3.  Splenomegaly. 4.  Small volume ascites and right pleural effusion.      XR ABDOMEN (2 VIEWS)    Result Date: 04/29/2022  1.  No evidence of ileus or obstruction. 2.  Small right and trace left pleural effusions.       Assessment/Plan     Severe C.diff infection w  Diffuse colitis on CT abdo/pel (08/27)         Ongoing episodes of loose stools, flexiseal replaced         On day # 6/10 of Fidoxamicin, and day #10/14 of IV metronidazole         Repeat CT abdo/pel to eval for toxic mega colon given ongoing diarrhea        Routine labs in the morning     2. Mucocutaneous candidiasis involving perineum      Continue topical and IV antifungal therapy       Monitor for skin break down     3. Sepsis due to above, in the ICU for close  monitoring       S/p fluids, not on pressor support. Afebrile w a normal WBC on routine labs       Continue antibiotics as above     4. AKI Cr remains elevated, Foley cath in place      5. Severe deconditioning failure to thrive, due to acute illness     6. Acute on chronic anemia: hgb of 6.5 on routine labs     Jene Every, MD    05/04/2022

## 2022-05-04 NOTE — Progress Notes (Signed)
[facility]     Renal Daily Progress Note:     Subjective:  Not improved, BP low with decreased Hgb. Set for transfer to Icu. Poor appetite but no nausea or vomiting. Mild abdominal discomfort.   Diarrhea still present but may be improved a bit; had rectal tube removed.  No pain, no shortness of breath, no nausea or vomiting. Serum creatine up a bit.     Review of Systems  Pertinent items are noted in HPI.    Objective:     BP (!) 88/70   Pulse 89   Temp 97.7 F (36.5 C) (Oral)   Resp 19   Ht 1.803 m (5' 10.98")   Wt 74.5 kg (164 lb 5.3 oz)   SpO2 98%   BMI 22.93 kg/m   Temp (24hrs), Avg:97.9 F (36.6 C), Min:97.5 F (36.4 C), Max:98.6 F (37 C)        Intake/Output Summary (Last 24 hours) at 05/04/2022 1145  Last data filed at 05/04/2022 0944  Gross per 24 hour   Intake 171.67 ml   Output 200 ml   Net -28.33 ml     Current Facility-Administered Medications   Medication Dose Route Frequency    norepinephrine (LEVOPHED) 16 mg in sodium chloride 0.9 % 250 mL infusion  0.5-20 mcg/min IntraVENous Continuous    fluconazole (DIFLUCAN) tablet 200 mg  200 mg Oral Daily    potassium chloride (KLOR-CON M) extended release tablet 10 mEq  10 mEq Oral BID    dextrose 5 % and 0.45 % NaCl with KCl 20 mEq infusion   IntraVENous Continuous    cholestyramine light packet 4 g  4 g Oral TID    Fidaxomicin (DIFICID) tablet 200 mg  200 mg Oral BID    calcium carbonate (TUMS) chewable tablet 500 mg  500 mg Oral TID    traZODone (DESYREL) tablet 50 mg  50 mg Oral Nightly    sodium bicarbonate tablet 650 mg  650 mg Oral BID    nystatin (MYCOSTATIN) ointment   Topical BID    zinc oxide 40 % paste   Topical BID    acidophilus/citrus pectin 1 tablet  1 tablet Oral Daily    sodium chloride flush 0.9 % injection 5-40 mL  5-40 mL IntraVENous 2 times per day    sodium chloride flush 0.9 % injection 5-40 mL  5-40 mL IntraVENous PRN    0.9 % sodium chloride infusion   IntraVENous PRN    ondansetron (ZOFRAN-ODT) disintegrating tablet  4 mg  4 mg Oral Q8H PRN    Or    ondansetron (ZOFRAN) injection 4 mg  4 mg IntraVENous Q6H PRN    acetaminophen (TYLENOL) tablet 650 mg  650 mg Oral Q6H PRN    Or    acetaminophen (TYLENOL) suppository 650 mg  650 mg Rectal Q6H PRN    metronidazole (FLAGYL) 500 mg in 0.9% NaCl 100 mL IVPB premix  500 mg IntraVENous Q8H       Physical Exam: appears stated age, cooperative, alert, more interactive and no distress  Head: Normocephalic, without obvious abnormality, atraumatic  Eyes:  anicteric  Neck: no adenopathy, no JVD, supple, symmetrical, trachea midline, and thyroid not enlarged, symmetric,  Lungs: clear to auscultation bilaterally  Heart:  regular rate and rhythm, and no S3 or S4  Abdomen: soft, non-tender; bowel sounds normal; no masses,  no organomegaly- no guarding  Extremities:  no edema  Neurologic: Grossly normal -alert and oriented x3  GU: foley  with dark urine       Data Review:     LABS:  Recent Labs     05/04/22  0548 05/03/22  0553 05/02/22  0559 05/01/22  0519 04/30/22  0604   NA 142 143 143   < > 143   K 3.1* 3.4* 3.2*   < > 3.5   CL 115* 115* 114*   < > 113*   CO2 20* 21 22   < > 22   BUN 43* 42* 38*   < > 34*   CREATININE 5.62* 5.56* 5.47*   < > 4.75*   CALCIUM 7.5* 7.2* 7.3*   < > 7.0*   LABALBU 2.2* 1.6*  --   --  1.9*   PHOS 4.0 3.7 4.1  --  3.2   MG 1.8 1.9 2.0  --   --     < > = values in this interval not displayed.     Recent Labs     05/04/22  0548 05/01/22  0519 04/30/22  0606   WBC 4.6 4.5 4.5   HGB 6.5* 7.4* 6.9*   HCT 22.1* 24.2* 22.1*   PLT 91* 76* 97*   Urine 05/03/22 16:59  Protein, Urine, Random: 215 (H)    227 (H)  Creatinine, Ur: 153.00    150.00  PROTEIN/CREAT RATIO URINE RAN: 1.4  SODIUM, RANDOM URINE: 15  FeNa <1%    Radiology  CT ABDOMEN PELVIS WO CONTRAST Additional Contrast? None    Result Date: 04/25/2022  1. Diffuse colitis. 2. 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 3. New mild to moderate right pleural effusion with bibasilar atelectasis. New 14 mm left lower lobe  nodular lung opacity. Recommend follow-up in 3 months. 4. Refer to above findings for complete details.     XR CHEST PORTABLE    Result Date: 04/25/2022  Small right pleural effusion with underlying atelectasis.      Assessment:   Renal Specific Problems  AKI- renal function worsening, has foley. Repeat urine studies are still consistent with volume depletion.  The fractional excretion of sodium remains less than 1%., initial urine studies suggested volume depletion. -Still active diarrhea with poor po intake.  The current FeNa suggest renal recovery is imminent once volume is restored  Anemia  Hypokalemia-  Hypoalbumin  Metabolic acidosis-resolved  C. difficile with significant diarrhea > 10 days--improving  Anorexia resolving, patient now hungry  UTI? No culture sent        Plan:     Obtain/ Order: labs/cultures/radiology/procedures:  renal panel in a.m.  Repeat urine Na, Creat, K and protein    Therapeutic:    Increase nutritional support  Transfuse to hemoglobin greater than 7  Oral KCL- increase to 20 meq bid x 3 more days  Cont IVF to 0.45NS  with 20 KCL/l at 100 ml/hr  IV albumin -we will continue for another 24 hours  F/u  magnesium and phosphorus  Oral NaHCO3, increase to tid  Keep foley for strict Is and Os  Strict Is and Os  No indication for RRT at this time    Avoid nonsteroidal anti-inflammatory agents  Trazodone for sleep  Follow-up renal function daily      Discussed with patient, nurse and Dr. Court Joy

## 2022-05-04 NOTE — Progress Notes (Signed)
Received order for midline placement on this patient. However, after reviewing the patients chart patient does not meed midline criteria. I went and evaluated the patient who does have 2 working IV's at this time. I spoke with the primary RN who was unaware of the current plan and stated that the order was placed by Dr. Court Joy. She was under the assumption that the patient needed a midline for a certain antibiotic that needed to be given. I made her aware that a midline was not appropriate at this time and to call the VAT if there is any other needs for this patient from a vascular access standpoint.

## 2022-05-05 ENCOUNTER — Inpatient Hospital Stay: Payer: MEDICARE | Primary: Internal Medicine

## 2022-05-05 ENCOUNTER — Inpatient Hospital Stay: Admit: 2022-05-05 | Payer: MEDICARE | Primary: Internal Medicine

## 2022-05-05 ENCOUNTER — Inpatient Hospital Stay: Admit: 2022-05-06 | Payer: MEDICARE | Primary: Internal Medicine

## 2022-05-05 LAB — COMPREHENSIVE METABOLIC PANEL
ALT: <6 U/L — ABNORMAL LOW (ref 12–78)
AST: 14 U/L — ABNORMAL LOW (ref 15–37)
Albumin/Globulin Ratio: 0.6 — ABNORMAL LOW (ref 1.1–2.2)
Albumin: 2.4 g/dL — ABNORMAL LOW (ref 3.5–5.0)
Alk Phosphatase: 43 U/L — ABNORMAL LOW (ref 45–117)
Anion Gap: 8 mmol/L (ref 5–15)
BUN: 43 mg/dL — ABNORMAL HIGH (ref 6–20)
Bun/Cre Ratio: 7 — ABNORMAL LOW (ref 12–20)
CO2: 18 mmol/L — ABNORMAL LOW (ref 21–32)
Calcium: 7.4 mg/dL — ABNORMAL LOW (ref 8.5–10.1)
Chloride: 115 mmol/L — ABNORMAL HIGH (ref 97–108)
Creatinine: 5.84 mg/dL — ABNORMAL HIGH (ref 0.70–1.30)
Est, Glom Filt Rate: 10 mL/min/{1.73_m2} — ABNORMAL LOW (ref >60)
Globulin: 3.7 g/dL (ref 2.0–4.0)
Glucose: 105 mg/dL — ABNORMAL HIGH (ref 65–100)
Potassium: 3.4 mmol/L — ABNORMAL LOW (ref 3.5–5.1)
Sodium: 141 mmol/L (ref 136–145)
Total Bilirubin: 0.7 mg/dL (ref 0.2–1.0)
Total Protein: 6.1 g/dL — ABNORMAL LOW (ref 6.4–8.2)

## 2022-05-05 LAB — CBC WITH AUTO DIFFERENTIAL
Absolute Immature Granulocyte: 0.1 10*3/uL — ABNORMAL HIGH (ref 0.00–0.04)
Basophils %: 0 % (ref 0–1)
Basophils Absolute: 0 10*3/uL (ref 0.0–0.1)
Eosinophils %: 2 % (ref 0–7)
Eosinophils Absolute: 0.1 10*3/uL (ref 0.0–0.4)
Hematocrit: 24.4 % — ABNORMAL LOW (ref 36.6–50.3)
Hemoglobin: 7.6 g/dL — ABNORMAL LOW (ref 12.1–17.0)
Immature Granulocytes: 1 % — ABNORMAL HIGH (ref 0–0.5)
Lymphocytes %: 22 % (ref 12–49)
Lymphocytes Absolute: 1.1 10*3/uL (ref 0.8–3.5)
MCH: 27.9 PG (ref 26.0–34.0)
MCHC: 31.1 g/dL (ref 30.0–36.5)
MCV: 89.7 FL (ref 80.0–99.0)
MPV: 9.2 FL (ref 8.9–12.9)
Monocytes %: 8 % (ref 5–13)
Monocytes Absolute: 0.4 10*3/uL (ref 0.0–1.0)
Neutrophils %: 67 % (ref 32–75)
Neutrophils Absolute: 3.2 10*3/uL (ref 1.8–8.0)
Nucleated RBCs: 0 PER 100 WBC
Platelets: 96 10*3/uL — ABNORMAL LOW (ref 150–400)
RBC: 2.72 M/uL — ABNORMAL LOW (ref 4.10–5.70)
RDW: 23.7 % — ABNORMAL HIGH (ref 11.5–14.5)
WBC: 4.9 10*3/uL (ref 4.1–11.1)
nRBC: 0 10*3/uL (ref 0.00–0.01)

## 2022-05-05 LAB — TYPE AND SCREEN
ABO/Rh: A NEG
Antibody Screen: NEGATIVE
Unit Divison: 0
Unit Divison: 0

## 2022-05-05 LAB — POCT GLUCOSE: POC Glucose: 112 mg/dL — ABNORMAL HIGH (ref 65–100)

## 2022-05-05 LAB — RENAL FUNCTION PANEL
Albumin: 2.4 g/dL — ABNORMAL LOW (ref 3.5–5.0)
Anion Gap: 8 mmol/L (ref 5–15)
BUN: 45 mg/dL — ABNORMAL HIGH (ref 6–20)
Bun/Cre Ratio: 8 — ABNORMAL LOW (ref 12–20)
CO2: 18 mmol/L — ABNORMAL LOW (ref 21–32)
Calcium: 7.3 mg/dL — ABNORMAL LOW (ref 8.5–10.1)
Chloride: 115 mmol/L — ABNORMAL HIGH (ref 97–108)
Creatinine: 5.73 mg/dL — ABNORMAL HIGH (ref 0.70–1.30)
Est, Glom Filt Rate: 10 mL/min/{1.73_m2} — ABNORMAL LOW (ref >60)
Glucose: 107 mg/dL — ABNORMAL HIGH (ref 65–100)
Phosphorus: 3.4 mg/dL (ref 2.6–4.7)
Potassium: 3.4 mmol/L — ABNORMAL LOW (ref 3.5–5.1)
Sodium: 141 mmol/L (ref 136–145)

## 2022-05-05 LAB — PROCALCITONIN: Procalcitonin: 0.54 ng/mL — ABNORMAL HIGH

## 2022-05-05 LAB — MAGNESIUM: Magnesium: 1.8 mg/dL (ref 1.6–2.4)

## 2022-05-05 LAB — C-REACTIVE PROTEIN: CRP: 2.55 mg/dL — ABNORMAL HIGH (ref 0.00–0.60)

## 2022-05-05 MED ORDER — DEXTROSE-NACL 5-0.45 % IV SOLN
5-0.45 % | INTRAVENOUS | Status: AC
Start: 2022-05-05 — End: 2022-05-06

## 2022-05-05 MED ORDER — ALBUMIN HUMAN 25 % IV SOLN
25 % | Freq: Three times a day (TID) | INTRAVENOUS | Status: DC
Start: 2022-05-05 — End: 2022-05-04

## 2022-05-05 MED ORDER — SODIUM CHLORIDE 0.9 % IV SOLN
0.9 % | INTRAVENOUS | Status: DC | PRN
Start: 2022-05-05 — End: 2022-05-05

## 2022-05-05 MED FILL — ONDANSETRON HCL 4 MG/2ML IJ SOLN: 4 MG/2ML | INTRAMUSCULAR | Qty: 2

## 2022-05-05 MED FILL — DIFICID 200 MG PO TABS: 200 MG | ORAL | Qty: 1

## 2022-05-05 MED FILL — DEXTROSE-NACL 5-0.45 % IV SOLN: INTRAVENOUS | Qty: 1000

## 2022-05-05 MED FILL — NYSTATIN 100000 UNIT/GM EX OINT: 100000 UNIT/GM | CUTANEOUS | Qty: 30

## 2022-05-05 MED FILL — METRONIDAZOLE 500 MG/100ML IV SOLN: 500 MG/100ML | INTRAVENOUS | Qty: 100

## 2022-05-05 MED FILL — CAL-GEST ANTACID 500 MG PO CHEW: 500 MG | ORAL | Qty: 1

## 2022-05-05 MED FILL — SODIUM BICARBONATE 650 MG PO TABS: 650 MG | ORAL | Qty: 1

## 2022-05-05 MED FILL — DESITIN 40 % EX PSTE: 40 % | CUTANEOUS | Qty: 57

## 2022-05-05 NOTE — Other (Signed)
Patient hemoglobin post op was 8.3. Dr. Mallie Snooks, the anesthesiologist gave a verbal order to transfuse 1 unit PRBC if hemoglobin is greater than 7 or 2 units if hemoglobin is less than 7. Order for 1 unit was put in. Blood bank called to inform the RN that the pathologist did not approve and suggest verifying the order with the physician.     Day shift RN will be informed to follow up with the Dr. Molly Maduro or admitting physician.    Will continue to monitor.

## 2022-05-05 NOTE — Other (Signed)
Nutrition Assessment     Type and Reason for Visit: Reassess (interim)    Nutrition Recommendations/Plan:   Continue ETC diet and snacks between meals  Ensure 3x/day, Juven 2x/day, +banatrol 2x/day  Provide meal assistance as needed  Monitor and record PO intakes, supplement acceptance, and Bms in I/Os     Malnutrition Assessment:  Malnutrition Status: Severe malnutrition    Nutrition Assessment:  Presents to ED from [facility] for evaluation of generalized weakness, decreased PO intake, and abdominal discomfort ongoing x 1 week. No substantial PO intake x 1 week, multiple episodes of loose stools, and lack of appetite per pt family per H&P. ~22% wt loss x 3 months per EMR, nutritionally significant if documented wt's are accurate (no method recorded/stated wt). Pt noted decreasing wt trends x 2-3 months (246# - 158#, 33% wt loss - nutritionally significant if accurate report). Pt reported decreased appetite/intakes x 1-2 wks. Limited PO intake PTA (few eggs/day per pt report). Pt on full liquid diet (coffee for B). Pt amenable to starting ONS to better meet EER and to promote wound healing. Plan to monitor intakes and adjust ONS rec'd prn. Rec'd to advance diet when medically appropriate. (8/30) Chart reviewed for interim/consult for unintended wt loss. Diet advanced to ETC. Pt with poor intakes (0% x 2 meals 8/29, 0% B per pt report). Suspect pt's decreased intake may be r/t food preferences - pt noted disliking meals. Food preferences obtained and recorded. Pt enjoying Ensure - increase to 2x/day. Continue Juven 2x/d to promote wound healing. Pt amenable to snacks between meals to encourage PO intake. Continue to monitor intakes, consider nutrition support if congruent w/ GOC if pt continues to meet <60% of EER. (9/1) Pt endorsed ~50% intake at B, 100% intake of snacks (chicken salad), and 100% intake of ONS. Pt highly motivated to improve PO intakes. Food preferences obtained and recorded. Pt  noted difficulty eating foods on tray that require both hands/arms (difficulty moving L arm) - RN notified, will provide assistance prn. Continue ETC diet, increase frequency of ONS per pt request. Intakes increasing, continue to consider nutrition support if meeting <60% of EER if congruent w/ goals of care. (9/6) Transferred to ICU, hypotensive. Rectal tube reinserted, ongoing diarrhea. Intake of trays poor, <25%- however, good ONS acceptance, >76%. Plan to continue current ONS, snacks, add banatrol r/t c-diff diarrhea. Labs: Na 141, K 3.4, BUN 43, Creat 5.84, Gluc 105. Meds: acidophilus/citrus pectin, calcium carbonate, cholestyramine light, KCl, sodium bicarbonate    Estimated Daily Nutrient Needs:  Energy (kcal):  1863 - 2235 kcals/day (25 - 30 kcals/kg) Weight Used for Energy Requirements: Current     Protein (g):  89 - 104 g/day (1.2 - 1.4 - wound) Weight Used for Protein Requirements: Current        Fluid (ml/day):  1863 - 2235 ml/day (65m/kcal) Method Used for Fluid Requirements: 1 ml/kcal    Nutrition Related Findings:   NFPE deferred r/t contact iso - moderate wasting per visual assessment. No n/v, +diarrhea. 2+ generalized edema. BM 9/6. Wound Type: Multiple, Pressure Injury, Stage II, Surgical Incision (Stage 2 PI (sacral), Incision (L thigh))    Current Nutrition Therapies:    ADULT ORAL NUTRITION SUPPLEMENT; Breakfast, Dinner; Wound Healing Oral Supplement  ADULT ORAL NUTRITION SUPPLEMENT; Lunch, Dinner; Standard High Calorie/High Protein Oral Supplement  ADULT DIET; Easy to Chew; Likes: eggs/coffee (B), Sandwiches w/ lettuce, tomato, mayo (L), Hamburger steak (D)  ADULT ORAL NUTRITION SUPPLEMENT; Breakfast; Diabetic Oral Supplement  Anthropometric Measures:  Height: 180.3 cm (5' 10.98")  Current Body Wt: 164 lb 3.9 oz (74.5 kg)   BMI: 22.9    Nutrition Diagnosis:   Severe malnutrition related to inadequate protein-energy intake as evidenced by Criteria as identified in malnutrition  assessment    Nutrition Interventions:   Food and/or Nutrient Delivery: Continue Current Diet, Start Oral Nutrition Supplement, Continue Oral Nutrition Supplement  Nutrition Education/Counseling: No recommendation at this time  Coordination of Nutrition Care: Continue to monitor while inpatient, Feeding Assistance/Environment Change  Plan of Care discussed with: pt, RN    Goals:  Previous Goal Met: Progressing toward Goal(s)  Goals: PO intake 75% or greater, by next RD assessment    Nutrition Monitoring and Evaluation:   Behavioral-Environmental Outcomes: None Identified  Food/Nutrient Intake Outcomes: Food and Nutrient Intake, Supplement Intake  Physical Signs/Symptoms Outcomes: Meal Time Behavior, Weight    Discharge Planning:    Continue Oral Nutrition Supplement     Lavonna Monarch, [place]  Contact: [phone]

## 2022-05-05 NOTE — Progress Notes (Signed)
Dr. Hollice Espy aware of patients poor urine output.  Orders given.

## 2022-05-05 NOTE — Care Coordination-Inpatient (Signed)
Carlos Petersen has been approved by [employer].  Auth is good for five days.      Accepted ar [employer].        Pollyann Glen, MSW  [phone]

## 2022-05-05 NOTE — Progress Notes (Signed)
[facility]     Renal Daily Progress Note:     Subjective:  Not improved, BP variable, Hgb up post transfusion. Awake and alert. Has diffuse purpura with thrombocytopenia.  Poor appetite but no nausea or vomiting. Mild abdominal discomfort.   Diarrhea still present, rectal tube reinserted yesterday.  Rectal and gluteal pain, skin appears cellulitic. No shortness of breath, no nausea or vomiting. Serum creatinine still trending up despite IVF and blood transfusion. FeNa was low.     Review of Systems  Pertinent items are noted in HPI.    Objective:     BP (!) 113/58   Pulse 88   Temp 98.2 F (36.8 C) (Oral)   Resp 28   Ht 1.803 m (5' 10.98")   Wt 74.5 kg (164 lb 5.3 oz)   SpO2 95%   BMI 22.93 kg/m   Temp (24hrs), Avg:98 F (36.7 C), Min:97.8 F (36.6 C), Max:98.2 F (36.8 C)        Intake/Output Summary (Last 24 hours) at 05/05/2022 1026  Last data filed at 05/05/2022 0600  Gross per 24 hour   Intake 854.17 ml   Output 700 ml   Net 154.17 ml     Current Facility-Administered Medications   Medication Dose Route Frequency    norepinephrine (LEVOPHED) 16 mg in sodium chloride 0.9 % 250 mL infusion  0.5-20 mcg/min IntraVENous Continuous    sodium bicarbonate tablet 650 mg  650 mg Oral TID    potassium chloride (KLOR-CON M) extended release tablet 20 mEq  20 mEq Oral BID    0.9 % sodium chloride infusion   IntraVENous PRN    cholestyramine light packet 4 g  4 g Oral TID    Fidaxomicin (DIFICID) tablet 200 mg  200 mg Oral BID    calcium carbonate (TUMS) chewable tablet 500 mg  500 mg Oral TID    traZODone (DESYREL) tablet 50 mg  50 mg Oral Nightly    nystatin (MYCOSTATIN) ointment   Topical BID    zinc oxide 40 % paste   Topical BID    acidophilus/citrus pectin 1 tablet  1 tablet Oral Daily    sodium chloride flush 0.9 % injection 5-40 mL  5-40 mL IntraVENous 2 times per day    sodium chloride flush 0.9 % injection 5-40 mL  5-40 mL IntraVENous PRN    0.9 % sodium chloride infusion   IntraVENous PRN     ondansetron (ZOFRAN-ODT) disintegrating tablet 4 mg  4 mg Oral Q8H PRN    Or    ondansetron (ZOFRAN) injection 4 mg  4 mg IntraVENous Q6H PRN    acetaminophen (TYLENOL) tablet 650 mg  650 mg Oral Q6H PRN    Or    acetaminophen (TYLENOL) suppository 650 mg  650 mg Rectal Q6H PRN    metronidazole (FLAGYL) 500 mg in 0.9% NaCl 100 mL IVPB premix  500 mg IntraVENous Q8H       Physical Exam: appears stated age, cooperative, alert, more interactive and no distress  Head: Normocephalic, without obvious abnormality, atraumatic  Eyes:  anicteric  Neck: no adenopathy, no JVD, supple, symmetrical, trachea midline, and thyroid not enlarged, symmetric,  Lungs: clear to auscultation bilaterally  Heart:  regular rate and rhythm, and no S3 or S4  Abdomen: soft, non-tender; bowel sounds normal; no masses,  no organomegaly- no guarding  Extremities:  no edema  SKIN: diffuse purpura, cellulitic appearing skin along perineum  Neurologic: Grossly normal -alert and oriented x3  GU:  foley with dark urine       Data Review:     LABS:  Recent Labs     05/05/22  0310 05/04/22  0548 05/03/22  0553   NA 141  141 142 143   K 3.4*  3.4* 3.1* 3.4*   CL 115*  115* 115* 115*   CO2 18*  18* 20* 21   BUN 43*  45* 43* 42*   CREATININE 5.84*  5.73* 5.62* 5.56*   CALCIUM 7.4*  7.3* 7.5* 7.2*   LABALBU 2.4*  2.4* 2.2* 1.6*   PHOS 3.4 4.0 3.7   MG 1.8 1.8 1.9     Recent Labs     05/05/22  0310 05/04/22  0548 05/01/22  0519   WBC 4.9 4.6 4.5   HGB 7.6* 6.5* 7.4*   HCT 24.4* 22.1* 24.2*   PLT 96* 91* 76*   Urine 05/03/22 16:59  Protein, Urine, Random: 215 (H)    227 (H)  Creatinine, Ur: 153.00    150.00  PROTEIN/CREAT RATIO URINE RAN: 1.4  SODIUM, RANDOM URINE: 15  FeNa <1%    Radiology  CT ABDOMEN PELVIS WO CONTRAST Additional Contrast? None    Result Date: 04/25/2022  1. Diffuse colitis. 2. 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 3. New mild to moderate right pleural effusion with bibasilar atelectasis. New 14 mm left lower lobe nodular lung  opacity. Recommend follow-up in 3 months. 4. Refer to above findings for complete details.     XR CHEST PORTABLE    Result Date: 04/25/2022  Small right pleural effusion with underlying atelectasis.     CXR 05-04-22  IMPRESSION:  Small right pleural effusion with underlying atelectasis     Assessment:   Renal Specific Problems  AKI- renal function worsening, has foley. Repeat urine studies are still consistent with volume depletion.  The fractional excretion of sodium remains less than 1%., initial urine studies suggested volume depletion. -Still active diarrhea with poor po intake.  The current FeNa suggest renal recovery is imminent once volume is restored  Anemia  Hypokalemia-  Hypoalbumin  Metabolic acidosis-resolved  C. difficile with significant diarrhea > 10 days- not resolving  Thrombocytopenia with development of purpura  Anorexia resolving, patient now hungry        Plan:     Obtain/ Order: labs/cultures/radiology/procedures:  renal panel in a.m.      Therapeutic:    Increase nutritional support  Transfuse to hemoglobin greater than 7  Oral KCL- increase to 20 meq bid x 3 more days  Cont IVF to 0.45NS  with 20 KCL/l at 100 ml/hr  IV albumin -we will continue for another 24 hours  F/u  magnesium and phosphorus  Oral NaHCO3, increase to tid  Keep foley for strict Is and Os  Strict Is and Os  No indication for RRT at this time    Avoid nonsteroidal anti-inflammatory agents  Trazodone for sleep  Follow-up renal function daily  Have heme see??    Discussed with patient, nurse

## 2022-05-05 NOTE — Plan of Care (Signed)
Problem: Discharge Planning  Goal: Discharge to home or other facility with appropriate resources  Outcome: Progressing  Flowsheets (Taken 05/04/2022 2000)  Discharge to home or other facility with appropriate resources:   Identify barriers to discharge with patient and caregiver   Identify discharge learning needs (meds, wound care, etc)   Arrange for needed discharge resources and transportation as appropriate   Arrange for interpreters to assist at discharge as needed     Problem: Skin/Tissue Integrity  Goal: Absence of new skin breakdown  Description: 1.  Monitor for areas of redness and/or skin breakdown  2.  Assess vascular access sites hourly  3.  Every 4-6 hours minimum:  Change oxygen saturation probe site  4.  Every 4-6 hours:  If on nasal continuous positive airway pressure, respiratory therapy assess nares and determine need for appliance change or resting period.  Outcome: Progressing     Problem: Safety - Adult  Goal: Free from fall injury  Outcome: Progressing  Flowsheets (Taken 05/04/2022 2000)  Free From Fall Injury:   Instruct family/caregiver on patient safety   Based on caregiver fall risk screen, instruct family/caregiver to ask for assistance with transferring infant if caregiver noted to have fall risk factors     Problem: Skin/Tissue Integrity - Adult  Goal: Skin integrity remains intact  Outcome: Progressing  Flowsheets (Taken 05/04/2022 2000)  Skin Integrity Remains Intact:   Monitor for areas of redness and/or skin breakdown   Assess vascular access sites hourly   Every 4-6 hours minimum: Change oxygen saturation probe site   Every 4-6 hours: If on nasal continuous positive airway pressure, respiratory therapy assesses nares and determine need for appliance change or resting period  Goal: Incisions, wounds, or drain sites healing without S/S of infection  Outcome: Progressing  Flowsheets (Taken 05/04/2022 2000)  Incisions, Wounds, or Drain Sites Healing Without Sign and Symptoms of Infection:    ADMISSION and DAILY: Assess and document risk factors for pressure ulcer development   TWICE DAILY: Assess and document dressing/incision, wound bed, drain sites and surrounding tissue   TWICE DAILY: Assess and document skin integrity   Implement wound care per orders   Initiate isolation precautions as appropriate     Problem: Musculoskeletal - Adult  Goal: Return mobility to safest level of function  Outcome: Progressing  Flowsheets (Taken 05/04/2022 2000)  Return Mobility to Safest Level of Function:   Assess patient stability and activity tolerance for standing, transferring and ambulating with or without assistive devices   Assist with transfers and ambulation using safe patient handling equipment as needed   Ensure adequate protection for wounds/incisions during mobilization   Obtain physical therapy/occupational therapy consults as needed   Apply continuous passive motion per provider or physical therapy orders to increase flexion toward goal   Instruct patient/family in ordered activity level  Goal: Maintain proper alignment of affected body part  Outcome: Progressing  Flowsheets (Taken 05/04/2022 2000)  Maintain proper alignment of affected body part:   Support and protect limb and body alignment per provider's orders   Instruct and reinforce with patient and family use of appropriate assistive device and precautions (e.g. spinal or hip dislocation precautions)     Problem: Metabolic/Fluid and Electrolytes - Adult  Goal: Electrolytes maintained within normal limits  Outcome: Progressing  Flowsheets (Taken 05/04/2022 2000)  Electrolytes maintained within normal limits:   Monitor labs and assess patient for signs and symptoms of electrolyte imbalances   Administer electrolyte replacement as ordered   Monitor response  to electrolyte replacements, including repeat lab results as appropriate     Problem: Chronic Conditions and Co-morbidities  Goal: Patient's chronic conditions and co-morbidity symptoms are monitored  and maintained or improved  Outcome: Progressing  Flowsheets (Taken 05/04/2022 2000)  Care Plan - Patient's Chronic Conditions and Co-Morbidity Symptoms are Monitored and Maintained or Improved:   Monitor and assess patient's chronic conditions and comorbid symptoms for stability, deterioration, or improvement   Collaborate with multidisciplinary team to address chronic and comorbid conditions and prevent exacerbation or deterioration   Update acute care plan with appropriate goals if chronic or comorbid symptoms are exacerbated and prevent overall improvement and discharge     Problem: Genitourinary - Adult  Goal: Absence of urinary retention  Outcome: Progressing

## 2022-05-05 NOTE — Progress Notes (Signed)
Infectious Disease Progress Note          Subjective:   Pt seen and examined at bedside. Remains in the ICU for close monitoring, off pressor support, afebrile wbc wnls   Objective:   Physical Exam:     BP (!) 129/56   Pulse 88   Temp 97.6 F (36.4 C)   Resp 16   Ht 5' 10.98" (1.803 m)   Wt 164 lb 5.3 oz (74.5 kg)   SpO2 100%   BMI 22.93 kg/m    O2 Device: None (Room air)    Temp (24hrs), Avg:97.9 F (36.6 C), Min:97.6 F (36.4 C), Max:98.2 F (36.8 C)    09/06 0701 - 09/06 1900  In: 600 [P.O.:600]  Out: -    09/04 1901 - 09/06 0700  In: 1025.8 [P.O.:60; I.V.:10]  Out: 900 [Urine:500]    General: NAD, AAO x 4  HEENT: PERLA, Moist mucosa   Lungs: CTA b/l, decreased at the bases, no wheeze/rhonchi   Heart: S1S2+, RRR, no murmur  Abdo: Soft, NT, ND, +BS   GU: indwelling foley cath   Exts: +2 pitting edema, + pulses b/l   Skin: Generalized rash, erythematous    Data Review:       Recent Days:    Recent Labs     05/04/22  0548 05/05/22  0310   WBC 4.6 4.9   HGB 6.5* 7.6*   HCT 22.1* 24.4*   PLT 91* 96*       Recent Labs     05/03/22  0553 05/04/22  0548 05/05/22  0310   BUN 42* 43* 43*  45*   CREATININE 5.56* 5.62* 5.84*  5.73*         Lab Results   Component Value Date/Time    CRP 2.55 05/05/2022 03:10 AM        Microbiology     Results       Procedure Component Value Units Date/Time    Culture, Urine [[id number]] Collected: 05/01/22 1545    Order Status: Completed Specimen: Urine Updated: 05/04/22 1225     Special Requests No Special Requests        Colony count 20,000        Colony count colonies/ml        Culture       Mixed urogenital flora isolated          Clostridium Difficile Toxin/Antigen [[id number]]  (Abnormal) Collected: 2022/05/06 1215    Order Status: Completed Specimen: Stool Updated: 2022/05/06 1511     [facility] Antigen Positive        C difficile Toxin, EIA Positive        Comment: Results verified, phoned to and read back by Joellyn Rued RN AT 1510 ON [id number] [facility]        C  Diff Toxin Interpretation       Positive for toxigenic C. difficile                   Diagnostic   CT ABDOMEN PELVIS WO CONTRAST Additional Contrast? None    Result Date: 04/25/2022  1. Diffuse colitis. 2. 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 3. New mild to moderate right pleural effusion with bibasilar atelectasis. New 14 mm left lower lobe nodular lung opacity. Recommend follow-up in 3 months. 4. Refer to above findings for complete details.     XR CHEST PORTABLE    Result Date: 04/25/2022  Small right pleural effusion with underlying atelectasis.  [place] RETROPERITONEAL COMPLETE    Result Date: 04/27/2022  1.  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 2.  Layering debris in the bladder, correlate with urinalysis. 3.  Splenomegaly. 4.  Small volume ascites and right pleural effusion.      XR ABDOMEN (2 VIEWS)    Result Date: 04/29/2022  1.  No evidence of ileus or obstruction. 2.  Small right and trace left pleural effusions.       Assessment/Plan     Severe C.diff infection w  Diffuse colitis on CT abdo/pel (08/27)         Remains afebrile w a normal wbc on routine labs, off pressor support         On day #7/10 of Fidoxamicin, and day #11/14 of IV metronidazole         CT abdo/pel ordered, yet to done, will follow results         Routine labs in the morning     2. Mucocutaneous candidiasis involving perineum      Continue topical antifungal therapy, monitor for bacteria superinfection     3. Sepsis due to above, Remains in the ICU, afebrile, off pressor support       WBC WNLs, no new source of infection     4. AKI, Cr staying stable at 5,  Foley cath in place      5. Severe deconditioning failure to thrive, Appetite remains poor     Jene Every, MD    05/05/2022

## 2022-05-05 NOTE — Progress Notes (Signed)
Pulmonary Progress Note    Subjective:   Daily Progress Note: 05/05/2022 5:35 PM    CC:  71 years old patient with cirrhosis and ascites, acute kidney failure possible ATN, chronic C. difficile colitis, severe malnutrition with worsening status of nausea vomiting with no abdominal pain on nonimprovement renal function  HPI: 9/6  No new complaints    Review of Systems  Pertinent items are noted in HPI.    Objective:     BP (!) 129/56   Pulse 88   Temp 97.8 F (36.6 C)   Resp 16   Ht 1.803 m (5' 10.98")   Wt 74.5 kg (164 lb 5.3 oz)   SpO2 100%   BMI 22.93 kg/m         Temp (24hrs), Avg:97.9 F (36.6 C), Min:97.6 F (36.4 C), Max:98.2 F (36.8 C)      09/06 0701 - 09/06 1900  In: 600 [P.O.:600]  Out: -   09/04 1901 - 09/06 0700  In: 1025.8 [P.O.:60; I.V.:10]  Out: 900 [Urine:500]    BP (!) 129/56   Pulse 88   Temp 97.8 F (36.6 C)   Resp 16   Ht 1.803 m (5' 10.98")   Wt 74.5 kg (164 lb 5.3 oz)   SpO2 100%   BMI 22.93 kg/m   General appearance: alert, appears stated age, cooperative, and toxic  Head: Normocephalic, without obvious abnormality, atraumatic  Nose: Nares normal. Septum midline. Mucosa normal. No drainage or sinus tenderness.  Neck: no adenopathy, no carotid bruit, no JVD, supple, symmetrical, trachea midline, and thyroid not enlarged, symmetric, no tenderness/mass/nodules  Lungs: clear to auscultation bilaterally  Heart: regular rate and rhythm, S1, S2 normal, no murmur, click, rub or gallop  Abdomen: soft, non-tender; bowel sounds normal; no masses,  no organomegaly  Extremities: extremities normal, atraumatic, no cyanosis or edema  Pulses: 2+ and symmetric  Skin: Skin color, texture, turgor normal. No rashes or lesions        Data Review    Recent Results (from the past 24 hour(s))   POCT Glucose    Collection Time: 05/04/22  8:43 PM   Result Value Ref Range    POC Glucose 112 (H) 65 - 100 mg/dL    Performed by: Benn Moulder    Renal Function Panel    Collection Time: 05/05/22  3:10  AM   Result Value Ref Range    Sodium 141 136 - 145 mmol/L    Potassium 3.4 (L) 3.5 - 5.1 mmol/L    Chloride 115 (H) 97 - 108 mmol/L    CO2 18 (L) 21 - 32 mmol/L    Anion Gap 8 5 - 15 mmol/L    Glucose 107 (H) 65 - 100 mg/dL    BUN 45 (H) 6 - 20 mg/dL    Creatinine 5.73 (H) 0.70 - 1.30 mg/dL    Bun/Cre Ratio 8 (L) 12 - 20      Est, Glom Filt Rate 10 (L) >60 ml/min/1.35m    Calcium 7.3 (L) 8.5 - 10.1 mg/dL    Phosphorus 3.4 2.6 - 4.7 mg/dL    Albumin 2.4 (L) 3.5 - 5.0 g/dL   Magnesium    Collection Time: 05/05/22  3:10 AM   Result Value Ref Range    Magnesium 1.8 1.6 - 2.4 mg/dL   CBC with Auto Differential    Collection Time: 05/05/22  3:10 AM   Result Value Ref Range    WBC 4.9 4.1 - 11.1 K/uL  RBC 2.72 (L) 4.10 - 5.70 M/uL    Hemoglobin 7.6 (L) 12.1 - 17.0 g/dL    Hematocrit 24.4 (L) 36.6 - 50.3 %    MCV 89.7 80.0 - 99.0 FL    MCH 27.9 26.0 - 34.0 PG    MCHC 31.1 30.0 - 36.5 g/dL    RDW 23.7 (H) 11.5 - 14.5 %    Platelets 96 (L) 150 - 400 K/uL    MPV 9.2 8.9 - 12.9 FL    Nucleated RBCs 0.0 0.0 PER 100 WBC    nRBC 0.00 0.00 - 0.01 K/uL    Neutrophils % 67 32 - 75 %    Lymphocytes % 22 12 - 49 %    Monocytes % 8 5 - 13 %    Eosinophils % 2 0 - 7 %    Basophils % 0 0 - 1 %    Immature Granulocytes 1 (H) 0 - 0.5 %    Neutrophils Absolute 3.2 1.8 - 8.0 K/UL    Lymphocytes Absolute 1.1 0.8 - 3.5 K/UL    Monocytes Absolute 0.4 0.0 - 1.0 K/UL    Eosinophils Absolute 0.1 0.0 - 0.4 K/UL    Basophils Absolute 0.0 0.0 - 0.1 K/UL    Absolute Immature Granulocyte 0.1 (H) 0.00 - 0.04 K/UL    Differential Type Smear Scanned      RBC Comment Anisocytosis  2+       Comprehensive Metabolic Panel    Collection Time: 05/05/22  3:10 AM   Result Value Ref Range    Sodium 141 136 - 145 mmol/L    Potassium 3.4 (L) 3.5 - 5.1 mmol/L    Chloride 115 (H) 97 - 108 mmol/L    CO2 18 (L) 21 - 32 mmol/L    Anion Gap 8 5 - 15 mmol/L    Glucose 105 (H) 65 - 100 mg/dL    BUN 43 (H) 6 - 20 mg/dL    Creatinine 5.84 (H) 0.70 - 1.30 mg/dL    Bun/Cre  Ratio 7 (L) 12 - 20      Est, Glom Filt Rate 10 (L) >60 ml/min/1.79m    Calcium 7.4 (L) 8.5 - 10.1 mg/dL    Total Bilirubin 0.7 0.2 - 1.0 mg/dL    AST 14 (L) 15 - 37 U/L    ALT <6 (L) 12 - 78 U/L    Alk Phosphatase 43 (L) 45 - 117 U/L    Total Protein 6.1 (L) 6.4 - 8.2 g/dL    Albumin 2.4 (L) 3.5 - 5.0 g/dL    Globulin 3.7 2.0 - 4.0 g/dL    Albumin/Globulin Ratio 0.6 (L) 1.1 - 2.2     C-Reactive Protein    Collection Time: 05/05/22  3:10 AM   Result Value Ref Range    CRP 2.55 (H) 0.00 - 0.60 mg/dL   Procalcitonin    Collection Time: 05/05/22  3:10 AM   Result Value Ref Range    Procalcitonin 0.54 (H) 0 ng/mL       Current Facility-Administered Medications   Medication Dose Route Frequency    sodium bicarbonate tablet 650 mg  650 mg Oral TID    potassium chloride (KLOR-CON M) extended release tablet 20 mEq  20 mEq Oral BID    0.9 % sodium chloride infusion   IntraVENous PRN    cholestyramine light packet 4 g  4 g Oral TID    Fidaxomicin (DIFICID) tablet 200 mg  200 mg Oral BID  calcium carbonate (TUMS) chewable tablet 500 mg  500 mg Oral TID    traZODone (DESYREL) tablet 50 mg  50 mg Oral Nightly    nystatin (MYCOSTATIN) ointment   Topical BID    zinc oxide 40 % paste   Topical BID    acidophilus/citrus pectin 1 tablet  1 tablet Oral Daily    sodium chloride flush 0.9 % injection 5-40 mL  5-40 mL IntraVENous 2 times per day    sodium chloride flush 0.9 % injection 5-40 mL  5-40 mL IntraVENous PRN    0.9 % sodium chloride infusion   IntraVENous PRN    ondansetron (ZOFRAN-ODT) disintegrating tablet 4 mg  4 mg Oral Q8H PRN    Or    ondansetron (ZOFRAN) injection 4 mg  4 mg IntraVENous Q6H PRN    acetaminophen (TYLENOL) tablet 650 mg  650 mg Oral Q6H PRN    Or    acetaminophen (TYLENOL) suppository 650 mg  650 mg Rectal Q6H PRN    metronidazole (FLAGYL) 500 mg in 0.9% NaCl 100 mL IVPB premix  500 mg IntraVENous Q8H         Assessment/Plan:     Principal Problem:    Acute kidney injury (AKI) with acute tubular  necrosis (ATN) (HCC)  Active Problems:    Acute kidney injury (HCC)    Clostridioides difficile infection    Colitis    Hypokalemia  Resolved Problems:    * No resolved hospital problems. *  *D/W Nephrology  Sepsis severe transfer to ICU for possible pressors  Volume depletion continue with IV fluids  Malnutrition Severe        Total time spent with patient: 40 minutes.

## 2022-05-06 LAB — CBC WITH AUTO DIFFERENTIAL
Absolute Immature Granulocyte: 0 10*3/uL (ref 0.00–0.04)
Basophils %: 0 % (ref 0–1)
Basophils Absolute: 0 10*3/uL (ref 0.0–0.1)
Eosinophils %: 1 % (ref 0–7)
Eosinophils Absolute: 0.1 10*3/uL (ref 0.0–0.4)
Hematocrit: 23.3 % — ABNORMAL LOW (ref 36.6–50.3)
Hemoglobin: 7.2 g/dL — ABNORMAL LOW (ref 12.1–17.0)
Immature Granulocytes: 0 % (ref 0–0.5)
Lymphocytes %: 22 % (ref 12–49)
Lymphocytes Absolute: 1.1 10*3/uL (ref 0.8–3.5)
MCH: 28.6 PG (ref 26.0–34.0)
MCHC: 30.9 g/dL (ref 30.0–36.5)
MCV: 92.5 FL (ref 80.0–99.0)
MPV: 9.7 FL (ref 8.9–12.9)
Monocytes %: 9 % (ref 5–13)
Monocytes Absolute: 0.5 10*3/uL (ref 0.0–1.0)
Neutrophils %: 68 % (ref 32–75)
Neutrophils Absolute: 3.3 10*3/uL (ref 1.8–8.0)
Nucleated RBCs: 0 PER 100 WBC
Platelet Comment: DECREASED
Platelets: 83 10*3/uL — ABNORMAL LOW (ref 150–400)
RBC: 2.52 M/uL — ABNORMAL LOW (ref 4.10–5.70)
RDW: 24.7 % — ABNORMAL HIGH (ref 11.5–14.5)
WBC: 5 10*3/uL (ref 4.1–11.1)
nRBC: 0 10*3/uL (ref 0.00–0.01)

## 2022-05-06 LAB — POCT GLUCOSE: POC Glucose: 107 mg/dL — ABNORMAL HIGH (ref 65–100)

## 2022-05-06 MED ORDER — BUDESONIDE-FORMOTEROL FUMARATE 160-4.5 MCG/ACT IN AERO
Freq: Two times a day (BID) | RESPIRATORY_TRACT | Status: AC
Start: 2022-05-06 — End: 2022-05-10
  Administered 2022-05-06 – 2022-05-10 (×8): 2 via RESPIRATORY_TRACT

## 2022-05-06 MED ORDER — IPRATROPIUM-ALBUTEROL 0.5-2.5 (3) MG/3ML IN SOLN
0.5-2.5 | RESPIRATORY_TRACT | Status: DC | PRN
Start: 2022-05-06 — End: 2022-05-10
  Administered 2022-05-06: 01:00:00 1 via RESPIRATORY_TRACT

## 2022-05-06 MED FILL — ACIDOPHILUS/CITRUS PECTIN PO TABS: ORAL | Qty: 1

## 2022-05-06 MED FILL — BUDESONIDE-FORMOTEROL FUMARATE 160-4.5 MCG/ACT IN AERO: RESPIRATORY_TRACT | Qty: 0.2

## 2022-05-06 MED FILL — TRAZODONE HCL 50 MG PO TABS: 50 MG | ORAL | Qty: 1

## 2022-05-06 MED FILL — CHOLESTYRAMINE LIGHT 4 G PO PACK: 4 g | ORAL | Qty: 1

## 2022-05-06 MED FILL — SODIUM BICARBONATE 650 MG PO TABS: 650 MG | ORAL | Qty: 1

## 2022-05-06 MED FILL — DEXTROSE-NACL 5-0.45 % IV SOLN: INTRAVENOUS | Qty: 1000

## 2022-05-06 MED FILL — DIFICID 200 MG PO TABS: 200 MG | ORAL | Qty: 1

## 2022-05-06 MED FILL — CAL-GEST ANTACID 500 MG PO CHEW: 500 MG | ORAL | Qty: 1

## 2022-05-06 MED FILL — POTASSIUM CHLORIDE CRYS ER 20 MEQ PO TBCR: 20 MEQ | ORAL | Qty: 1

## 2022-05-06 MED FILL — NYSTATIN 100000 UNIT/GM EX OINT: 100000 UNIT/GM | CUTANEOUS | Qty: 30

## 2022-05-06 MED FILL — ACETAMINOPHEN 650 MG RE SUPP: 650 MG | RECTAL | Qty: 1

## 2022-05-06 MED FILL — DESITIN 40 % EX PSTE: 40 % | CUTANEOUS | Qty: 57

## 2022-05-06 MED FILL — IPRATROPIUM-ALBUTEROL 0.5-2.5 (3) MG/3ML IN SOLN: RESPIRATORY_TRACT | Qty: 3

## 2022-05-06 MED FILL — METRONIDAZOLE 500 MG/100ML IV SOLN: 500 MG/100ML | INTRAVENOUS | Qty: 100

## 2022-05-06 NOTE — Progress Notes (Signed)
Pulmonary Progress Note    Subjective:   Daily Progress Note: 05/06/2022 5:39 PM    CC:  71 years old patient with cirrhosis and ascites, acute kidney failure possible ATN, chronic C. difficile colitis, severe malnutrition with worsening status of nausea vomiting with no abdominal pain on nonimprovement renal function  HPI: 9/6  No new complaints  9/7  Patient is frail looking but appears to be progressing slowly  Review of Systems  Pertinent items are noted in HPI.    Objective:     BP (!) 121/56   Pulse 88   Temp 98.6 F (37 C) (Oral)   Resp 18   Ht 1.803 m (5' 10.98")   Wt 74.5 kg (164 lb 5.3 oz)   SpO2 100%   BMI 22.93 kg/m         Temp (24hrs), Avg:98.4 F (36.9 C), Min:97.9 F (36.6 C), Max:98.9 F (37.2 C)      No intake/output data recorded.  09/05 1901 - 09/07 0700  In: 1854.2 [P.O.:1000]  Out: 800 [Urine:400]    BP (!) 121/56   Pulse 88   Temp 98.6 F (37 C) (Oral)   Resp 18   Ht 1.803 m (5' 10.98")   Wt 74.5 kg (164 lb 5.3 oz)   SpO2 100%   BMI 22.93 kg/m   General appearance: alert, appears stated age, cooperative, and toxic  Head: Normocephalic, without obvious abnormality, atraumatic  Nose: Nares normal. Septum midline. Mucosa normal. No drainage or sinus tenderness.  Neck: no adenopathy, no carotid bruit, no JVD, supple, symmetrical, trachea midline, and thyroid not enlarged, symmetric, no tenderness/mass/nodules  Lungs: clear to auscultation bilaterally  Heart: regular rate and rhythm, S1, S2 normal, no murmur, click, rub or gallop  Abdomen: soft, non-tender; bowel sounds normal; no masses,  no organomegaly  Extremities: extremities normal, atraumatic, no cyanosis or edema  Pulses: 2+ and symmetric  Skin: Skin color, texture, turgor normal. No rashes or lesions        Data Review    Recent Results (from the past 24 hour(s))   POCT Glucose    Collection Time: 05/05/22  8:42 PM   Result Value Ref Range    POC Glucose 107 (H) 65 - 100 mg/dL    Performed by: Loreen Freud    CBC with  Auto Differential    Collection Time: 05/06/22  4:00 AM   Result Value Ref Range    WBC 5.0 4.1 - 11.1 K/uL    RBC 2.52 (L) 4.10 - 5.70 M/uL    Hemoglobin 7.2 (L) 12.1 - 17.0 g/dL    Hematocrit 08.6 (L) 36.6 - 50.3 %    MCV 92.5 80.0 - 99.0 FL    MCH 28.6 26.0 - 34.0 PG    MCHC 30.9 30.0 - 36.5 g/dL    RDW 57.8 (H) 46.9 - 14.5 %    Platelets 83 (L) 150 - 400 K/uL    MPV 9.7 8.9 - 12.9 FL    Nucleated RBCs 0.0 0.0 PER 100 WBC    nRBC 0.00 0.00 - 0.01 K/uL    Neutrophils % 68 32 - 75 %    Lymphocytes % 22 12 - 49 %    Monocytes % 9 5 - 13 %    Eosinophils % 1 0 - 7 %    Basophils % 0 0 - 1 %    Immature Granulocytes 0 0 - 0.5 %    Neutrophils Absolute 3.3 1.8 - 8.0 K/UL  Lymphocytes Absolute 1.1 0.8 - 3.5 K/UL    Monocytes Absolute 0.5 0.0 - 1.0 K/UL    Eosinophils Absolute 0.1 0.0 - 0.4 K/UL    Basophils Absolute 0.0 0.0 - 0.1 K/UL    Absolute Immature Granulocyte 0.0 0.00 - 0.04 K/UL    Differential Type Smear Scanned      Platelet Comment DECREASED PLATELETS     RBC Comment Anisocytosis  1+        RBC Comment Microcytosis  1+           Current Facility-Administered Medications   Medication Dose Route Frequency    potassium chloride 20 mEq, sodium bicarbonate 50 mEq in dextrose 5 % and 0.45 % NaCl 1,000 mL infusion   IntraVENous Continuous    ipratropium 0.5 mg-albuterol 2.5 mg (DUONEB) nebulizer solution 1 Dose  1 Dose Inhalation Q4H PRN    budesonide-formoterol (SYMBICORT) 160-4.5 MCG/ACT inhaler 2 puff  2 puff Inhalation BID RT    sodium bicarbonate tablet 650 mg  650 mg Oral TID    potassium chloride (KLOR-CON M) extended release tablet 20 mEq  20 mEq Oral BID    0.9 % sodium chloride infusion   IntraVENous PRN    cholestyramine light packet 4 g  4 g Oral TID    Fidaxomicin (DIFICID) tablet 200 mg  200 mg Oral BID    calcium carbonate (TUMS) chewable tablet 500 mg  500 mg Oral TID    traZODone (DESYREL) tablet 50 mg  50 mg Oral Nightly    acidophilus/citrus pectin 1 tablet  1 tablet Oral Daily    sodium  chloride flush 0.9 % injection 5-40 mL  5-40 mL IntraVENous 2 times per day    sodium chloride flush 0.9 % injection 5-40 mL  5-40 mL IntraVENous PRN    0.9 % sodium chloride infusion   IntraVENous PRN    ondansetron (ZOFRAN-ODT) disintegrating tablet 4 mg  4 mg Oral Q8H PRN    Or    ondansetron (ZOFRAN) injection 4 mg  4 mg IntraVENous Q6H PRN    acetaminophen (TYLENOL) tablet 650 mg  650 mg Oral Q6H PRN    Or    acetaminophen (TYLENOL) suppository 650 mg  650 mg Rectal Q6H PRN    metronidazole (FLAGYL) 500 mg in 0.9% NaCl 100 mL IVPB premix  500 mg IntraVENous Q8H     CT of the abdomen  IMPRESSION:  1.  Diffuse colonic wall thickening predominantly the ascending and transverse  colon. Findings may represent the patient's C. diff colitis. Is no evidence of  toxic megacolon or obstruction.     2.  Moderate to large volume ascites. Cirrhosis. Splenomegaly.     3.  Moderate bilateral pleural effusions    Assessment/Plan:     Principal Problem:    Acute kidney injury (AKI) with acute tubular necrosis (ATN) (HCC)  Active Problems:    Acute kidney injury (HCC)    Clostridioides difficile infection    Colitis    Hypokalemia  Resolved Problems:    * No resolved hospital problems. *  *D/W Nephrology  Sepsis severe transfer to ICU for possible pressors  Volume depletion continue with IV fluids  Malnutrition Severe  Severe C. difficile colitis    Albumin trasfusion  Total time spent with patient: 40 minutes.

## 2022-05-06 NOTE — Wound Image (Signed)
Wound Care Note:      Wound Care into see patient because of excoriation to sacrum    Patient resting in bed, patient had large loose BM, patient on contact precautions for C.Diff at this time. Incontinence care provided with gown, underpad and sheets changed.     IAD to sacrum, perineum and scrotum from frequent diarrhea. Fissure present to gluteal cleft, all areas have blanchable erythema. Believe that Desitin is coming off to easily, WCN applied and when turned most of it came off on underpad. Will change for Triad to be applied BID and PRN for incontinence care, bottle of Triad to be left in patient room if more is needed contact WCN. Sacral foam pad applied over coccxy for protection from friction and increased pressure reduction.       Wound Care Documentation:  Wound Buttocks MASD, Rash (Active)   Wound Image   05/06/22 0907   Wound Etiology Other 05/06/22 0907   Dressing Status New dressing applied 05/06/22 0907   Wound Cleansed Cleansed with saline 05/06/22 0907   Dressing/Treatment Zinc paste 05/06/22 0907   Wound Assessment Pink/red;Denuded 05/06/22 9629   Drainage Amount Scant (moist but unmeasurable) 05/06/22 0907   Drainage Description Serosanguinous 05/06/22 0907   Odor None 05/06/22 0907   Peri-wound Assessment Excoriated;Blanchable erythema 05/06/22 0907   Number of days:              Skin Care & Pressure Relief Recommendations:  Minimize layers of linen  Pads under patient to optimize support surface and microclimate  Turn/Reposition approximately every 2 hours  Pillow/Wedge  Manage Incontinence  Promote Continence; Skin Protective lotion to buttocks and sacrum daily and as needed with incontinence care  Offload heels with pillows or offloading boots        Transition of Care: Plan to follow weekly while admitted to hospital

## 2022-05-06 NOTE — Progress Notes (Signed)
Infectious Disease Progress Note          Subjective:   Stable, remains severely deconditioned. Off pressor support, stools remain loose, flexiseal and foley discontinued   Objective:   Physical Exam:     BP 115/69   Pulse 94   Temp 98 F (36.7 C) (Oral)   Resp 14   Ht 5' 10.98" (1.803 m)   Wt 164 lb 5.3 oz (74.5 kg)   SpO2 100%   BMI 22.93 kg/m    O2 Device: None (Room air)    Temp (24hrs), Avg:98.2 F (36.8 C), Min:97.6 F (36.4 C), Max:98.9 F (37.2 C)    No intake/output data recorded.   09/05 1901 - 09/07 0700  In: 1854.2 [P.O.:1000]  Out: 800 [Urine:400]    General: NAD, AAO x 4  HEENT: PERLA, Moist mucosa   Lungs: decreased at the bases, no wheeze/rhonchi   Heart: S1S2+, RRR, no murmur  Abdo: Soft, NT, mildly distended, NT, +BS   GU: indwelling foley cath   Exts: +2 pitting edema, + pulses b/l   Skin: Excoriation in the perineum, no sacral ulcer     Data Review:       Recent Days:    Recent Labs     05/04/22  0548 05/05/22  0310 05/06/22  0400   WBC 4.6 4.9 5.0   HGB 6.5* 7.6* 7.2*   HCT 22.1* 24.4* 23.3*   PLT 91* 96* 83*       Recent Labs     05/04/22  0548 05/05/22  0310   BUN 43* 43*  45*   CREATININE 5.62* 5.84*  5.73*         Lab Results   Component Value Date/Time    CRP 2.55 05/05/2022 03:10 AM        Microbiology     Results       Procedure Component Value Units Date/Time    Culture, Urine [[id number]] Collected: 05/01/22 1545    Order Status: Completed Specimen: Urine Updated: 05/04/22 1225     Special Requests No Special Requests        Colony count 20,000        Colony count colonies/ml        Culture       Mixed urogenital flora isolated          Clostridium Difficile Toxin/Antigen [[id number]]  (Abnormal) Collected: 2022/05/08 1215    Order Status: Completed Specimen: Stool Updated: 08-May-2022 1511     [facility] Antigen Positive        C difficile Toxin, EIA Positive        Comment: Results verified, phoned to and read back by Joellyn Rued RN AT 1510 ON [id number] [facility]         C Diff Toxin Interpretation       Positive for toxigenic C. difficile                   Diagnostic   CT abdo/pel 09/06  1. Diffuse colonic wall thickening predominantly the ascending and transverse  colon. Findings may represent the patient's C. diff colitis. Is no evidence of  toxic megacolon or obstruction.     2.  Moderate to large volume ascites. Cirrhosis. Splenomegaly.     3.  Moderate bilateral pleural effusions.    Assessment/Plan     Severe C.diff infection w  Diffuse colitis on CT abdo/pel (08/27)         Ongoing loose stools, Diffuse colonic wall  thickening predominantly the ascending and transverse  Colon, w/o evidence of toxic megacolon on CT (09/06)         Remains afebrile w a normal WBC on routine labs        On day #8/10 of Fidoxamicin, and day #12/14 of IV metronidazole       Continue probiotics and Questran. Routine labs in AM     2. Mucocutaneous candidiasis involving perineum, continue topical antifungal therapy       Monitor for bacteria superinfection, difficult to keep involved areas dry w stool/urine incontinence     3. Liver cirrhosis w Moderate to large volume ascites on CT     4. AKI, Cr staying stable at 5, foley discontinued      Vol overload, b/l effusions and ascites          5. Severe deconditioning failure to thrive, encourage oral in take     Jene Every, MD    05/06/2022

## 2022-05-06 NOTE — Progress Notes (Signed)
[facility]     Renal Daily Progress Note:     Subjective:  Not improved, BP variable, Hgb trending down, no overt bleeding. Not eating, still with copious diarrhea.  Awake and alert. Has diffuse purpura with thrombocytopenia.  Poor appetite but no nausea or vomiting. Mild abdominal discomfort.   Diarrhea still present, rectal tube reinserted yesterday but significant around tube spillage. Stool output not quantified.  Rectal and gluteal pain, skin appears cellulitic. No shortness of breath, no nausea or vomiting. Serum creatinine trending up, no labs resulted yet.  IVF was not active yesterday, reordered early evening. There is concern for inadequate nutrition by oral route.    Review of Systems  Pertinent items are noted in HPI.    Objective:     BP 115/69   Pulse 94   Temp 98 F (36.7 C) (Oral)   Resp 14   Ht 1.803 m (5' 10.98")   Wt 74.5 kg (164 lb 5.3 oz)   SpO2 100%   BMI 22.93 kg/m   Temp (24hrs), Avg:98.2 F (36.8 C), Min:97.6 F (36.4 C), Max:98.9 F (37.2 C)        Intake/Output Summary (Last 24 hours) at 05/06/2022 1128  Last data filed at 05/05/2022 1757  Gross per 24 hour   Intake 1000 ml   Output 100 ml   Net 900 ml     Current Facility-Administered Medications   Medication Dose Route Frequency    potassium chloride 20 mEq, sodium bicarbonate 50 mEq in dextrose 5 % and 0.45 % NaCl 1,000 mL infusion   IntraVENous Continuous    ipratropium 0.5 mg-albuterol 2.5 mg (DUONEB) nebulizer solution 1 Dose  1 Dose Inhalation Q4H PRN    budesonide-formoterol (SYMBICORT) 160-4.5 MCG/ACT inhaler 2 puff  2 puff Inhalation BID RT    sodium bicarbonate tablet 650 mg  650 mg Oral TID    potassium chloride (KLOR-CON M) extended release tablet 20 mEq  20 mEq Oral BID    0.9 % sodium chloride infusion   IntraVENous PRN    cholestyramine light packet 4 g  4 g Oral TID    Fidaxomicin (DIFICID) tablet 200 mg  200 mg Oral BID    calcium carbonate (TUMS) chewable tablet 500 mg  500 mg Oral TID    traZODone  (DESYREL) tablet 50 mg  50 mg Oral Nightly    acidophilus/citrus pectin 1 tablet  1 tablet Oral Daily    sodium chloride flush 0.9 % injection 5-40 mL  5-40 mL IntraVENous 2 times per day    sodium chloride flush 0.9 % injection 5-40 mL  5-40 mL IntraVENous PRN    0.9 % sodium chloride infusion   IntraVENous PRN    ondansetron (ZOFRAN-ODT) disintegrating tablet 4 mg  4 mg Oral Q8H PRN    Or    ondansetron (ZOFRAN) injection 4 mg  4 mg IntraVENous Q6H PRN    acetaminophen (TYLENOL) tablet 650 mg  650 mg Oral Q6H PRN    Or    acetaminophen (TYLENOL) suppository 650 mg  650 mg Rectal Q6H PRN    metronidazole (FLAGYL) 500 mg in 0.9% NaCl 100 mL IVPB premix  500 mg IntraVENous Q8H       Physical Exam: appears stated age, cooperative, alert, more interactive and no distress  Head: Normocephalic, without obvious abnormality, atraumatic  Eyes:  anicteric  Neck: no adenopathy, no JVD, supple, symmetrical, trachea midline, and thyroid not enlarged, symmetric,  Lungs: clear to auscultation bilaterally  Heart:  regular rate and rhythm, and no S3 or S4  Abdomen: soft, non-tender; bowel sounds normal; no masses,  no organomegaly- no guarding  Extremities:  no edema  SKIN: diffuse purpura, cellulitic appearing skin along perineum  Neurologic: Grossly normal -alert and oriented x3  GU: foley with dark urine       Data Review:     LABS:  Recent Labs     05/05/22  0310 05/04/22  0548 05/03/22  0553   NA 141  141 142 143   K 3.4*  3.4* 3.1* 3.4*   CL 115*  115* 115* 115*   CO2 18*  18* 20* 21   BUN 43*  45* 43* 42*   CREATININE 5.84*  5.73* 5.62* 5.56*   CALCIUM 7.4*  7.3* 7.5* 7.2*   LABALBU 2.4*  2.4* 2.2* 1.6*   PHOS 3.4 4.0 3.7   MG 1.8 1.8 1.9     Recent Labs     05/06/22  0400 05/05/22  0310 05/04/22  0548   WBC 5.0 4.9 4.6   HGB 7.2* 7.6* 6.5*   HCT 23.3* 24.4* 22.1*   PLT 83* 96* 91*   Urine 05/03/22 16:59  Protein, Urine, Random: 215 (H)    227 (H)  Creatinine, Ur: 153.00    150.00  PROTEIN/CREAT RATIO URINE RAN:  1.4  SODIUM, RANDOM URINE: 15  FeNa <1%    Radiology  CT ABDOMEN PELVIS WO CONTRAST Additional Contrast? None  05-05-22  IMPRESSION:  1.  Diffuse colonic wall thickening predominantly the ascending and transverse  colon. Findings may represent the patient's C. diff colitis. Is no evidence of  toxic megacolon or obstruction.     2.  Moderate to large volume ascites. Cirrhosis. Splenomegaly.     3.  Moderate bilateral pleural effusions.    Result Date: 04/25/2022  1. Diffuse colitis. 2. 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 3. New mild to moderate right pleural effusion with bibasilar atelectasis. New 14 mm left lower lobe nodular lung opacity. Recommend follow-up in 3 months. 4. Refer to above findings for complete details.     XR CHEST PORTABLE  Result Date: 04/25/2022  Small right pleural effusion with underlying atelectasis.     CXR 05-04-22  IMPRESSION:  Small right pleural effusion with underlying atelectasis     Assessment:   Renal Specific Problems  AKI- renal function worsening, has foley. Repeat urine studies are still consistent with volume depletion.  The fractional excretion of sodium remains less than 1%., initial urine studies suggested volume depletion. -Still active diarrhea with poor po intake.  The current FeNa suggest renal recovery is imminent once volume is restored  Pan colitis  Anemia  Hypokalemia-  Hypoalbumin  Cirrhosis with ascites  Metabolic acidosis-resolved  C. difficile with significant diarrhea > 10 days- not resolving  Thrombocytopenia with development of purpura  Anorexia         Plan:     Obtain/ Order: labs/cultures/radiology/procedures:  renal panel in a.m.      Therapeutic:    Increase nutritional support  Transfuse to hemoglobin greater than 7  Oral KCL- increase to 20 meq bid x  2 more days  Cont IVF to 0.45NS  with 20 KCL/l at 100 ml/hr  IV albumin -prn  Consider TPN  F/u  magnesium and phosphorus  Oral NaHCO3, increase to tid    Strict Is and Os  Replace foley to prevent further  skin damage    No indication for RRT at this  time    Avoid nonsteroidal anti-inflammatory agents  Trazodone for sleep  Follow-up renal function daily  Have heme see    Discussed with patient, nurse, Dr. Norberta Keens

## 2022-05-07 LAB — CBC WITH AUTO DIFFERENTIAL
Absolute Immature Granulocyte: 0.1 10*3/uL — ABNORMAL HIGH (ref 0.00–0.04)
Basophils %: 0 % (ref 0–1)
Basophils Absolute: 0 10*3/uL (ref 0.0–0.1)
Eosinophils %: 2 % (ref 0–7)
Eosinophils Absolute: 0.1 10*3/uL (ref 0.0–0.4)
Hematocrit: 23.4 % — ABNORMAL LOW (ref 36.6–50.3)
Hemoglobin: 7.3 g/dL — ABNORMAL LOW (ref 12.1–17.0)
Immature Granulocytes: 1 % — ABNORMAL HIGH (ref 0–0.5)
Lymphocytes %: 19 % (ref 12–49)
Lymphocytes Absolute: 1 10*3/uL (ref 0.8–3.5)
MCH: 28.2 PG (ref 26.0–34.0)
MCHC: 31.2 g/dL (ref 30.0–36.5)
MCV: 90.3 FL (ref 80.0–99.0)
MPV: 9.9 FL (ref 8.9–12.9)
Monocytes %: 8 % (ref 5–13)
Monocytes Absolute: 0.4 10*3/uL (ref 0.0–1.0)
Neutrophils %: 70 % (ref 32–75)
Neutrophils Absolute: 3.9 10*3/uL (ref 1.8–8.0)
Nucleated RBCs: 0 PER 100 WBC
Platelets: 95 10*3/uL — ABNORMAL LOW (ref 150–400)
RBC: 2.59 M/uL — ABNORMAL LOW (ref 4.10–5.70)
RDW: 24.8 % — ABNORMAL HIGH (ref 11.5–14.5)
WBC: 5.5 10*3/uL (ref 4.1–11.1)
nRBC: 0 10*3/uL (ref 0.00–0.01)

## 2022-05-07 LAB — RENAL FUNCTION PANEL
Albumin: 2.4 g/dL — ABNORMAL LOW (ref 3.5–5.0)
Anion Gap: 7 mmol/L (ref 5–15)
BUN: 53 mg/dL — ABNORMAL HIGH (ref 6–20)
Bun/Cre Ratio: 8 — ABNORMAL LOW (ref 12–20)
CO2: 19 mmol/L — ABNORMAL LOW (ref 21–32)
Calcium: 7.5 mg/dL — ABNORMAL LOW (ref 8.5–10.1)
Chloride: 117 mmol/L — ABNORMAL HIGH (ref 97–108)
Creatinine: 6.24 mg/dL — ABNORMAL HIGH (ref 0.70–1.30)
Est, Glom Filt Rate: 9 mL/min/{1.73_m2} — ABNORMAL LOW (ref >60)
Glucose: 117 mg/dL — ABNORMAL HIGH (ref 65–100)
Phosphorus: 3 mg/dL (ref 2.6–4.7)
Potassium: 3.7 mmol/L (ref 3.5–5.1)
Sodium: 143 mmol/L (ref 136–145)

## 2022-05-07 LAB — MAGNESIUM: Magnesium: 1.9 mg/dL (ref 1.6–2.4)

## 2022-05-07 LAB — POCT GLUCOSE: POC Glucose: 125 mg/dL — ABNORMAL HIGH (ref 65–100)

## 2022-05-07 MED ORDER — SODIUM BICARBONATE 8.4 % IV SOLN
8.4 % | INTRAVENOUS | Status: AC
Start: 2022-05-07 — End: 2022-05-10
  Administered 2022-05-07 – 2022-05-09 (×4): via INTRAVENOUS

## 2022-05-07 MED ORDER — LOPERAMIDE HCL 2 MG PO CAPS
2 | Freq: Three times a day (TID) | ORAL | Status: DC
Start: 2022-05-07 — End: 2022-05-09
  Administered 2022-05-07 – 2022-05-09 (×6): 2 mg via ORAL

## 2022-05-07 MED ORDER — ALBUMIN HUMAN 25 % IV SOLN
25 % | Freq: Four times a day (QID) | INTRAVENOUS | Status: AC
Start: 2022-05-07 — End: 2022-05-07
  Administered 2022-05-07 (×2): 25 g via INTRAVENOUS

## 2022-05-07 MED FILL — METRONIDAZOLE 500 MG/100ML IV SOLN: 500 MG/100ML | INTRAVENOUS | Qty: 100

## 2022-05-07 MED FILL — BUDESONIDE-FORMOTEROL FUMARATE 160-4.5 MCG/ACT IN AERO: RESPIRATORY_TRACT | Qty: 0.2

## 2022-05-07 MED FILL — DIFICID 200 MG PO TABS: 200 MG | ORAL | Qty: 1

## 2022-05-07 MED FILL — POTASSIUM CHLORIDE CRYS ER 20 MEQ PO TBCR: 20 MEQ | ORAL | Qty: 1

## 2022-05-07 MED FILL — ACIDOPHILUS/CITRUS PECTIN PO TABS: ORAL | Qty: 1

## 2022-05-07 MED FILL — ALBUMIN HUMAN 25 % IV SOLN: 25 % | INTRAVENOUS | Qty: 100

## 2022-05-07 MED FILL — SODIUM BICARBONATE 650 MG PO TABS: 650 MG | ORAL | Qty: 1

## 2022-05-07 MED FILL — DEXTROSE-NACL 5-0.45 % IV SOLN: INTRAVENOUS | Qty: 1000

## 2022-05-07 MED FILL — TRAZODONE HCL 50 MG PO TABS: 50 MG | ORAL | Qty: 1

## 2022-05-07 MED FILL — CAL-GEST ANTACID 500 MG PO CHEW: 500 MG | ORAL | Qty: 1

## 2022-05-07 MED FILL — ONDANSETRON HCL 4 MG/2ML IJ SOLN: 4 MG/2ML | INTRAMUSCULAR | Qty: 2

## 2022-05-07 MED FILL — LOPERAMIDE HCL 2 MG PO CAPS: 2 MG | ORAL | Qty: 1

## 2022-05-07 MED FILL — CHOLESTYRAMINE LIGHT 4 G PO PACK: 4 g | ORAL | Qty: 1

## 2022-05-07 NOTE — Other (Signed)
Comprehensive Nutrition Assessment    Type and Reason for Visit:  Reassess (goal)    Nutrition Recommendations/Plan:   Continue current diet  Ensure 3x/day, banatrol 2x/day   If pt continues to meet <60% of needs via PO, consider EN. If enteral access placed and within goals of care, rec: Jevity 1.5 at 39m/hr, advancing 113mq4hrs to goal rate of 5547mr, H2O flushes 150m61mhrs, +2pkts banatrol/day - provides 1980kcal (+80kcal banatrol, 2060kcal total, 100%), 84g protein (95%), 1901mL85m (100%) - continue PO diet as able  Monitor and record PO intakes, supplement acceptance, and Bms in I/Os     Malnutrition Assessment:  Malnutrition Status:  Severe malnutrition (04/08/2022 1257)    Context:  Acute Illness     Findings of the 6 clinical characteristics of malnutrition:  Energy Intake:  50% or less of estimated energy requirements for 5 or more days (decreased intake x1-2 wks, minimal intakes PTA per pt report)  Weight Loss:  Greater than 7.5% over 3 months (~22% wt loss x 3 mo. per EMR; nutritionally significant wt loss per pt report x2-3 mo.)     Body Fat Loss:  Mild body fat loss (per observational NFPE r/t contact iso.) Orbital   Muscle Mass Loss:  Moderate muscle mass loss Temples (temporalis), Clavicles (pectoralis & deltoids) (per observational NFPE r/t contact iso.)  Fluid Accumulation:  No significant fluid accumulation    Grip Strength:  Not Performed    Nutrition Assessment:    (P) Presents to ED from [facility] for evaluation of generalized weakness, decreased PO intake, and abdominal discomfort ongoing x 1 week. No substantial PO intake x 1 week, multiple episodes of loose stools, and lack of appetite per pt family per H&P. ~22% wt loss x 3 months per EMR, nutritionally significant if documented wt's are accurate (no method recorded/stated wt). Pt noted decreasing wt trends x 2-3 months (246# - 158#, 33% wt loss - nutritionally significant if accurate report). Pt reported decreased  appetite/intakes x 1-2 wks. Limited PO intake PTA (few eggs/day per pt report). Pt on full liquid diet (coffee for B). Pt amenable to starting ONS to better meet EER and to promote wound healing. Plan to monitor intakes and adjust ONS rec'd prn. Rec'd to advance diet when medically appropriate. (8/30) Chart reviewed for interim/consult for unintended wt loss. Diet advanced to ETC. Pt with poor intakes (0% x 2 meals 8/29, 0% B per pt report). Suspect pt's decreased intake may be r/t food preferences - pt noted disliking meals. Food preferences obtained and recorded. Pt enjoying Ensure - increase to 2x/day. Continue Juven 2x/d to promote wound healing. Pt amenable to snacks between meals to encourage PO intake. Continue to monitor intakes, consider nutrition support if congruent w/ GOC if pt continues to meet <60% of EER. (9/1) Pt endorsed ~50% intake at B, 100% intake of snacks (chicken salad), and 100% intake of ONS. Pt highly motivated to improve PO intakes. Food preferences obtained and recorded. Pt noted difficulty eating foods on tray that require both hands/arms (difficulty moving L arm) - RN notified, will provide assistance prn. Continue ETC diet, increase frequency of ONS per pt request. Intakes increasing, continue to consider nutrition support if meeting <60% of EER if congruent w/ goals of care. (9/6) Transferred to ICU, hypotensive. Rectal tube reinserted, ongoing diarrhea. Intake of trays poor, <25%- however, good ONS acceptance, >76%. Plan to continue current ONS, snacks, add banatrol r/t c-diff diarrhea. (9/8) Ongoing poor intake, good ONS acceptance.  Consider EN if PO intakes unimproved - rec's above. (Noted "TPN" rec in MD note- pt inappropriate in setting of functional gut). Labs: Na 143, K 3.7, BUN 53, Creat 6.24, Gluc 117. Meds: acidophilus/citrus pectin, calcium carbonate, cholestyramine light, KCl, sodium bicarbonate, ondansetron    Nutrition Related Findings:    (P) NFPE deferred r/t contact  iso - moderate wasting per visual assessment. No n/v, +diarrhea. 1+ generalized edema, improving. BM 9/7. Wound Type: Multiple, Pressure Injury, Stage II, Surgical Incision (Stage 2 PI (sacral), Incision (L thigh))       Current Nutrition Intake & Therapies:    Average Meal Intake: (P) 1-25%  Average Supplements Intake: (P) 76-100%  ADULT ORAL NUTRITION SUPPLEMENT; Breakfast, Dinner; Wound Healing Oral Supplement  ADULT ORAL NUTRITION SUPPLEMENT; Lunch, Dinner; Standard High Calorie/High Protein Oral Supplement  ADULT DIET; Easy to Chew; Likes: eggs/coffee (B), Sandwiches w/ lettuce, tomato, mayo (L), Hamburger steak (D)  ADULT ORAL NUTRITION SUPPLEMENT; Breakfast; Diabetic Oral Supplement  ADULT ORAL NUTRITION SUPPLEMENT; Breakfast, Dinner; Other Oral Supplement; BANATROL    Anthropometric Measures:  Height: 180.3 cm (5' 10.98")  Ideal Body Weight (IBW): 172 lbs (78 kg)    Current Body Weight: (P) 164 lb 3.9 oz (74.5 kg), (P) 95.5 % IBW. Weight Source: (P) Bed Scale  Current BMI (kg/m2): (P) 22.9  Weight Adjustment For: No Adjustment  BMI Categories: (P) Underweight (BMI less than 22) age over 72    Estimated Daily Nutrient Needs:  Energy Requirements Based On: Kcal/kg  Weight Used for Energy Requirements: Current  Energy (kcal/day): 1863 - 2235 kcals/day (25 - 30 kcals/kg)  Weight Used for Protein Requirements: Current  Protein (g/day): 89 - 104 g/day (1.2 - 1.4 - wound)  Method Used for Fluid Requirements: 1 ml/kcal  Fluid (ml/day): 1863 - 2235 ml/day (29m/kcal)    Nutrition Diagnosis:   Severe malnutrition related to inadequate protein-energy intake as evidenced by Criteria as identified in malnutrition assessment    Nutrition Interventions:   Food and/or Nutrient Delivery: (P) Continue Current Diet, Continue Oral Nutrition Supplement  Nutrition Education/Counseling: (P) No recommendation at this time  Coordination of Nutrition Care: (P) Continue to monitor while inpatient, Feeding Assistance/Environment  Change  Plan of Care discussed with: pt, RN    Goals:  Previous Goal Met: (P) Progressing toward Goal(s)  Goals: (P) PO intake 75% or greater, by next RD assessment    Nutrition Monitoring and Evaluation:   Behavioral-Environmental Outcomes: (P) None Identified  Food/Nutrient Intake Outcomes: (P) Food and Nutrient Intake, Supplement Intake  Physical Signs/Symptoms Outcomes: (P) Meal Time Behavior, Weight    Discharge Planning:    (P) Continue Oral Nutrition Supplement     JLavonna Monarch RD  Contact: [phone]

## 2022-05-07 NOTE — Progress Notes (Signed)
Infectious Disease Progress Note          Subjective:   Remains stable still experiencing loose stools unclear if all from C.diff. Remains afebrile w a normal WBC on routine labs, decreased UO. Has been refusing questran   Objective:   Physical Exam:     BP (!) 109/56   Pulse (!) 101   Temp 97.7 F (36.5 C) (Oral)   Resp 19   Ht 5' 10.98" (1.803 m)   Wt 164 lb 5.3 oz (74.5 kg)   SpO2 98%   BMI 22.93 kg/m    O2 Device: None (Room air)    Temp (24hrs), Avg:98.2 F (36.8 C), Min:97.7 F (36.5 C), Max:98.6 F (37 C)    No intake/output data recorded.   09/06 1901 - 09/08 0700  In: -   Out: 300 [Urine:300]    General: NAD, AAO x 4  HEENT: PERLA, Moist mucosa   Lungs: decreased at the bases, no wheeze/rhonchi   Heart: S1S2+, RRR, no murmur  Abdo: Soft, NT, distended, NT, +BS   GU: indwelling foley cath   Exts: +2 pitting edema, + pulses b/l   Skin: Excoriation in the perineum, no sacral ulcer     Data Review:       Recent Days:    Recent Labs     05/05/22  0310 05/06/22  0400 05/07/22  0430   WBC 4.9 5.0 5.5   HGB 7.6* 7.2* 7.3*   HCT 24.4* 23.3* 23.4*   PLT 96* 83* 95*       Recent Labs     05/05/22  0310 05/07/22  0430   BUN 43*  45* 53*   CREATININE 5.84*  5.73* 6.24*         Lab Results   Component Value Date/Time    CRP 2.55 05/05/2022 03:10 AM        Microbiology     Results       Procedure Component Value Units Date/Time    Culture, Urine [[id number]] Collected: 05/01/22 1545    Order Status: Completed Specimen: Urine Updated: 05/04/22 1225     Special Requests No Special Requests        Colony count 20,000        Colony count colonies/ml        Culture       Mixed urogenital flora isolated          Clostridium Difficile Toxin/Antigen [[id number]]  (Abnormal) Collected: 05/17/2022 1215    Order Status: Completed Specimen: Stool Updated: 05-17-22 1511     GDH Antigen Positive        C difficile Toxin, EIA Positive        Comment: Results verified, phoned to and read back by Joellyn Rued RN AT 1510 ON [id number] [facility]        C Diff Toxin Interpretation       Positive for toxigenic C. difficile                   Diagnostic   CT abdo/pel 09/06  1. Diffuse colonic wall thickening predominantly the ascending and transverse  colon. Findings may represent the patient's C. diff colitis. Is no evidence of  toxic megacolon or obstruction.     2.  Moderate to large volume ascites. Cirrhosis. Splenomegaly.     3.  Moderate bilateral pleural effusions.    Assessment/Plan     Severe C.diff infection. Diffuse colonic wall thickening predominantly the ascending and transverse  Colon, w/o evidence of toxic megacolon on CT (09/06)         Abdomen is distended otherwise benign exam. Hemodynamically stable, afebrile, WBC WNL        Loose stools likely from colonic inflammation, unclear if all from C.diff, hospital policy does not allow test of cure        On day #9/10 of Fidoxamicin, and day #13/14 of IV metronidazole       Continue probiotics and Questran. Routine labs in AM     2. Mucocutaneous candidiasis involving perineum,      Continue topical antifungal therapy     3. Liver cirrhosis w Moderate to large volume ascites on CT       Pt endorses a h/o alcoholism, will check viral hepatitis panel     4. AKI, foley reinserted, oliguric, Cr rising          Jene Every, MD    05/07/2022

## 2022-05-07 NOTE — Care Coordination-Inpatient (Signed)
Recent clinicals sent to accepting [facility].  Berkley Harvey will expire on 11 Aug 23.       S.O'Neal, MSW  [phone]

## 2022-05-07 NOTE — Progress Notes (Signed)
[facility]     Renal Daily Progress Note:     Subjective:  Not improved, BP variable, Hgb trending down, no overt bleeding. Not eating, still with copious diarrhea.  Awake and alert. Has diffuse purpura with thrombocytopenia.  Poor appetite but no nausea or vomiting. Mild abdominal discomfort.   Diarrhea still present, rectal tube reinserted yesterday but significant around tube spillage. Stool output not quantified.  Rectal and gluteal pain, skin appears cellulitic. No shortness of breath, no nausea or vomiting. Serum creatinine trending up, no labs resulted yet.  IVF was not active yesterday, reordered early evening. There is concern for inadequate nutrition by oral route.    F/U - AKI - > 05/07/22    His course was reviewed with Dr. Norberta Keens and ICU Nursing.  Diarrhea is still an issue.    Review of Systems  Pertinent items are noted in HPI.    Objective:     BP (!) 109/56   Pulse (!) 101   Temp 97.7 F (36.5 C) (Oral)   Resp 19   Ht 1.803 m (5' 10.98")   Wt 74.5 kg (164 lb 5.3 oz)   SpO2 98%   BMI 22.93 kg/m   Temp (24hrs), Avg:97.7 F (36.5 C), Min:97.7 F (36.5 C), Max:97.7 F (36.5 C)        Intake/Output Summary (Last 24 hours) at 05/07/2022 1629  Last data filed at 05/06/2022 2300  Gross per 24 hour   Intake --   Output 300 ml   Net -300 ml       Current Facility-Administered Medications   Medication Dose Route Frequency    loperamide (IMODIUM) capsule 2 mg  2 mg Oral TID    ipratropium 0.5 mg-albuterol 2.5 mg (DUONEB) nebulizer solution 1 Dose  1 Dose Inhalation Q4H PRN    budesonide-formoterol (SYMBICORT) 160-4.5 MCG/ACT inhaler 2 puff  2 puff Inhalation BID RT    sodium bicarbonate tablet 650 mg  650 mg Oral TID    potassium chloride (KLOR-CON M) extended release tablet 20 mEq  20 mEq Oral BID    0.9 % sodium chloride infusion   IntraVENous PRN    cholestyramine light packet 4 g  4 g Oral TID    Fidaxomicin (DIFICID) tablet 200 mg  200 mg Oral BID    calcium carbonate (TUMS) chewable tablet  500 mg  500 mg Oral TID    traZODone (DESYREL) tablet 50 mg  50 mg Oral Nightly    acidophilus/citrus pectin 1 tablet  1 tablet Oral Daily    sodium chloride flush 0.9 % injection 5-40 mL  5-40 mL IntraVENous 2 times per day    sodium chloride flush 0.9 % injection 5-40 mL  5-40 mL IntraVENous PRN    0.9 % sodium chloride infusion   IntraVENous PRN    ondansetron (ZOFRAN-ODT) disintegrating tablet 4 mg  4 mg Oral Q8H PRN    Or    ondansetron (ZOFRAN) injection 4 mg  4 mg IntraVENous Q6H PRN    acetaminophen (TYLENOL) tablet 650 mg  650 mg Oral Q6H PRN    Or    acetaminophen (TYLENOL) suppository 650 mg  650 mg Rectal Q6H PRN    metronidazole (FLAGYL) 500 mg in 0.9% NaCl 100 mL IVPB premix  500 mg IntraVENous Q8H       Physical Exam:     Seen in ICU - Bed 275    Appears stated age, cooperative, alert, more interactive and no distress  Head: Normocephalic.    Eyes:  anicteric    Lungs: clear to auscultation, no wheezes, no rales    Heart:  S 1 , S 2 , No S3 gallop     Abdomen: Not distended    Extremities:  Minimal leg oedema    Neurologic:  Alert and oriented     GU: foley        Data Review:     LABS:  Recent Labs     05/07/22  0430 05/05/22  0310 05/04/22  0548   NA 143 141  141 142   K 3.7 3.4*  3.4* 3.1*   CL 117* 115*  115* 115*   CO2 19* 18*  18* 20*   BUN 53* 43*  45* 43*   CREATININE 6.24* 5.84*  5.73* 5.62*   CALCIUM 7.5* 7.4*  7.3* 7.5*   LABALBU 2.4* 2.4*  2.4* 2.2*   PHOS 3.0 3.4 4.0   MG 1.9 1.8 1.8       Recent Labs     05/07/22  0430 05/06/22  0400 05/05/22  0310   WBC 5.5 5.0 4.9   HGB 7.3* 7.2* 7.6*   HCT 23.4* 23.3* 24.4*   PLT 95* 83* 96*     Urine 05/03/22 16:59  Protein, Urine, Random: 215 (H)    227 (H)  Creatinine, Ur: 153.00    150.00  PROTEIN/CREAT RATIO URINE RAN: 1.4  SODIUM, RANDOM URINE: 15  FeNa <1%    Radiology  CT ABDOMEN PELVIS WO CONTRAST Additional Contrast? None  05-05-22  IMPRESSION:  1.  Diffuse colonic wall thickening predominantly the ascending and transverse  colon.  Findings may represent the patient's C. diff colitis. Is no evidence of  toxic megacolon or obstruction.     2.  Moderate to large volume ascites. Cirrhosis. Splenomegaly.     3.  Moderate bilateral pleural effusions.    Result Date: 04/25/2022  1. Diffuse colitis. 2. 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 3. New mild to moderate right pleural effusion with bibasilar atelectasis. New 14 mm left lower lobe nodular lung opacity. Recommend follow-up in 3 months. 4. Refer to above findings for complete details.     XR CHEST PORTABLE  Result Date: 04/25/2022  Small right pleural effusion with underlying atelectasis.     CXR 05-04-22  IMPRESSION:  Small right pleural effusion with underlying atelectasis     Assessment:   Renal Specific Problems  AKI- renal function worsening, has foley. Repeat urine studies are still consistent with volume depletion.  The fractional excretion of sodium remains less than 1%., initial urine studies suggested volume depletion. -Still active diarrhea with poor po intake.  The current FeNa suggest renal recovery is imminent once volume is restored  Pan colitis  Anemia  Hypokalemia-  Hypoalbumin  Cirrhosis with ascites  Metabolic acidosis-resolved  C. difficile with significant diarrhea > 10 days- not resolving  Thrombocytopenia with development of purpura  Anorexia     He may be hitting a plateau.    Plan:     Obtain/ Order: labs/cultures/radiology/procedures:  renal panel in a.m.      Therapeutic:    Increase nutritional support  Transfuse to hemoglobin greater than 7  Oral KCL- increase to 20 meq bid x  2 more days  Continue IVF to 0.45NS  with 20 KCL/l at 100 ml/hr  IV albumin -prn  Consider TPN  F/u  magnesium and phosphorus  Oral NaHCO3, increase to tid    Strict  Is and Os    Replace foley to prevent further skin damage    No indication for RRT at this time    Avoid nonsteroidal anti-inflammatory agents    Trazodone for sleep    Follow-up renal function daily    Have heme see    Discussed  with ICU Nursing and  Dr. Norberta Keens (ID)      Coral Spikes, MD  [phone]

## 2022-05-07 NOTE — Progress Notes (Signed)
Pulmonary Progress Note    Subjective:   Daily Progress Note: 05/07/2022 11:38 AM    CC:  71 years old patient with cirrhosis and ascites, acute kidney failure possible ATN, chronic C. difficile colitis, severe malnutrition with worsening status of nausea vomiting with no abdominal pain on nonimprovement renal function  HPI: 9/6  No new complaints  9/7  Patient is frail looking but appears to be progressing slowly  9/8  Dietitian notes reviewed  For possible transfer to telemetry  Review of Systems  Pertinent items are noted in HPI.    Objective:     BP (!) 109/56   Pulse (!) 101   Temp 97.7 F (36.5 C) (Oral)   Resp 19   Ht 1.803 m (5' 10.98")   Wt 74.5 kg (164 lb 5.3 oz)   SpO2 98%   BMI 22.93 kg/m         Temp (24hrs), Avg:98.2 F (36.8 C), Min:97.7 F (36.5 C), Max:98.6 F (37 C)      No intake/output data recorded.  09/06 1901 - 09/08 0700  In: -   Out: 300 [Urine:300]    BP (!) 109/56   Pulse (!) 101   Temp 97.7 F (36.5 C) (Oral)   Resp 19   Ht 1.803 m (5' 10.98")   Wt 74.5 kg (164 lb 5.3 oz)   SpO2 98%   BMI 22.93 kg/m   General appearance: alert, appears stated age, cooperative, and toxic  Head: Normocephalic, without obvious abnormality, atraumatic  Nose: Nares normal. Septum midline. Mucosa normal. No drainage or sinus tenderness.  Neck: no adenopathy, no carotid bruit, no JVD, supple, symmetrical, trachea midline, and thyroid not enlarged, symmetric, no tenderness/mass/nodules  Lungs: clear to auscultation bilaterally  Heart: regular rate and rhythm, S1, S2 normal, no murmur, click, rub or gallop  Abdomen: soft, non-tender; bowel sounds normal; no masses,  no organomegaly  Extremities: extremities normal, atraumatic, no cyanosis or edema  Pulses: 2+ and symmetric  Skin: Skin color, texture, turgor normal. No rashes or lesions        Data Review    Recent Results (from the past 24 hour(s))   POCT Glucose    Collection Time: 05/06/22  8:56 PM   Result Value Ref Range    POC Glucose 125  (H) 65 - 100 mg/dL    Performed by: Loreen Freud    Magnesium    Collection Time: 05/07/22  4:30 AM   Result Value Ref Range    Magnesium 1.9 1.6 - 2.4 mg/dL   Renal Function Panel    Collection Time: 05/07/22  4:30 AM   Result Value Ref Range    Sodium 143 136 - 145 mmol/L    Potassium 3.7 3.5 - 5.1 mmol/L    Chloride 117 (H) 97 - 108 mmol/L    CO2 19 (L) 21 - 32 mmol/L    Anion Gap 7 5 - 15 mmol/L    Glucose 117 (H) 65 - 100 mg/dL    BUN 53 (H) 6 - 20 mg/dL    Creatinine 1.61 (H) 0.70 - 1.30 mg/dL    Bun/Cre Ratio 8 (L) 12 - 20      Est, Glom Filt Rate 9 (L) >60 ml/min/1.31m2    Calcium 7.5 (L) 8.5 - 10.1 mg/dL    Phosphorus 3.0 2.6 - 4.7 mg/dL    Albumin 2.4 (L) 3.5 - 5.0 g/dL   CBC with Auto Differential    Collection Time: 05/07/22  4:30  AM   Result Value Ref Range    WBC 5.5 4.1 - 11.1 K/uL    RBC 2.59 (L) 4.10 - 5.70 M/uL    Hemoglobin 7.3 (L) 12.1 - 17.0 g/dL    Hematocrit 98.9 (L) 36.6 - 50.3 %    MCV 90.3 80.0 - 99.0 FL    MCH 28.2 26.0 - 34.0 PG    MCHC 31.2 30.0 - 36.5 g/dL    RDW 21.1 (H) 94.1 - 14.5 %    Platelets 95 (L) 150 - 400 K/uL    MPV 9.9 8.9 - 12.9 FL    Nucleated RBCs 0.0 0.0 PER 100 WBC    nRBC 0.00 0.00 - 0.01 K/uL    Neutrophils % 70 32 - 75 %    Lymphocytes % 19 12 - 49 %    Monocytes % 8 5 - 13 %    Eosinophils % 2 0 - 7 %    Basophils % 0 0 - 1 %    Immature Granulocytes 1 (H) 0 - 0.5 %    Neutrophils Absolute 3.9 1.8 - 8.0 K/UL    Lymphocytes Absolute 1.0 0.8 - 3.5 K/UL    Monocytes Absolute 0.4 0.0 - 1.0 K/UL    Eosinophils Absolute 0.1 0.0 - 0.4 K/UL    Basophils Absolute 0.0 0.0 - 0.1 K/UL    Absolute Immature Granulocyte 0.1 (H) 0.00 - 0.04 K/UL    Differential Type Smear Scanned      RBC Comment Teardrop cells  1+        RBC Comment Anisocytosis  3+           Current Facility-Administered Medications   Medication Dose Route Frequency    ipratropium 0.5 mg-albuterol 2.5 mg (DUONEB) nebulizer solution 1 Dose  1 Dose Inhalation Q4H PRN    budesonide-formoterol (SYMBICORT) 160-4.5  MCG/ACT inhaler 2 puff  2 puff Inhalation BID RT    sodium bicarbonate tablet 650 mg  650 mg Oral TID    potassium chloride (KLOR-CON M) extended release tablet 20 mEq  20 mEq Oral BID    0.9 % sodium chloride infusion   IntraVENous PRN    cholestyramine light packet 4 g  4 g Oral TID    Fidaxomicin (DIFICID) tablet 200 mg  200 mg Oral BID    calcium carbonate (TUMS) chewable tablet 500 mg  500 mg Oral TID    traZODone (DESYREL) tablet 50 mg  50 mg Oral Nightly    acidophilus/citrus pectin 1 tablet  1 tablet Oral Daily    sodium chloride flush 0.9 % injection 5-40 mL  5-40 mL IntraVENous 2 times per day    sodium chloride flush 0.9 % injection 5-40 mL  5-40 mL IntraVENous PRN    0.9 % sodium chloride infusion   IntraVENous PRN    ondansetron (ZOFRAN-ODT) disintegrating tablet 4 mg  4 mg Oral Q8H PRN    Or    ondansetron (ZOFRAN) injection 4 mg  4 mg IntraVENous Q6H PRN    acetaminophen (TYLENOL) tablet 650 mg  650 mg Oral Q6H PRN    Or    acetaminophen (TYLENOL) suppository 650 mg  650 mg Rectal Q6H PRN    metronidazole (FLAGYL) 500 mg in 0.9% NaCl 100 mL IVPB premix  500 mg IntraVENous Q8H     CT of the abdomen  IMPRESSION:  1.  Diffuse colonic wall thickening predominantly the ascending and transverse  colon. Findings may represent the patient's C. diff colitis.  Is no evidence of  toxic megacolon or obstruction.     2.  Moderate to large volume ascites. Cirrhosis. Splenomegaly.     3.  Moderate bilateral pleural effusions    Assessment/Plan:     Principal Problem:    Acute kidney injury (AKI) with acute tubular necrosis (ATN) (HCC)  Active Problems:    Acute kidney injury (HCC)    Clostridioides difficile infection    Colitis    Hypokalemia  Resolved Problems:    * No resolved hospital problems. *  *D/W Nephrology  Sepsis severe transfer to ICU for possible pressors  Volume depletion continue with IV fluids  Malnutrition Severe  Severe C. difficile colitis    Albumin trasfusion  Total time spent with patient: 40  minutes.

## 2022-05-08 LAB — BASIC METABOLIC PANEL
Anion Gap: 9 mmol/L (ref 5–15)
BUN: 59 mg/dL — ABNORMAL HIGH (ref 6–20)
Bun/Cre Ratio: 9 — ABNORMAL LOW (ref 12–20)
CO2: 16 mmol/L — ABNORMAL LOW (ref 21–32)
Calcium: 7.4 mg/dL — ABNORMAL LOW (ref 8.5–10.1)
Chloride: 121 mmol/L — ABNORMAL HIGH (ref 97–108)
Creatinine: 6.33 mg/dL — ABNORMAL HIGH (ref 0.70–1.30)
Est, Glom Filt Rate: 9 mL/min/{1.73_m2} — ABNORMAL LOW (ref >60)
Glucose: 85 mg/dL (ref 65–100)
Potassium: 3.8 mmol/L (ref 3.5–5.1)
Sodium: 146 mmol/L — ABNORMAL HIGH (ref 136–145)

## 2022-05-08 MED FILL — TRAZODONE HCL 50 MG PO TABS: 50 MG | ORAL | Qty: 1

## 2022-05-08 MED FILL — METRONIDAZOLE 500 MG/100ML IV SOLN: 500 MG/100ML | INTRAVENOUS | Qty: 100

## 2022-05-08 MED FILL — CAL-GEST ANTACID 500 MG PO CHEW: 500 MG | ORAL | Qty: 1

## 2022-05-08 MED FILL — SODIUM BICARBONATE 650 MG PO TABS: 650 MG | ORAL | Qty: 1

## 2022-05-08 MED FILL — POTASSIUM CHLORIDE CRYS ER 20 MEQ PO TBCR: 20 MEQ | ORAL | Qty: 1

## 2022-05-08 MED FILL — LOPERAMIDE HCL 2 MG PO CAPS: 2 MG | ORAL | Qty: 1

## 2022-05-08 MED FILL — DIFICID 200 MG PO TABS: 200 MG | ORAL | Qty: 1

## 2022-05-08 MED FILL — CHOLESTYRAMINE LIGHT 4 G PO PACK: 4 g | ORAL | Qty: 1

## 2022-05-08 MED FILL — DEXTROSE-NACL 5-0.45 % IV SOLN: INTRAVENOUS | Qty: 1000

## 2022-05-08 MED FILL — ACIDOPHILUS/CITRUS PECTIN PO TABS: ORAL | Qty: 1

## 2022-05-08 NOTE — Progress Notes (Signed)
General Daily Progress Note      Patient Name:   Carlos Petersen       Date of Birth:   1951-03-25       Age:  71 y.o.      Admit Date: 04/25/2022      Subjective:     Patient doing well has some loose stool             Objective:     Vitals:    05/08/22 0915   BP:    Pulse:    Resp:    Temp:    SpO2: 100%        No results found for this or any previous visit (from the past 24 hour(s)).  @LABCOMPINFO @        Review of Systems    Constitutional: Negative for chills and fever.   HENT: Negative.    Eyes: Negative.    Respiratory: Negative.    Cardiovascular: Negative.    Gastrointestinal: Negative for abdominal pain and nausea.   Skin: Negative.    Neurological: Negative.        Physical Exam:      Constitutional: pt is oriented to person, place, and time.   HENT:   Head: Normocephalic and atraumatic.   Eyes: Pupils are equal, round, and reactive to light. EOM are normal.   Cardiovascular: Normal rate, regular rhythm and normal heart sounds.   Pulmonary/Chest: Breath sounds normal. No wheezes. No rales.   Exhibits no tenderness.   Abdominal: Soft. Bowel sounds are normal. There is no abdominal tenderness. There is no rebound and no guarding.   Musculoskeletal: Normal range of motion.   Neurological: pt is alert and oriented to person, place, and time.     XR CHEST PORTABLE   Final Result      No acute process.         CT ABDOMEN PELVIS WO CONTRAST Additional Contrast? None   Final Result   1.  Diffuse colonic wall thickening predominantly the ascending and transverse   colon. Findings may represent the patient's C. diff colitis. Is no evidence of   toxic megacolon or obstruction.      2.  Moderate to large volume ascites. Cirrhosis. Splenomegaly.      3.  Moderate bilateral pleural effusions.      XR CHEST PORTABLE   Final Result      Small right pleural effusion with underlying atelectasis.         XR ABDOMEN (2 VIEWS)   Final Result      1.  No evidence of ileus or obstruction.   2.  Small right and trace left  pleural effusions.          RETROPERITONEAL COMPLETE   Final Result   1.  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   2.  Layering debris in the bladder, correlate with urinalysis.   3.  Splenomegaly.   4.  Small volume ascites and right pleural effusion.          XR CHEST PORTABLE   Final Result      Small right pleural effusion with underlying atelectasis.         CT ABDOMEN PELVIS WO CONTRAST Additional Contrast? None   Final Result      1. Diffuse colitis.   2. 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   3. New mild to moderate right pleural effusion with bibasilar atelectasis. New   14 mm left lower lobe  nodular lung opacity. Recommend follow-up in 3 months.   4. Refer to above findings for complete details.            No results found for this or any previous visit (from the past 24 hour(s)).    Results       Procedure Component Value Units Date/Time    Culture, Urine [[id number]] Collected: 05/01/22 1545    Order Status: Completed Specimen: Urine Updated: 05/04/22 1225     Special Requests No Special Requests        Colony count 20,000        Colony count colonies/ml        Culture       Mixed urogenital flora isolated          Clostridium Difficile Toxin/Antigen [[id number]]  (Abnormal) Collected: 05-24-2022 1215    Order Status: Completed Specimen: Stool Updated: 24-May-2022 1511     [facility] Antigen Positive        C difficile Toxin, EIA Positive        Comment: Results verified, phoned to and read back by Joellyn Rued RN AT 1510 ON [id number] [facility]        C Diff Toxin Interpretation       Positive for toxigenic C. difficile                   No results found.       Labs:     Recent Labs     05/06/22  0400 05/07/22  0430   WBC 5.0 5.5   HGB 7.2* 7.3*   HCT 23.3* 23.4*   PLT 83* 95*     Recent Labs     05/07/22  0430   NA 143   K 3.7   CL 117*   CO2 19*   BUN 53*   CREATININE 6.24*   GLUCOSE 117*   CALCIUM 7.5*   MG 1.9   PHOS 3.0     Recent Labs     05/07/22  0430   LABALBU 2.4*     No results for input(s):  "INR", "PROTIME", "APTT" in the last 72 hours.   No results for input(s): "IRON", "TIBC", "FERRITIN" in the last 72 hours.    Invalid input(s): "PSAT"   No results found for: "VITAMINB12", "FOLATE", "RBCF"   No results for input(s): "PH", "PCO2", "PO2" in the last 72 hours.  No results for input(s): "CKMB", "CKMBINDEX", "TROPHS" in the last 72 hours.    Invalid input(s): "TOTALCK", "TROPININ"  Lab Results   Component Value Date/Time    CHOL 134 01/07/2021 11:00 AM    TRIG 199 01/07/2021 11:00 AM    HDL 34 01/07/2021 11:00 AM    LDLCALC 67 01/07/2021 11:00 AM    VLDL 33 01/07/2021 11:00 AM     Lab Results   Component Value Date/Time    POCGLU 125 05/06/2022 08:56 PM    POCGLU 107 05/05/2022 08:42 PM    POCGLU 112 05/04/2022 08:43 PM    POCGLU 133 05/04/2022 12:30 PM    POCGLU 129 01/31/2022 12:33 PM     Lab Results   Component Value Date/Time    COLORU Amber May 24, 2022 06:10 AM    COLORU Amber 24-May-2022 06:10 AM    CLARITYU Clear 12/17/2021 09:31 AM    GLUCOSEU Negative May 24, 2022 06:10 AM    GLUCOSEU Negative 05/24/2022 06:10 AM    BILIRUBINUR Negative 2022-05-24 06:10 AM    BILIRUBINUR Negative 2022-05-24 06:10 AM  BILIRUBINUR Negative 12/17/2021 09:31 AM    KETUA 5 May 05, 2022 06:10 AM    KETUA Negative 05-05-2022 06:10 AM    SPECGRAV 1.014 05-05-22 06:10 AM    SPECGRAV 1.014 2022/05/05 06:10 AM    BLOODU Large 2022/05/05 06:10 AM    BLOODU Large 05-05-22 06:10 AM    PHUR 6.0 12/17/2021 09:31 AM    PROTEINU 100 05/05/2022 06:10 AM    PROTEINU 100 05/05/2022 06:10 AM    NITRU Negative May 05, 2022 06:10 AM    NITRU Negative 05-May-2022 06:10 AM    LEUKOCYTESUR Negative May 05, 2022 06:10 AM    LEUKOCYTESUR Negative 2022-05-05 06:10 AM         Assessment:     Severe C. difficile colitis infection  Mucocutaneous candidiasis  Cirrhosis of liver  Acute kidney injury  Chronic anemia  Thrombocytopenia      Plan:     Symbicort 2 puff twice daily  Questran 4 g 3 times a day  Imodium 2 mg 3 times a day  Flagyl 500 every 8  hours  Sodium bicarb 650 mg 3 times a day  Trazodone 50 mg at night    Patient and potassium sodium bicarb dextrose drip    Follow-up with nephrology and infectious disease        Current Facility-Administered Medications:     loperamide (IMODIUM) capsule 2 mg, 2 mg, Oral, TID, Gwendolin Tangham V, MD, 2 mg at 05/07/22 2119    potassium chloride 20 mEq, sodium bicarbonate 50 mEq in dextrose 5 % and 0.45 % NaCl 1,000 mL infusion, , IntraVENous, Continuous, Coral Spikes, MD, Last Rate: 100 mL/hr at 05/07/22 1759, New Bag at 05/07/22 1759    ipratropium 0.5 mg-albuterol 2.5 mg (DUONEB) nebulizer solution 1 Dose, 1 Dose, Inhalation, Q4H PRN, Blake Divine, MD, 1 Dose at 05/05/22 2119    budesonide-formoterol (SYMBICORT) 160-4.5 MCG/ACT inhaler 2 puff, 2 puff, Inhalation, BID RT, Blake Divine, MD, 2 puff at 05/08/22 0915    sodium bicarbonate tablet 650 mg, 650 mg, Oral, TID, Rudene Re, MD, 650 mg at 05/07/22 2121    0.9 % sodium chloride infusion, , IntraVENous, PRN, Blake Divine, MD    cholestyramine light packet 4 g, 4 g, Oral, TID, Blake Divine, MD, 4 g at 05/07/22 2118    Fidaxomicin (DIFICID) tablet 200 mg, 200 mg, Oral, BID, Gwendolin Tangham V, MD, 200 mg at 05/07/22 2127    calcium carbonate (TUMS) chewable tablet 500 mg, 500 mg, Oral, TID, Rudene Re, MD, 500 mg at 05/07/22 2118    traZODone (DESYREL) tablet 50 mg, 50 mg, Oral, Nightly, Blake Divine, MD, 50 mg at 05/07/22 2118    acidophilus/citrus pectin 1 tablet, 1 tablet, Oral, Daily, Blake Divine, MD, 1 tablet at 05/07/22 1001    sodium chloride flush 0.9 % injection 5-40 mL, 5-40 mL, IntraVENous, 2 times per day, Blake Divine, MD, 10 mL at 05/07/22 2120    sodium chloride flush 0.9 % injection 5-40 mL, 5-40 mL, IntraVENous, PRN, Blake Divine, MD    0.9 % sodium chloride infusion, , IntraVENous, PRN, Blake Divine, MD    ondansetron (ZOFRAN-ODT) disintegrating tablet 4 mg, 4 mg, Oral, Q8H PRN, 4 mg at 05/02/22 1211  **OR** ondansetron (ZOFRAN) injection 4 mg, 4 mg, IntraVENous, Q6H PRN, Blake Divine, MD, 4 mg at 05/07/22 0603    acetaminophen (TYLENOL) tablet 650 mg, 650 mg, Oral, Q6H PRN, 650 mg at 05/04/22 2155 **  OR** acetaminophen (TYLENOL) suppository 650 mg, 650 mg, Rectal, Q6H PRN, Blake Divine, MD    metronidazole (FLAGYL) 500 mg in 0.9% NaCl 100 mL IVPB premix, 500 mg, IntraVENous, Q8H, Blake Divine, MD, Stopped at 05/08/22 0400

## 2022-05-08 NOTE — Progress Notes (Signed)
Vascular Access Team Consult Note: received consult overnight for 2nd PIV site from primary care nurse.     Upon review, client has RAC PIV working without difficulty per nursing report and Avatar review. Per LexiComp, Flagyl and IV fluids with additives are compatible and may be run by piggyback or Y-site infusion.     Called and notified Fayrene Fearing, primary care dayshift nurse.     Please re-enter IP PICC Team Consult in Epic for any further vascular access needs.

## 2022-05-08 NOTE — Progress Notes (Signed)
[facility]     Renal Daily Progress Note:     Subjective:  Not improved, BP variable, Hgb trending down, no overt bleeding. Not eating, still with copious diarrhea.  Awake and alert. Has diffuse purpura with thrombocytopenia.  Poor appetite but no nausea or vomiting. Mild abdominal discomfort.   Diarrhea still present, rectal tube reinserted yesterday but significant around tube spillage. Stool output not quantified.  Rectal and gluteal pain, skin appears cellulitic. No shortness of breath, no nausea or vomiting. Serum creatinine trending up, no labs resulted yet.  IVF was not active yesterday, reordered early evening. There is concern for inadequate nutrition by oral route.    F/U - AKI - > 05/07/22    His course was reviewed with Dr. Norberta Keens and ICU Nursing.  Diarrhea is still an issue.    05/08/22 Out of ICU. Diarrhea continues. Colon thickening compatible with C dif colitis but without toxic megacolon.   Poor appetite.    Review of Systems  Pertinent items are noted in HPI.    Objective:     BP (!) 105/58   Pulse 86   Temp 98.4 F (36.9 C) (Axillary)   Resp 20   Ht 1.803 m (5' 10.98")   Wt 74.5 kg (164 lb 5.3 oz)   SpO2 100%   BMI 22.93 kg/m   Temp (24hrs), Avg:98 F (36.7 C), Min:97.5 F (36.4 C), Max:98.4 F (36.9 C)        Intake/Output Summary (Last 24 hours) at 05/08/2022 1258  Last data filed at 05/08/2022 0500  Gross per 24 hour   Intake 360 ml   Output 150 ml   Net 210 ml       Current Facility-Administered Medications   Medication Dose Route Frequency    loperamide (IMODIUM) capsule 2 mg  2 mg Oral TID    potassium chloride 20 mEq, sodium bicarbonate 50 mEq in dextrose 5 % and 0.45 % NaCl 1,000 mL infusion   IntraVENous Continuous    ipratropium 0.5 mg-albuterol 2.5 mg (DUONEB) nebulizer solution 1 Dose  1 Dose Inhalation Q4H PRN    budesonide-formoterol (SYMBICORT) 160-4.5 MCG/ACT inhaler 2 puff  2 puff Inhalation BID RT    sodium bicarbonate tablet 650 mg  650 mg Oral TID    0.9 % sodium  chloride infusion   IntraVENous PRN    cholestyramine light packet 4 g  4 g Oral TID    Fidaxomicin (DIFICID) tablet 200 mg  200 mg Oral BID    calcium carbonate (TUMS) chewable tablet 500 mg  500 mg Oral TID    traZODone (DESYREL) tablet 50 mg  50 mg Oral Nightly    acidophilus/citrus pectin 1 tablet  1 tablet Oral Daily    sodium chloride flush 0.9 % injection 5-40 mL  5-40 mL IntraVENous 2 times per day    sodium chloride flush 0.9 % injection 5-40 mL  5-40 mL IntraVENous PRN    0.9 % sodium chloride infusion   IntraVENous PRN    ondansetron (ZOFRAN-ODT) disintegrating tablet 4 mg  4 mg Oral Q8H PRN    Or    ondansetron (ZOFRAN) injection 4 mg  4 mg IntraVENous Q6H PRN    acetaminophen (TYLENOL) tablet 650 mg  650 mg Oral Q6H PRN    Or    acetaminophen (TYLENOL) suppository 650 mg  650 mg Rectal Q6H PRN    metronidazole (FLAGYL) 500 mg in 0.9% NaCl 100 mL IVPB premix  500 mg IntraVENous Q8H  Physical Exam:         Appears stated age, cooperative, alert, more interactive and no distress    Head: Normocephalic.    Eyes:  anicteric    Lungs: diminished to auscultation, no wheezes, no rales    Heart:  S 1 , S 2 , No S3 gallop     Abdomen: Distended    Extremities:  Minimal leg edema    Neurologic:  Alert and oriented     GU: foley        Data Review:     LABS:  Recent Labs     05/08/22  0556 05/07/22  0430 05/05/22  0310 05/04/22  0548   NA 146* 143 141  141 142   K 3.8 3.7 3.4*  3.4* 3.1*   CL 121* 117* 115*  115* 115*   CO2 16* 19* 18*  18* 20*   BUN 59* 53* 43*  45* 43*   CREATININE 6.33* 6.24* 5.84*  5.73* 5.62*   CALCIUM 7.4* 7.5* 7.4*  7.3* 7.5*   LABALBU  --  2.4* 2.4*  2.4* 2.2*   PHOS  --  3.0 3.4 4.0   MG  --  1.9 1.8 1.8       Recent Labs     05/07/22  0430 05/06/22  0400 05/05/22  0310   WBC 5.5 5.0 4.9   HGB 7.3* 7.2* 7.6*   HCT 23.4* 23.3* 24.4*   PLT 95* 83* 96*     Urine 05/03/22 16:59  Protein, Urine, Random: 215 (H)    227 (H)  Creatinine, Ur: 153.00    150.00  PROTEIN/CREAT RATIO  URINE RAN: 1.4  SODIUM, RANDOM URINE: 15  FeNa <1%    Radiology  CT ABDOMEN PELVIS WO CONTRAST Additional Contrast? None  05-05-22  IMPRESSION:  1.  Diffuse colonic wall thickening predominantly the ascending and transverse  colon. Findings may represent the patient's C. diff colitis. Is no evidence of  toxic megacolon or obstruction.     2.  Moderate to large volume ascites. Cirrhosis. Splenomegaly.     3.  Moderate bilateral pleural effusions.    Result Date: 04/25/2022  1. Diffuse colitis. 2. 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 3. New mild to moderate right pleural effusion with bibasilar atelectasis. New 14 mm left lower lobe nodular lung opacity. Recommend follow-up in 3 months. 4. Refer to above findings for complete details.     XR CHEST PORTABLE  Result Date: 04/25/2022  Small right pleural effusion with underlying atelectasis.     CXR 05-04-22  IMPRESSION:  Small right pleural effusion with underlying atelectasis     Assessment:   Renal Specific Problems  AKI- renal function worsening, has foley. Repeat urine studies are still consistent with volume depletion.  The fractional excretion of sodium remains less than 1%., initial urine studies suggested volume depletion. -Still active diarrhea with poor po intake.  The current FeNa suggest renal recovery is imminent once volume is restored  Pan colitis  Anemia  Hypokalemia-  Hypoalbumin  Cirrhosis with ascites  Metabolic acidosis-worsening again  C. difficile with significant diarrhea > 10 days- not resolving  Thrombocytopenia with development of purpura  Anorexia     GFR is gradually woesening    Plan:     Obtain/ Order: labs/cultures/radiology/procedures:  renal panel in a.m.      Therapeutic:    Increase nutritional support  Transfuse to hemoglobin greater than 7  Oral KCL- increase to 20 meq bid  x  2 more days  Continue IVF to 0.45NS  with 20 KCL/l at 100 ml/hr  IV albumin -prn  Consider TPN  F/u  magnesium and phosphorus  Oral NaHCO3, increase to  tid    Strict Is and Os    Replace foley to prevent further skin damage    No indication for RRT at this time    Avoid nonsteroidal anti-inflammatory agents    Trazodone for sleep    Follow-up renal function daily    Have heme see

## 2022-05-08 NOTE — Progress Notes (Signed)
Infectious Disease Progress Note          Subjective:   Stable, ongoing episodes of loose stools, FMS to be replaced, no acute events since last seen, appetite remains poor   Objective:   Physical Exam:     BP (!) 105/58   Pulse 86   Temp 98.4 F (36.9 C) (Axillary)   Resp 20   Ht 5' 10.98" (1.803 m)   Wt 164 lb 5.3 oz (74.5 kg)   SpO2 100%   BMI 22.93 kg/m    O2 Device: None (Room air)    Temp (24hrs), Avg:98 F (36.7 C), Min:97.5 F (36.4 C), Max:98.4 F (36.9 C)    No intake/output data recorded.   09/07 1901 - 09/09 0700  In: 360 [P.O.:360]  Out: 250 [Urine:250]    General: NAD, AAO x 4  HEENT: PERLA, Moist mucosa   Lungs: decreased at the bases, no wheeze/rhonchi   Heart: S1S2+, RRR, no murmur  Abdo: Soft, NT, distended, NT, +BS   GU: indwelling foley cath   Exts: +2 pitting edema, + pulses b/l   Skin: Excoriation in the perineum, no sacral ulcer     Data Review:       Recent Days:    Recent Labs     05/06/22  0400 05/07/22  0430   WBC 5.0 5.5   HGB 7.2* 7.3*   HCT 23.3* 23.4*   PLT 83* 95*       Recent Labs     05/07/22  0430   BUN 53*   CREATININE 6.24*         Lab Results   Component Value Date/Time    CRP 2.55 05/05/2022 03:10 AM        Microbiology     Results       Procedure Component Value Units Date/Time    Culture, Urine [[id number]] Collected: 05/01/22 1545    Order Status: Completed Specimen: Urine Updated: 05/04/22 1225     Special Requests No Special Requests        Colony count 20,000        Colony count colonies/ml        Culture       Mixed urogenital flora isolated          Clostridium Difficile Toxin/Antigen [[id number]]  (Abnormal) Collected: May 18, 2022 1215    Order Status: Completed Specimen: Stool Updated: 2022-05-18 1511     [facility] Antigen Positive        C difficile Toxin, EIA Positive        Comment: Results verified, phoned to and read back by Joellyn Rued RN AT 1510 ON [id number] [facility]        C Diff Toxin Interpretation       Positive for toxigenic C. difficile                    Diagnostic   CT abdo/pel 09/06  1. Diffuse colonic wall thickening predominantly the ascending and transverse  colon. Findings may represent the patient's C. diff colitis. Is no evidence of  toxic megacolon or obstruction.     2.  Moderate to large volume ascites. Cirrhosis. Splenomegaly.     3.  Moderate bilateral pleural effusions.    Assessment/Plan     Severe C.diff infection. Diffuse colonic wall thickening predominantly the ascending and transverse  Colon, w/o evidence of toxic megacolon on CT (09/06)         Ongoing episodes of loose stool, FMS  to be replaced         Remains afebrile w a normal WBC on routine labs        On day #10 of Fidoxamicin, and day #14/14 of IV metronidazole       Plan on tapering Fidoxamicin, if okay w IC will like to repeat stool for C.diff toxin     2. Mucocutaneous candidiasis involving perineum, continue topical antifungal therapy       Difficult to keep perineum dry due to stool incontinence     3. Liver cirrhosis w Moderate to large volume ascites on CT, symptomatic      4. AKI, oliguric, foley in place, rising Cr on most recent labs          Jene Every, MD    05/08/2022

## 2022-05-08 NOTE — Plan of Care (Signed)
Problem: Discharge Planning  Goal: Discharge to home or other facility with appropriate resources  Outcome: Progressing  Flowsheets (Taken 05/08/2022 2115)  Discharge to home or other facility with appropriate resources:   Identify barriers to discharge with patient and caregiver   Arrange for needed discharge resources and transportation as appropriate     Problem: Skin/Tissue Integrity  Goal: Absence of new skin breakdown  Description: 1.  Monitor for areas of redness and/or skin breakdown  2.  Assess vascular access sites hourly  3.  Every 4-6 hours minimum:  Change oxygen saturation probe site  4.  Every 4-6 hours:  If on nasal continuous positive airway pressure, respiratory therapy assess nares and determine need for appliance change or resting period.  Outcome: Progressing     Problem: Safety - Adult  Goal: Free from fall injury  Outcome: Progressing  Flowsheets (Taken 05/08/2022 2115)  Free From Fall Injury: Instruct family/caregiver on patient safety     Problem: Skin/Tissue Integrity - Adult  Goal: Skin integrity remains intact  Outcome: Progressing  Flowsheets  Taken 05/08/2022 2115 by Azucena Fallen, RN  Skin Integrity Remains Intact: Monitor for areas of redness and/or skin breakdown  Taken 05/08/2022 0930 by Odis Luster, RN  Skin Integrity Remains Intact:   Monitor for areas of redness and/or skin breakdown   Assess vascular access sites hourly   Every 4-6 hours minimum: Change oxygen saturation probe site  Goal: Incisions, wounds, or drain sites healing without S/S of infection  Outcome: Progressing  Flowsheets (Taken 05/08/2022 2115)  Incisions, Wounds, or Drain Sites Healing Without Sign and Symptoms of Infection: TWICE DAILY: Assess and document skin integrity  Goal: Oral mucous membranes remain intact  Outcome: Progressing  Flowsheets  Taken 05/08/2022 2115 by Azucena Fallen, RN  Oral Mucous Membranes Remain Intact:   Assess oral mucosa and hygiene practices   Implement preventative oral hygiene  regimen   Implement oral medicated treatments as ordered  Taken 05/08/2022 0930 by Odis Luster, RN  Oral Mucous Membranes Remain Intact: Implement oral medicated treatments as ordered     Problem: Musculoskeletal - Adult  Goal: Return mobility to safest level of function  Outcome: Progressing  Flowsheets  Taken 05/08/2022 2115 by Azucena Fallen, RN  Return Mobility to Safest Level of Function:   Assess patient stability and activity tolerance for standing, transferring and ambulating with or without assistive devices   Assist with transfers and ambulation using safe patient handling equipment as needed   Ensure adequate protection for wounds/incisions during mobilization  Taken 05/08/2022 0930 by Odis Luster, RN  Return Mobility to Safest Level of Function:   Assess patient stability and activity tolerance for standing, transferring and ambulating with or without assistive devices   Assist with transfers and ambulation using safe patient handling equipment as needed   Apply continuous passive motion per provider or physical therapy orders to increase flexion toward goal   Obtain physical therapy/occupational therapy consults as needed   Ensure adequate protection for wounds/incisions during mobilization   Instruct patient/family in ordered activity level  Goal: Maintain proper alignment of affected body part  Outcome: Progressing  Flowsheets (Taken 05/08/2022 2115)  Maintain proper alignment of affected body part:   Support and protect limb and body alignment per provider's orders   Instruct and reinforce with patient and family use of appropriate assistive device and precautions (e.g. spinal or hip dislocation precautions)  Goal: Return ADL status to a safe level of function  Outcome: Progressing  Flowsheets (Taken 05/08/2022 0930 by Odis Luster, RN)  Return ADL Status to a Safe Level of Function: Administer medication as ordered     Problem: Metabolic/Fluid and Electrolytes - Adult  Goal:  Electrolytes maintained within normal limits  Outcome: Progressing  Flowsheets (Taken 05/08/2022 2115)  Electrolytes maintained within normal limits:   Monitor labs and assess patient for signs and symptoms of electrolyte imbalances   Administer electrolyte replacement as ordered   Monitor response to electrolyte replacements, including repeat lab results as appropriate  Goal: Hemodynamic stability and optimal renal function maintained  Outcome: Progressing  Flowsheets (Taken 05/08/2022 2115)  Hemodynamic stability and optimal renal function maintained:   Monitor labs and assess for signs and symptoms of volume excess or deficit   Monitor intake, output and patient weight   Monitor urine specific gravity, serum osmolarity and serum sodium as indicated or ordered   Monitor response to interventions for patient's volume status, including labs, urine output, blood pressure (other measures as available)   Encourage oral intake as appropriate  Goal: Glucose maintained within prescribed range  Outcome: Progressing     Problem: Chronic Conditions and Co-morbidities  Goal: Patient's chronic conditions and co-morbidity symptoms are monitored and maintained or improved  Outcome: Progressing  Flowsheets (Taken 05/08/2022 2115)  Care Plan - Patient's Chronic Conditions and Co-Morbidity Symptoms are Monitored and Maintained or Improved:   Monitor and assess patient's chronic conditions and comorbid symptoms for stability, deterioration, or improvement   Collaborate with multidisciplinary team to address chronic and comorbid conditions and prevent exacerbation or deterioration   Update acute care plan with appropriate goals if chronic or comorbid symptoms are exacerbated and prevent overall improvement and discharge     Problem: Nutrition Deficit:  Goal: Optimize nutritional status  Outcome: Progressing     Problem: Gastrointestinal - Adult  Goal: Minimal or absence of nausea and vomiting  Outcome: Progressing  Flowsheets (Taken  05/08/2022 2115)  Minimal or absence of nausea and vomiting:   Administer IV fluids as ordered to ensure adequate hydration   Administer ordered antiemetic medications as needed   Provide nonpharmacologic comfort measures as appropriate     Problem: Genitourinary - Adult  Goal: Absence of urinary retention  Outcome: Progressing  Flowsheets (Taken 05/08/2022 2115)  Absence of urinary retention:   Assess patient's ability to void and empty bladder   Monitor intake/output and perform bladder scan as needed     Problem: Hematologic - Adult  Goal: Maintains hematologic stability  Outcome: Progressing  Flowsheets (Taken 05/08/2022 2115)  Maintains hematologic stability:   Assess for signs and symptoms of bleeding or hemorrhage   Monitor labs for bleeding or clotting disorders     Problem: Infection - Adult  Goal: Absence of infection at discharge  Outcome: Progressing  Flowsheets (Taken 05/08/2022 2115)  Absence of infection at discharge:   Assess and monitor for signs and symptoms of infection   Monitor lab/diagnostic results   Monitor all insertion sites i.e., indwelling lines, tubes and drains   Administer medications as ordered     Problem: Pain  Goal: Verbalizes/displays adequate comfort level or baseline comfort level  Outcome: Progressing     Problem: Respiratory - Adult  Goal: Achieves optimal ventilation and oxygenation  Outcome: Progressing  Flowsheets (Taken 05/08/2022 2115)  Achieves optimal ventilation and oxygenation:   Assess for changes in respiratory status   Assess for changes in mentation and behavior     Problem: Neurosensory - Adult  Goal:  Achieves stable or improved neurological status  Outcome: Progressing  Flowsheets  Taken 05/08/2022 2115 by Azucena Fallen, RN  Achieves stable or improved neurological status:   Assess for and report changes in neurological status   Initiate measures to prevent increased intracranial pressure   Maintain blood pressure and fluid volume within ordered parameters to optimize  cerebral perfusion and minimize risk of hemorrhage   Monitor temperature, glucose, and sodium. Initiate appropriate interventions as ordered  Taken 05/08/2022 0930 by Odis Luster, RN  Achieves stable or improved neurological status:   Assess for and report changes in neurological status   Initiate measures to prevent increased intracranial pressure   Monitor temperature, glucose, and sodium. Initiate appropriate interventions as ordered  Goal: Remains free of injury related to seizures activity  Outcome: Progressing  Goal: Achieves maximal functionality and self care  Outcome: Progressing     Problem: Spiritual Care  Goal: Pt/Family able to move forward in process of forgiving self, others, and/or higher power  Description: INTERVENTIONS:  1. Assist patient/family to overcome blocks to healing by use of spiritual practices (prayer, meditation, guided imagery, reiki, breath work, Catering manager).  2. De-myth guilt and help patient/family identify possible irrational spiritual/cultural beliefs and values.  3. Explore possibilities of making amends & reconciliation with self, others, and/or a greater power.  4. Guide patient/family in identifying painful feelings of guilt.  5. Help patient/famiy explore and identify spiritual beliefs, cultural understandings or values that may help or hinder letting go of issue.  6. Help patient/family explore feelings of anger, bitterness, resentment.  7. Help patient/family identify and examine the situation in which these feelings are experienced.  8. Help patient/family identify destructive displacement of feelings onto other individuals.  9. Invite use of sacraments/rituals/ceremonies as appropriate (e.g. - confession, anointing, smudging).  10. Refer patient/family to formal counseling and/or to faith community for further support work.  Outcome: Progressing  Flowsheets  Taken 05/08/2022 2115 by Azucena Fallen, RN  Patient/family able to move forward in process of forgiving self,  others, and/or higher power: Assist patient to overcome blocks to healing by use of spiritual practices (prayer, meditation, guided imagery, reiki, breath work, etc)  Taken 05/08/2022 0930 by Odis Luster, RN  Patient/family able to move forward in process of forgiving self, others, and/or higher power: Assist patient to overcome blocks to healing by use of spiritual practices (prayer, meditation, guided imagery, reiki, breath work, Catering manager)

## 2022-05-09 LAB — CBC WITH AUTO DIFFERENTIAL
Absolute Immature Granulocyte: 0 10*3/uL (ref 0.00–0.04)
Basophils %: 0 % (ref 0–1)
Basophils Absolute: 0 10*3/uL (ref 0.0–0.1)
Eosinophils %: 1 % (ref 0–7)
Eosinophils Absolute: 0.1 10*3/uL (ref 0.0–0.4)
Hematocrit: 24.3 % — ABNORMAL LOW (ref 36.6–50.3)
Hemoglobin: 7.4 g/dL — ABNORMAL LOW (ref 12.1–17.0)
Immature Granulocytes: 0 % (ref 0–0.5)
Lymphocytes %: 23 % (ref 12–49)
Lymphocytes Absolute: 1 10*3/uL (ref 0.8–3.5)
MCH: 28.4 PG (ref 26.0–34.0)
MCHC: 30.5 g/dL (ref 30.0–36.5)
MCV: 93.1 FL (ref 80.0–99.0)
MPV: 9.6 FL (ref 8.9–12.9)
Monocytes %: 9 % (ref 5–13)
Monocytes Absolute: 0.4 10*3/uL (ref 0.0–1.0)
Neutrophils %: 67 % (ref 32–75)
Neutrophils Absolute: 3 10*3/uL (ref 1.8–8.0)
Nucleated RBCs: 0 PER 100 WBC
Platelets: 92 10*3/uL — ABNORMAL LOW (ref 150–400)
RBC: 2.61 M/uL — ABNORMAL LOW (ref 4.10–5.70)
RDW: 24.8 % — ABNORMAL HIGH (ref 11.5–14.5)
WBC: 4.5 10*3/uL (ref 4.1–11.1)
nRBC: 0 10*3/uL (ref 0.00–0.01)

## 2022-05-09 LAB — BASIC METABOLIC PANEL
Anion Gap: 8 mmol/L (ref 5–15)
BUN: 60 mg/dL — ABNORMAL HIGH (ref 6–20)
Bun/Cre Ratio: 9 — ABNORMAL LOW (ref 12–20)
CO2: 19 mmol/L — ABNORMAL LOW (ref 21–32)
Calcium: 7.3 mg/dL — ABNORMAL LOW (ref 8.5–10.1)
Chloride: 119 mmol/L — ABNORMAL HIGH (ref 97–108)
Creatinine: 6.37 mg/dL — ABNORMAL HIGH (ref 0.70–1.30)
Est, Glom Filt Rate: 9 mL/min/{1.73_m2} — ABNORMAL LOW (ref >60)
Glucose: 118 mg/dL — ABNORMAL HIGH (ref 65–100)
Potassium: 3.8 mmol/L (ref 3.5–5.1)
Sodium: 146 mmol/L — ABNORMAL HIGH (ref 136–145)

## 2022-05-09 LAB — C-REACTIVE PROTEIN: CRP: 3.68 mg/dL — ABNORMAL HIGH (ref 0.00–0.60)

## 2022-05-09 LAB — PROCALCITONIN: Procalcitonin: 0.97 ng/mL — ABNORMAL HIGH

## 2022-05-09 MED ORDER — FIDAXOMICIN 200 MG PO TABS
200 | Freq: Two times a day (BID) | ORAL | Status: DC
Start: 2022-05-09 — End: 2022-05-10
  Administered 2022-05-09 – 2022-05-10 (×2): 200 mg via ORAL

## 2022-05-09 MED ORDER — MELATONIN 3 MG PO TABS
3 | Freq: Every evening | ORAL | Status: DC | PRN
Start: 2022-05-09 — End: 2022-05-10

## 2022-05-09 MED ORDER — RIFAXIMIN 200 MG PO TABS
200 MG | Freq: Three times a day (TID) | ORAL | Status: AC
Start: 2022-05-09 — End: 2022-05-10
  Administered 2022-05-09 – 2022-05-10 (×2): 400 mg via ORAL

## 2022-05-09 MED FILL — SODIUM BICARBONATE 650 MG PO TABS: 650 MG | ORAL | Qty: 1

## 2022-05-09 MED FILL — CAL-GEST ANTACID 500 MG PO CHEW: 500 MG | ORAL | Qty: 1

## 2022-05-09 MED FILL — METRONIDAZOLE 500 MG/100ML IV SOLN: 500 MG/100ML | INTRAVENOUS | Qty: 100

## 2022-05-09 MED FILL — ACIDOPHILUS/CITRUS PECTIN PO TABS: ORAL | Qty: 1

## 2022-05-09 MED FILL — ONDANSETRON HCL 4 MG/2ML IJ SOLN: 4 MG/2ML | INTRAMUSCULAR | Qty: 2

## 2022-05-09 MED FILL — XIFAXAN 200 MG PO TABS: 200 MG | ORAL | Qty: 2

## 2022-05-09 MED FILL — LOPERAMIDE HCL 2 MG PO CAPS: 2 MG | ORAL | Qty: 1

## 2022-05-09 MED FILL — DEXTROSE-NACL 5-0.45 % IV SOLN: INTRAVENOUS | Qty: 1000

## 2022-05-09 MED FILL — CHOLESTYRAMINE LIGHT 4 G PO PACK: 4 g | ORAL | Qty: 1

## 2022-05-09 MED FILL — TRAZODONE HCL 50 MG PO TABS: 50 MG | ORAL | Qty: 1

## 2022-05-09 MED FILL — DIFICID 200 MG PO TABS: 200 MG | ORAL | Qty: 1

## 2022-05-09 NOTE — Anesthesia Procedure Notes (Signed)
Airway  Date/Time: 05/09/2022 11:54 PM  Urgency: emergent    Airway not difficult    General Information and Staff    Patient location during procedure: patient floor  Resident/CRNA: Jonelle Sidle, APRN - CRNA  Performed by: Jonelle Sidle, APRN - CRNA  Authorized by: Jonelle Sidle, APRN - CRNA      Consent for Airway (if performed for an anesthetic, see related documentation for consents)  Patient identity confirmed: per hospital policy  Consent: Written consent not obtained.  Emergent situation: Code Blue.Risks and benefits: risks, benefits and alternatives were not discussed  Consent given by: Code Blue.    no  Indications and Patient Condition  Indications for airway management: cardio/pulmonary arrest  Spontaneous Ventilation: absent  Sedation level: no sedation  Preoxygenated: yes  Patient position: sniffing  MILS not maintained throughout  Mask difficulty assessment: vent by bag mask    Final Airway Details  Final airway type: endotracheal airway      Successful airway: ETT  Cuffed: yes   Successful intubation technique: direct laryngoscopy  Endotracheal tube insertion site: oral  Blade: Miller  Blade size: #2  ETT size (mm): 8.0  Cormack-Lehane Classification: grade I - full view of glottis  Placement verified by: chest auscultation and capnometry   Inital cuff pressure (cm H2O): 24  Measured from: gums  Number of attempts at approach: 1    Additional Comments  ETT secured with special tape by the respiratory team.    Non-anticipated difficult airway: yes

## 2022-05-09 NOTE — Progress Notes (Signed)
Infectious Disease Progress Note          Subjective:   More alert and following commands, flexiseal remains in place, w no reports of increased out put. Afebrile, denies abdominal pain   Objective:   Physical Exam:     BP 118/61   Pulse 95   Temp 99.3 F (37.4 C) (Oral)   Resp 19   Ht 5' 10.98" (1.803 m)   Wt 197 lb 8.5 oz (89.6 kg)   SpO2 100%   BMI 27.56 kg/m    O2 Device: None (Room air)    Temp (24hrs), Avg:98.3 F (36.8 C), Min:97.9 F (36.6 C), Max:99.3 F (37.4 C)    09/10 0701 - 09/10 1900  In: -   Out: 100 [Urine:100]   09/08 1901 - 09/10 0700  In: 600 [P.O.:600]  Out: 250 [Urine:250]    General: NAD, AAO x 4  HEENT: PERLA, Moist mucosa   Lungs: decreased at the bases, no wheeze/rhonchi   Heart: S1S2+, RRR, no murmur  Abdo: Soft, NT, distended, NT, +BS   GU: indwelling foley cath   Exts: +2 pitting edema, + pulses b/l   Skin: Excoriation in the perineum, no sacral ulcer     Data Review:       Recent Days:    Recent Labs     05/07/22  0430 05/09/22  0524   WBC 5.5 4.5   HGB 7.3* 7.4*   HCT 23.4* 24.3*   PLT 95* 92*       Recent Labs     05/07/22  0430 05/08/22  0556 05/09/22  0524   BUN 53* 59* 60*   CREATININE 6.24* 6.33* 6.37*         Lab Results   Component Value Date/Time    CRP 3.68 05/09/2022 05:24 AM        Microbiology     Results       Procedure Component Value Units Date/Time    Culture, Urine [[id number]] Collected: 05/01/22 1545    Order Status: Completed Specimen: Urine Updated: 05/04/22 1225     Special Requests No Special Requests        Colony count 20,000        Colony count colonies/ml        Culture       Mixed urogenital flora isolated          Clostridium Difficile Toxin/Antigen [[id number]]  (Abnormal) Collected: 2022/05/11 1215    Order Status: Completed Specimen: Stool Updated: May 11, 2022 1511     [facility] Antigen Positive        C difficile Toxin, EIA Positive        Comment: Results verified, phoned to and read back by Joellyn Rued RN AT 1510 ON [id number] [facility]         C Diff Toxin Interpretation       Positive for toxigenic C. difficile                   Diagnostic   CT abdo/pel 09/06  1. Diffuse colonic wall thickening predominantly the ascending and transverse  colon. Findings may represent the patient's C. diff colitis. Is no evidence of  toxic megacolon or obstruction.     2.  Moderate to large volume ascites. Cirrhosis. Splenomegaly.     3.  Moderate bilateral pleural effusions.    Assessment/Plan     Severe C.diff infection. Diffuse colonic wall thickening predominantly the ascending and transverse  Colon, w/o evidence of  toxic megacolon on CT (09/06)         Flexiseal remains in place, afebrile w a normal WBC on routine labs         S/p 2 wks of Metronidazole, and 10 days of dificid         Will d/c Metronidazole and taper dificid, continue probiotics, Add Rifaximin         Repeat stool for C.diff next wk if IC allows. Routine labs in AM     2. Mucocutaneous candidiasis involving perineum, continue topical antifungal therapy       Continue foley and FMS given severe skin break down     3. Liver cirrhosis w Moderate to large volume ascites on CT      Abdomen remains distended but pt is symptomatic     4. AKI, oliguric, foley in place, Cr staying stable on serial labs          Jene Every, MD    05/09/2022

## 2022-05-09 NOTE — Progress Notes (Signed)
General Daily Progress Note      Patient Name:   Carlos Petersen       Date of Birth:   1950/12/24       Age:  71 y.o.      Admit Date: 04/25/2022      Subjective:         Patient complaining of insomnia             Objective:     Vitals:    05/09/22 0751   BP: 118/61   Pulse: 95   Resp: 19   Temp: 99.3 F (37.4 C)   SpO2: 100%        Recent Results (from the past 24 hour(s))   Basic Metabolic Panel    Collection Time: 05/09/22  5:24 AM   Result Value Ref Range    Sodium 146 (H) 136 - 145 mmol/L    Potassium 3.8 3.5 - 5.1 mmol/L    Chloride 119 (H) 97 - 108 mmol/L    CO2 19 (L) 21 - 32 mmol/L    Anion Gap 8 5 - 15 mmol/L    Glucose 118 (H) 65 - 100 mg/dL    BUN 60 (H) 6 - 20 mg/dL    Creatinine 3.32 (H) 0.70 - 1.30 mg/dL    Bun/Cre Ratio 9 (L) 12 - 20      Est, Glom Filt Rate 9 (L) >60 ml/min/1.80m2    Calcium 7.3 (L) 8.5 - 10.1 mg/dL   CBC with Auto Differential    Collection Time: 05/09/22  5:24 AM   Result Value Ref Range    WBC 4.5 4.1 - 11.1 K/uL    RBC 2.61 (L) 4.10 - 5.70 M/uL    Hemoglobin 7.4 (L) 12.1 - 17.0 g/dL    Hematocrit 95.1 (L) 36.6 - 50.3 %    MCV 93.1 80.0 - 99.0 FL    MCH 28.4 26.0 - 34.0 PG    MCHC 30.5 30.0 - 36.5 g/dL    RDW 88.4 (H) 16.6 - 14.5 %    Platelets 92 (L) 150 - 400 K/uL    MPV 9.6 8.9 - 12.9 FL    Nucleated RBCs 0.0 0.0 PER 100 WBC    nRBC 0.00 0.00 - 0.01 K/uL    Neutrophils % 67 32 - 75 %    Lymphocytes % 23 12 - 49 %    Monocytes % 9 5 - 13 %    Eosinophils % 1 0 - 7 %    Basophils % 0 0 - 1 %    Immature Granulocytes 0 0 - 0.5 %    Neutrophils Absolute 3.0 1.8 - 8.0 K/UL    Lymphocytes Absolute 1.0 0.8 - 3.5 K/UL    Monocytes Absolute 0.4 0.0 - 1.0 K/UL    Eosinophils Absolute 0.1 0.0 - 0.4 K/UL    Basophils Absolute 0.0 0.0 - 0.1 K/UL    Absolute Immature Granulocyte 0.0 0.00 - 0.04 K/UL    Differential Type AUTOMATED     C-Reactive Protein    Collection Time: 05/09/22  5:24 AM   Result Value Ref Range    CRP 3.68 (H) 0.00 - 0.60 mg/dL   Procalcitonin    Collection Time:  05/09/22  5:24 AM   Result Value Ref Range    Procalcitonin 0.97 (H) 0 ng/mL     @LABCOMPINFO @        Review of Systems    Constitutional: Negative for chills and fever.  HENT: Negative.    Eyes: Negative.    Respiratory: Negative.    Cardiovascular: Negative.    Gastrointestinal: Negative for abdominal pain and nausea.   Skin: Negative.    Neurological: Negative.        Physical Exam:      Constitutional: pt is oriented to person, place, and time.   HENT:   Head: Normocephalic and atraumatic.   Eyes: Pupils are equal, round, and reactive to light. EOM are normal.   Cardiovascular: Normal rate, regular rhythm and normal heart sounds.   Pulmonary/Chest: Breath sounds normal. No wheezes. No rales.   Exhibits no tenderness.   Abdominal: Soft. Bowel sounds are normal. There is no abdominal tenderness. There is no rebound and no guarding.   Musculoskeletal: Normal range of motion.   Neurological: pt is alert and oriented to person, place, and time.     XR CHEST PORTABLE   Final Result      No acute process.         CT ABDOMEN PELVIS WO CONTRAST Additional Contrast? None   Final Result   1.  Diffuse colonic wall thickening predominantly the ascending and transverse   colon. Findings may represent the patient's C. diff colitis. Is no evidence of   toxic megacolon or obstruction.      2.  Moderate to large volume ascites. Cirrhosis. Splenomegaly.      3.  Moderate bilateral pleural effusions.      XR CHEST PORTABLE   Final Result      Small right pleural effusion with underlying atelectasis.         XR ABDOMEN (2 VIEWS)   Final Result      1.  No evidence of ileus or obstruction.   2.  Small right and trace left pleural effusions.          [place] RETROPERITONEAL COMPLETE   Final Result   1.  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   2.  Layering debris in the bladder, correlate with urinalysis.   3.  Splenomegaly.   4.  Small volume ascites and right pleural effusion.          XR CHEST PORTABLE   Final Result      Small  right pleural effusion with underlying atelectasis.         CT ABDOMEN PELVIS WO CONTRAST Additional Contrast? None   Final Result      1. Diffuse colitis.   2. 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   3. New mild to moderate right pleural effusion with bibasilar atelectasis. New   14 mm left lower lobe nodular lung opacity. Recommend follow-up in 3 months.   4. Refer to above findings for complete details.            Recent Results (from the past 24 hour(s))   Basic Metabolic Panel    Collection Time: 05/09/22  5:24 AM   Result Value Ref Range    Sodium 146 (H) 136 - 145 mmol/L    Potassium 3.8 3.5 - 5.1 mmol/L    Chloride 119 (H) 97 - 108 mmol/L    CO2 19 (L) 21 - 32 mmol/L    Anion Gap 8 5 - 15 mmol/L    Glucose 118 (H) 65 - 100 mg/dL    BUN 60 (H) 6 - 20 mg/dL    Creatinine 9.74 (H) 0.70 - 1.30 mg/dL    Bun/Cre Ratio 9 (L) 12 - 20  Est, Glom Filt Rate 9 (L) >60 ml/min/1.3273m2    Calcium 7.3 (L) 8.5 - 10.1 mg/dL   CBC with Auto Differential    Collection Time: 05/09/22  5:24 AM   Result Value Ref Range    WBC 4.5 4.1 - 11.1 K/uL    RBC 2.61 (L) 4.10 - 5.70 M/uL    Hemoglobin 7.4 (L) 12.1 - 17.0 g/dL    Hematocrit 57.824.3 (L) 36.6 - 50.3 %    MCV 93.1 80.0 - 99.0 FL    MCH 28.4 26.0 - 34.0 PG    MCHC 30.5 30.0 - 36.5 g/dL    RDW 46.924.8 (H) 62.911.5 - 14.5 %    Platelets 92 (L) 150 - 400 K/uL    MPV 9.6 8.9 - 12.9 FL    Nucleated RBCs 0.0 0.0 PER 100 WBC    nRBC 0.00 0.00 - 0.01 K/uL    Neutrophils % 67 32 - 75 %    Lymphocytes % 23 12 - 49 %    Monocytes % 9 5 - 13 %    Eosinophils % 1 0 - 7 %    Basophils % 0 0 - 1 %    Immature Granulocytes 0 0 - 0.5 %    Neutrophils Absolute 3.0 1.8 - 8.0 K/UL    Lymphocytes Absolute 1.0 0.8 - 3.5 K/UL    Monocytes Absolute 0.4 0.0 - 1.0 K/UL    Eosinophils Absolute 0.1 0.0 - 0.4 K/UL    Basophils Absolute 0.0 0.0 - 0.1 K/UL    Absolute Immature Granulocyte 0.0 0.00 - 0.04 K/UL    Differential Type AUTOMATED     C-Reactive Protein    Collection Time: 05/09/22  5:24 AM   Result  Value Ref Range    CRP 3.68 (H) 0.00 - 0.60 mg/dL   Procalcitonin    Collection Time: 05/09/22  5:24 AM   Result Value Ref Range    Procalcitonin 0.97 (H) 0 ng/mL       Results       Procedure Component Value Units Date/Time    Culture, Urine [[id number]][[id number] [id number]] Collected: 05/01/22 1545    Order Status: Completed Specimen: Urine Updated: 05/04/22 1225     Special Requests No Special Requests        Colony count 20,000        Colony count colonies/ml        Culture       Mixed urogenital flora isolated          Clostridium Difficile Toxin/Antigen [[id number]][[id number]]  (Abnormal) Collected: Jul 18, 2022 1215    Order Status: Completed Specimen: Stool Updated: Jul 18, 2022 1511     GDH Antigen Positive        C difficile Toxin, EIA Positive        Comment: Results verified, phoned to and read back by Joellyn RuedELOISE FARRELL RN AT 1510 ON [id number] [facility]        C Diff Toxin Interpretation       Positive for toxigenic C. difficile                   No results found.       Labs:     Recent Labs     05/07/22  0430 05/09/22  0524   WBC 5.5 4.5   HGB 7.3* 7.4*   HCT 23.4* 24.3*   PLT 95* 92*       Recent Labs     05/07/22  0430 05/08/22  0556 05/09/22  0524   NA 143 146* 146*   K 3.7 3.8 3.8   CL 117* 121* 119*   CO2 19* 16* 19*   BUN 53* 59* 60*   CREATININE 6.24* 6.33* 6.37*   GLUCOSE 117* 85 118*   CALCIUM 7.5* 7.4* 7.3*   MG 1.9  --   --    PHOS 3.0  --   --        Recent Labs     05/07/22  0430   LABALBU 2.4*       No results for input(s): "INR", "PROTIME", "APTT" in the last 72 hours.   No results for input(s): "IRON", "TIBC", "FERRITIN" in the last 72 hours.    Invalid input(s): "PSAT"   No results found for: "VITAMINB12", "FOLATE", "RBCF"   No results for input(s): "PH", "PCO2", "PO2" in the last 72 hours.  No results for input(s): "CKMB", "CKMBINDEX", "TROPHS" in the last 72 hours.    Invalid input(s): "TOTALCK", "TROPININ"  Lab Results   Component Value Date/Time    CHOL 134 01/07/2021 11:00 AM    TRIG 199 01/07/2021 11:00 AM    HDL 34 01/07/2021  11:00 AM    LDLCALC 67 01/07/2021 11:00 AM    VLDL 33 01/07/2021 11:00 AM     Lab Results   Component Value Date/Time    POCGLU 125 05/06/2022 08:56 PM    POCGLU 107 05/05/2022 08:42 PM    POCGLU 112 05/04/2022 08:43 PM    POCGLU 133 05/04/2022 12:30 PM    POCGLU 129 01/31/2022 12:33 PM     Lab Results   Component Value Date/Time    COLORU Amber 08-May-2022 06:10 AM    COLORU Amber 2022-05-08 06:10 AM    CLARITYU Clear 12/17/2021 09:31 AM    GLUCOSEU Negative May 08, 2022 06:10 AM    GLUCOSEU Negative 05-08-2022 06:10 AM    BILIRUBINUR Negative 2022-05-08 06:10 AM    BILIRUBINUR Negative 05-08-2022 06:10 AM    BILIRUBINUR Negative 12/17/2021 09:31 AM    KETUA 5 May 08, 2022 06:10 AM    KETUA Negative 05/08/2022 06:10 AM    SPECGRAV 1.014 05/08/22 06:10 AM    SPECGRAV 1.014 May 08, 2022 06:10 AM    BLOODU Large 05-08-22 06:10 AM    BLOODU Large May 08, 2022 06:10 AM    PHUR 6.0 12/17/2021 09:31 AM    PROTEINU 100 May 08, 2022 06:10 AM    PROTEINU 100 May 08, 2022 06:10 AM    NITRU Negative 05/08/2022 06:10 AM    NITRU Negative 05/08/22 06:10 AM    LEUKOCYTESUR Negative 05/08/2022 06:10 AM    LEUKOCYTESUR Negative 2022-05-08 06:10 AM         Assessment:     Severe C. difficile colitis infection  Mucocutaneous candidiasis  Cirrhosis of liver  Acute kidney injury  Chronic anemia  Thrombocytopenia  Insomnia      Plan:     Symbicort 2 puff twice daily  Questran 4 g 3 times a day  Imodium 2 mg 3 times a day  Flagyl 500 every 8 hours  Sodium bicarb 650 mg 3 times a day  Trazodone 50 mg at night  Melatonin 6 mg nightly    Patient and potassium sodium bicarb dextrose drip    Follow-up with nephrology and infectious disease        Current Facility-Administered Medications:     melatonin tablet 6 mg, 6 mg, Oral, Nightly PRN, Glade Lloyd Kartier Bennison, MD    loperamide (IMODIUM) capsule 2 mg, 2 mg, Oral, TID, Gwendolin Tangham  V, MD, 2 mg at 05/09/22 0813    potassium chloride 20 mEq, sodium bicarbonate 50 mEq in dextrose 5 % and 0.45 % NaCl  1,000 mL infusion, , IntraVENous, Continuous, Coral Spikes, MD, Last Rate: 100 mL/hr at 05/09/22 0427, New Bag at 05/09/22 0427    ipratropium 0.5 mg-albuterol 2.5 mg (DUONEB) nebulizer solution 1 Dose, 1 Dose, Inhalation, Q4H PRN, Blake Divine, MD, 1 Dose at 05/05/22 2119    budesonide-formoterol (SYMBICORT) 160-4.5 MCG/ACT inhaler 2 puff, 2 puff, Inhalation, BID RT, Blake Divine, MD, 2 puff at 05/08/22 2135    sodium bicarbonate tablet 650 mg, 650 mg, Oral, TID, Rudene Re, MD, 650 mg at 05/09/22 0813    0.9 % sodium chloride infusion, , IntraVENous, PRN, Blake Divine, MD    cholestyramine light packet 4 g, 4 g, Oral, TID, Blake Divine, MD, 4 g at 05/08/22 2016    calcium carbonate (TUMS) chewable tablet 500 mg, 500 mg, Oral, TID, Rudene Re, MD, 500 mg at 05/09/22 0813    traZODone (DESYREL) tablet 50 mg, 50 mg, Oral, Nightly, Blake Divine, MD, 50 mg at 05/08/22 2016    acidophilus/citrus pectin 1 tablet, 1 tablet, Oral, Daily, Blake Divine, MD, 1 tablet at 05/09/22 0813    sodium chloride flush 0.9 % injection 5-40 mL, 5-40 mL, IntraVENous, 2 times per day, Blake Divine, MD, 10 mL at 05/09/22 4715    sodium chloride flush 0.9 % injection 5-40 mL, 5-40 mL, IntraVENous, PRN, Blake Divine, MD    0.9 % sodium chloride infusion, , IntraVENous, PRN, Blake Divine, MD    ondansetron (ZOFRAN-ODT) disintegrating tablet 4 mg, 4 mg, Oral, Q8H PRN, 4 mg at 05/02/22 1211 **OR** ondansetron (ZOFRAN) injection 4 mg, 4 mg, IntraVENous, Q6H PRN, Blake Divine, MD, 4 mg at 05/09/22 9539    acetaminophen (TYLENOL) tablet 650 mg, 650 mg, Oral, Q6H PRN, 650 mg at 05/04/22 2155 **OR** acetaminophen (TYLENOL) suppository 650 mg, 650 mg, Rectal, Q6H PRN, Blake Divine, MD    metronidazole (FLAGYL) 500 mg in 0.9% NaCl 100 mL IVPB premix, 500 mg, IntraVENous, Q8H, Blake Divine, MD, Stopped at 05/09/22 [phone]

## 2022-05-10 LAB — POCT GLUCOSE: POC Glucose: 151 mg/dL — ABNORMAL HIGH (ref 65–100)

## 2022-05-10 MED ORDER — CALCIUM CHLORIDE 10 % IV SOLN
10 % | Freq: Once | INTRAVENOUS | Status: AC | PRN
Start: 2022-05-10 — End: 2022-05-09
  Administered 2022-05-10: 04:00:00 1000 via INTRAVENOUS

## 2022-05-10 MED ORDER — EPINEPHRINE 1 MG/10ML IJ SOSY
1 MG/0ML | Freq: Once | INTRAMUSCULAR | Status: AC | PRN
Start: 2022-05-10 — End: 2022-05-09
  Administered 2022-05-10: 04:00:00 1 via INTRAVENOUS

## 2022-05-10 MED ORDER — SODIUM BICARBONATE 8.4 % IV SOLN
8.4 % | Freq: Once | INTRAVENOUS | Status: AC | PRN
Start: 2022-05-10 — End: 2022-05-09
  Administered 2022-05-10: 04:00:00 50 via INTRAVENOUS

## 2022-05-10 MED FILL — SODIUM BICARBONATE 650 MG PO TABS: 650 MG | ORAL | Qty: 1

## 2022-05-10 MED FILL — DIFICID 200 MG PO TABS: 200 MG | ORAL | Qty: 1

## 2022-05-10 MED FILL — XIFAXAN 200 MG PO TABS: 200 MG | ORAL | Qty: 2

## 2022-05-10 MED FILL — TRAZODONE HCL 50 MG PO TABS: 50 MG | ORAL | Qty: 1

## 2022-05-10 MED FILL — BUDESONIDE-FORMOTEROL FUMARATE 160-4.5 MCG/ACT IN AERO: RESPIRATORY_TRACT | Qty: 0.2

## 2022-05-10 MED FILL — ACIDOPHILUS/CITRUS PECTIN PO TABS: ORAL | Qty: 1

## 2022-05-10 MED FILL — CAL-GEST ANTACID 500 MG PO CHEW: 500 MG | ORAL | Qty: 1

## 2022-05-10 NOTE — Discharge Summary (Signed)
Discharge Summary    Carlos Petersen  DOB:  July 30, 1951  MRN:  [id number]    ADMIT DATE:  04/25/2022  DISCHARGE DATE:  05/16/2022    PRIMARY CARE PHYSICIAN:  Herma Mering, MD    VISIT STATUS: Admission    CODE STATUS:  Prior    DISCHARGE DIAGNOSES:  Principal Problem:    Acute kidney injury (AKI) with acute tubular necrosis (ATN) (HCC)  Active Problems:    Acute kidney injury (HCC)    Clostridioides difficile infection    Colitis    Hypokalemia  Resolved Problems:    * No resolved hospital problems. *    Cirrhosis  Severe c difficille colitis    HOSPITAL COURSE:  71 years old past history of COPD presented to the diabetes, neuropathy high cholesterol presented with a complaint of not feeling well.  Admitted for acute kidney injury, acute renal necrosis, seen hypokalemia, hypocalcemia, severe malnutrition, anemia, possible UTI  Ideally, nephrology consult was obtained and patient was seen by the infectious disease doctor and nephrologist as well as the GI physician CT of the abdomen pelvis showed diffuse colitis, cirrhosis mild to moderate) effusion with atelectasis with a new 14 mm left lower lobe nodule.  Patient was started on Dificid    Transfer to ICU with discharge on condition    Albumin infusion due to elevated and subsequently was made a DNR and    SIGNIFICANT DIAGNOSTIC STUDIES:  CT of the abdomen  CONSULTANTS:  Nephrology  Infectious disease   RECOMMENDED NEXT STEPS:        DISCHARGE MEDICATIONS:         Medication List        ASK your doctor about these medications      albuterol sulfate HFA 108 (90 Base) MCG/ACT inhaler  Commonly known as: PROVENTIL;VENTOLIN;PROAIR     alendronate 70 MG tablet  Commonly known as: FOSAMAX     amiodarone 200 MG tablet  Commonly known as: CORDARONE     aspirin 81 MG EC tablet  Take 1 tablet by mouth daily     carbidopa-levodopa 50-200 MG per extended release tablet  Commonly known as: SINEMET CR     cholestyramine 4 g packet  Commonly known as: Questran  Take 1 packet by  mouth 3 times daily (with meals) for 7 days     ferrous sulfate 325 (65 Fe) MG tablet  Commonly known as: IRON 325  Take 1 tablet by mouth daily (with breakfast)     gabapentin 100 MG capsule  Commonly known as: NEURONTIN     Jardiance 10 MG tablet  Generic drug: empagliflozin     midodrine 10 MG tablet  Commonly known as: PROAMATINE  Take 1 tablet by mouth 3 times daily (with meals)     omeprazole 40 MG delayed release capsule  Commonly known as: PRILOSEC     phosphorus 155-852-130 MG tablet  Commonly known as: K PHOS NEUTRAL  Take 2 tablets by mouth 4 times daily for 5 days     rosuvastatin 20 MG tablet  Commonly known as: CRESTOR     Trelegy Ellipta 100-62.5-25 MCG/ACT Aepb inhaler  Generic drug: fluticasone-umeclidin-vilant     VORTIoxetine HBr 20 MG Tabs tablet  Commonly known as: TRINTELLIX              DIET: No diet orders on file    ACTIVITY:  Expred        ______________________________________________________________________  COMPLEXITY OF FOLLOW UP:   []   Moderate Complexity: follow up within 7-14 calendar days (26834)   []  Severe Complexity: follow up within 7 calendar days )    FOLLOW UP TESTING, PENDING RESULTS OR REFERRALS AT TRANSITIONAL CARE VISIT:   []   Yes    []   No    PENDING STUDIES:       DISPOSITION: Expired    FACILITY/HOME CARE AGENCY NAME:     Follow up with No follow-up provider specified. on expired    INSTRUCTIONS TO MA/SW: Please call patient on day after discharge (must document patient  contacted within 2 business days of discharge).    FOLLOW UP QUESTIONS FOR MA/SW:  1. Did you get medications filled and taking them as instructed from discharge?  2. Are you following your discharge instructions from your hospital stay?  3. Please confirm patient is scheduled for a follow up appointment within the above time frame.    DISCHARGE TIME: > 30 minutes    SIGNED:  ([id number], MD   05/16/2022, 4:30 PM

## 2022-05-10 NOTE — Progress Notes (Signed)
At 2000 staff informed writer that tele staff had called twice regarding patient's heart rate;  went in room to evaluate patient and he stated he was okay he just needed to be cleaned up and he was becoming restless;  informed him writer would get the supplies needed and would returned;  Tele staff was informed  of this;  at approximately 2015 staff entered the room and stated tele just called a rapid  response  on him; heart rate was in the 170,s; writer was in the middle of  cleaning patient up at that time;  once hygeine care was done pt took meds and made comfortable and asked if there was anything else I could do and he stated no and that he was fine.

## 2022-05-10 NOTE — Progress Notes (Signed)
0011: Patient came in from the floor (2 [place]) on continuous ambu bagging. Patient was intubated and coded on the floor. Hooked to cardiac monitor, which shows asystole. Patient is on DNR.     0012 pulse and heart rate checked, pronounced dead by Kennyth Arnold RN and the Clinical research associate. Son arrived at the bedside and received personal belongings including teeth, cellphone and Consulting civil engineer.     8850: called [facility], spoke to Godfrey Pick. Post mortem done.    0150: Patient brought down to morgue.

## 2022-05-10 NOTE — Progress Notes (Signed)
2342 telemetry called and alerted Charge RN that pt was in asystole.   2343 CPR was initiated and a Code blue was called. During the code 4 rounds of epi was given, 1g cal was given, pt was shocked at 2353.   2358 pulse was detected   0001 BP 59/40  0002 pt transferred to ICU.    Code blue narrator was completed in flowsheets.

## 2022-05-10 NOTE — Progress Notes (Signed)
59 Son at bedside. Son stated pt wanted his body donated to science. Explained to pt that we will call and see if he would be accepted and let him know. Looked up the number for [employer] and looked at the criteria for acceptance. Pt was +C Diff which excludes him from being accepted. Informed son Isaid Salvia [phone].  Gave the son the nursing supervisor number to call when he had made a decision about funeral home.

## 2022-05-12 LAB — HEPATITIS SCREENING PANEL
HEP B SURF AB: NONREACTIVE Index Value
Hep A IgM: NEGATIVE
Hep A Total Ab: NEGATIVE
Hep B Core Ab, IgM: NEGATIVE
Hep B Core Total Ab: NEGATIVE
Hepatitis B Surface Ag: NEGATIVE
Hepatitis C Ab: NONREACTIVE

## 2022-05-12 LAB — HCV INTERPRETATION
